# Patient Record
Sex: Male | Born: 1940
Health system: Southern US, Community
[De-identification: ages and names within clinical notes are randomized; demographics above are authoritative.]

## PROBLEM LIST (undated history)

## (undated) DIAGNOSIS — I4891 Unspecified atrial fibrillation: Secondary | ICD-10-CM

## (undated) DIAGNOSIS — J449 Chronic obstructive pulmonary disease, unspecified: Secondary | ICD-10-CM

## (undated) DIAGNOSIS — E785 Hyperlipidemia, unspecified: Secondary | ICD-10-CM

## (undated) DIAGNOSIS — I2510 Atherosclerotic heart disease of native coronary artery without angina pectoris: Secondary | ICD-10-CM

## (undated) DIAGNOSIS — N200 Calculus of kidney: Secondary | ICD-10-CM

## (undated) DIAGNOSIS — R251 Tremor, unspecified: Secondary | ICD-10-CM

## (undated) DIAGNOSIS — I6529 Occlusion and stenosis of unspecified carotid artery: Secondary | ICD-10-CM

## (undated) DIAGNOSIS — Z87891 Personal history of nicotine dependence: Secondary | ICD-10-CM

## (undated) DIAGNOSIS — D649 Anemia, unspecified: Secondary | ICD-10-CM

## (undated) DIAGNOSIS — I509 Heart failure, unspecified: Secondary | ICD-10-CM

## (undated) HISTORY — DX: Hyperlipidemia, unspecified: E78.5

## (undated) HISTORY — DX: Atherosclerotic heart disease of native coronary artery without angina pectoris: I25.10

## (undated) HISTORY — PX: TONSILLECTOMY: SUR1361

## (undated) HISTORY — DX: Unspecified atrial fibrillation: I48.91

## (undated) HISTORY — DX: Occlusion and stenosis of unspecified carotid artery: I65.29

## (undated) HISTORY — DX: Heart failure, unspecified: I50.9

## (undated) HISTORY — DX: Personal history of nicotine dependence: Z87.891

## (undated) HISTORY — DX: Anemia, unspecified: D64.9

## (undated) HISTORY — DX: Chronic obstructive pulmonary disease, unspecified: J44.9

## (undated) HISTORY — PX: OTHER SURGICAL HISTORY: SHX169

## (undated) HISTORY — DX: Tremor, unspecified: R25.1

## (undated) HISTORY — DX: Calculus of kidney: N20.0

---

## 2000-02-25 DIAGNOSIS — I509 Heart failure, unspecified: Secondary | ICD-10-CM

## 2000-02-25 HISTORY — DX: Heart failure, unspecified: I50.9

## 2000-12-08 ENCOUNTER — Encounter: Payer: Self-pay | Admitting: Emergency Medicine

## 2000-12-08 ENCOUNTER — Inpatient Hospital Stay (HOSPITAL_COMMUNITY): Admission: EM | Admit: 2000-12-08 | Discharge: 2000-12-14 | Payer: Self-pay | Admitting: *Deleted

## 2003-07-27 ENCOUNTER — Ambulatory Visit (HOSPITAL_COMMUNITY): Admission: RE | Admit: 2003-07-27 | Discharge: 2003-07-27 | Payer: Self-pay | Admitting: Gastroenterology

## 2004-01-03 ENCOUNTER — Ambulatory Visit: Payer: Self-pay | Admitting: *Deleted

## 2004-02-21 ENCOUNTER — Ambulatory Visit: Payer: Self-pay | Admitting: Internal Medicine

## 2004-03-06 ENCOUNTER — Ambulatory Visit: Payer: Self-pay | Admitting: Family Medicine

## 2004-04-11 ENCOUNTER — Ambulatory Visit: Payer: Self-pay | Admitting: *Deleted

## 2004-05-09 ENCOUNTER — Ambulatory Visit: Payer: Self-pay | Admitting: Cardiology

## 2004-05-23 ENCOUNTER — Ambulatory Visit: Payer: Self-pay | Admitting: Internal Medicine

## 2004-06-10 ENCOUNTER — Ambulatory Visit: Payer: Self-pay | Admitting: *Deleted

## 2004-06-13 ENCOUNTER — Ambulatory Visit: Payer: Self-pay | Admitting: Cardiology

## 2004-07-01 ENCOUNTER — Ambulatory Visit: Payer: Self-pay | Admitting: *Deleted

## 2004-07-25 ENCOUNTER — Ambulatory Visit: Payer: Self-pay | Admitting: Internal Medicine

## 2004-07-25 ENCOUNTER — Ambulatory Visit: Payer: Self-pay

## 2004-08-15 ENCOUNTER — Ambulatory Visit: Payer: Self-pay | Admitting: Family Medicine

## 2004-08-22 ENCOUNTER — Ambulatory Visit: Payer: Self-pay | Admitting: Cardiology

## 2004-10-24 ENCOUNTER — Ambulatory Visit: Payer: Self-pay | Admitting: Internal Medicine

## 2004-11-21 ENCOUNTER — Ambulatory Visit: Payer: Self-pay | Admitting: Cardiology

## 2004-12-19 ENCOUNTER — Ambulatory Visit: Payer: Self-pay | Admitting: Cardiology

## 2005-01-28 ENCOUNTER — Ambulatory Visit: Payer: Self-pay | Admitting: Cardiovascular Disease

## 2005-02-13 ENCOUNTER — Ambulatory Visit: Payer: Self-pay | Admitting: Cardiology

## 2005-03-13 ENCOUNTER — Ambulatory Visit: Payer: Self-pay | Admitting: Cardiology

## 2005-04-10 ENCOUNTER — Ambulatory Visit: Payer: Self-pay | Admitting: Cardiology

## 2005-05-08 ENCOUNTER — Ambulatory Visit: Payer: Self-pay | Admitting: Cardiology

## 2005-06-01 ENCOUNTER — Emergency Department (HOSPITAL_COMMUNITY): Admission: EM | Admit: 2005-06-01 | Discharge: 2005-06-01 | Payer: Self-pay | Admitting: Emergency Medicine

## 2005-06-05 ENCOUNTER — Ambulatory Visit: Payer: Self-pay | Admitting: Cardiology

## 2005-07-07 ENCOUNTER — Ambulatory Visit: Payer: Self-pay | Admitting: Cardiology

## 2005-08-04 ENCOUNTER — Ambulatory Visit: Payer: Self-pay | Admitting: Cardiology

## 2005-09-03 ENCOUNTER — Ambulatory Visit: Payer: Self-pay | Admitting: Cardiology

## 2005-09-04 ENCOUNTER — Ambulatory Visit: Payer: Self-pay | Admitting: Cardiology

## 2005-09-30 ENCOUNTER — Ambulatory Visit: Payer: Self-pay | Admitting: Cardiology

## 2005-09-30 ENCOUNTER — Ambulatory Visit: Payer: Self-pay

## 2005-10-07 ENCOUNTER — Ambulatory Visit: Payer: Self-pay

## 2005-10-07 ENCOUNTER — Ambulatory Visit: Payer: Self-pay | Admitting: Cardiology

## 2005-10-07 ENCOUNTER — Encounter: Payer: Self-pay | Admitting: Cardiology

## 2005-10-21 ENCOUNTER — Encounter (INDEPENDENT_AMBULATORY_CARE_PROVIDER_SITE_OTHER): Payer: Self-pay | Admitting: *Deleted

## 2005-10-21 ENCOUNTER — Inpatient Hospital Stay (HOSPITAL_COMMUNITY): Admission: RE | Admit: 2005-10-21 | Discharge: 2005-10-22 | Payer: Self-pay | Admitting: Vascular Surgery

## 2005-10-21 HISTORY — PX: CAROTID ENDARTERECTOMY: SUR193

## 2005-11-10 ENCOUNTER — Ambulatory Visit: Payer: Self-pay | Admitting: Cardiology

## 2005-12-08 ENCOUNTER — Ambulatory Visit: Payer: Self-pay | Admitting: Cardiology

## 2006-01-05 ENCOUNTER — Ambulatory Visit: Payer: Self-pay | Admitting: Cardiovascular Disease

## 2006-02-02 ENCOUNTER — Ambulatory Visit: Payer: Self-pay | Admitting: Cardiology

## 2006-03-02 ENCOUNTER — Ambulatory Visit: Payer: Self-pay | Admitting: Cardiology

## 2006-03-27 ENCOUNTER — Ambulatory Visit: Payer: Self-pay | Admitting: Family Medicine

## 2006-04-08 ENCOUNTER — Ambulatory Visit: Payer: Self-pay

## 2006-04-08 ENCOUNTER — Ambulatory Visit: Payer: Self-pay | Admitting: *Deleted

## 2006-04-10 ENCOUNTER — Ambulatory Visit: Payer: Self-pay

## 2006-05-06 ENCOUNTER — Ambulatory Visit: Payer: Self-pay | Admitting: Cardiology

## 2006-06-03 ENCOUNTER — Ambulatory Visit: Payer: Self-pay | Admitting: Internal Medicine

## 2006-07-01 ENCOUNTER — Ambulatory Visit: Payer: Self-pay | Admitting: *Deleted

## 2006-07-29 ENCOUNTER — Ambulatory Visit: Payer: Self-pay | Admitting: Cardiology

## 2006-08-26 ENCOUNTER — Ambulatory Visit: Payer: Self-pay | Admitting: Cardiology

## 2006-09-23 ENCOUNTER — Ambulatory Visit: Payer: Self-pay | Admitting: Cardiology

## 2006-10-09 ENCOUNTER — Ambulatory Visit: Payer: Self-pay

## 2006-10-21 ENCOUNTER — Ambulatory Visit: Payer: Self-pay | Admitting: Cardiology

## 2006-11-18 ENCOUNTER — Ambulatory Visit: Payer: Self-pay | Admitting: Internal Medicine

## 2006-12-16 ENCOUNTER — Ambulatory Visit: Payer: Self-pay | Admitting: Cardiology

## 2007-01-13 ENCOUNTER — Ambulatory Visit: Payer: Self-pay | Admitting: Cardiology

## 2007-02-10 ENCOUNTER — Ambulatory Visit: Payer: Self-pay | Admitting: Cardiovascular Disease

## 2007-04-06 ENCOUNTER — Ambulatory Visit: Payer: Self-pay | Admitting: Cardiovascular Disease

## 2007-04-12 ENCOUNTER — Ambulatory Visit: Payer: Self-pay

## 2007-04-12 ENCOUNTER — Ambulatory Visit: Payer: Self-pay | Admitting: Cardiology

## 2007-05-04 ENCOUNTER — Ambulatory Visit: Payer: Self-pay | Admitting: Cardiology

## 2007-06-01 ENCOUNTER — Ambulatory Visit: Payer: Self-pay | Admitting: Cardiology

## 2007-06-29 ENCOUNTER — Ambulatory Visit: Payer: Self-pay | Admitting: Cardiology

## 2007-07-27 ENCOUNTER — Ambulatory Visit: Payer: Self-pay | Admitting: Cardiology

## 2007-08-31 ENCOUNTER — Ambulatory Visit: Payer: Self-pay | Admitting: Cardiovascular Disease

## 2007-09-28 ENCOUNTER — Ambulatory Visit: Payer: Self-pay | Admitting: Cardiology

## 2007-10-12 ENCOUNTER — Ambulatory Visit: Payer: Self-pay

## 2007-10-12 ENCOUNTER — Ambulatory Visit: Payer: Self-pay | Admitting: Cardiology

## 2007-10-26 ENCOUNTER — Ambulatory Visit: Payer: Self-pay | Admitting: Cardiology

## 2007-11-23 ENCOUNTER — Ambulatory Visit: Payer: Self-pay | Admitting: Cardiology

## 2007-12-21 ENCOUNTER — Ambulatory Visit: Payer: Self-pay | Admitting: Cardiology

## 2008-01-18 ENCOUNTER — Ambulatory Visit: Payer: Self-pay | Admitting: Cardiology

## 2008-02-15 ENCOUNTER — Ambulatory Visit: Payer: Self-pay | Admitting: Internal Medicine

## 2008-03-14 ENCOUNTER — Ambulatory Visit: Payer: Self-pay | Admitting: Cardiology

## 2008-04-03 ENCOUNTER — Encounter: Payer: Self-pay | Admitting: Cardiology

## 2008-04-03 ENCOUNTER — Ambulatory Visit: Payer: Self-pay

## 2008-04-11 ENCOUNTER — Ambulatory Visit: Payer: Self-pay | Admitting: Cardiology

## 2008-05-09 ENCOUNTER — Ambulatory Visit: Payer: Self-pay | Admitting: Internal Medicine

## 2008-06-06 ENCOUNTER — Ambulatory Visit: Payer: Self-pay | Admitting: Cardiology

## 2008-07-04 ENCOUNTER — Ambulatory Visit: Payer: Self-pay | Admitting: Cardiology

## 2008-07-25 ENCOUNTER — Encounter: Payer: Self-pay | Admitting: *Deleted

## 2008-08-01 ENCOUNTER — Ambulatory Visit: Payer: Self-pay | Admitting: Cardiology

## 2008-08-01 LAB — CONVERTED CEMR LAB
POC INR: 1.4
Protime: 14.5

## 2008-08-29 ENCOUNTER — Encounter: Payer: Self-pay | Admitting: Cardiology

## 2008-08-29 ENCOUNTER — Ambulatory Visit: Payer: Self-pay | Admitting: Cardiology

## 2008-08-29 DIAGNOSIS — I4891 Unspecified atrial fibrillation: Secondary | ICD-10-CM | POA: Insufficient documentation

## 2008-08-29 LAB — CONVERTED CEMR LAB
POC INR: 1.7
Prothrombin Time: 16.2 s

## 2008-08-30 ENCOUNTER — Encounter: Payer: Self-pay | Admitting: *Deleted

## 2008-10-02 ENCOUNTER — Encounter: Payer: Self-pay | Admitting: Cardiology

## 2008-10-06 ENCOUNTER — Ambulatory Visit: Payer: Self-pay

## 2008-10-06 ENCOUNTER — Encounter: Payer: Self-pay | Admitting: Cardiology

## 2008-10-16 ENCOUNTER — Telehealth (INDEPENDENT_AMBULATORY_CARE_PROVIDER_SITE_OTHER): Payer: Self-pay | Admitting: *Deleted

## 2008-11-02 ENCOUNTER — Encounter: Payer: Self-pay | Admitting: Cardiology

## 2009-05-02 ENCOUNTER — Encounter: Payer: Self-pay | Admitting: Cardiology

## 2009-05-02 DIAGNOSIS — I6529 Occlusion and stenosis of unspecified carotid artery: Secondary | ICD-10-CM | POA: Insufficient documentation

## 2009-05-03 ENCOUNTER — Ambulatory Visit: Payer: Self-pay | Admitting: Cardiology

## 2009-05-03 ENCOUNTER — Ambulatory Visit: Payer: Self-pay

## 2009-05-03 DIAGNOSIS — I428 Other cardiomyopathies: Secondary | ICD-10-CM | POA: Insufficient documentation

## 2009-05-03 DIAGNOSIS — I080 Rheumatic disorders of both mitral and aortic valves: Secondary | ICD-10-CM

## 2009-05-04 ENCOUNTER — Encounter: Payer: Self-pay | Admitting: Cardiology

## 2009-10-25 ENCOUNTER — Encounter: Payer: Self-pay | Admitting: Cardiology

## 2009-10-26 ENCOUNTER — Encounter: Payer: Self-pay | Admitting: Cardiology

## 2009-10-26 ENCOUNTER — Ambulatory Visit: Payer: Self-pay

## 2010-01-02 ENCOUNTER — Telehealth (INDEPENDENT_AMBULATORY_CARE_PROVIDER_SITE_OTHER): Payer: Self-pay | Admitting: *Deleted

## 2010-03-26 NOTE — Assessment & Plan Note (Signed)
Summary: 1 yr rov   Medications Added COUMADIN 7.5 MG TABS (WARFARIN SODIUM) as directed WELCHOL 625 MG TABS (COLESEVELAM HCL) 2 tabs three times a day      Allergies Added:   Primary Provider:  Dr. Ardeen Garland   History of Present Illness: The presents her evaluation of his known cardiomyopathy with mitral regurgitation. This improved significantly with medical management. Since I last saw him he has had no new cardiovascular complaints. He continues to work and has no new shortness of breath, PND or orthopnea. He is in permanent atrial fibrillation but does not notice palpitation and tolerates Coumadin. He had a carotid Doppler today demonstrating stable 60-79% right stenosis status post left CEA. There is 0-39% left stenosis.  Current Medications (verified): 1)  Lanoxin 0.125 Mg Tabs (Digoxin) .Marland Kitchen.. 1 By Mouth Daily 2)  Zetia 10 Mg Tabs (Ezetimibe) .Marland Kitchen.. 1 By Mouth Daily 3)  Coumadin 7.5 Mg Tabs (Warfarin Sodium) .... As Directed 4)  Lisinopril 40 Mg Tabs (Lisinopril) .Marland Kitchen.. 1 By Mouth Two Times A Day 5)  Furosemide 20 Mg Tabs (Furosemide) .... Take 1/2 Tablet By Mouth Daily. 6)  Amlodipine Besylate 2.5 Mg Tabs (Amlodipine Besylate) .Marland Kitchen.. 1 By Mouth Daily 7)  Coreg 25 Mg Tabs (Carvedilol) .Marland Kitchen.. 1 By Mouth Two Times A Day 8)  Welchol 625 Mg Tabs (Colesevelam Hcl) .... 2 Tabs Three Times A Day  Allergies (verified): 1)  ! Penicillin  Past History:  Past Medical History:  Cardiomyopathy (his EF had been 35% in the past   that is improved to 55% echo 2/10), moderately severe mitral   regurgitation in the past, now improved, permanent atrial fibrillation,   nonobstructive coronary disease, diabetes mellitus, dyslipidemia, COPD,   prior tobacco abuse, nephrolithiasis, carotid artery stenosis as   described, tonsillectomy.      Past Surgical History:  Left carotid endarterectomy  Tonsillectomy  Review of Systems       As stated in the HPI and negative for all other  systems.   Vital Signs:  Patient profile:   70 year old male Height:      66 inches Weight:      183 pounds BMI:     29.64 Pulse rate:   95 / minute BP sitting:   119 / 64  (left arm) Cuff size:   regular  Vitals Entered By: Burnett Kanaris, CNA (May 03, 2009 1:43 PM)  Physical Exam  General:  Well developed, well nourished, in no acute distress. Head:  normocephalic and atraumatic Eyes:  PERRLA/EOM intact; conjunctiva and lids normal. Neck:  Neck supple, no JVD. No masses, thyromegaly or abnormal cervical nodes. Lungs:  Clear bilaterally to auscultation and percussion. Heart:  Non-displaced PMI, chest non-tender; regular rate and rhythm, S1, S2 with 2/6 apical systolic murmur nonradiating, no diastolic murmurs, rubs or gallops. Carotid upstroke normalt. Normal abdominal aortic size, no bruits. Femorals normal pulses, no bruits. Pedals normal pulses. No edema, no varicosities. Abdomen:  Bowel sounds positive; abdomen soft and non-tender without masses, organomegaly, or hernias noted. No hepatosplenomegaly. Msk:  Back normal, normal gait. Muscle strength and tone normal. Extremities:  No clubbing or cyanosis. Neurologic:  Alert and oriented x 3. Psych:  Normal affect.   EKG  Procedure date:  05/03/2009  Findings:      atrial fibrillation, axis within normal limits, intervals within normal limits, no acute ST-T wave changes, poor anterior R wave progression.  Impression & Recommendations:  Problem # 1:  ATRIAL FIBRILLATION (ICD-427.31) He tolerates  this rhythm with anticoagulation and rate control. No change in therapy is indicated. Orders: EKG w/ Interpretation (93000)  Problem # 2:  CARDIOMYOPATHY (ICD-425.4) I would have no reason to suspect a lower ejection fraction. He will continue the meds as listed.  Problem # 3:  MITRAL REGURGITATION (ICD-396.3) His MR improved with medical management and is not evident on physical exam. He has no symptoms. No further testing is  indicated.  Problem # 4:  CAROTID ARTERY DISEASE (ICD-433.10) He will have this followed again in 6 months. We reviewed this result today. Orders: Carotid Duplex (Carotid Duplex)  Patient Instructions: 1)  Your physician wants you to follow-up in: 1 year.  You will receive a reminder letter in the mail two months in advance. If you don't receive a letter, please call our office to schedule the follow-up appointment. 2)  You will need a repeat carotid ultrasound in 6 months. We will contact you to schedule this.

## 2010-03-26 NOTE — Progress Notes (Signed)
   Walk in Patient Form Recieved " pt needs Prescription Forms Completed" sent to Message Nurse [facility]  January 02, 2010 9:11 AM

## 2010-03-26 NOTE — Miscellaneous (Signed)
Summary: Orders Update  Clinical Lists Changes  Orders: Added new Test order of Carotid Duplex (Carotid Duplex) - Signed 

## 2010-03-26 NOTE — Miscellaneous (Signed)
Summary: Orders Update  Clinical Lists Changes  Problems: Added new problem of CAROTID ARTERY DISEASE (ICD-433.10) Orders: Added new Test order of Carotid Duplex (Carotid Duplex) - Signed 

## 2010-06-18 ENCOUNTER — Encounter: Payer: Self-pay | Admitting: Cardiology

## 2010-06-18 ENCOUNTER — Other Ambulatory Visit: Payer: Self-pay | Admitting: Cardiology

## 2010-06-18 DIAGNOSIS — I6529 Occlusion and stenosis of unspecified carotid artery: Secondary | ICD-10-CM

## 2010-06-19 ENCOUNTER — Ambulatory Visit (INDEPENDENT_AMBULATORY_CARE_PROVIDER_SITE_OTHER): Payer: Medicare Other | Admitting: Cardiology

## 2010-06-19 ENCOUNTER — Encounter (INDEPENDENT_AMBULATORY_CARE_PROVIDER_SITE_OTHER): Payer: Medicare Other | Admitting: Cardiology

## 2010-06-19 ENCOUNTER — Encounter: Payer: Self-pay | Admitting: Cardiology

## 2010-06-19 VITALS — BP 120/78 | HR 92 | Ht 66.0 in | Wt 190.0 lb

## 2010-06-19 DIAGNOSIS — I428 Other cardiomyopathies: Secondary | ICD-10-CM

## 2010-06-19 DIAGNOSIS — I6529 Occlusion and stenosis of unspecified carotid artery: Secondary | ICD-10-CM

## 2010-06-19 DIAGNOSIS — I4891 Unspecified atrial fibrillation: Secondary | ICD-10-CM

## 2010-06-19 DIAGNOSIS — I080 Rheumatic disorders of both mitral and aortic valves: Secondary | ICD-10-CM

## 2010-06-19 NOTE — Assessment & Plan Note (Signed)
This was checked today and I do not have the final results will followup as indicated. He will continue with risk reduction.

## 2010-06-19 NOTE — Progress Notes (Signed)
HPI The patient returns for followup of atrial fibrillation. Since I last saw him he has had no new complaints. He denies any palpitations, presyncope or syncope. He has had no chest pressure, neck or arm discomfort. He has had no new shortness of breath, PND or anterior he has had a 7 pound weight gain since he retired but he has no edema.  Allergies  Allergen Reactions  . Penicillins   . Statins     Current Outpatient Prescriptions  Medication Sig Dispense Refill  . amLODipine (NORVASC) 2.5 MG tablet Take 2.5 mg by mouth daily.        . carvedilol (COREG) 25 MG tablet Take 25 mg by mouth 2 (two) times daily with a meal.        . colesevelam (WELCHOL) 625 MG tablet Take 1,875 mg by mouth 3 (three) times daily.       . digoxin (LANOXIN) 0.125 MG tablet Take 125 mcg by mouth daily.        Marland Kitchen ezetimibe (ZETIA) 10 MG tablet Take 10 mg by mouth daily.        . furosemide (LASIX) 20 MG tablet Take by mouth daily. 1/2 tablet po      . glipiZIDE (GLUCOTROL) 10 MG tablet Take 10 mg by mouth daily.        Marland Kitchen lisinopril (PRINIVIL,ZESTRIL) 40 MG tablet Take 40 mg by mouth 2 (two) times daily.        . saxagliptin HCl (ONGLYZA) 2.5 MG TABS tablet Take by mouth daily.        Marland Kitchen warfarin (COUMADIN) 7.5 MG tablet Take 7.5 mg by mouth daily.          Past Medical History  Diagnosis Date  . Cardiomyopathy     EF 35% in the past--Improved to 55% echo 2/10. Moderately severe mitral regurgitation in the past, now improved,  . Atrial fibrillation     permanent  . Coronary artery disease     non obstructive  . Diabetes mellitus   . Dyslipidemia   . COPD (chronic obstructive pulmonary disease)   . History of tobacco abuse   . Nephrolithiasis   . Carotid artery stenosis     Past Surgical History  Procedure Date  . Tonsillectomy   . Left carotid endarterectomy     ROS:  As stated in the HPI and negative for all other systems.  PHYSICAL EXAM BP 120/78  Pulse 92  Ht 5\' 6"  (1.676 m)  Wt 190 lb  (86.183 kg)  BMI 30.67 kg/m2 GENERAL:  Well appearing HEENT:  Pupils equal round and reactive, fundi not visualized, oral mucosa unremarkable, dentures NECK:  No jugular venous distention, waveform within normal limits, carotid upstroke brisk and symmetric, no bruits, no thyromegaly LYMPHATICS:  No cervical, inguinal adenopathy LUNGS:  Clear to auscultation bilaterally BACK:  No CVA tenderness CHEST:  Unremarkable HEART:  PMI not displaced or sustained,S1 and S2 within normal limits, no S3,  no clicks, no rubs, no murmurs, irregular ABD:  Flat, positive bowel sounds normal in frequency in pitch, no bruits, no rebound, no guarding, no midline pulsatile mass, no hepatomegaly, no splenomegaly EXT:  2 plus pulses throughout, no edema, no cyanosis no clubbing SKIN:  No rashes no nodules NEURO:  Cranial nerves II through XII grossly intact, motor grossly intact throughout PSYCH:  Cognitively intact, oriented to person place and time   EKG:  Atrial fibrillation, axis within normal limits, intervals within normal limits, no acute ST-T wave changes  ASSESSMENT AND PLAN

## 2010-06-19 NOTE — Assessment & Plan Note (Signed)
He has no symptoms related to this. We will continue with rate control and anticoagulation.

## 2010-06-19 NOTE — Assessment & Plan Note (Signed)
His previous low EF and is moderate mitral regurgitation better on the last echo. I would not suggest any change in does not need further imaging.

## 2010-06-19 NOTE — Patient Instructions (Signed)
Follow up in 1 year with Dr Antoine Poche Continue current medications Repeat carotid doppler in 6 months

## 2010-06-24 ENCOUNTER — Encounter: Payer: Self-pay | Admitting: Cardiology

## 2010-06-24 NOTE — Progress Notes (Signed)
Pt aware of results and to repeat in 6 months 

## 2010-07-09 NOTE — Assessment & Plan Note (Signed)
[facility]                            CARDIOLOGY OFFICE NOTE   PARNELL, SPIELER                      MRN:          [id number]  DATE:04/12/2007                            DOB:          18-Sep-1940    PRIMARY CARE PHYSICIAN:  Delaney Meigs, M.D.   REASON FOR PRESENTATION:  Evaluate patient with previous cardiomyopathy  and known carotid stenosis.   HISTORY OF PRESENT ILLNESS:  The patient is a pleasant 70 year old  gentleman with cardiomyopathy, nonischemic.  His ejection fraction had  been in the low 30s in the past but had improved to normal with medical  management.  He has permanent atrial fibrillation.  He was here today  for a carotid ultrasound.  He has had left carotid endarterectomy and  has right nonobstructive disease as described.  He is doing quite well  since I last saw him.  He has not been having any chest discomfort, neck  or arm discomfort.  He has not been having palpitations, no presyncope  or syncope.  He has been having none of the shortness of breath that he  had previously.  He has had no PND or orthopnea.  He is trying to stay  active and actually just got a treadmill.  He continues to work 5 days a  week.   PAST MEDICAL HISTORY:  1. Nonischemic cardiomyopathy as described above.  2. Mitral regurgitation (previously severe but mild on the last echo).  3. Nonobstructive coronary disease.  4. Chronic atrial fibrillation.  5. Diabetes mellitus.  6. Hyperlipidemia.  7. COPD with prior tobacco abuse.  8. Kidney stones.  9. Carotid artery stenosis (severe left stenosis status post      endarterectomy and Dacron patch angioplasty in 2007.  The most      recent Doppler done today demonstrates 60-79% right stenosis and 0      to 39% left stenosis).  10.Tonsillectomy.   ALLERGIES:  PENICILLIN.   MEDICATIONS:  1. Digoxin 0.125 mg daily.  2. Coumadin.  3. Lasix 10 mg daily.  4. Coreg 25 mg b.i.d.  5. Multivitamin.  6. Zetia 10 mg q.h.s.  7. Lisinopril 40 mg b.i.d.  8. Glucophage 500 mg q.h.s.  9. Glipizide 5 mg q.a.m.  10.Welchol 625 mg daily.  11.Lipitor (dose not clear).   REVIEW OF SYSTEMS:  As stated in the HPI and otherwise negative for  other systems.   PHYSICAL EXAMINATION:  GENERAL:  The patient is in no distress.  VITAL SIGNS:  Blood pressure 165/85 (repeated and verified), heart rate  83 and irregular, weight 181 pounds.  HEENT:  Eyes unremarkable.  Pupils equal, round, and reactive to light.  Fundi not visualized.  Oral mucosa unremarkable.  NECK:  No jugular distention at 45 degrees.  Carotid upstroke brisk and  symmetrical.  No bruits, no thyromegaly.  LYMPHATICS:  No lymphadenopathy.  LUNGS:  Clear to auscultation bilaterally.  BACK:  No costovertebral angle tenderness.  CHEST:  Unremarkable.  HEART:  PMI not displaced or sustained.  S1-S2 within normal limits.  No  S3, no murmurs.  ABDOMEN:  Mildly obese.  Positive bowel sounds.  Normal in frequency and  pitch.  No bruits, no rebound, no guarding.  No midline pulsatile mass.  No hepatomegaly, no splenomegaly.  SKIN:  No rashes, no nodules.  EXTREMITIES:  2+ pulses throughout.  No edema, no cyanosis, no clubbing.  NEUROLOGIC:  Oriented to person, place and time.  Cranial nerves II-XII  grossly intact, motor grossly intact.   EMERGENCY DEPARTMENT COURSE:  Atrial fibrillation, rate 89.  Axis within  normal limits.  Intervals within normal limits.  Nonspecific T-wave  changes consistent with the digoxin effect.  No acute ST-T wave changes.   ASSESSMENT/PLAN:  1. Hypertension.  Blood pressure is elevated.  I repeated and verified      this.  It was slightly elevated at the last visit.  Therefore, I am      going to add Norvasc 2.5 mg daily.  He also needs therapeutic      lifestyle changes.  2. Atrial fibrillation.  He is tolerating Coumadin.  He has good rate      control.  He is not noticing this.  No further therapy is       warranted.  3. Nonischemic cardiomyopathy.  His ejection fraction had improved on      an August 2007 echo.  I would have no reason to believe it was      worse.  No further evaluation is warranted.  4. Mitral regurgitation.  This was mild at last check, and I do not      hear it, so would not repeat a study.  5. Carotid stenosis.  He had a Doppler today and will have another one      again in 6 months to follow this.  6. Diabetes mellitus per Dr. Lysbeth Galas.  7. Risk reduction.  He is having his lipids followed by Dr. Lysbeth Galas.   FOLLOWUP:  I will see him again in 6 months or sooner if needed.     Rollene Rotunda, MD, [facility]  Electronically Signed    JH/MedQ  DD: 04/12/2007  DT: 04/13/2007  Job #: [id number]   cc:   Delaney Meigs, M.D.

## 2010-07-09 NOTE — Assessment & Plan Note (Signed)
[facility]                            CARDIOLOGY OFFICE NOTE   Oscar, [name]                      MRN:          [id number]  DATE:10/12/2007                            DOB:          03-15-1940    PRIMARY CARE PHYSICIAN:  Oscar [name], M.D.   REASON FOR PRESENTATION:  Evaluate the patient with coronary artery  disease, cardiomyopathy, peripheral vascular disease, and mitral  regurgitation.   HISTORY OF PRESENT ILLNESS:  The patient is now a pleasant 71 year old  gentleman with the above problems.  He comes for 48-month followup.  He  had carotid Dopplers, which preliminary demonstrate no significant  change from previous.  He had these today.  He has no new cardiovascular  complaints.  He does usual activities, though he is not exercising  routinely.  With his daily activities, he denies any chest pressure,  neck discomfort, arm discomfort, activity-induced nausea, vomiting, or  excessive diaphoresis.  He may occasionally feel some palpitations, but  has had no presyncope or syncope.  He denies any new shortness of breath  and has no PND or orthopnea.   PAST MEDICAL HISTORY:  Nonischemic cardiomyopathy (his EF had been in  the low 30s in the past.  The most recent echocardiogram in 2007  suggested that his EF was overall normal), mitral regurgitation (severe  in the past, but listed as mild at the last echocardiogram), persistent  atrial fibrillation, nonobstructive coronary artery disease, diabetes  mellitus, hyperlipidemia, COPD, with prior tobacco abuse, kidney stones,  carotid artery stenosis (60-79% right stenosis, 0-39% left stenosis),  and tonsillectomy.   ALLERGIES:  PENICILLIN.   MEDICATIONS:  1. Digoxin 0.125 mg daily.  2. Coumadin.  3. Lasix 10 mg daily.  4. Coreg 25 mg b.i.d.  5. Multivitamin.  6. Zetia 10 mg at bedtime.  7. Lisinopril 40 mg b.i.d.  8. Glipizide 5 mg q.a.m.  9. WelChol 625 mg daily.  10.Norvasc 2.5  mg daily.  11.Glucophage 1000 mg at bedtime.   REVIEW OF SYSTEMS:  As stated in the HPI and negative for all other  systems.   PHYSICAL EXAMINATION:  GENERAL:  The patient is in no distress.  VITAL SIGNS:  Blood 144/94, heart rate 67 and irregular, weight 188  pounds (up 7 pounds).  HEENT:  Eyes unremarkable.  Pupils, equal, round, and reactive to light.  Fundi not visualized.  Oral mucosa unremarkable.  NECK:  No jugular venous distention at 45 degrees, carotid upstroke  brisk and symmetric, no bruits, no thyromegaly.  LYMPHATICS:  No cervical, axillary, or inguinal adenopathy.  LUNGS:  Clear to auscultation bilaterally.  BACK:  No costovertebral angle tenderness.  CHEST:  Unremarkable.  HEART:  PMI nondisplaced or sustained.  S1 and S2 within normal limits.  No S3, no murmurs.  ABDOMEN:  Obese.  Positive bowel sounds.  Normal in frequency and pitch.  No bruits, no rebound, no guarding, no midline pulsatile mass, no  hepatomegaly, no splenomegaly.  SKIN:  No rashes, no nodules.  EXTREMITIES:  2+ pulses throughout, no edema, no cyanosis, no clubbing.  NEURO:  Oriented  to person, place, and time.  Cranial nerves II-XII are  grossly intact, motor grossly intact.   EKG, atrial fibrillation, rate 71, axes within normal limits, intervals  within normal limits, no acute ST-T waves changes.   ASSESSMENT AND PLAN:  1. Atrial fibrillation.  The patient is tolerating this rhythm.  He      has had reasonable rate control.  He tolerates the anticoagulation.      At this point, no further cardiovascular testing is suggested.  2. Mitral regurgitation.  I will assess this with a repeat      echocardiogram as it has been about 2 years.  We will do this the      next time he comes back in 6 months.  I do not hear a significant      murmur at this point.  3. Cardiomyopathy.  He will remain on the medicines as listed.  Again,      we will assess this with the echocardiogram when he comes back in  6      months.  4. Carotid stenosis.  He is having this followed.  He is due to have      this again in 6 months given the severity.  He will get all 3      studies mentioned when he comes back.  5. Dyslipidemia, per Dr. Lysbeth [name], which suggest a goal LDL less than      100 and HDL greater than 40.  6. Followup.  I will see the patient again in 6 months after the above      testing or sooner if he has any problems.     Oscar Rotunda, MD, [facility]  Electronically Signed    JH/MedQ  DD: 10/12/2007  DT: 10/13/2007  Job #: [id number]   cc:   Oscar [name], M.D.

## 2010-07-09 NOTE — Assessment & Plan Note (Signed)
[facility]                            CARDIOLOGY OFFICE NOTE   Oscar [name], Oscar [name]                      MRN:          [id number]  DATE:04/11/2008                            DOB:          [birth year]    PRIMARY CARE PHYSICIAN:  Delaney Meigs, MD   REASON FOR PRESENTATION:  Evaluate the patient with coronary disease,  cardiomyopathy, peripheral vascular disease, and mitral regurgitation.   HISTORY OF PRESENT ILLNESS:  The patient is 70 years old.  He presents  for 2-month followup.  Since I last saw him, he has done well.  He has  gained a few pounds as he had to remove the treadmill from his house  since he has grandchildren living with him.  He has not been as active.  He is still active on the job, however.  With this, he denies any chest  discomfort, neck or arm discomfort.  He is not noticing any  palpitations, presyncope, or syncope.  He has had no PND or orthopnea.  He did have an echocardiogram done last month that demonstrated the EF  was well preserved at 55-60%.  There was only mild mitral regurgitation.  He had carotid Dopplers demonstrating stable 60-79% right stenosis and 0-  39% left stenosis status post CEA.  This seems to be stable.   PAST MEDICAL HISTORY:  Cardiomyopathy (his EF had been 35% in the past  that is improved as described above), moderately severe mitral  regurgitation in the past, now improved, permanent atrial fibrillation,  nonobstructive coronary disease, diabetes mellitus, dyslipidemia, COPD,  prior tobacco abuse, nephrolithiasis, carotid artery stenosis as  described, tonsillectomy.   ALLERGIES:  PENICILLIN.   MEDICATIONS:  1. Digoxin 0.125 mg daily.  2. Coumadin.  3. Lasix 10 mg daily.  4. Coreg 25 mg b.i.d.  5. Multivitamin.  6. Zetia 10 mg nightly.  7. Lisinopril 40 mg b.i.d.  8. Glipizide 5 mg q.a.m.  9. WelChol.  10.Norvasc 2.5 mg daily.  11.Glucophage 1000 mg nightly.   REVIEW OF SYSTEMS:  As  stated in the HPI and otherwise, negative for all  other systems.   PHYSICAL EXAMINATION:  GENERAL:  The patient is in no distress.  VITAL SIGNS:  Blood pressure 146/76, heart rate 87 and irregular, weight  186 pounds, body mass index 30.  HEENT:  Eyes unremarkable.  Pupils equal, round, and reactive to light.  Fundi visualized.  Oral mucosa unremarkable.  NECK:  No jugular venous distension at 45 degrees.  Carotid upstroke  brisk and symmetrical.  No bruits.  No thyromegaly.  LYMPHATICS:  No cervical, axillary, or inguinal adenopathy.  LUNGS:  Clear to auscultation bilaterally.  BACK:  No costovertebral angle tenderness.  CHEST:  Unremarkable.  HEART:  PMI not displaced or sustained.  S1 and S2 within normal limits.  No S3, no clicks, no rubs, no murmurs.  ABDOMEN:  Obese, positive bowel sounds, normal in frequency and pitch.  No bruits, no rebound, no guarding.  No midline pulsatile mass.  No  organomegaly.  SKIN:  No rashes, no nodules.  EXTREMITIES:  2+ pulses,  no edema.   EKG, atrial fibrillation, rate 87, axis within normal limits, intervals  within normal limits, no acute ST-T wave changes.   ASSESSMENT AND PLAN:  1. Atrial fibrillation.  The patient is doing well with respect to      this.  He has good rate control.  He is tolerating Coumadin.  No      further change to his regimen, his plan.  2. Cardiomyopathy.  This improved with medical management.  No further      change in his therapy is warranted.  3. Obesity.  He understands the need to lose weight with diet and      exercise and I reinforce this.  4. Mitral regurgitation.  This is very mild at most.  It was      moderately severe to severe in the past.  This can continue to be      followed.  5. Diabetes per Dr. Lysbeth Galas.  6. Dyslipidemia per Dr. Lysbeth Galas with goal LDL less than 100 and HDL      greater than 40.  7. Hypertension.  His blood pressure is very slightly elevated today.      He has been with  interrogated multiple visits in the past.  I have      asked him to lose 10 pounds.  He needs to have his blood pressure      followed closely and if he does not come back down to 140/90 or      less with the weight less, he needs to have med adjustment which      could be the increase of his Norvasc.  8. Followup.  I will see him in one year or sooner if needed.     Rollene Rotunda, MD, [facility]  Electronically Signed    JH/MedQ  DD: 04/11/2008  DT: 04/12/2008  Job #: [id number]   cc:   Delaney Meigs, M.D.

## 2010-07-12 NOTE — Assessment & Plan Note (Signed)
[facility]                              CARDIOLOGY OFFICE NOTE   TYCEN, DOCKTER                      MRN:          [id number]  DATE:10/07/2005                            DOB:          September 17, 1940    REFERRING PHYSICIAN:  Angelina Sheriff, MD   PRIMARY CARE PHYSICIAN:  Delaney Meigs, MD   REASON FOR VISIT:  Asymptomatic carotid stenosis.   HISTORY OF PRESENT ILLNESS:  Oscar [name] is a 70 year old gentleman with  nonobstructive coronary disease superimposed upon a prior nonischemic  cardiomyopathy.  Ejection fraction has previously been as low at 35%.  As of  June 2006, it had improved to normal.  Repeat echocardiogram today  demonstrates persistent normalization.   Oscar [name] was recently found to have severe left internal carotid artery  stenosis, as detailed below.  He denies any angina and any exertional  dyspnea.  He has no orthopnea, PND, or edema.  He maintains an active  lifestyle working as a Consulting civil engineer at [employer].   PAST MEDICAL HISTORY:  1. History of nonischemic cardiomyopathy with improvement in EF to normal.  2. Mitral regurgitation which was previously severe, but is now mild.  3. Nonobstructive atherosclerotic coronary disease.  4. Chronic atrial fibrillation.  5. Diabetes mellitus.  6. Hypercholesterolemia.  7. COPD with prior tobacco abuse.  8. Status post tonsillectomy.  9. Kidney stone in 1962.   ALLERGIES:  PENICILLIN.   CURRENT MEDICATIONS:  1. Digoxin 0.125 mg per day.  2. Metformin 500 mg per day.  3. Coumadin 7.5 mg per day.  4. Lasix 10 mg per day.  5. Coreg 25 mg twice per day.  6. Multivitamin one per day.  7. Zetia 10 mg per day.  8. Lisinopril 40 mg twice per day.  9. Albuterol p.r.n.   SOCIAL HISTORY:  The patient is a Armed forces training and education officer.  He is married  with 5 children and 12 grandchildren.  He previously smokes, but quit in  2003.  He denies alcohol and illicit drug use.   FAMILY HISTORY:  Father died of pneumonia at 86.  Mother died at 96 of cause  unclear to the patient.  He had 6 siblings.  Two sisters died in their 75s  of what he thinks were heart attacks.  Three other siblings have coronary  disease and a sister has breast cancer.  His 5 children are all alive and  well.   REVIEW OF SYSTEMS:  Remarkable for wearing glasses and dentures.  Review of  systems is otherwise negative in detail, except as above.   PHYSICAL EXAMINATION:  GENERAL/VITAL SIGNS:  He is generally well-appearing,  in no distress with heart rate 72, blood pressure 132/74 on the right and  130/70 on the left.  He is 5-feet 6-inches tall and weighs 180 pounds.  HEENT:  Normal.  SKIN:  Exam is normal.  MUSCULOSKELETAL:  Exam is normal.  NECK:  He has no jugular venous distention, thyromegaly, or lymphadenopathy.  LUNGS:  Respiratory effort is normal.  Lungs are clear to auscultation.  CARDIAC:  He has a  nondisplaced point of maximal cardiac impulse.  There is  an irregularly irregular rhythm without murmur or S3.  ABDOMEN:  Soft, non-distended, and nontender.  There is no  hepatosplenomegaly.  Bowel sounds are normal.  EXTREMITIES:  Warm without clubbing, cyanosis, edema, or ulceration.  VASCULAR:  Carotid pulses are 2+ bilateral with a prominent bruit on the  left.  Femoral pulses 2+ bilaterally.  NEUROLOGIC:  He is alert and oriented x3.  Cranial nerves II-XII are intact.  Strength and sensation are normal in all 4 extremities.  Affect is normal  and language is normal.   ACCESSORY CLINICAL DATA:  Carotid duplex examination performed September 30, 2005 demonstrated 60% to 79% right internal carotid artery stenosis with  peak systolic velocity of 233 cm/sec in the proximal internal carotid.  On  the left, there is greater than 80% stenosis with a peak systolic velocity  of 522 cm/sec and a diastolic velocity of 175 cm/sec.   IMPRESSION/RECOMMENDATION:  Oscar [name] is a 70 year old  gentleman who has  asymptomatic severe left internal carotid artery stenosis.  While he does  have a history of a nonischemic cardiomyopathy, he has no current symptoms  of heart failure and his ejection fraction has improved to normal.  He does  have emphysema, but this is not severe.  He is therefore not at substantial  risk of cardiac or pulmonary complication for open carotid endarterectomy.  I have therefore taken the liberty of referring him to Vascular Surgery for  consideration of open endarterectomy for reduction in the risk of stroke.  I  have asked him to begin an aspirin in addition to his Coumadin.                                 Oscar Farber, MD  WED/MedQ  DD:  10/07/2005  DT:  10/08/2005  Job #:  [id number]   cc:   Delaney Meigs, MD  Rollene Rotunda, MD, [facility]

## 2010-07-12 NOTE — H&P (Signed)
NAME:  Oscar [name], Oscar [name] NO.:  [id number]   MEDICAL RECORD NO.:  [id number]          PATIENT TYPE:  INP   LOCATION:  NA                           FACILITY:  [facility]   PHYSICIAN:  Larina Earthly, M.D.    DATE OF BIRTH:  1940-10-04   DATE OF ADMISSION:  10/21/2005  DATE OF DISCHARGE:                                HISTORY & PHYSICAL   PRIMARY CARE PHYSICIAN:  Delaney Meigs, MD.   CARDIOLOGIST:  Rollene Rotunda, MD, [facility].   CHIEF COMPLAINT:  Left internal carotid artery stenosis.   HISTORY OF PRESENT ILLNESS:  This is a 70 year old Caucasian male who  presented for evaluation of asymptomatic severe carotid disease. The patient  presented to Dr. Antoine Poche for his annual exam at which time a carotid bruit  was heard. The patient then underwent carotid duplex which was reviewed by  Dr. Arbie Cookey and the patient was found to have a critical left carotid  stenosis. The patient denies headaches, nausea, vomiting, dizziness,  numbness, tingling, muscle weakness, dysarthria, dysphagia, visual changes  and all other TIA or CVA symptoms. His only complaint is of dyspnea on  exertion.   PAST MEDICAL HISTORY:  1. CHF with normal EF.  2. Diabetes mellitus.  3. Hypertension.  4. Renal stone in 1962.  5. Arthritis.  6. Mitral regurgitation.  7. Nonobstructive arteriosclerotic coronary disease.  8. Chronic atrial fibrillation.  9. Hypercholesterolemia.  10.Chronic obstructive pulmonary disease.   PAST SURGICAL HISTORY:  Tonsillectomy.   ALLERGIES:  PENICILLIN which causes a rash.   MEDICATIONS:  1. Digoxin 0.125 mg daily.  2. Lasix 10 mg daily.  3. Coreg 25 mg b.i.d.  4. Lisinopril 40 mg b.i.d.  5. Coumadin 7.5 mg daily, stopped on September 27, 2005.  6. Multivitamin daily.  7. Zetia 10 mg daily.  8. Aspirin 81 mg daily.  9. Metformin 500 mg daily.  10.Albuterol p.r.n.   REVIEW OF SYSTEMS:  Please see HPI for significant positives and negatives.   SOCIAL HISTORY:  This  is a married male with 5 children who lives with his  family. He quit smoking in 2002 after smoking for approximately 25 years. He  does not drink alcohol. He is employed in the Biochemist, clinical. He does  continue to drive and has assistance available after surgery.   FAMILY HISTORY:  Mother coronary disease, siblings coronary disease.   PHYSICAL EXAMINATION:  VITAL SIGNS:  Blood pressure 168/82 left arm sitting,  heart rate 68, respirations 18.  GENERAL:  This is a 70 year old, Caucasian male in no acute distress.  HEENT:  Normocephalic, atraumatic. Pupils equal round and reactive to light  and accommodation. Extraocular movements intact. The oral mucosa is pink and  moist. The sclera is nonicteric and the patient does have complete upper and  lower dentures.  NECK:  Supple with no JVD, no bruits and no lymphadenopathy. Carotids are  2+.  LUNGS:  Respirations are symmetric, unlabored and clear.  CARDIAC:  Irregularly irregular with no murmurs, rubs or gallops.  ABDOMEN:  Soft, nontender, nondistended with normal active bowel sounds.  GU/RECTAL:  Deferred.  EXTREMITIES:  There is no evidence of edema, varicosities, venous stasis  changes or skin breakdown. Temperature is warm.  PULSES:  Radial, femoral, popliteal and pedal pulses are 2+ bilaterally.  NEUROLOGIC:  Nonfocal. The patient is alert and oriented x3. The gait is  steady. Muscle strength is 5/5 throughout all extremities and symmetric.  Deep tendon reflexes are 2+.   ASSESSMENT:  Left internal carotid artery stenosis.   PLAN:  Left carotid endarterectomy by Dr. Arbie Cookey on October 21, 2005. Dr.  Arbie Cookey has seen and evaluated this patient prior to this admission and has  explained the risks and benefits of the procedure and the patient has agreed  to continue.      Pecola Leisure, PA      Larina Earthly, M.D.  Electronically Signed    AY/MEDQ  D:  10/17/2005  T:  10/17/2005  Job:  [id number]

## 2010-07-12 NOTE — Assessment & Plan Note (Signed)
[facility]ALTHCARE                              CARDIOLOGY OFFICE NOTE   RAN, TULLIS                      MRN:          [id number]  DATE:09/03/2005                            DOB:          12/17/1940    PRIMARY CARE PHYSICIAN:  Delaney Meigs, M.D.   REASON FOR PRESENTATION:  Evaluate the patient's atrial fibrillation and  previous cardiomyopathy.   HISTORY OF PRESENT ILLNESS:  The patient returns for followup of the above.  He has done quite well since the last time I saw him.  He denies any cardiac  complaints.  He says he is active at work, trying to take the stairs.  He  also pushes a lawnmower.  He does not exercise as much as I would like,  however.  With his activities, he denies any shortness of breath (class I  symptoms).  He has no PND or orthopnea.  He has no palpations that he  notices.  He has no presyncope or syncope.  He denies any chest pressure,  neck discomfort, arm discomfort, activity-induced nausea, vomiting,  excessive diaphoresis.   PAST MEDICAL HISTORY:  1.  Cardiomyopathy (EF had been about 35%.  It improved to normal on the      last echocardiogram).  2.  Mitral regurgitation (had been severe is now mild).  3.  Nonobstructive coronary disease.  4.  Permanent atrial fibrillation.  5.  Diabetes mellitus.  6.  Hyperlipidemia.  7.  Emphysema with previous tobacco abuse.   ALLERGIES:  PENICILLIN.   MEDICATIONS:  1.  Digoxin 0.125 mg every day.  2.  Glucotrol 10 mg q.h.s.  3.  Coumadin.  4.  Lasix 10 mg every day.  5.  Coreg 25 mg b.i.d.  6.  Multivitamin.  7.  Zetia 10 mg q.h.s.  8.  Lisinopril 40 mg b.i.d.   REVIEW OF SYSTEMS:  As stated in the HPI.  Negative for other systems.   PHYSICAL EXAMINATION:  GENERAL:  The patient is in no distress.  VITAL SIGNS:  Blood pressure 141/83, heart rate 79 and irregular, weight 177  pounds, body mass index 29.  HEENT:  Eyelids unremarkable.  Pupils are equal, round,  and reactive to  light.  Fundi not visualized.  Oral mucosa unremarkable.  NECK:  No jugular venous distention at 45 degrees.  Carotid upstrokes brisk  and symmetric.  Right carotid bruit.  No thyromegaly.  LYMPHATICS:  No cervical, axillary, or inguinal adenopathy.  LUNGS:  Clear to auscultation bilaterally.  BACK:  No costovertebral angle tenderness.  CHEST:  Unremarkable.  HEART:  PMI not displaced or sustained.  S1 and S2 within normal limits.  No  S3.  No murmurs.  ABDOMEN:  Flat.  Positive bowel sounds - normal in frequency and pitch.  No  bruits.  No rebound.  No guarding.  No midline pulsatile mass.  No  hepatomegaly.  No splenomegaly.  SKIN:  No rashes.  No nodules.  EXTREMITIES:  Pulses 2+ throughout.  No edema.  No cyanosis.  No clubbing.  NEUROLOGIC:  Oriented to person, place and  time.  Cranial nerves II-XII  grossly intact.  Motor grossly intact.   EKG:  Atrial fibrillation, axis within normal limits, intervals within  normal limits, no acute ST wave changes.   ASSESSMENT/PLAN:  1.  Cardiomyopathy.  The patient had a previous nonischemic cardiomyopathy.      He has an ejection fraction that is improved.  He will remain on his      beta-blockers and ACE inhibitors.  2.  Carotid bruit.  This is a new finding.  I will order carotid Dopplers.  3.  Atrial fibrillation.  The patient tolerates this with rate control on      anticoagulation. He has had documented good rate control in the past.      No further change to his therapy is warranted.  4.  Hypertension.  Blood pressure is borderline.  He will continue with the      current medications.  We will keep an eye on this.  It has not been      elevated recently.  5.  Risk reduction.  The patient is going to get a lipid profile when he      comes back for his carotid Doppler.  I will share this with Dr. Lysbeth Galas.      He has not been tolerant of statins in the past but maybe we can retry      these given his diabetes,  particularly if his lipids are not a target.   FOLLOWUP:  I will see him back in 1 years or sooner if needed.                               Rollene Rotunda, MD, [facility]    JH/MedQ  DD:  09/03/2005  DT:  09/03/2005  Job #:  [id number]   cc:   Delaney Meigs, MD

## 2010-07-12 NOTE — Discharge Summary (Signed)
Oscar [name], Oscar [name]               ACCOUNT NO.:  [id number]   MEDICAL RECORD NO.:  [id number]          PATIENT TYPE:  INP   LOCATION:  3304                         FACILITY:  [facility]   PHYSICIAN:  Larina Earthly, M.D.    DATE OF BIRTH:  1940/09/20   DATE OF ADMISSION:  10/21/2005  DATE OF DISCHARGE:  10/22/2005                                 DISCHARGE SUMMARY   PRIMARY DIAGNOSIS:  Asymptomatic left internal carotid artery stenosis.   SECONDARY DIAGNOSES:  1. Congestive heart failure with normal ejection fraction.  2. Diabetes mellitus.  3. Hypertension.  4. History of nephrolithiasis.  5. Arthritis.  6. Mitral regurgitation.  7. Nonobstructive atherosclerotic coronary disease.  8. Chronic atrial fibrillation.  9. Hyperlipidemia.  10.Chronic obstructive pulmonary disease.  11.Status post tonsillectomy.   IN-HOSPITAL OPERATIONS AND PROCEDURES:  Left carotid endarterectomy with  Dacron patch angioplasty.   THE PATIENT'S HISTORY AND PHYSICAL AND HOSPITAL COURSE:  This is a 70-year-  old Caucasian male who presented for evaluation of asymptomatic severe  carotid disease.  The patient presented to Dr. Juanetta Gosling for his annual exam  at which time a carotid bruit was heard.  The patient then underwent carotid  duplex which was reviewed by Dr. Arbie Cookey and the patient was found to have a  critical left carotid stenosis.  The patient denies headaches, nausea,  vomiting, dizziness, numbness, tingling, muscle weakness, dysarthria,  dysphagia, visual changes and all other TIA or CVA symptoms.  The patient's  only complaint is dyspnea on exertion.  The patient was seen and evaluated  by Dr. Arbie Cookey.  Dr. Arbie Cookey discussed with the patient undergoing left carotid  endarterectomy.  He discussed risks and benefits with the patient.  The  patient acknowledged understanding and agreed to proceed.  Surgery was  scheduled for October 21, 2005.   The patient was taken to the operating room October 13, 2005  where he  underwent a left carotid endarterectomy with Dacron patch angioplasty.  The  patient tolerated this procedure well and was transferred up to the  intensive care unit in stable condition.  The patient's postoperative course  was pretty much unremarkable.  Immediately following surgery the patient  seemed to be neurologically intact.  Postop day #1 the patient's hemoglobin  and hematocrit were stable at 13.1 and 37.9.  Creatinine was stable at 0.8.  Vital signs were monitored and the patient seemed to be afebrile with  systolic blood pressure in the 120s to 130s.  He was saturating greater than  90% on room air.  The patient's neuro remained intact.  He was out of bed  ambulating well.  Foley was discontinued and the patient voided without  difficulty.  Incisions were clean, dry and intact and healing well.  The  patient seemed to be clear to auscultation.  In atrial fibrillation.   The patient is discharged home postoperative day #1 in stable condition.  A  follow-up appointment has been arranged with Dr. Arbie Cookey for November 17, 2005 at 9:45 p.m.Marland Kitchen   ACTIVITY:  The patient was instructed on no driving until released to  do so  and no heavy lifting over 10 pounds.  He is told to ambulate 3-4 times per  day, progress as tolerated.   INCISIONAL CARE:  The patient is told to shower washing his incisions using  soap and water.  He is to contact the office if he develops any drainage or  opening from any of his incision sites.   DIET:  The patient instructed on diet to be low fat/low salt.   DISCHARGE MEDICATIONS:  1. Tylox one to two tablets q.4-6 h. p.r.n. pain.  2. Digoxin 0.125 mg daily.  3. Lasix 10 mg daily.  4. Coreg 25 mg b.i.d.  5. Lisinopril 40 mg b.i.d.  6. Coumadin 7.5 mg daily.  7. Multivitamin daily.  8. Zetia 10 mg daily.  9. Aspirin 81 mg daily.  10.Metformin 500 mg daily.  11.Albuterol p.r.n.      [name], [place]      Larina Earthly, M.D.   Electronically Signed    KMD/MEDQ  D:  10/22/2005  T:  10/22/2005  Job:  [id number]

## 2010-07-12 NOTE — Op Note (Signed)
[name], [name] NO.:  [id number]   MEDICAL RECORD NO.:  [id number]          PATIENT TYPE:  INP   LOCATION:  3304                         FACILITY:  [facility]   PHYSICIAN:  Larina Earthly, M.D.    DATE OF BIRTH:  01-18-1941   DATE OF PROCEDURE:  DATE OF DISCHARGE:                                 OPERATIVE REPORT   PREOPERATIVE DIAGNOSIS:  Severe asymptomatic left internal carotid artery  stenosis.   POSTOPERATIVE DIAGNOSIS:  Severe asymptomatic left internal carotid artery  stenosis.   PROCEDURE:  Left carotid endarterectomy and Dacron patch angioplasty.   SURGEON:  Larina Earthly, M.D.   ASSISTANT:  Coral Ceo, P.A.C.   ANESTHESIA:  General endotracheal.   COMPLICATIONS:  None.   DISPOSITION:  To recovery room, stable.   PROCEDURE IN DETAIL:  The patient was taken to the operating room, placed in  supine position with the area of the left neck prepped and draped in usual  sterile fashion.  Incision was made at anterior sternocleidomastoid and  carried down to the platysma with electrocautery.  The sternocleidomastoid  was retracted posteriorly and the carotid sheath was opened.  The facial  vein was ligated with 2-0 silk ties and divided.  The common carotid artery  was circled with an umbilical tape Rumel tourniquet.  The vagus and  hypoglossal nerves were identified and preserved.  Dissection was carried on  to the bifurcation.  The superior thyroid artery was circled with a 2-0 silk  Potts tie.  The external carotid was circled with the blue Vessel Loop and  the internal carotid was circled with an umbilical tape Rumel tourniquet.  The patient was given 8000 units of intravenous heparin.  After adequate  circulation time, the internal and external common carotid arteries were  occluded.  The common carotid artery was opened with an 11 blade, __________  Windle Guard scissors through the plaque, onto the internal carotid artery.  A 10  shunt was passed up  the internal carotid, allowed to back bleed, then down  the common carotid artery where it was secured with Rumel tourniquets.  Endarterectomy was done on common carotid artery and the plaque was divided  proximally with Potts scissors.  Endarterectomy was done on the  bifurcation.  The external carotid was endarterectomized with the eversion  technique and the internal carotid endarterectomized in an open fashion.  Remaining atheromatous debris was removed from the endarterectomy plane.   Finesse Hemashield Dacron patch was brought into the field.  Sewn as a patch  angioplasty with a running 6-0 Prolene suture.  Prior to completion of the  anastomosis, the shunt was removed and the __________ were undertaken.  The  anastomosis completed, external, followed by the common, followed by the  internal carotid artery occlusive clamps were removed.  Excellent flow  characteristics were noted with handheld Doppler in the internal and  external carotid arteries.  The patient was given 50 mg of protamine to  reverse the heparin.   The wounds were irrigated with saline.  Hemostasis with electrocautery.  The  wounds were closed with  3-0 Vicryl in the reapproximated sternocleidomastoid  and the carotid sheath.  Next, the platysma was closed with running 3-0  Vicryl suture, and finally the skin was closed with a 4-0 subcuticular  Vicryl stitch.  Sterile dressing was applied and the patient was taken to  the recovery room in stable condition.      Larina Earthly, M.D.  Electronically Signed     TFE/MEDQ  D:  10/21/2005  T:  10/21/2005  Job:  [id number]   cc:   Salvadore Farber, MD  Delaney Meigs, M.D.

## 2010-11-04 ENCOUNTER — Telehealth: Payer: Self-pay | Admitting: Cardiology

## 2010-11-04 MED ORDER — DIGOXIN 125 MCG PO TABS: 125.0000 ug | ORAL_TABLET | Freq: Every day | ORAL | Status: DC

## 2010-11-04 NOTE — Telephone Encounter (Signed)
RX sent into both pharmacies. Pt notified.

## 2010-11-04 NOTE — Telephone Encounter (Signed)
Pt needs rx for digoxin to [facility], 30 days supply, needs [facility] day [facility]

## 2010-11-05 ENCOUNTER — Other Ambulatory Visit: Payer: Self-pay | Admitting: *Deleted

## 2010-11-05 MED ORDER — AMLODIPINE BESYLATE 2.5 MG PO TABS: 2.5000 mg | ORAL_TABLET | Freq: Every day | ORAL | Status: DC

## 2010-11-05 MED ORDER — CARVEDILOL 25 MG PO TABS: 25.0000 mg | ORAL_TABLET | Freq: Two times a day (BID) | ORAL | Status: DC

## 2010-11-05 MED ORDER — FUROSEMIDE 20 MG PO TABS: 20.0000 mg | ORAL_TABLET | Freq: Every day | ORAL | Status: DC

## 2010-11-05 MED ORDER — LISINOPRIL 40 MG PO TABS: 40.0000 mg | ORAL_TABLET | Freq: Two times a day (BID) | ORAL | Status: DC

## 2010-11-06 ENCOUNTER — Other Ambulatory Visit: Payer: Self-pay | Admitting: Cardiology

## 2010-11-13 ENCOUNTER — Other Ambulatory Visit: Payer: Self-pay | Admitting: Cardiology

## 2010-11-13 MED ORDER — CARVEDILOL 25 MG PO TABS: 25.0000 mg | ORAL_TABLET | Freq: Two times a day (BID) | ORAL | Status: DC

## 2010-11-13 NOTE — Telephone Encounter (Signed)
Pt needs refill of lisinopril and carvedilol called to [facility] pharmacy, [facility] order delayed and pt out now, can call in asap?

## 2010-11-14 ENCOUNTER — Other Ambulatory Visit: Payer: Self-pay | Admitting: *Deleted

## 2010-11-14 MED ORDER — FUROSEMIDE 20 MG PO TABS: ORAL_TABLET | ORAL | Status: DC

## 2010-11-25 ENCOUNTER — Telehealth: Payer: Self-pay | Admitting: *Deleted

## 2010-11-25 DIAGNOSIS — I5022 Chronic systolic (congestive) heart failure: Secondary | ICD-10-CM

## 2010-11-25 MED ORDER — FUROSEMIDE 20 MG PO TABS: ORAL_TABLET | ORAL | Status: DC

## 2010-11-25 NOTE — Telephone Encounter (Signed)
Pt calling needing a refill on his Furosemide sent to both [facility] and [facility].

## 2010-12-18 ENCOUNTER — Encounter: Payer: Medicare Other | Admitting: Cardiology

## 2010-12-20 ENCOUNTER — Other Ambulatory Visit: Payer: Self-pay | Admitting: Family Medicine

## 2010-12-20 DIAGNOSIS — I6529 Occlusion and stenosis of unspecified carotid artery: Secondary | ICD-10-CM

## 2010-12-23 ENCOUNTER — Encounter (INDEPENDENT_AMBULATORY_CARE_PROVIDER_SITE_OTHER): Payer: Medicare Other | Admitting: Cardiology

## 2010-12-23 DIAGNOSIS — I6529 Occlusion and stenosis of unspecified carotid artery: Secondary | ICD-10-CM

## 2011-05-07 ENCOUNTER — Telehealth: Payer: Self-pay | Admitting: Cardiology

## 2011-05-07 NOTE — Telephone Encounter (Signed)
Error

## 2011-07-02 ENCOUNTER — Encounter: Payer: Self-pay | Admitting: Cardiology

## 2011-07-02 ENCOUNTER — Ambulatory Visit (INDEPENDENT_AMBULATORY_CARE_PROVIDER_SITE_OTHER): Payer: Medicare Other | Admitting: Cardiology

## 2011-07-02 VITALS — BP 130/90 | HR 77 | Ht 66.0 in | Wt 188.0 lb

## 2011-07-02 DIAGNOSIS — I5022 Chronic systolic (congestive) heart failure: Secondary | ICD-10-CM

## 2011-07-02 DIAGNOSIS — I6529 Occlusion and stenosis of unspecified carotid artery: Secondary | ICD-10-CM

## 2011-07-02 DIAGNOSIS — I428 Other cardiomyopathies: Secondary | ICD-10-CM

## 2011-07-02 DIAGNOSIS — I4891 Unspecified atrial fibrillation: Secondary | ICD-10-CM

## 2011-07-02 DIAGNOSIS — L989 Disorder of the skin and subcutaneous tissue, unspecified: Secondary | ICD-10-CM | POA: Insufficient documentation

## 2011-07-02 MED ORDER — FUROSEMIDE 20 MG PO TABS: ORAL_TABLET | ORAL | Status: DC

## 2011-07-02 MED ORDER — DIGOXIN 125 MCG PO TABS: 125.0000 ug | ORAL_TABLET | Freq: Every day | ORAL | Status: DC

## 2011-07-02 MED ORDER — AMLODIPINE BESYLATE 2.5 MG PO TABS: 2.5000 mg | ORAL_TABLET | Freq: Every day | ORAL | Status: DC

## 2011-07-02 MED ORDER — LISINOPRIL 40 MG PO TABS: 40.0000 mg | ORAL_TABLET | Freq: Two times a day (BID) | ORAL | Status: DC

## 2011-07-02 MED ORDER — CARVEDILOL 25 MG PO TABS: 25.0000 mg | ORAL_TABLET | Freq: Two times a day (BID) | ORAL | Status: DC

## 2011-07-02 NOTE — Assessment & Plan Note (Signed)
The patient  tolerates this rhythm and rate control and anticoagulation. We will continue with the meds as listed. He is not interested in changing to another anticoagulant.

## 2011-07-02 NOTE — Assessment & Plan Note (Signed)
His last EF was improved without MR.  No change in therapy or further imaging is indicated.

## 2011-07-02 NOTE — Assessment & Plan Note (Signed)
I will send him to a dermatologist.

## 2011-07-02 NOTE — Patient Instructions (Signed)
The current medical regimen is effective;  continue present plan and medications.  Your physician has requested that you have a carotid duplex. This test is an ultrasound of the carotid arteries in your neck. It looks at blood flow through these arteries that supply the brain with blood. Allow one hour for this exam. There are no restrictions or special instructions.  Please see a dermatologist.  Follow up in 1 year with Dr Antoine Poche.  You will receive a letter in the mail 2 months before you are due.  Please call us when you receive this letter to schedule your follow up appointment.

## 2011-07-02 NOTE — Assessment & Plan Note (Signed)
He is overdue for a Doppler and I will arrange this.

## 2011-07-02 NOTE — Progress Notes (Signed)
HPI The patient returns for followup of atrial fibrillation. Since I last saw him he has had no new complaints. He denies any palpitations, presyncope or syncope. He has had no chest pressure, neck or arm discomfort. He has had no new shortness of breath, PND or orthopnea.  He is retired but he does do yard work without limitations. She's had no problems taking the blood thinner. He denies any dark stools red blood hematochezia or hematemesis.  Allergies  Allergen Reactions  . Penicillins   . Statins     Current Outpatient Prescriptions  Medication Sig Dispense Refill  . amLODipine (NORVASC) 2.5 MG tablet Take 1 tablet (2.5 mg total) by mouth daily.  90 tablet  2  . carvedilol (COREG) 25 MG tablet Take 1 tablet (25 mg total) by mouth 2 (two) times daily with a meal.  60 tablet  6  . colesevelam (WELCHOL) 625 MG tablet Take 1,875 mg by mouth 3 (three) times daily.       . digoxin (LANOXIN) 0.125 MG tablet Take 1 tablet (125 mcg total) by mouth daily.  90 tablet  2  . ezetimibe (ZETIA) 10 MG tablet Take 10 mg by mouth daily.        . furosemide (LASIX) 20 MG tablet 1/2 tablet po qd  30 tablet  1  . glipiZIDE (GLUCOTROL) 10 MG tablet Take 10 mg by mouth daily.        Marland Kitchen lisinopril (PRINIVIL,ZESTRIL) 40 MG tablet Take 1 tablet (40 mg total) by mouth 2 (two) times daily.  180 tablet  2  . saxagliptin HCl (ONGLYZA) 2.5 MG TABS tablet Take by mouth daily.        Marland Kitchen warfarin (COUMADIN) 7.5 MG tablet Take 7.5 mg by mouth daily.          Past Medical History  Diagnosis Date  . Cardiomyopathy     EF 35% in the past--Improved to 55% echo 2/10. Moderately severe mitral regurgitation in the past, now improved,  . Atrial fibrillation     permanent  . Coronary artery disease     non obstructive  . Diabetes mellitus   . Dyslipidemia   . COPD (chronic obstructive pulmonary disease)   . History of tobacco abuse   . Nephrolithiasis   . Carotid artery stenosis     Past Surgical History    Procedure Date  . Tonsillectomy   . Left carotid endarterectomy     ROS:  As stated in the HPI and negative for all other systems.  PHYSICAL EXAM BP 130/90  Pulse 77  Ht 5\' 6"  (1.676 m)  Wt 188 lb (85.276 kg)  BMI 30.34 kg/m2 GENERAL:  Well appearing HEENT:  Pupils equal round and reactive, fundi not visualized, oral mucosa unremarkable, dentures NECK:  No jugular venous distention, waveform within normal limits, carotid upstroke brisk and symmetric, no bruits, no thyromegaly LYMPHATICS:  No cervical, inguinal adenopathy LUNGS:  Clear to auscultation bilaterally BACK:  No CVA tenderness CHEST:  Unremarkable HEART:  PMI not displaced or sustained,S1 and S2 within normal limits, no S3,  no clicks, no rubs, no murmurs, irregular ABD:  Flat, positive bowel sounds normal in frequency in pitch, no bruits, no rebound, no guarding, no midline pulsatile mass, no hepatomegaly, no splenomegaly EXT:  2 plus pulses throughout, no edema, no cyanosis no clubbing SKIN:  No raised dark lesion with slightly rolled borders on his face beside his nose on the right side. NEURO:  Cranial nerves II through  XII grossly intact, motor grossly intact throughout PSYCH:  Cognitively intact, oriented to person place and time  EKG:  Atrial fibrillation 77, axis within normal limits, intervals within normal limits, no acute ST-T wave changes. 07/02/2011   ASSESSMENT AND PLAN

## 2011-07-10 ENCOUNTER — Encounter (INDEPENDENT_AMBULATORY_CARE_PROVIDER_SITE_OTHER): Payer: Medicare Other

## 2011-07-10 DIAGNOSIS — I6529 Occlusion and stenosis of unspecified carotid artery: Secondary | ICD-10-CM

## 2011-07-14 ENCOUNTER — Other Ambulatory Visit: Payer: Self-pay | Admitting: *Deleted

## 2011-07-14 DIAGNOSIS — I6529 Occlusion and stenosis of unspecified carotid artery: Secondary | ICD-10-CM

## 2011-07-16 ENCOUNTER — Other Ambulatory Visit: Payer: Self-pay

## 2011-07-16 DIAGNOSIS — I6529 Occlusion and stenosis of unspecified carotid artery: Secondary | ICD-10-CM

## 2011-07-24 ENCOUNTER — Other Ambulatory Visit: Payer: Self-pay | Admitting: *Deleted

## 2011-07-24 DIAGNOSIS — I6529 Occlusion and stenosis of unspecified carotid artery: Secondary | ICD-10-CM

## 2011-07-25 ENCOUNTER — Encounter: Payer: Self-pay | Admitting: Cardiology

## 2011-07-28 ENCOUNTER — Ambulatory Visit (INDEPENDENT_AMBULATORY_CARE_PROVIDER_SITE_OTHER)
Admission: RE | Admit: 2011-07-28 | Discharge: 2011-07-28 | Disposition: A | Discharge status: Home / Self Care | Payer: Medicare Other | Source: Ambulatory Visit | Attending: Cardiology | Admitting: Cardiology

## 2011-07-28 ENCOUNTER — Encounter: Payer: Self-pay | Admitting: Vascular Surgery

## 2011-07-28 DIAGNOSIS — I6529 Occlusion and stenosis of unspecified carotid artery: Secondary | ICD-10-CM

## 2011-07-28 MED ORDER — IOHEXOL 350 MG/ML SOLN
80.0000 mL | Freq: Once | INTRAVENOUS | Status: AC | PRN
  Administered 2011-07-28: 80 mL via INTRAVENOUS

## 2011-07-29 ENCOUNTER — Encounter: Payer: Medicare Other | Admitting: Vascular Surgery

## 2011-07-29 ENCOUNTER — Ambulatory Visit (INDEPENDENT_AMBULATORY_CARE_PROVIDER_SITE_OTHER): Payer: Medicare Other | Admitting: Vascular Surgery

## 2011-07-29 ENCOUNTER — Encounter: Payer: Self-pay | Admitting: Vascular Surgery

## 2011-07-29 VITALS — BP 170/79 | HR 61 | Temp 98.3°F | Ht 66.0 in | Wt 186.9 lb

## 2011-07-29 DIAGNOSIS — I6529 Occlusion and stenosis of unspecified carotid artery: Secondary | ICD-10-CM

## 2011-07-29 NOTE — Progress Notes (Signed)
[facility]   Patient name: Oscar [name] MRN: [id number] DOB: 04-Jul-1940 Sex: male   Referred by: Antoine Poche  Reason for referral:  Chief Complaint  Patient presents with  . Carotid    New pt carotid stenosis/Dr. Hochrein- CT/angio 07/28/11    HISTORY OF PRESENT ILLNESS: The patient is well known to me from a prior left carotid endarterectomy in 2007. He had severe asymptomatic carotid stenosis and had an uneventful carotid endarterectomy. He did have a known moderate right carotid stenosis and has been followed with serial duplex ultrasounds at [facility] cardiology. He has had progression of his right asymptomatic carotid stenosis and is seen today with a recent CT angiogram. He specifically denies any amaurosis fugax transient ischemic attack or stroke. He is right-handed. He does have significant coronary disease with a history of congestion or heart failure and chronic atrial fibrillation.  Past Medical History  Diagnosis Date  . Cardiomyopathy     EF 35% in the past--Improved to 55% echo 2/10. Moderately severe mitral regurgitation in the past, now improved,  . Atrial fibrillation     permanent  . Coronary artery disease     non obstructive  . Diabetes mellitus   . Dyslipidemia   . COPD (chronic obstructive pulmonary disease)   . History of tobacco abuse   . Nephrolithiasis   . Carotid artery stenosis   . Anemia   . CHF (congestive heart failure)   . Myocardial infarction     Past Surgical History  Procedure Date  . Tonsillectomy   . Left carotid endarterectomy     History   Social History  . Marital Status: Married    Spouse Name: N/A    Number of Children: 5  . Years of Education: N/A   Occupational History  . Maintenance    Social History Main Topics  . Smoking status: Former Smoker    Types: Cigarettes    Quit date: 02/25/2000  . Smokeless tobacco: Former Neurosurgeon  . Alcohol Use: No  . Drug Use: No  . Sexually Active:     Other Topics Concern  . Not on file   Social History Narrative  . No narrative on file    Family History  Problem Relation Age of Onset  . Coronary artery disease Mother   . Diabetes Mother   . Heart disease Mother   . Hypertension Mother   . Other Mother     varicose veins  . Heart attack Mother   . Coronary artery disease Other   . Heart disease Sister   . Hypertension Sister   . Heart attack Sister   . Heart disease Brother   . Hyperlipidemia Brother   . Hypertension Brother   . Other Brother     varicose veins  . Heart attack Brother   . Diabetes Daughter   . Hypertension Daughter   . Other Daughter     varicose veins    Allergies as of 07/29/2011 - Review Complete 07/29/2011  Allergen Reaction Noted  . Penicillins  08/01/2008  . Statins  06/19/2010    Current Outpatient Prescriptions on File Prior to Visit  Medication Sig Dispense Refill  . amLODipine (NORVASC) 2.5 MG tablet Take 1 tablet (2.5 mg total) by mouth daily.  90 tablet  3  . carvedilol (COREG) 25 MG tablet Take 1 tablet (25 mg total) by mouth 2 (two) times daily with a meal.  180 tablet  3  . colesevelam (WELCHOL) 625  MG tablet Take 1,875 mg by mouth 3 (three) times daily.       . digoxin (LANOXIN) 0.125 MG tablet Take 1 tablet (125 mcg total) by mouth daily.  90 tablet  3  . ezetimibe (ZETIA) 10 MG tablet Take 10 mg by mouth daily.        . furosemide (LASIX) 20 MG tablet 1/2 tablet po qd  45 tablet  3  . glipiZIDE (GLUCOTROL) 10 MG tablet Take 10 mg by mouth daily.        Marland Kitchen lisinopril (PRINIVIL,ZESTRIL) 40 MG tablet Take 1 tablet (40 mg total) by mouth 2 (two) times daily.  180 tablet  3  . saxagliptin HCl (ONGLYZA) 2.5 MG TABS tablet Take by mouth daily.        Marland Kitchen warfarin (COUMADIN) 7.5 MG tablet Take 7.5 mg by mouth daily.         Current Facility-Administered Medications on File Prior to Visit  Medication Dose Route Frequency Provider Last Rate Last Dose  . iohexol (OMNIPAQUE) 350 MG/ML  injection 80 mL  80 mL Intravenous Once PRN Medication Radiologist, MD   80 mL at 07/28/11 1021     REVIEW OF SYSTEMS:  Positives indicated with an "X"  CARDIOVASCULAR:  [ ]  chest pain   [ ]  chest pressure   [ ]  palpitations   [ ]  orthopnea   [x ] dyspnea on exertion   [ ]  claudication   [ ]  rest pain   [ ]  DVT   [ ]  phlebitis PULMONARY:   [ ]  productive cough   [x ] asthma   [x ] wheezing NEUROLOGIC:   [ ]  weakness  [ ]  paresthesias  [ ]  aphasia  [ ]  amaurosis  [ ]  dizziness HEMATOLOGIC:   [ ]  bleeding problems   [ ]  clotting disorders MUSCULOSKELETAL:  [ ]  joint pain   [ ]  joint swelling GASTROINTESTINAL: [ ]   blood in stool  [ ]   hematemesis GENITOURINARY:  [ ]   dysuria  [ ]   hematuria PSYCHIATRIC:  [ ]  history of major depression INTEGUMENTARY:  [ ]  rashes  [ ]  ulcers CONSTITUTIONAL:  [ ]  fever   [ ]  chills  PHYSICAL EXAMINATION:  General: The patient is a well-nourished male, in no acute distress. Vital signs are BP 170/79  Pulse 61  Temp(Src) 98.3 F (36.8 C) (Oral)  Ht 5\' 6"  (1.676 m)  Wt 186 lb 14.4 oz (84.777 kg)  BMI 30.17 kg/m2 Pulmonary: There is a good air exchange bilaterally without wheezing or rales. Abdomen: Soft and non-tender with normal pitch bowel sounds. Musculoskeletal: There are no major deformities.  There is no significant extremity pain. Neurologic: No focal weakness or paresthesias are detected, Skin: There are no ulcer or rashes noted. Psychiatric: The patient has normal affect. Cardiovascular: There is a irregular rate and rhythm without significant murmur appreciated. Carotid arteries: Well-healed left carotid incision, 2+ carotid pulses bilaterally. No carotid bruits Pulse status 2+ radial and 2+ posterior tibial pulses   Vascular Lab Studies:   Duplex from 07/10/2011 reviewed all and this reveals widely patent left carotid endarterectomy and 60-79% right carotid stenosis  CT angiogram carotids from 6/ 04/2011 reviewed going 65-70% right  carotid stenosis  Impression and Plan:  Moderate to severe asymptomatic right internal carotid artery stenosis, widely patent left carotid endarterectomy site. I reviewed symptoms of carotid disease with the patient he'll notify us immediate should this occur. He is to continue his every 6 month carotid duplex  at [facility] cardiology. He will be referred back to [place] should he develop greater than 80% stenosis or develops symptoms    Aliea Bobe [facility] [place] Office: [phone]

## 2011-08-13 ENCOUNTER — Other Ambulatory Visit: Payer: Medicare Other

## 2011-08-13 ENCOUNTER — Encounter: Payer: Medicare Other | Admitting: Vascular Surgery

## 2012-05-11 ENCOUNTER — Telehealth: Payer: Self-pay | Admitting: Cardiology

## 2012-05-11 MED ORDER — CARVEDILOL 25 MG PO TABS: 25.0000 mg | ORAL_TABLET | Freq: Two times a day (BID) | ORAL | Status: DC

## 2012-05-11 NOTE — Telephone Encounter (Signed)
Coreg refill needed [employer]

## 2012-05-19 ENCOUNTER — Other Ambulatory Visit: Payer: Self-pay | Admitting: Cardiology

## 2012-06-07 ENCOUNTER — Other Ambulatory Visit: Payer: Self-pay | Admitting: Cardiology

## 2012-06-28 ENCOUNTER — Other Ambulatory Visit: Payer: Self-pay | Admitting: Cardiology

## 2012-06-30 ENCOUNTER — Telehealth: Payer: Self-pay | Admitting: Cardiology

## 2012-06-30 NOTE — Telephone Encounter (Signed)
New problem   Pt stated he was returning your phone call. Please call

## 2012-06-30 NOTE — Telephone Encounter (Signed)
Merita Norton to call pt with appts

## 2012-07-14 ENCOUNTER — Encounter: Payer: Medicare Other | Admitting: Cardiology

## 2012-07-14 ENCOUNTER — Encounter (INDEPENDENT_AMBULATORY_CARE_PROVIDER_SITE_OTHER): Payer: Medicare Other

## 2012-07-14 DIAGNOSIS — I6529 Occlusion and stenosis of unspecified carotid artery: Secondary | ICD-10-CM

## 2012-07-16 ENCOUNTER — Other Ambulatory Visit: Payer: Self-pay | Admitting: *Deleted

## 2012-07-16 DIAGNOSIS — I6523 Occlusion and stenosis of bilateral carotid arteries: Secondary | ICD-10-CM

## 2012-07-21 ENCOUNTER — Ambulatory Visit (INDEPENDENT_AMBULATORY_CARE_PROVIDER_SITE_OTHER): Payer: Medicare Other | Admitting: Cardiology

## 2012-07-21 VITALS — BP 138/70 | HR 85 | Ht 66.0 in | Wt 190.0 lb

## 2012-07-21 DIAGNOSIS — I428 Other cardiomyopathies: Secondary | ICD-10-CM

## 2012-07-21 DIAGNOSIS — I4891 Unspecified atrial fibrillation: Secondary | ICD-10-CM

## 2012-07-21 DIAGNOSIS — I080 Rheumatic disorders of both mitral and aortic valves: Secondary | ICD-10-CM

## 2012-07-21 MED ORDER — FENOFIBRATE 160 MG PO TABS: 160.0000 mg | ORAL_TABLET | Freq: Every day | ORAL | Status: DC

## 2012-07-21 NOTE — Patient Instructions (Addendum)
The current medical regimen is effective;  continue present plan and medications.  Follow up in 1 year with Dr Hochrein.  You will receive a letter in the mail 2 months before you are due.  Please call us when you receive this letter to schedule your follow up appointment.  

## 2012-07-21 NOTE — Progress Notes (Signed)
HPI The patient returns for followup of atrial fibrillation and previous cardiomyopathy Since I last saw him he has had no new complaints. He denies any palpitations, presyncope or syncope. He has had no chest pressure, neck or arm discomfort. He has had no new shortness of breath, PND or orthopnea.  He still does his yardwork. He does not feel his atrial fibrillation. He's had no trouble taking a blood thinner. He denies any presyncope or syncope.  Allergies  Allergen Reactions  . Penicillins   . Statins     Current Outpatient Prescriptions  Medication Sig Dispense Refill  . amLODipine (NORVASC) 2.5 MG tablet TAKE 1 TABLET BY MOUTH DAILY  90 tablet  1  . carvedilol (COREG) 25 MG tablet Take 1 tablet (25 mg total) by mouth 2 (two) times daily with a meal.  180 tablet  1  . colesevelam (WELCHOL) 625 MG tablet Take 1,875 mg by mouth 3 (three) times daily.       Marland Kitchen ezetimibe (ZETIA) 10 MG tablet Take 10 mg by mouth daily.        . furosemide (LASIX) 20 MG tablet TAKE 1/2 TABLET BY MOUTH EVERY DAY  45 tablet  1  . glipiZIDE (GLUCOTROL) 10 MG tablet Take 10 mg by mouth daily.        Marland Kitchen LANOXIN 0.125 MG tablet TAKE 1 TABLET BY MOUTH DAILY  90 tablet  1  . lisinopril (PRINIVIL,ZESTRIL) 40 MG tablet TAKE 1 TABLET BY MOUTH TWO TIMES DAILY  180 tablet  1  . saxagliptin HCl (ONGLYZA) 2.5 MG TABS tablet Take by mouth daily.        Marland Kitchen warfarin (COUMADIN) 7.5 MG tablet Take 7.5 mg by mouth daily.         No current facility-administered medications for this visit.    Past Medical History  Diagnosis Date  . Cardiomyopathy     EF 35% in the past--Improved to 55% echo 2/10. Moderately severe mitral regurgitation in the past, now improved,  . Atrial fibrillation     permanent  . Coronary artery disease     non obstructive  . Diabetes mellitus   . Dyslipidemia   . COPD (chronic obstructive pulmonary disease)   . History of tobacco abuse   . Nephrolithiasis   . Carotid artery stenosis   . Anemia    . CHF (congestive heart failure)   . Myocardial infarction     Past Surgical History  Procedure Laterality Date  . Tonsillectomy    . Left carotid endarterectomy      ROS:  As stated in the HPI and negative for all other systems.  PHYSICAL EXAM BP 138/70  Pulse 85  Ht 5\' 6"  (1.676 m)  Wt 190 lb (86.183 kg)  BMI 30.68 kg/m2 GENERAL:  Well appearing HEENT:  Pupils equal round and reactive, fundi not visualized, oral mucosa unremarkable, dentures NECK:  No jugular venous distention, waveform within normal limits, carotid upstroke brisk and symmetric, no bruits, no thyromegaly LYMPHATICS:  No cervical, inguinal adenopathy LUNGS:  Clear to auscultation bilaterally BACK:  No CVA tenderness CHEST:  Unremarkable HEART:  PMI not displaced or sustained,S1 and S2 within normal limits, no S3,  no clicks, no rubs, no murmurs, irregular ABD:  Flat, positive bowel sounds normal in frequency in pitch, no bruits, no rebound, no guarding, no midline pulsatile mass, no hepatomegaly, no splenomegaly EXT:  2 plus pulses throughout, no edema, no cyanosis no clubbing NEURO:  Cranial nerves II through XII  grossly intact, motor grossly intact throughout PSYCH:  Cognitively intact, oriented to person place and time  EKG:  Atrial fibrillation 85, axis within normal limits, intervals within normal limits, no acute ST-T wave changes. 07/21/2012   ASSESSMENT AND PLAN  ATRIAL FIBRILLATION:  He has no symptoms related to this. No change in therapy is indicated. He will continue with Coumadin and has no interest in NOACs  CARDIOMYOPATHY:  His EF is much improved at the last echo. He has no new symptoms. No change in therapy is indicated.  CAROTID STENOSIS:  This was last followed earlier this month and he has 60-79% stenosis on the right and a patent endarterectomy on the left. He has scheduled followup Dopplers and also followup with Dr. Arbie Cookey.   HTN:  The blood pressure is at target. No change in  medications is indicated. We will continue with therapeutic lifestyle changes (TLC).

## 2012-07-22 ENCOUNTER — Encounter: Payer: Self-pay | Admitting: Cardiology

## 2012-08-04 ENCOUNTER — Other Ambulatory Visit: Payer: Self-pay | Admitting: *Deleted

## 2012-08-04 MED ORDER — CARVEDILOL 25 MG PO TABS: 25.0000 mg | ORAL_TABLET | Freq: Two times a day (BID) | ORAL | Status: DC

## 2012-08-04 MED ORDER — AMLODIPINE BESYLATE 2.5 MG PO TABS: ORAL_TABLET | ORAL | Status: DC

## 2012-08-09 ENCOUNTER — Encounter: Payer: Self-pay | Admitting: Vascular Surgery

## 2012-08-10 ENCOUNTER — Encounter: Payer: Self-pay | Admitting: Vascular Surgery

## 2012-08-10 ENCOUNTER — Other Ambulatory Visit: Payer: Medicare Other

## 2012-08-10 ENCOUNTER — Ambulatory Visit (INDEPENDENT_AMBULATORY_CARE_PROVIDER_SITE_OTHER): Payer: Medicare Other | Admitting: Vascular Surgery

## 2012-08-10 DIAGNOSIS — I6529 Occlusion and stenosis of unspecified carotid artery: Secondary | ICD-10-CM

## 2012-08-10 DIAGNOSIS — Z48812 Encounter for surgical aftercare following surgery on the circulatory system: Secondary | ICD-10-CM | POA: Insufficient documentation

## 2012-08-10 NOTE — Progress Notes (Signed)
Vascular and Vein Specialist of [facility]   Patient name: Oscar [name] MRN: [id number] DOB: [birth year] Sex: male   Referred by: Hocherin  Reason for referral:  Chief Complaint  Patient presents with  . Carotid    F/up Right ica stenosis- U/S 07/14/12 ;  Left cea     HISTORY OF PRESENT ILLNESS: The patient presents today for evaluation of moderate to severe right internal carotid artery stenosis. He is a very pleasant to well known patient with a prior left carotid endarterectomy by myself in 2007. He had severe asymptomatic stenosis at that time. He has had no neurologic deficits. Specifically he has had no amaurosis fugax, transient ischemic attack or stroke. Recent duplex showed 60-79% right carotid stenosis and no significant restenosis of the left   Past Medical History  Diagnosis Date  . Cardiomyopathy     EF 35% in the past--Improved to 55% echo 2/10. Moderately severe mitral regurgitation in the past, now improved,  . Atrial fibrillation     permanent  . Coronary artery disease     non obstructive  . Diabetes mellitus   . Dyslipidemia   . COPD (chronic obstructive pulmonary disease)   . History of tobacco abuse   . Nephrolithiasis   . Carotid artery stenosis   . Anemia   . CHF (congestive heart failure)   . Myocardial infarction     Past Surgical History  Procedure Laterality Date  . Tonsillectomy    . Left carotid endarterectomy    . Carotid endarterectomy Left     cea    History   Social History  . Marital Status: Married    Spouse Name: N/A    Number of Children: 5  . Years of Education: N/A   Occupational History  . Maintenance    Social History Main Topics  . Smoking status: Former Smoker    Types: Cigarettes    Quit date: 02/25/2000  . Smokeless tobacco: Former Neurosurgeon  . Alcohol Use: No  . Drug Use: No  . Sexually Active: Not on file   Other Topics Concern  . Not on file   Social History Narrative  . No narrative on file    Family  History  Problem Relation Age of Onset  . Coronary artery disease Mother   . Diabetes Mother   . Heart disease Mother   . Hypertension Mother   . Other Mother     varicose veins  . Heart attack Mother   . Coronary artery disease Other   . Heart disease Sister   . Hypertension Sister   . Heart attack Sister   . Heart disease Brother   . Hyperlipidemia Brother   . Hypertension Brother   . Other Brother     varicose veins  . Heart attack Brother   . Diabetes Daughter   . Hypertension Daughter   . Other Daughter     varicose veins  . Heart disease Son 22    Heart Disease before age 109- Open hear surgery    Allergies as of 73/17/2014 - Review Complete 08/10/2012  Allergen Reaction Noted  . Penicillins Hives 08/01/2008  . Statins  06/19/2010    Current Outpatient Prescriptions on File Prior to Visit  Medication Sig Dispense Refill  . amLODipine (NORVASC) 2.5 MG tablet TAKE 1 TABLET BY MOUTH DAILY  90 tablet  3  . carvedilol (COREG) 25 MG tablet Take 1 tablet (25 mg total) by mouth 2 (two) times daily with a meal.  180 tablet  3  . colesevelam (WELCHOL) 625 MG tablet Take 1,875 mg by mouth 3 (three) times daily.       Marland Kitchen ezetimibe (ZETIA) 10 MG tablet Take 10 mg by mouth daily.        . furosemide (LASIX) 20 MG tablet TAKE 1/2 TABLET BY MOUTH EVERY DAY  45 tablet  1  . glipiZIDE (GLUCOTROL) 10 MG tablet Take 10 mg by mouth daily.        Marland Kitchen LANOXIN 0.125 MG tablet TAKE 1 TABLET BY MOUTH DAILY  90 tablet  1  . lisinopril (PRINIVIL,ZESTRIL) 40 MG tablet TAKE 1 TABLET BY MOUTH TWO TIMES DAILY  180 tablet  1  . saxagliptin HCl (ONGLYZA) 2.5 MG TABS tablet Take by mouth daily.        Marland Kitchen warfarin (COUMADIN) 7.5 MG tablet Take 7.5 mg by mouth daily.        . fenofibrate 160 MG tablet Take 1 tablet (160 mg total) by mouth daily.  90 tablet  11   No current facility-administered medications on file prior to visit.     REVIEW OF SYSTEMS:  Positives indicated with an  "X"  CARDIOVASCULAR:  [ ]  chest pain   [ ]  chest pressure   [ ]  palpitations   [ ]  orthopnea   [ ]  dyspnea on exertion   [ ]  claudication   [ ]  rest pain   [ ]  DVT   [ ]  phlebitis PULMONARY:   [ ]  productive cough   [ ]  asthma   [ ]  wheezing NEUROLOGIC:   [ ]  weakness  [ ]  paresthesias  [ ]  aphasia  [ ]  amaurosis  [ ]  dizziness HEMATOLOGIC:   [ ]  bleeding problems   [ ]  clotting disorders MUSCULOSKELETAL:  [ ]  joint pain   [ ]  joint swelling GASTROINTESTINAL: [ ]   blood in stool  [ ]   hematemesis GENITOURINARY:  [ ]   dysuria  [ ]   hematuria PSYCHIATRIC:  [ ]  history of major depression INTEGUMENTARY:  [ ]  rashes  [ ]  ulcers CONSTITUTIONAL:  [ ]  fever   [ ]  chills  PHYSICAL EXAMINATION:  General: The patient is a well-nourished male, in no acute distress. Vital signs are BP 128/76  Pulse 96  Resp 16  Ht 5\' 6"  (1.676 m)  Wt 186 lb (84.369 kg)  BMI 30.04 kg/m2  SpO2 97% Pulmonary: There is a good air exchange bilaterally without wheezing or rales. Abdomen: Soft and non-tender with normal pitch bowel sounds. Musculoskeletal: There are no major deformities.  There is no significant extremity pain. Neurologic: No focal weakness or paresthesias are detected, Skin: There are no ulcer or rashes noted. Psychiatric: The patient has normal affect. Cardiovascular: There is a regular rate and rhythm without significant murmur appreciated. Neurologically grossly intact No carotid bruits bilaterally. Well-healed left neck incision.   Vascular Lab Studies  Duplex from 07/14/2012 was reviewed. This revealed widely patent endarterectomy on the left and 60-70% stenosis of the right   Impression and Plan:  Asymptomatic moderate to severe right internal carotid artery stenosis. I recommend 6 month followup surveillance. Again discussed disease of symptoms related to this with the patient he'll notify us immediate should this occur    Jamond Neels [facility]  [place] Office: [phone]

## 2012-08-11 ENCOUNTER — Other Ambulatory Visit: Payer: Self-pay | Admitting: *Deleted

## 2012-08-11 DIAGNOSIS — Z48812 Encounter for surgical aftercare following surgery on the circulatory system: Secondary | ICD-10-CM

## 2012-11-15 ENCOUNTER — Other Ambulatory Visit: Payer: Self-pay | Admitting: Cardiology

## 2012-11-20 ENCOUNTER — Other Ambulatory Visit: Payer: Self-pay | Admitting: Cardiology

## 2012-12-27 ENCOUNTER — Other Ambulatory Visit: Payer: Self-pay | Admitting: Cardiology

## 2013-02-08 ENCOUNTER — Ambulatory Visit: Payer: Medicare Other | Admitting: Vascular Surgery

## 2013-02-08 ENCOUNTER — Other Ambulatory Visit: Payer: Medicare Other

## 2013-02-09 ENCOUNTER — Encounter: Payer: Self-pay | Admitting: Family

## 2013-02-10 ENCOUNTER — Ambulatory Visit (HOSPITAL_COMMUNITY)
Admission: RE | Admit: 2013-02-10 | Discharge: 2013-02-10 | Disposition: A | Discharge status: Home / Self Care | Payer: Medicare Other | Source: Ambulatory Visit | Attending: Vascular Surgery | Admitting: Vascular Surgery

## 2013-02-10 ENCOUNTER — Other Ambulatory Visit (HOSPITAL_COMMUNITY): Payer: Medicare Other

## 2013-02-10 ENCOUNTER — Encounter: Payer: Self-pay | Admitting: Family

## 2013-02-10 ENCOUNTER — Ambulatory Visit (INDEPENDENT_AMBULATORY_CARE_PROVIDER_SITE_OTHER): Payer: Medicare Other | Admitting: Family

## 2013-02-10 DIAGNOSIS — Z48812 Encounter for surgical aftercare following surgery on the circulatory system: Secondary | ICD-10-CM

## 2013-02-10 DIAGNOSIS — I6529 Occlusion and stenosis of unspecified carotid artery: Secondary | ICD-10-CM | POA: Insufficient documentation

## 2013-02-10 NOTE — Progress Notes (Signed)
Established Carotid Patient   History of Present Illness  Oscar [name] is a 72 y.o. male who is status post left carotid endarterectomy by Dr. Arbie Cookey in 2007. He had severe asymptomatic stenosis at that time.  He returns today for carotid artery surveillance and follow up. Dr. Arbie Cookey documents six months ago that recent (outside facility) duplex showed 60-79% right carotid stenosis and no significant restenosis of the left. States he is physically active in the house and yard and occasionally caring for his great grandchildren. He has a history of CHF.  Patient has Negative history of TIA or stroke symptom.  The patient denies amaurosis fugax or monocular blindness.  The patient  denies facial drooping.  Pt. denies hemiplegia.  The patient denies receptive or expressive aphasia.  Pt. denies extremity weakness. He denies claudication symptoms, denies non-healing wounds.  Patient  denies New Medical or Surgical History.  Pt Diabetic: Yes, states in good control Pt smoker: former smoker, quit in 2002  Pt meds include: Statin : No: statins cause myalgias and arthralgias Betablocker: Yes ASA: No Other anticoagulants/antiplatelets: coumadin for atrial fib.   Past Medical History  Diagnosis Date  . Cardiomyopathy     EF 35% in the past--Improved to 55% echo 2/10. Moderately severe mitral regurgitation in the past, now improved,  . Atrial fibrillation     permanent  . Coronary artery disease     non obstructive  . Diabetes mellitus   . Dyslipidemia   . COPD (chronic obstructive pulmonary disease)   . History of tobacco abuse   . Nephrolithiasis   . Carotid artery stenosis   . Anemia   . CHF (congestive heart failure)   . Myocardial infarction     Social History History  Substance Use Topics  . Smoking status: Former Smoker    Types: Cigarettes    Quit date: 02/25/2000  . Smokeless tobacco: Former Neurosurgeon  . Alcohol Use: No    Family History Family History  Problem  Relation Age of Onset  . Coronary artery disease Mother   . Diabetes Mother   . Heart disease Mother   . Hypertension Mother   . Other Mother     varicose veins  . Heart attack Mother   . Coronary artery disease Other   . Heart disease Sister   . Hypertension Sister   . Heart attack Sister   . Heart disease Brother   . Hyperlipidemia Brother   . Hypertension Brother   . Other Brother     varicose veins  . Heart attack Brother   . Diabetes Daughter   . Hypertension Daughter   . Other Daughter     varicose veins  . Heart disease Son 9    Heart Disease before age 57- Open hear surgery    Surgical History Past Surgical History  Procedure Laterality Date  . Tonsillectomy    . Left carotid endarterectomy    . Carotid endarterectomy Left     cea    Allergies  Allergen Reactions  . Penicillins Hives    Blisters on hands  . Statins     Joint and muscle pain and low energy.    Current Outpatient Prescriptions  Medication Sig Dispense Refill  . amLODipine (NORVASC) 2.5 MG tablet TAKE 1 TABLET BY MOUTH DAILY  90 tablet  3  . carvedilol (COREG) 25 MG tablet Take 1 tablet (25 mg total) by mouth 2 (two) times daily with a meal.  180 tablet  3  .  colesevelam (WELCHOL) 625 MG tablet Take 1,875 mg by mouth 3 (three) times daily.       Marland Kitchen ezetimibe (ZETIA) 10 MG tablet Take 10 mg by mouth daily.        . fenofibrate 160 MG tablet Take 1 tablet (160 mg total) by mouth daily.  90 tablet  11  . furosemide (LASIX) 20 MG tablet TAKE ONE-HALF (1/2) TABLET DAILY  45 tablet  10  . glipiZIDE (GLUCOTROL) 10 MG tablet Take 10 mg by mouth daily.        Marland Kitchen LANOXIN 125 MCG tablet TAKE 1 TABLET DAILY  90 tablet  3  . lisinopril (PRINIVIL,ZESTRIL) 40 MG tablet TAKE 1 TABLET TWICE A DAY  180 tablet  3  . saxagliptin HCl (ONGLYZA) 2.5 MG TABS tablet Take by mouth daily.        Marland Kitchen warfarin (COUMADIN) 7.5 MG tablet Take 7.5 mg by mouth daily.         No current facility-administered medications for  this visit.    Review of Systems : See HPI for pertinent positives and negatives.   Physical Examination  Filed Vitals:   02/10/13 1502  BP: 146/73  Pulse: 88  Resp: 16   Filed Weights   02/10/13 1502  Weight: 184 lb (83.462 kg)   Body mass index is 29.71 kg/(m^2).  General: WDWN male in NAD GAIT: normal Eyes: PERRLA Pulmonary:  CTAB, Negative  Rales, Negative rhonchi, & Negative wheezing.  Cardiac: irregular Rhythm.  VASCULAR EXAM Carotid Bruits Left Right   Negative Negative    Radial pulses are 3+ palpable and equal.                                                                                                                            LE Pulses LEFT RIGHT       POPLITEAL  not palpable   not palpable    Gastrointestinal: soft, nontender, BS WNL, no r/g,  negative masses.  Musculoskeletal: Negative muscle atrophy/wasting. M/S 5/5 throughout, Extremities without ischemic changes.  Neurologic: A&O X 3; Appropriate Affect ; SENSATION ;normal;  Speech is normal CN 2-12 intact, Pain and light touch intact in extremities, Motor exam as listed above.   Non-Invasive Vascular Imaging CAROTID DUPLEX 02/10/2013   Right ICA: 60 - 79 % stenosis. Left ICA: widely patent without evidence of restenosis.  Previous carotid studies  (outside facility) duplex showed 60-79% right carotid stenosis and no significant restenosis of the left.  These findings are Unchanged from previous exam.    Assessment: Oscar [name] is a 72 y.o. male who presents with asymptomatic 60 - 79 % Right ICA  Stenosis and widely patent left ICA which is the CEA site. The  ICA stenosis is  Unchanged from previous exam.  Plan: Follow-up in 6 months with Carotid Duplex scan.   I discussed in depth with the patient the nature of atherosclerosis, and emphasized the importance of maximal medical management  including strict control of blood pressure, blood glucose, and lipid levels, obtaining  regular exercise, and continued cessation of smoking.  The patient is aware that without maximal medical management the underlying atherosclerotic disease process will progress, limiting the benefit of any interventions. The patient was given information about stroke prevention and what symptoms should prompt the patient to seek immediate medical care. Thank you for allowing [place] to participate in this patient's care.  Charisse March, RN, MSN, FNP-C [facility] Office: [phone]  Clinic Physician: Darrick Penna  02/10/2013 2:46 PM

## 2013-02-10 NOTE — Patient Instructions (Signed)
Stroke Prevention Some medical conditions and behaviors are associated with an increased chance of having a stroke. You may prevent a stroke by making healthy choices and managing medical conditions. Reduce your risk of having a stroke by:  Staying physically active. Get at least 30 minutes of activity on most or all days.  Not smoking. It may also be helpful to avoid exposure to secondhand smoke.  Limiting alcohol use. Moderate alcohol use is considered to be:  No more than 2 drinks per day for men.  No more than 1 drink per day for nonpregnant women.  Eating healthy foods.  Include 5 or more servings of fruits and vegetables a day.  Certain diets may be prescribed to address high blood pressure, high cholesterol, diabetes, or obesity.  Managing your cholesterol levels.  A low-saturated fat, low-trans fat, low-cholesterol, and high-fiber diet may control cholesterol levels.  Take any prescribed medicines to control cholesterol as directed by your caregiver.  Managing your diabetes.  A controlled-carbohydrate, controlled-sugar diet is recommended to manage diabetes.  Take any prescribed medicines to control diabetes as directed by your caregiver.  Controlling your high blood pressure (hypertension).  A low-salt (sodium), low-saturated fat, low-trans fat, and low-cholesterol diet is recommended to manage high blood pressure.  Take any prescribed medicines to control hypertension as directed by your caregiver.  Maintaining a healthy weight.  A reduced-calorie, low-sodium, low-saturated fat, low-trans fat, low-cholesterol diet is recommended to manage weight.  Stopping drug abuse.  Avoiding birth control pills.  Talk to your caregiver about the risks of taking birth control pills if you are over 35 years old, smoke, get migraines, or have ever had a blood clot.  Getting evaluated for sleep disorders (sleep apnea).  Talk to your caregiver about getting a sleep evaluation  if you snore a lot or have excessive sleepiness.  Taking medicines as directed by your caregiver.  For some people, aspirin or blood thinners (anticoagulants) are helpful in reducing the risk of forming abnormal blood clots that can lead to stroke. If you have the irregular heart rhythm of atrial fibrillation, you should be on a blood thinner unless there is a good reason you cannot take them.  Understand all your medicine instructions. SEEK IMMEDIATE MEDICAL CARE IF:   You have sudden weakness or numbness of the face, arm, or leg, especially on one side of the body.  You have sudden confusion.  You have trouble speaking (aphasia) or understanding.  You have sudden trouble seeing in one or both eyes.  You have sudden trouble walking.  You have dizziness.  You have a loss of balance or coordination.  You have a sudden, severe headache with no known cause.  You have new chest pain or an irregular heartbeat. Any of these symptoms may represent a serious problem that is an emergency. Do not wait to see if the symptoms will go away. Get medical help right away. Call your local emergency services (911 in [place] Do not drive yourself to the hospital. Document Released: 03/20/2004 Document Revised: 05/05/2011 Document Reviewed: 08/13/2012 [employer] Patient Information 2014 [employer].  

## 2013-06-09 ENCOUNTER — Other Ambulatory Visit: Payer: Self-pay | Admitting: *Deleted

## 2013-06-09 MED ORDER — AMLODIPINE BESYLATE 2.5 MG PO TABS: ORAL_TABLET | ORAL | Status: DC

## 2013-06-23 ENCOUNTER — Other Ambulatory Visit: Payer: Self-pay | Admitting: Cardiology

## 2013-07-18 ENCOUNTER — Other Ambulatory Visit: Payer: Self-pay | Admitting: Cardiology

## 2013-08-03 ENCOUNTER — Ambulatory Visit: Payer: Medicare Other | Admitting: Cardiology

## 2013-08-10 ENCOUNTER — Encounter: Payer: Self-pay | Admitting: Family

## 2013-08-11 ENCOUNTER — Ambulatory Visit (HOSPITAL_COMMUNITY)
Admission: RE | Admit: 2013-08-11 | Discharge: 2013-08-11 | Disposition: A | Discharge status: Home / Self Care | Payer: Medicare Other | Source: Ambulatory Visit | Attending: Family | Admitting: Family

## 2013-08-11 ENCOUNTER — Encounter: Payer: Self-pay | Admitting: Family

## 2013-08-11 ENCOUNTER — Ambulatory Visit (INDEPENDENT_AMBULATORY_CARE_PROVIDER_SITE_OTHER): Payer: Medicare Other | Admitting: Family

## 2013-08-11 VITALS — BP 136/75 | HR 57 | Resp 16 | Ht 66.0 in | Wt 176.0 lb

## 2013-08-11 DIAGNOSIS — Z48812 Encounter for surgical aftercare following surgery on the circulatory system: Secondary | ICD-10-CM

## 2013-08-11 DIAGNOSIS — I6529 Occlusion and stenosis of unspecified carotid artery: Secondary | ICD-10-CM

## 2013-08-11 NOTE — Patient Instructions (Signed)
Stroke Prevention Some medical conditions and behaviors are associated with an increased chance of having a stroke. You may prevent a stroke by making healthy choices and managing medical conditions. HOW CAN I REDUCE MY RISK OF HAVING A STROKE?   Stay physically active. Get at least 30 minutes of activity on most or all days.  Do not smoke. It may also be helpful to avoid exposure to secondhand smoke.  Limit alcohol use. Moderate alcohol use is considered to be:  No more than 2 drinks per day for men.  No more than 1 drink per day for nonpregnant women.  Eat healthy foods. This involves  Eating 5 or more servings of fruits and vegetables a day.  Following a diet that addresses high blood pressure (hypertension), high cholesterol, diabetes, or obesity.  Manage your cholesterol levels.  A diet low in saturated fat, trans fat, and cholesterol and high in fiber may control cholesterol levels.  Take any prescribed medicines to control cholesterol as directed by your health care provider.  Manage your diabetes.  A controlled-carbohydrate, controlled-sugar diet is recommended to manage diabetes.  Take any prescribed medicines to control diabetes as directed by your health care provider.  Control your hypertension.  A low-salt (sodium), low-saturated fat, low-trans fat, and low-cholesterol diet is recommended to manage hypertension.  Take any prescribed medicines to control hypertension as directed by your health care provider.  Maintain a healthy weight.  A reduced-calorie, low-sodium, low-saturated fat, low-trans fat, low-cholesterol diet is recommended to manage weight.  Stop drug abuse.  Avoid taking birth control pills.  Talk to your health care provider about the risks of taking birth control pills if you are over 35 years old, smoke, get migraines, or have ever had a blood clot.  Get evaluated for sleep disorders (sleep apnea).  Talk to your health care provider about  getting a sleep evaluation if you snore a lot or have excessive sleepiness.  Take medicines as directed by your health care provider.  For some people, aspirin or blood thinners (anticoagulants) are helpful in reducing the risk of forming abnormal blood clots that can lead to stroke. If you have the irregular heart rhythm of atrial fibrillation, you should be on a blood thinner unless there is a good reason you cannot take them.  Understand all your medicine instructions.  Make sure that other other conditions (such as anemia or atherosclerosis) are addressed. SEEK IMMEDIATE MEDICAL CARE IF:   You have sudden weakness or numbness of the face, arm, or leg, especially on one side of the body.  Your face or eyelid droops to one side.  You have sudden confusion.  You have trouble speaking (aphasia) or understanding.  You have sudden trouble seeing in one or both eyes.  You have sudden trouble walking.  You have dizziness.  You have a loss of balance or coordination.  You have a sudden, severe headache with no known cause.  You have new chest pain or an irregular heartbeat. Any of these symptoms may represent a serious problem that is an emergency. Do not wait to see if the symptoms will go away. Get medical help at once. Call your local emergency services  (911 in [place] Do not drive yourself to the hospital. Document Released: 03/20/2004 Document Revised: 12/01/2012 Document Reviewed: 08/13/2012 Exit[employer] Patient Information 2015 [employer]. This information is not intended to replace advice given to you by your health care provider. Make sure you discuss any questions you have with your health   care provider.  

## 2013-08-11 NOTE — Progress Notes (Signed)
Established Carotid Patient   History of Present Illness  Oscar [name] is a 73 y.o. male who is status post left carotid endarterectomy by Dr. Donnetta Hutching in 2007. He had severe asymptomatic stenosis at that time.  He returns today for carotid artery surveillance and follow up.  Dr. Donnetta Hutching documents in June, 2014 that recent (outside facility) duplex showed 60-79% right carotid stenosis and no significant restenosis of the left.  States he is physically active in the house and yard and occasionally caring for his great grandchildren.  He has a history of CHF.  Patient has Negative history of TIA or stroke symptom. The patient denies amaurosis fugax or monocular blindness. The patient denies facial drooping.  Pt. denies hemiplegia. The patient denies receptive or expressive aphasia. Pt. denies extremity weakness.  He denies claudication symptoms, denies non-healing wounds.  Patient denies New Medical or Surgical History.   Pt Diabetic: Yes, states in good control  Pt smoker: former smoker, quit in 2002   Pt meds include:  Statin : No: statins cause myalgias and arthralgias  Betablocker: Yes  ASA: No  Other anticoagulants/antiplatelets: coumadin for atrial fib.   Past Medical History  Diagnosis Date  . Cardiomyopathy     EF 35% in the past--Improved to 55% echo 2/10. Moderately severe mitral regurgitation in the past, now improved,  . Atrial fibrillation     permanent  . Coronary artery disease     non obstructive  . Diabetes mellitus   . Dyslipidemia   . COPD (chronic obstructive pulmonary disease)   . History of tobacco abuse   . Nephrolithiasis   . Carotid artery stenosis   . Anemia   . CHF (congestive heart failure)   . Myocardial infarction     Social History History  Substance Use Topics  . Smoking status: Former Smoker    Types: Cigarettes    Quit date: 02/25/2000  . Smokeless tobacco: Former Systems developer  . Alcohol Use: No    Family History Family History  Problem  Relation Age of Onset  . Coronary artery disease Mother   . Diabetes Mother   . Heart disease Mother   . Hypertension Mother   . Other Mother     varicose veins  . Heart attack Mother   . Coronary artery disease Other   . Heart disease Sister   . Hypertension Sister   . Heart attack Sister   . Heart disease Brother   . Hyperlipidemia Brother   . Hypertension Brother   . Other Brother     varicose veins  . Heart attack Brother   . Diabetes Daughter   . Hypertension Daughter   . Other Daughter     varicose veins  . Heart disease Son 52    Heart Disease before age 35- Open hear surgery    Surgical History Past Surgical History  Procedure Laterality Date  . Tonsillectomy    . Left carotid endarterectomy    . Carotid endarterectomy Left 10-21-05    cea    Allergies  Allergen Reactions  . Penicillins Hives    Blisters on hands  . Statins     Joint and muscle pain and low energy.    Current Outpatient Prescriptions  Medication Sig Dispense Refill  . amLODipine (NORVASC) 2.5 MG tablet TAKE 1 TABLET BY MOUTH DAILY  90 tablet  0  . carvedilol (COREG) 25 MG tablet TAKE 1 TABLET TWICE A DAY WITH MEALS  180 tablet  0  . colesevelam (  WELCHOL) 625 MG tablet Take 1,875 mg by mouth 3 (three) times daily.       Marland Kitchen ezetimibe (ZETIA) 10 MG tablet Take 10 mg by mouth daily.        . fenofibrate 160 MG tablet TAKE 1 TABLET DAILY  90 tablet  0  . furosemide (LASIX) 20 MG tablet TAKE ONE-HALF (1/2) TABLET DAILY  45 tablet  10  . glipiZIDE (GLUCOTROL) 10 MG tablet Take 10 mg by mouth daily.        Marland Kitchen LANOXIN 125 MCG tablet TAKE 1 TABLET DAILY  90 tablet  3  . lisinopril (PRINIVIL,ZESTRIL) 40 MG tablet TAKE 1 TABLET TWICE A DAY  180 tablet  3  . Multiple Vitamin (MULTIVITAMIN) tablet Take 1 tablet by mouth daily.      . Omega-3 Fatty Acids (FISH OIL) 1200 MG CAPS Take by mouth 3 (three) times daily.      . saxagliptin HCl (ONGLYZA) 2.5 MG TABS tablet Take by mouth daily.        Marland Kitchen warfarin  (COUMADIN) 7.5 MG tablet Take 7.5 mg by mouth daily.         No current facility-administered medications for this visit.    Review of Systems : See HPI for pertinent positives and negatives.  Physical Examination  Filed Vitals:   08/11/13 1359 08/11/13 1402  BP: 129/78 136/75  Pulse: 66 57  Resp:  16  Height:  5\' 6"  (1.676 m)  Weight:  176 lb (79.833 kg)  SpO2:  97%   Body mass index is 28.42 kg/(m^2).  General: WDWN male in NAD  GAIT: normal  Eyes: PERRLA  Pulmonary: CTAB, Negative Rales, Negative rhonchi, & Negative wheezing.  Cardiac: irregular Rhythm.   VASCULAR EXAM  Carotid Bruits  Left  Right    Negative  Negative   Radial pulses are 3+ palpable and equal.  LE Pulses  LEFT  RIGHT   POPLITEAL  not palpable  not palpable    Gastrointestinal: soft, nontender, BS WNL, no r/g, negative masses.  Musculoskeletal: Negative muscle atrophy/wasting. M/S 5/5 throughout, Extremities without ischemic changes.  Neurologic: A&O X 3; Appropriate Affect ; SENSATION ;normal;  Speech is normal  CN 2-12 intact, Pain and light touch intact in extremities, Motor exam as listed above.   CEREBROVASCULAR DUPLEX EVALUATION (08/11/2013)     INDICATION: Carotid artery disease     PREVIOUS INTERVENTION(S): Left carotid endarterectomy 2007.    DUPLEX EXAM:     RIGHT  LEFT  Peak Systolic Velocities (cm/s) End Diastolic Velocities (cm/s) Plaque LOCATION Peak Systolic Velocities (cm/s) End Diastolic Velocities (cm/s) Plaque  53 10  CCA PROXIMAL 65 9   49 9  CCA MID 80 13 HT  56 12  CCA DISTAL 97 10 HT  96 0 HT ECA 156 7 HT  366 106 HT ICA PROXIMAL 98 7   49 14  ICA MID 49 17   55 16  ICA DISTAL 67 17     7.46 ICA / CCA Ratio (PSV)   Antegrade  Vertebral Flow Antegrade   749 Brachial Systolic Pressure (mmHg) 449  Triphasic  Brachial Artery Waveforms Triphasic     Plaque Morphology:  HM = Homogeneous, HT = Heterogeneous, CP = Calcific Plaque, SP = Smooth Plaque, IP = Irregular  Plaque     ADDITIONAL FINDINGS:     IMPRESSION: Right internal carotid artery velocities suggest a 60-79% stenosis (high end of range).  Patent left carotid endarterectomy site with no evidence  of restenosis.     Compared to the previous exam:  No significant change in comparison to the last exam on 02/10/2013.     Assessment: Oscar [name] is a 73 y.o. male who presents with asymptomatic 60 - 79 % Right ICA  Stenosis and patent left ICA s/p CEA.. The  ICA stenosis is  Unchanged from previous exam.  Plan: Follow-up in 6 months with Carotid Duplex scan.   I discussed in depth with the patient the nature of atherosclerosis, and emphasized the importance of maximal medical management including strict control of blood pressure, blood glucose, and lipid levels, obtaining regular exercise, and continued cessation of smoking.  The patient is aware that without maximal medical management the underlying atherosclerotic disease process will progress, limiting the benefit of any interventions. The patient was given information about stroke prevention and what symptoms should prompt the patient to seek immediate medical care. Thank you for allowing [place] to participate in this patient's care.  Clemon Chambers, RN, MSN, FNP-C [facility] Office: [phone]  Clinic Physician: Trula Slade on call  08/11/2013 1:56 PM

## 2013-08-12 NOTE — Addendum Note (Signed)
Addended by: Mena Goes on: 08/12/2013 03:08 PM   Modules accepted: Orders

## 2013-08-19 ENCOUNTER — Encounter: Payer: Self-pay | Admitting: Cardiology

## 2013-08-19 ENCOUNTER — Ambulatory Visit (INDEPENDENT_AMBULATORY_CARE_PROVIDER_SITE_OTHER): Payer: Medicare Other | Admitting: Cardiology

## 2013-08-19 ENCOUNTER — Other Ambulatory Visit: Payer: Self-pay | Admitting: Cardiology

## 2013-08-19 VITALS — BP 148/84 | HR 85 | Ht 66.0 in | Wt 180.0 lb

## 2013-08-19 DIAGNOSIS — I080 Rheumatic disorders of both mitral and aortic valves: Secondary | ICD-10-CM

## 2013-08-19 DIAGNOSIS — I4891 Unspecified atrial fibrillation: Secondary | ICD-10-CM

## 2013-08-19 DIAGNOSIS — I6529 Occlusion and stenosis of unspecified carotid artery: Secondary | ICD-10-CM

## 2013-08-19 DIAGNOSIS — I4819 Other persistent atrial fibrillation: Secondary | ICD-10-CM

## 2013-08-19 NOTE — Patient Instructions (Signed)
The current medical regimen is effective;  continue present plan and medications.  Follow up in 1 year with Dr. Hochrein in the [place] office.  You will receive a letter in the mail 2 months before you are due.  Please call us when you receive this letter to schedule your follow up appointment.  

## 2013-08-19 NOTE — Progress Notes (Signed)
HPI The patient returns for followup of atrial fibrillation and previous cardiomyopathy Since I last saw him he has had no new complaints. He denies any palpitations, presyncope or syncope. He has had no chest pressure, neck or arm discomfort. He has had no new shortness of breath, PND or orthopnea.  He still does his yardwork.  He is retired.  He does not feel his atrial fibrillation. He's had no trouble taking a blood thinner and gets it checked routinely. He denies any presyncope or syncope.  Allergies  Allergen Reactions  . Penicillins Hives    Blisters on hands  . Statins     Joint and muscle pain and low energy.    Current Outpatient Prescriptions  Medication Sig Dispense Refill  . amLODipine (NORVASC) 2.5 MG tablet TAKE 1 TABLET BY MOUTH DAILY  90 tablet  0  . carvedilol (COREG) 25 MG tablet TAKE 1 TABLET TWICE A DAY WITH MEALS  180 tablet  0  . colesevelam (WELCHOL) 625 MG tablet Take 1,875 mg by mouth 3 (three) times daily.       Marland Kitchen ezetimibe (ZETIA) 10 MG tablet Take 10 mg by mouth daily.        . fenofibrate 160 MG tablet TAKE 1 TABLET DAILY  90 tablet  0  . furosemide (LASIX) 20 MG tablet TAKE ONE-HALF (1/2) TABLET DAILY  45 tablet  10  . glipiZIDE (GLUCOTROL) 10 MG tablet Take 10 mg by mouth daily.        Marland Kitchen LANOXIN 125 MCG tablet TAKE 1 TABLET DAILY  90 tablet  3  . lisinopril (PRINIVIL,ZESTRIL) 40 MG tablet TAKE 1 TABLET TWICE A DAY  180 tablet  3  . Multiple Vitamin (MULTIVITAMIN) tablet Take 1 tablet by mouth daily.      . Omega-3 Fatty Acids (FISH OIL) 1200 MG CAPS Take by mouth 3 (three) times daily.      . saxagliptin HCl (ONGLYZA) 2.5 MG TABS tablet Take by mouth daily.        Marland Kitchen warfarin (COUMADIN) 7.5 MG tablet Take 7.5 mg by mouth daily.         No current facility-administered medications for this visit.    Past Medical History  Diagnosis Date  . Cardiomyopathy     EF 35% in the past--Improved to 55% echo 2/10. Moderately severe mitral regurgitation in the  past, now improved,  . Atrial fibrillation     permanent  . Coronary artery disease     non obstructive  . Diabetes mellitus   . Dyslipidemia   . COPD (chronic obstructive pulmonary disease)   . History of tobacco abuse   . Nephrolithiasis   . Carotid artery stenosis   . Anemia     Past Surgical History  Procedure Laterality Date  . Tonsillectomy    . Left carotid endarterectomy    . Carotid endarterectomy Left 10-21-05    cea    ROS:  As stated in the HPI and negative for all other systems.  PHYSICAL EXAM BP 148/84  Pulse 85  Ht 5\' 6"  (1.676 m)  Wt 180 lb (81.647 kg)  BMI 29.07 kg/m2 GENERAL:  Well appearing HEENT:  Pupils equal round and reactive, fundi not visualized, oral mucosa unremarkable, dentures NECK:  No jugular venous distention, waveform within normal limits, carotid upstroke brisk and symmetric, no bruits, no thyromegaly LYMPHATICS:  No cervical, inguinal adenopathy LUNGS:  Clear to auscultation bilaterally BACK:  No CVA tenderness CHEST:  Unremarkable HEART:  PMI  not displaced or sustained,S1 and S2 within normal limits, no S3,  no clicks, no rubs, no murmurs, irregular ABD:  Flat, positive bowel sounds normal in frequency in pitch, no bruits, no rebound, no guarding, no midline pulsatile mass, no hepatomegaly, no splenomegaly EXT:  2 plus pulses throughout, no edema, no cyanosis no clubbing NEURO:  Cranial nerves II through XII grossly intact, motor grossly intact throughout PSYCH:  Cognitively intact, oriented to person place and time  EKG:  Atrial fibrillation 81, axis within normal limits, intervals within normal limits, no acute ST-T wave changes. 08/19/2013   ASSESSMENT AND PLAN  ATRIAL FIBRILLATION:  He has no symptoms related to this. No change in therapy is indicated. He will continue with Coumadin and has no interest in NOACs  CARDIOMYOPATHY:  His EF is much improved at the last echo. He has no new symptoms. No change in therapy is  indicated.  CAROTID STENOSIS:  This was done recently and is followed by Dr. Donnetta Hutching.   HTN:  The blood pressure is at target. No change in medications is indicated. We will continue with therapeutic lifestyle changes (TLC).

## 2013-09-05 ENCOUNTER — Other Ambulatory Visit: Payer: Self-pay | Admitting: Cardiology

## 2013-10-05 ENCOUNTER — Other Ambulatory Visit: Payer: Self-pay | Admitting: Cardiology

## 2013-11-18 ENCOUNTER — Other Ambulatory Visit: Payer: Self-pay | Admitting: Cardiology

## 2014-01-27 ENCOUNTER — Other Ambulatory Visit: Payer: Self-pay | Admitting: Cardiology

## 2014-01-28 NOTE — Telephone Encounter (Signed)
Rx was sent to pharmacy electronically. 

## 2014-02-08 ENCOUNTER — Encounter: Payer: Self-pay | Admitting: Family

## 2014-02-09 ENCOUNTER — Ambulatory Visit (INDEPENDENT_AMBULATORY_CARE_PROVIDER_SITE_OTHER): Payer: Medicare Other | Admitting: Family

## 2014-02-09 ENCOUNTER — Ambulatory Visit (HOSPITAL_COMMUNITY)
Admission: RE | Admit: 2014-02-09 | Discharge: 2014-02-09 | Disposition: A | Discharge status: Home / Self Care | Payer: Medicare Other | Source: Ambulatory Visit | Attending: Family | Admitting: Family

## 2014-02-09 ENCOUNTER — Encounter: Payer: Self-pay | Admitting: Family

## 2014-02-09 VITALS — BP 159/88 | HR 82 | Resp 16 | Ht 66.0 in | Wt 187.0 lb

## 2014-02-09 DIAGNOSIS — Z48812 Encounter for surgical aftercare following surgery on the circulatory system: Secondary | ICD-10-CM | POA: Diagnosis not present

## 2014-02-09 DIAGNOSIS — Z9889 Other specified postprocedural states: Secondary | ICD-10-CM

## 2014-02-09 DIAGNOSIS — I6529 Occlusion and stenosis of unspecified carotid artery: Secondary | ICD-10-CM

## 2014-02-09 DIAGNOSIS — I6521 Occlusion and stenosis of right carotid artery: Secondary | ICD-10-CM

## 2014-02-09 DIAGNOSIS — I6523 Occlusion and stenosis of bilateral carotid arteries: Secondary | ICD-10-CM | POA: Diagnosis present

## 2014-02-09 NOTE — Patient Instructions (Signed)
Stroke Prevention Some medical conditions and behaviors are associated with an increased chance of having a stroke. You may prevent a stroke by making healthy choices and managing medical conditions. HOW CAN I REDUCE MY RISK OF HAVING A STROKE?   Stay physically active. Get at least 30 minutes of activity on most or all days.  Do not smoke. It may also be helpful to avoid exposure to secondhand smoke.  Limit alcohol use. Moderate alcohol use is considered to be:  No more than 2 drinks per day for men.  No more than 1 drink per day for nonpregnant women.  Eat healthy foods. This involves:  Eating 5 or more servings of fruits and vegetables a day.  Making dietary changes that address high blood pressure (hypertension), high cholesterol, diabetes, or obesity.  Manage your cholesterol levels.  Making food choices that are high in fiber and low in saturated fat, trans fat, and cholesterol may control cholesterol levels.  Take any prescribed medicines to control cholesterol as directed by your health care provider.  Manage your diabetes.  Controlling your carbohydrate and sugar intake is recommended to manage diabetes.  Take any prescribed medicines to control diabetes as directed by your health care provider.  Control your hypertension.  Making food choices that are low in salt (sodium), saturated fat, trans fat, and cholesterol is recommended to manage hypertension.  Take any prescribed medicines to control hypertension as directed by your health care provider.  Maintain a healthy weight.  Reducing calorie intake and making food choices that are low in sodium, saturated fat, trans fat, and cholesterol are recommended to manage weight.  Stop drug abuse.  Avoid taking birth control pills.  Talk to your health care provider about the risks of taking birth control pills if you are over 35 years old, smoke, get migraines, or have ever had a blood clot.  Get evaluated for sleep  disorders (sleep apnea).  Talk to your health care provider about getting a sleep evaluation if you snore a lot or have excessive sleepiness.  Take medicines only as directed by your health care provider.  For some people, aspirin or blood thinners (anticoagulants) are helpful in reducing the risk of forming abnormal blood clots that can lead to stroke. If you have the irregular heart rhythm of atrial fibrillation, you should be on a blood thinner unless there is a good reason you cannot take them.  Understand all your medicine instructions.  Make sure that other conditions (such as anemia or atherosclerosis) are addressed. SEEK IMMEDIATE MEDICAL CARE IF:   You have sudden weakness or numbness of the face, arm, or leg, especially on one side of the body.  Your face or eyelid droops to one side.  You have sudden confusion.  You have trouble speaking (aphasia) or understanding.  You have sudden trouble seeing in one or both eyes.  You have sudden trouble walking.  You have dizziness.  You have a loss of balance or coordination.  You have a sudden, severe headache with no known cause.  You have new chest pain or an irregular heartbeat. Any of these symptoms may represent a serious problem that is an emergency. Do not wait to see if the symptoms will go away. Get medical help at once. Call your local emergency services (911 in [place] Do not drive yourself to the hospital. Document Released: 03/20/2004 Document Revised: 06/27/2013 Document Reviewed: 08/13/2012 Exit[employer] Patient Information 2015 [employer]. This information is not intended to replace advice given   to you by your health care provider. Make sure you discuss any questions you have with your health care provider.  

## 2014-02-09 NOTE — Progress Notes (Signed)
Established Carotid Patient   History of Present Illness  Oscar [name] is a 73 y.o. male who is status post left carotid endarterectomy by Dr. Donnetta Hutching in 2007. He had severe asymptomatic stenosis at that time.  He returns today for carotid artery surveillance and follow up.  Dr. Donnetta Hutching documents in June, 2014 that recent (outside facility) duplex showed 60-79% right carotid stenosis and no significant restenosis of the left.  States he is physically active in the house and yard and occasionally caring for his great grandchildren.  He has a history of CHF.  Patient has Negative history of TIA or stroke symptom. The patient denies amaurosis fugax or monocular blindness. The patient denies facial drooping.  Pt. denies hemiplegia. The patient denies receptive or expressive aphasia. Pt. denies extremity weakness.  He denies claudication symptoms, denies non-healing wounds.  Patient denies New Medical or Surgical History.  He mows about 1-1.5 acres with a push mower.  Pt Diabetic: Yes, states in good control, A1C of 6.7  Pt smoker: former smoker, quit in 2002   Pt meds include:  Statin : No: statins cause myalgias and arthralgias  Betablocker: Yes  ASA: No  Other anticoagulants/antiplatelets: coumadin for atrial fib.  Past Medical History  Diagnosis Date  . Cardiomyopathy     EF 35% in the past--Improved to 55% echo 2/10. Moderately severe mitral regurgitation in the past, now improved,  . Atrial fibrillation     permanent  . Coronary artery disease     non obstructive  . Diabetes mellitus   . Dyslipidemia   . COPD (chronic obstructive pulmonary disease)   . History of tobacco abuse   . Nephrolithiasis   . Carotid artery stenosis   . Anemia     Social History History  Substance Use Topics  . Smoking status: Former Smoker    Types: Cigarettes    Quit date: 02/25/2000  . Smokeless tobacco: Former Systems developer  . Alcohol Use: No    Family History Family History   Problem Relation Age of Onset  . Coronary artery disease Mother   . Diabetes Mother   . Heart disease Mother     Before age 83 and  CHF  . Hypertension Mother   . Other Mother     varicose veins  . Heart attack Mother   . Coronary artery disease Other   . Heart disease Sister   . Hypertension Sister   . Heart attack Sister   . Heart disease Brother   . Hyperlipidemia Brother   . Hypertension Brother   . Other Brother     varicose veins  . Heart attack Brother   . Diabetes Daughter   . Hypertension Daughter   . Other Daughter     varicose veins  . Heart disease Son 46    Heart Disease before age 85- Open hear surgery  . Deep vein thrombosis Son     Surgical History Past Surgical History  Procedure Laterality Date  . Tonsillectomy    . Left carotid endarterectomy    . Carotid endarterectomy Left 10-21-05    cea    Allergies  Allergen Reactions  . Penicillins Hives    Blisters on hands  . Statins     Joint and muscle pain and low energy.    Current Outpatient Prescriptions  Medication Sig Dispense Refill  . amLODipine (NORVASC) 2.5 MG tablet TAKE 1 TABLET DAILY 90 tablet 3  . carvedilol (COREG) 25 MG tablet TAKE 1 TABLET TWICE A  DAY WITH MEALS 180 tablet 1  . colesevelam (WELCHOL) 625 MG tablet Take 1,875 mg by mouth 3 (three) times daily.     Marland Kitchen ezetimibe (ZETIA) 10 MG tablet Take 10 mg by mouth daily.      . fenofibrate 160 MG tablet TAKE 1 TABLET DAILY 90 tablet 0  . furosemide (LASIX) 20 MG tablet TAKE ONE-HALF (1/2) TABLET DAILY 45 tablet 1  . glipiZIDE (GLUCOTROL) 10 MG tablet Take 10 mg by mouth daily.      Marland Kitchen LANOXIN 125 MCG tablet TAKE 1 TABLET DAILY 90 tablet 2  . lisinopril (PRINIVIL,ZESTRIL) 40 MG tablet TAKE 1 TABLET TWICE A DAY 180 tablet 2  . Multiple Vitamin (MULTIVITAMIN) tablet Take 1 tablet by mouth daily.    . Omega-3 Fatty Acids (FISH OIL) 1200 MG CAPS Take by mouth 3 (three) times daily.    . saxagliptin HCl (ONGLYZA) 2.5 MG TABS tablet Take  by mouth daily.      Marland Kitchen warfarin (COUMADIN) 7.5 MG tablet Take 7.5 mg by mouth daily.       No current facility-administered medications for this visit.    Review of Systems : See HPI for pertinent positives and negatives.  Physical Examination  Filed Vitals:   02/09/14 1553 02/09/14 1555  BP: 147/85 159/88  Pulse: 63 82  Resp:  16  Height:  5\' 6"  (1.676 m)  Weight:  187 lb (84.823 kg)  SpO2:  98%   Body mass index is 30.2 kg/(m^2).  General: WDWN obese male in NAD  GAIT: normal  Eyes: PERRLA  Pulmonary: CTAB, Negative Rales, Negative rhonchi, & Negative wheezing.  Cardiac: irregular Rhythm.   VASCULAR EXAM  Carotid Bruits  Left  Right    Negative  Negative    Radial pulses are 3+ palpable and equal. Aorta is not palpable   LE Pulses  LEFT  RIGHT   POPLITEAL  not palpable  not palpable    Gastrointestinal: soft, nontender, BS WNL, no r/g, no palpable masses.  Musculoskeletal: Negative muscle atrophy/wasting. M/S 5/5 throughout, Extremities without ischemic changes.  Neurologic: A&O X 3; Appropriate Affect ; SENSATION ;normal;  Speech is normal  CN 2-12 intact, Pain and light touch intact in extremities, Motor exam as listed above.    Non-Invasive Vascular Imaging CAROTID DUPLEX 02/09/2014   CEREBROVASCULAR DUPLEX EVALUATION    INDICATION: Carotid stenosis    PREVIOUS INTERVENTION(S): Left carotid endarterectomy 2007    DUPLEX EXAM:     RIGHT  LEFT  Peak Systolic Velocities (cm/s) End Diastolic Velocities (cm/s) Plaque LOCATION Peak Systolic Velocities (cm/s) End Diastolic Velocities (cm/s) Plaque  83 9  CCA PROXIMAL 87 9   45 9  CCA MID 90/114 11/16   61 12 CP CCA DISTAL 148 20 HM  121 14 CP ECA 151 13   294 69 CP ICA PROXIMAL 57 8   65 15  ICA MID 79 17   66 21  ICA DISTAL 63 20     6.53 ICA / CCA Ratio (PSV) carotid endarterectomy  Antegrade Vertebral Flow Antegrade  086 Brachial Systolic Pressure (mmHg) 761  Triphasic  Brachial Artery Waveforms Triphasic    Plaque Morphology:  HM = Homogeneous, HT = Heterogeneous, CP = Calcific Plaque, SP = Smooth Plaque, IP = Irregular Plaque  ADDITIONAL FINDINGS:     IMPRESSION: Right internal carotid artery stenosis present in the 60%-79% range, which may be underestimated due to calcific plaque making Doppler interrogation difficult. Left internal carotid artery is patent  with history of carotid endarterectomy, no hyperplasia or hemodynamically significant plaque present.    Compared to the previous exam:  Unchanged since previous study on 08/11/2013.      Assessment: Oscar [name] is a 73 y.o. male who is status post left carotid endarterectomy in 2007.  He presents with asymptomatic 60%-79% right ICA stenosis which may be underestimated due to calcific plaque making Doppler interrogation difficult, and a patent left ICA with history of carotid endarterectomy, no hyperplasia or hemodynamically significant plaque present. Unchanged since previous study on 08/11/2013. He continues on coumadin for a-fib.  Plan: Follow-up in 6 months with Carotid Duplex.   I discussed in depth with the patient the nature of atherosclerosis, and emphasized the importance of maximal medical management including strict control of blood pressure, blood glucose, and lipid levels, obtaining regular exercise, and continued cessation of smoking.  The patient is aware that without maximal medical management the underlying atherosclerotic disease process will progress, limiting the benefit of any interventions. The patient was given information about stroke prevention and what symptoms should prompt the patient to seek immediate medical care. Thank you for allowing [place] to participate in this patient's care.  Clemon Chambers, RN, MSN, FNP-C [facility] Office: [phone]  Clinic Physician: Oneida Alar  02/09/2014 3:38 PM

## 2014-02-10 NOTE — Addendum Note (Signed)
Addended by: Dorthula Rue L on: 02/10/2014 04:35 PM   Modules accepted: Orders

## 2014-04-19 ENCOUNTER — Other Ambulatory Visit: Payer: Self-pay | Admitting: *Deleted

## 2014-04-19 MED ORDER — AMLODIPINE BESYLATE 2.5 MG PO TABS: 2.5000 mg | ORAL_TABLET | Freq: Every day | ORAL | Status: DC

## 2014-05-21 ENCOUNTER — Other Ambulatory Visit: Payer: Self-pay | Admitting: Cardiology

## 2014-05-26 ENCOUNTER — Other Ambulatory Visit: Payer: Self-pay | Admitting: Cardiology

## 2014-06-16 ENCOUNTER — Ambulatory Visit (INDEPENDENT_AMBULATORY_CARE_PROVIDER_SITE_OTHER): Payer: Medicare Other | Admitting: Neurology

## 2014-06-16 ENCOUNTER — Encounter: Payer: Self-pay | Admitting: Neurology

## 2014-06-16 VITALS — BP 131/78 | HR 64 | Ht 66.0 in | Wt 185.0 lb

## 2014-06-16 DIAGNOSIS — G250 Essential tremor: Secondary | ICD-10-CM

## 2014-06-16 NOTE — Progress Notes (Signed)
PATIENT: Oscar [name] DOB: 10/18/1940  HISTORICAL  Oscar [name] is a 74 years old right-handed male, referred by his primary care physician Dr. Edrick Oh for evaluation of bilateral hands tremor He has past medical history of hypertension, hyperlipidemia, diabetes, poorly controlled, most recent A1c was 9.2, could not tolerate stent in treatment, most recent cholesterol was 178, triglycerides right was 556, congestive heart failure, atrial fibrillation, on chronic Coumadin treatment,  He has strong family history of essential tremor, his mother, 72 other siblings all suffered tremor, his children at age thirties, began to notice mild bilateral hands tremor  Himself began to experience lateral hands tremor since age 89, gradually getting worse over the years, has to retire at age 11 because of worsening bilateral hands tremor, 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 Penicillins Hives    Blisters on hands  . Statins     Joint and muscle pain and low energy.    HOME MEDICATIONS: Current Outpatient Prescriptions  Medication Sig Dispense Refill  . albuterol (PROAIR HFA) 108 (90 BASE) MCG/ACT inhaler Inhale 2 puffs into the lungs.    Marland Kitchen amLODipine (NORVASC) 2.5 MG tablet Take 1 tablet (2.5 mg total) by mouth daily. 30 tablet 1  . carvedilol (COREG) 25 MG tablet TAKE 1 TABLET TWICE A DAY WITH MEALS 180 tablet 1  . colesevelam (WELCHOL) 625 MG tablet Take 1,875 mg by mouth 3 (three) times daily.     . digoxin (LANOXIN) 0.125 MG tablet TAKE 1 TABLET DAILY 90 tablet 1  . ezetimibe (ZETIA) 10 MG tablet Take 10 mg by mouth daily.      . fenofibrate 160 MG tablet TAKE 1 TABLET DAILY 90 tablet 0  . furosemide (LASIX) 20 MG tablet TAKE ONE-HALF (1/2) TABLET DAILY 45 tablet 1  . glipiZIDE (GLUCOTROL  XL) 10 MG 24 hr tablet TAKE 1 TABLET BY MOUTH TWICE DAILY    . linagliptin (TRADJENTA) 5 MG TABS tablet Take 5 mg by mouth.    Marland Kitchen lisinopril (PRINIVIL,ZESTRIL) 40 MG tablet TAKE 1 TABLET TWICE A DAY 180 tablet 1  . metFORMIN (GLUCOPHAGE-XR) 500 MG 24 hr tablet Take 500 mg by mouth daily with breakfast.    . metFORMIN (GLUMETZA) 500 MG (MOD) 24 hr tablet Take 500 mg by mouth daily with breakfast.    . Multiple Vitamin (MULTIVITAMIN) tablet Take 1 tablet by mouth daily.    . Omega-3 Fatty Acids (FISH OIL) 1200 MG CAPS Take by mouth 3 (three) times daily.    Marland Kitchen warfarin (COUMADIN) 7.5 MG tablet Take 7.5 mg by mouth daily.         PAST MEDICAL HISTORY: Past Medical History  Diagnosis Date  . Cardiomyopathy     EF 35% in the past--Improved to 55% echo 2/10. Moderately severe mitral regurgitation in the past, now improved,  . Atrial fibrillation     permanent  . Coronary artery disease     non obstructive  . Diabetes mellitus   . Dyslipidemia   . COPD (chronic obstructive pulmonary disease)   . History of tobacco abuse   . Nephrolithiasis   . Carotid artery stenosis   . Anemia   . Myocardial infarction 2002  . CHF (congestive heart failure) 2002  . Tremors of nervous system     PAST SURGICAL  HISTORY: Past Surgical History  Procedure Laterality Date  . Tonsillectomy    . Left carotid endarterectomy    . Carotid endarterectomy Left 10-21-05    cea    FAMILY HISTORY: Family History  Problem Relation Age of Onset  . Coronary artery disease Mother   . Diabetes Mother   . Heart disease Mother     Before age 109 and  CHF  . Hypertension Mother   . Other Mother     varicose veins  . Heart attack Mother   . Coronary artery disease Other   . Heart disease Sister   . Hypertension Sister   . Heart attack Sister   . Heart disease Brother   . Hyperlipidemia Brother   . Hypertension Brother   . Other Brother     varicose veins  . Heart attack Brother   . Diabetes Daughter   .  Hypertension Daughter   . Other Daughter     varicose veins  . Heart disease Son 44    Heart Disease before age 37- Open hear surgery  . Deep vein thrombosis Son   . Heart attack Son     SOCIAL HISTORY:  History   Social History  . Marital Status: Married    Spouse Name: N/A  . Number of Children: 5  . Years of Education: 12   Occupational History  . Retired    Social History Main Topics  . Smoking status: Former Smoker    Types: Cigarettes    Quit date: 02/25/2000  . Smokeless tobacco: Former Systems developer  . Alcohol Use: No  . Drug Use: No  . Sexual Activity: Not on file   Other Topics Concern  . Not on file   Social History Narrative   Lives at home with his wife.   Right-handed.   3-4 cups caffeine per day.     PHYSICAL EXAM   Filed Vitals:   06/16/14 1115  BP: 131/78  Pulse: 64  Height: 5\' 6"  (1.676 m)  Weight: 185 lb (83.915 kg)    Not recorded      Body mass index is 29.87 kg/(m^2).  PHYSICAL EXAMNIATION:  Gen: NAD, conversant, well nourised, obese, well groomed                     Cardiovascular: Regular rate rhythm, no peripheral edema, warm, nontender. Eyes: Conjunctivae clear without exudates or hemorrhage Neck: Supple, no carotid bruise. Pulmonary: Clear to auscultation bilaterally   NEUROLOGICAL EXAM:  MENTAL STATUS: Speech:    Speech is normal; fluent and spontaneous with normal comprehension.  Cognition:    The patient is oriented to person, place, and time;     recent and remote memory intact;     language fluent;     normal attention, concentration,     fund of knowledge.  CRANIAL NERVES: CN II: Visual fields are full to confrontation. Fundoscopic exam is normal with sharp discs and no vascular changes. Venous pulsations are present bilaterally. Pupils are 4 mm and briskly reactive to light. Visual acuity is 20/20 bilaterally. CN III, IV, VI: extraocular movement are normal. No ptosis. CN V: Facial sensation is intact to pinprick  in all 3 divisions bilaterally. Corneal responses are intact.  CN VII: Face is symmetric with normal eye closure and smile. CN VIII: Hearing is normal to rubbing fingers CN IX, X: Palate elevates symmetrically. Phonation is normal. CN XI: Head turning and shoulder shrug are intact CN XII: Tongue is midline  with normal movements and no atrophy.  MOTOR:  Bilateral hands Action tremor, no rigidity, Muscle bulk and tone are normal. Muscle strength is normal.   Shoulder abduction Shoulder external rotation Elbow flexion Elbow extension Wrist flexion Wrist extension Finger abduction Hip flexion Knee flexion Knee extension Ankle dorsi flexion Ankle plantar flexion  R 5 5 5 5 5 5 5 5 5 5 5 5   L 5 5 5 5 5 5 5 5 5 5 5 5     REFLEXES: Reflexes are 2+ and symmetric at the biceps, triceps, knees, and ankles. Plantar responses are flexor.  SENSORY: Light touch, pinprick, position sense, and vibration sense are intact in fingers and toes.  COORDINATION: Rapid alternating movements and fine finger movements are intact. There is no dysmetria on finger-to-nose and heel-knee-shin. There are no abnormal or extraneous movements.   GAIT/STANCE: Posture is normal. Gait is steady with normal steps, base, arm swing, and turning. Heel and toe walking are normal. Tandem gait is normal.  Romberg is absent.   DIAGNOSTIC DATA (LABS, IMAGING, TESTING) - I reviewed patient records, labs, notes, testing and imaging myself where available.  ASSESSMENT AND PLAN  Oscar [name] is a 74 y.o. male  with strong family history of essential tremor, presenting with chronic bilateral hands tremor progressively worse over the past few years, difficulty using utensils, signing his names,   He has complicated medical history, including atrial fibrillation, coronary artery disease, congestive heart failure, carotid artery disease, hypertension, poorly controlled diabetes, hyperlipidemia, on polypharmacy treatment,  Beta  blocker, in specific, Inderal would be a better option for his essential tremor, may make adjustment by his cardiology Dr. Minus Breeding, or his primary care physician Dr. Edrick Oh, he is currently taking coreg  25mg  bid.  Check TSh  RTC in 3 months   Marcial Pacas, M.D. Ph.D.  [facility] [address] [place], [place] [address] Ph: [phone] Fax: [phone]

## 2014-06-17 LAB — TSH: TSH: 0.997 u[IU]/mL (ref 0.450–4.500)

## 2014-07-12 ENCOUNTER — Other Ambulatory Visit: Payer: Self-pay | Admitting: Cardiology

## 2014-08-11 ENCOUNTER — Encounter: Payer: Self-pay | Admitting: Family

## 2014-08-15 ENCOUNTER — Encounter: Payer: Self-pay | Admitting: Family

## 2014-08-15 ENCOUNTER — Ambulatory Visit (HOSPITAL_COMMUNITY)
Admission: RE | Admit: 2014-08-15 | Discharge: 2014-08-15 | Disposition: A | Discharge status: Home / Self Care | Payer: Medicare Other | Source: Ambulatory Visit | Attending: Family | Admitting: Family

## 2014-08-15 ENCOUNTER — Ambulatory Visit (INDEPENDENT_AMBULATORY_CARE_PROVIDER_SITE_OTHER): Payer: Medicare Other | Admitting: Family

## 2014-08-15 VITALS — BP 126/66 | HR 60 | Resp 14 | Ht 66.0 in | Wt 185.0 lb

## 2014-08-15 DIAGNOSIS — Z48812 Encounter for surgical aftercare following surgery on the circulatory system: Secondary | ICD-10-CM

## 2014-08-15 DIAGNOSIS — I6521 Occlusion and stenosis of right carotid artery: Secondary | ICD-10-CM | POA: Diagnosis not present

## 2014-08-15 DIAGNOSIS — Z9889 Other specified postprocedural states: Secondary | ICD-10-CM

## 2014-08-15 NOTE — Patient Instructions (Signed)
Stroke Prevention Some medical conditions and behaviors are associated with an increased chance of having a stroke. You may prevent a stroke by making healthy choices and managing medical conditions. HOW CAN I REDUCE MY RISK OF HAVING A STROKE?   Stay physically active. Get at least 30 minutes of activity on most or all days.  Do not smoke. It may also be helpful to avoid exposure to secondhand smoke.  Limit alcohol use. Moderate alcohol use is considered to be:  No more than 2 drinks per day for men.  No more than 1 drink per day for nonpregnant women.  Eat healthy foods. This involves:  Eating 5 or more servings of fruits and vegetables a day.  Making dietary changes that address high blood pressure (hypertension), high cholesterol, diabetes, or obesity.  Manage your cholesterol levels.  Making food choices that are high in fiber and low in saturated fat, trans fat, and cholesterol may control cholesterol levels.  Take any prescribed medicines to control cholesterol as directed by your health care provider.  Manage your diabetes.  Controlling your carbohydrate and sugar intake is recommended to manage diabetes.  Take any prescribed medicines to control diabetes as directed by your health care provider.  Control your hypertension.  Making food choices that are low in salt (sodium), saturated fat, trans fat, and cholesterol is recommended to manage hypertension.  Take any prescribed medicines to control hypertension as directed by your health care provider.  Maintain a healthy weight.  Reducing calorie intake and making food choices that are low in sodium, saturated fat, trans fat, and cholesterol are recommended to manage weight.  Stop drug abuse.  Avoid taking birth control pills.  Talk to your health care provider about the risks of taking birth control pills if you are over 35 years old, smoke, get migraines, or have ever had a blood clot.  Get evaluated for sleep  disorders (sleep apnea).  Talk to your health care provider about getting a sleep evaluation if you snore a lot or have excessive sleepiness.  Take medicines only as directed by your health care provider.  For some people, aspirin or blood thinners (anticoagulants) are helpful in reducing the risk of forming abnormal blood clots that can lead to stroke. If you have the irregular heart rhythm of atrial fibrillation, you should be on a blood thinner unless there is a good reason you cannot take them.  Understand all your medicine instructions.  Make sure that other conditions (such as anemia or atherosclerosis) are addressed. SEEK IMMEDIATE MEDICAL CARE IF:   You have sudden weakness or numbness of the face, arm, or leg, especially on one side of the body.  Your face or eyelid droops to one side.  You have sudden confusion.  You have trouble speaking (aphasia) or understanding.  You have sudden trouble seeing in one or both eyes.  You have sudden trouble walking.  You have dizziness.  You have a loss of balance or coordination.  You have a sudden, severe headache with no known cause.  You have new chest pain or an irregular heartbeat. Any of these symptoms may represent a serious problem that is an emergency. Do not wait to see if the symptoms will go away. Get medical help at once. Call your local emergency services (911 in [place] Do not drive yourself to the hospital. Document Released: 03/20/2004 Document Revised: 06/27/2013 Document Reviewed: 08/13/2012 Exit[employer] Patient Information 2015 [employer]. This information is not intended to replace advice given   to you by your health care provider. Make sure you discuss any questions you have with your health care provider.  

## 2014-08-15 NOTE — Progress Notes (Signed)
Established Carotid Patient   History of Present Illness  Oscar [name] is a 74 y.o. male who is status post left carotid endarterectomy by Dr. Donnetta Hutching in 2007. He had severe asymptomatic stenosis at that time.  He returns today for carotid artery surveillance and follow up.  Dr. Donnetta Hutching documents in June, 2014 that recent (outside facility) duplex showed 60-79% right carotid stenosis and no significant restenosis of the left.  States he is physically active in the house and yard and occasionally caring for his great grandchildren.  He has a history of CHF.  Patient has no history of TIA or stroke; specifically he denies amaurosis fugax or monocular blindness, unilateral facial drooping, hemiparesis, and receptive or expressive aphasia.  He denies claudication symptoms, denies non-healing wounds.  Patient denies New Medical or Surgical History.  He mows about 1-1.5 acres with a push mower.  Pt Diabetic: Yes, pt reports A1C of 9.2, up from 6.7, admits to severe dietary indiscretions and has since improved   Pt smoker: former smoker, quit in 2002   Pt meds include:  Statin : No: statins cause myalgias and arthralgias  Betablocker: Yes  ASA: No  Other anticoagulants/antiplatelets: coumadin for atrial fib.   Past Medical History  Diagnosis Date  . Cardiomyopathy     EF 35% in the past--Improved to 55% echo 2/10. Moderately severe mitral regurgitation in the past, now improved,  . Atrial fibrillation     permanent  . Coronary artery disease     non obstructive  . Diabetes mellitus   . Dyslipidemia   . COPD (chronic obstructive pulmonary disease)   . History of tobacco abuse   . Nephrolithiasis   . Carotid artery stenosis   . Anemia   . Myocardial infarction 2002  . CHF (congestive heart failure) 2002  . Tremors of nervous system     Social History History  Substance Use Topics  . Smoking status: Former Smoker    Types: Cigarettes    Quit date: 02/25/2000  .  Smokeless tobacco: Former Systems developer  . Alcohol Use: No    Family History Family History  Problem Relation Age of Onset  . Coronary artery disease Mother   . Diabetes Mother   . Heart disease Mother     Before age 95 and  CHF  . Hypertension Mother   . Other Mother     varicose veins  . Heart attack Mother   . Coronary artery disease Other   . Heart disease Sister   . Hypertension Sister   . Heart attack Sister   . Heart disease Brother   . Hyperlipidemia Brother   . Hypertension Brother   . Other Brother     varicose veins  . Heart attack Brother   . Diabetes Daughter   . Hypertension Daughter   . Other Daughter     varicose veins  . Heart disease Son 21    Heart Disease before age 27- Open hear surgery  . Deep vein thrombosis Son   . Heart attack Son     Surgical History Past Surgical History  Procedure Laterality Date  . Tonsillectomy    . Left carotid endarterectomy    . Carotid endarterectomy Left 10-21-05    cea    Allergies  Allergen Reactions  . Penicillins Hives    Blisters on hands  . Statins     Joint and muscle pain and low energy.    Current Outpatient Prescriptions  Medication Sig Dispense Refill  .  albuterol (PROAIR HFA) 108 (90 BASE) MCG/ACT inhaler Inhale 2 puffs into the lungs.    Marland Kitchen amLODipine (NORVASC) 2.5 MG tablet Take 1 tablet (2.5 mg total) by mouth daily. 30 tablet 1  . carvedilol (COREG) 25 MG tablet TAKE 1 TABLET TWICE A DAY WITH MEALS 180 tablet 0  . colesevelam (WELCHOL) 625 MG tablet Take 1,875 mg by mouth 3 (three) times daily.     . digoxin (LANOXIN) 0.125 MG tablet TAKE 1 TABLET DAILY 90 tablet 1  . ezetimibe (ZETIA) 10 MG tablet Take 10 mg by mouth daily.      . fenofibrate 160 MG tablet TAKE 1 TABLET DAILY 90 tablet 0  . furosemide (LASIX) 20 MG tablet TAKE ONE-HALF (1/2) TABLET DAILY 45 tablet 1  . glipiZIDE (GLUCOTROL XL) 10 MG 24 hr tablet TAKE 1 TABLET BY MOUTH TWICE DAILY    . linagliptin (TRADJENTA) 5 MG TABS tablet Take  5 mg by mouth.    Marland Kitchen lisinopril (PRINIVIL,ZESTRIL) 40 MG tablet TAKE 1 TABLET TWICE A DAY 180 tablet 1  . metFORMIN (GLUCOPHAGE-XR) 500 MG 24 hr tablet Take 500 mg by mouth daily with breakfast.    . metFORMIN (GLUMETZA) 500 MG (MOD) 24 hr tablet Take 500 mg by mouth daily with breakfast.    . Multiple Vitamin (MULTIVITAMIN) tablet Take 1 tablet by mouth daily.    . Omega-3 Fatty Acids (FISH OIL) 1200 MG CAPS Take by mouth 3 (three) times daily.    Marland Kitchen warfarin (COUMADIN) 7.5 MG tablet Take 7.5 mg by mouth daily.       No current facility-administered medications for this visit.    Review of Systems : See HPI for pertinent positives and negatives.  Physical Examination  Filed Vitals:   08/15/14 1521 08/15/14 1524  BP: 136/63 126/66  Pulse: 59 60  Resp:  14  Height:  5\' 6"  (1.676 m)  Weight:  185 lb (83.915 kg)  SpO2:  98%   Body mass index is 29.87 kg/(m^2).   General: WDWN obese male in NAD  GAIT: normal  Eyes: PERRLA  Pulmonary: CTAB, Negative Rales, Negative rhonchi, & Negative wheezing.  Cardiac: irregular Rhythm.   VASCULAR EXAM  Carotid Bruits  Left  Right    Negative  Negative    Radial pulses are 3+ palpable and equal. Aorta is not palpable  LE Pulses  LEFT  RIGHT   POPLITEAL  not palpable  not palpable    Gastrointestinal: soft, nontender, BS WNL, no r/g, no palpable masses.  Musculoskeletal: Negative muscle atrophy/wasting. M/S 5/5 throughout, Extremities without ischemic changes.  Neurologic: A&O X 3; Appropriate Affect, Speech is normal  CN 2-12 intact, Pain and light touch intact in extremities, Motor exam as listed above.         Non-Invasive Vascular Imaging CAROTID DUPLEX 08/15/2014   CEREBROVASCULAR DUPLEX EVALUATION    INDICATION: Carotid endarterectomy     PREVIOUS INTERVENTION(S): Left carotid endarterectomy in 2007    DUPLEX EXAM:     RIGHT  LEFT  Peak Systolic Velocities (cm/s) End Diastolic  Velocities (cm/s) Plaque LOCATION Peak Systolic Velocities (cm/s) End Diastolic Velocities (cm/s) Plaque  68 9  CCA PROXIMAL 69 10   60 8 HT CCA MID 125 13 HT  50 7 HT CCA DISTAL 130 15 HM  110 0 CP ECA 170 0   323 68 CP ICA PROXIMAL 55 8   60 14  ICA MID 82 16   65 15  ICA DISTAL 53 15  6.5 ICA / CCA Ratio (PSV) Not Calculated  Antegrade Vertebral Flow Antegrade  720 Brachial Systolic Pressure (mmHg) 947  Multiphasic (subclavian artery) Brachial Artery Waveforms Multiphasic (subclavian artery)    Plaque Morphology:  HM = Homogeneous, HT = Heterogeneous, CP = Calcific Plaque, SP = Smooth Plaque, IP = Irregular Plaque  ADDITIONAL FINDINGS: No significant stenosis of the bilateral external or common carotid arteries.    IMPRESSION: 1. Patent left carotid endarterectomy site with no left internal carotid artery stenosis. 2. Doppler velocities suggest a 60-79% stenosis of the right proximal internal carotid artery.    Compared to the previous exam:  No significant change noted when compared to the previous exam on 02/09/14.       Assessment: ZYHEIR DAFT is a 74 y.o. male who is status post left carotid endarterectomy in 2007. He has no history of stroke or TIA.  Today's carotid Duplex suggests a patent left carotid endarterectomy site with no left internal carotid artery stenosis and 60-79% stenosis of the right proximal internal carotid artery.  No significant change noted when compared to the previous exam on 02/09/14.    Plan: Follow-up in 6 months with Carotid Duplex.   I discussed in depth with the patient the nature of atherosclerosis, and emphasized the importance of maximal medical management including strict control of blood pressure, blood glucose, and lipid levels, obtaining regular exercise, and continued cessation of smoking.  The patient is aware that without maximal medical management the underlying atherosclerotic disease process will progress, limiting the  benefit of any interventions. The patient was given information about stroke prevention and what symptoms should prompt the patient to seek immediate medical care. Thank you for allowing [place] to participate in this patient's care.  Clemon Chambers, RN, MSN, FNP-C [facility] Office: [phone]  Clinic Physician: Early  08/15/2014 3:26 PM

## 2014-09-05 ENCOUNTER — Telehealth: Payer: Self-pay | Admitting: *Deleted

## 2014-09-05 NOTE — Telephone Encounter (Signed)
Patient called back and canceled with phone room.  He will call back to reschedule.

## 2014-09-05 NOTE — Telephone Encounter (Signed)
Left message for a return call.  We need to reschedule his appt on 09/18/14 to any available 15 minutes slot available. Dr. Krista Blue has to be in a meeting on the 25th.

## 2014-09-13 ENCOUNTER — Other Ambulatory Visit: Payer: Self-pay | Admitting: *Deleted

## 2014-09-13 MED ORDER — AMLODIPINE BESYLATE 2.5 MG PO TABS: 2.5000 mg | ORAL_TABLET | Freq: Every day | ORAL | Status: DC

## 2014-09-18 ENCOUNTER — Ambulatory Visit: Payer: Medicare Other | Admitting: Neurology

## 2014-10-09 ENCOUNTER — Other Ambulatory Visit: Payer: Self-pay | Admitting: Cardiology

## 2014-10-18 ENCOUNTER — Encounter: Payer: Self-pay | Admitting: Cardiology

## 2014-10-18 ENCOUNTER — Ambulatory Visit (INDEPENDENT_AMBULATORY_CARE_PROVIDER_SITE_OTHER): Payer: Medicare Other | Admitting: Cardiology

## 2014-10-18 VITALS — BP 130/70 | HR 65 | Ht 66.0 in | Wt 183.0 lb

## 2014-10-18 DIAGNOSIS — I481 Persistent atrial fibrillation: Secondary | ICD-10-CM

## 2014-10-18 DIAGNOSIS — I429 Cardiomyopathy, unspecified: Secondary | ICD-10-CM

## 2014-10-18 DIAGNOSIS — I6521 Occlusion and stenosis of right carotid artery: Secondary | ICD-10-CM

## 2014-10-18 DIAGNOSIS — I4819 Other persistent atrial fibrillation: Secondary | ICD-10-CM

## 2014-10-18 NOTE — Progress Notes (Signed)
HPI The patient returns for followup of atrial fibrillation and previous cardiomyopathy Since I last saw him he has had no new complaints. He denies any palpitations, presyncope or syncope. He has had no chest pressure, neck or arm discomfort. He has had no new shortness of breath, PND or orthopnea.  He still does his yardwork which includes push mowing headache or.  He is retired.  He does not feel his atrial fibrillation. He's had no trouble taking a blood thinner and gets it checked routinely. He denies any presyncope or syncope.  Allergies  Allergen Reactions  . Penicillins Hives    Blisters on hands  . Statins     Joint and muscle pain and low energy.    Current Outpatient Prescriptions  Medication Sig Dispense Refill  . albuterol (PROAIR HFA) 108 (90 BASE) MCG/ACT inhaler Inhale 2 puffs into the lungs.    Marland Kitchen amLODipine (NORVASC) 2.5 MG tablet Take 1 tablet (2.5 mg total) by mouth daily. 90 tablet 1  . carvedilol (COREG) 25 MG tablet Take 25 mg by mouth 2 (two) times daily with a meal.    . colesevelam (WELCHOL) 625 MG tablet Take 1,875 mg by mouth 3 (three) times daily.     . digoxin (LANOXIN) 0.125 MG tablet Take 0.125 mg by mouth daily.    Marland Kitchen ezetimibe (ZETIA) 10 MG tablet Take 10 mg by mouth daily.      . fenofibrate 160 MG tablet Take 160 mg by mouth daily.    . furosemide (LASIX) 20 MG tablet Take 1/2 tablet by mouth daily    . glipiZIDE (GLUCOTROL XL) 10 MG 24 hr tablet TAKE 1 TABLET BY MOUTH TWICE DAILY    . linagliptin (TRADJENTA) 5 MG TABS tablet Take 5 mg by mouth.    Marland Kitchen lisinopril (PRINIVIL,ZESTRIL) 40 MG tablet Take 40 mg by mouth 2 (two) times daily.    . metFORMIN (GLUCOPHAGE-XR) 500 MG 24 hr tablet 500 mg. Take two 500mg  tablets by mouth twice daily    . Multiple Vitamin (MULTIVITAMIN) tablet Take 1 tablet by mouth daily.    . Omega-3 Fatty Acids (FISH OIL) 1200 MG CAPS Take by mouth 3 (three) times daily.    Marland Kitchen warfarin (COUMADIN) 7.5 MG tablet Take 7.5 mg by mouth  daily.       No current facility-administered medications for this visit.    Past Medical History  Diagnosis Date  . Cardiomyopathy     EF 35% in the past--Improved to 55% echo 2/10. Moderately severe mitral regurgitation in the past, now improved,  . Atrial fibrillation     permanent  . Coronary artery disease     non obstructive  . Diabetes mellitus   . Dyslipidemia   . COPD (chronic obstructive pulmonary disease)   . History of tobacco abuse   . Nephrolithiasis   . Carotid artery stenosis   . Anemia   . CHF (congestive heart failure) 2002  . Tremors of nervous system     Past Surgical History  Procedure Laterality Date  . Tonsillectomy    . Left carotid endarterectomy    . Carotid endarterectomy Left 10-21-05    cea    ROS:  As stated in the HPI and negative for all other systems.  PHYSICAL EXAM BP 130/70 mmHg  Pulse 65  Ht 5\' 6"  (1.676 m)  Wt 183 lb (83.008 kg)  BMI 29.55 kg/m2 GENERAL:  Well appearing NECK:  No jugular venous distention, waveform within normal limits, carotid  upstroke brisk and symmetric, no bruits, no thyromegaly LUNGS:  Clear to auscultation bilaterally CHEST:  Unremarkable HEART:  PMI not displaced or sustained,S1 and S2 within normal limits, no S3,  no clicks, no rubs, no murmurs, irregular ABD:  Flat, positive bowel sounds normal in frequency in pitch, no bruits, no rebound, no guarding, no midline pulsatile mass, no hepatomegaly, no splenomegaly EXT:  2 plus pulses throughout, no edema, no cyanosis no clubbing EKG:  Atrial fibrillation 65, axis within normal limits, intervals within normal limits, no acute ST-T wave changes. 10/18/2014   ASSESSMENT AND PLAN  ATRIAL FIBRILLATION:  He has no symptoms related to this. No change in therapy is indicated. He will continue with Coumadin and has no interest in NOACs  CARDIOMYOPATHY:  Hiis ejection fraction was improved previously but it has been quite a while since I saw an echocardiogram. I  will repeat this for baseline.  CAROTID STENOSIS:  This is followed by Dr. Donnetta Hutching.   HTN:  The blood pressure is at target. No change in medications is indicated. We will continue with therapeutic lifestyle changes (TLC).

## 2014-10-18 NOTE — Patient Instructions (Signed)
Medication Instructions:  Your physician recommends that you continue on your current medications as directed. Please refer to the Current Medication list given to you today.  Testing/Procedures: Your physician has requested that you have an echocardiogram. Echocardiography is a painless test that uses sound waves to create images of your heart. It provides your doctor with information about the size and shape of your heart and how well your heart's chambers and valves are working. This procedure takes approximately one hour. There are no restrictions for this procedure.  Follow-Up: Follow up in 18 months with Dr. Percival Spanish.  You will receive a letter in the mail 2 months before you are due.  Please call us when you receive this letter to schedule your follow up appointment.  Thank you for choosing [facility]!!

## 2014-10-25 ENCOUNTER — Ambulatory Visit (INDEPENDENT_AMBULATORY_CARE_PROVIDER_SITE_OTHER): Payer: Medicare Other

## 2014-10-25 ENCOUNTER — Other Ambulatory Visit: Payer: Self-pay

## 2014-10-25 DIAGNOSIS — I4819 Other persistent atrial fibrillation: Secondary | ICD-10-CM

## 2014-10-25 DIAGNOSIS — I429 Cardiomyopathy, unspecified: Secondary | ICD-10-CM | POA: Diagnosis not present

## 2014-10-25 DIAGNOSIS — I481 Persistent atrial fibrillation: Secondary | ICD-10-CM

## 2014-11-17 ENCOUNTER — Other Ambulatory Visit: Payer: Self-pay | Admitting: Cardiology

## 2014-11-23 ENCOUNTER — Other Ambulatory Visit: Payer: Self-pay | Admitting: Cardiology

## 2015-01-26 ENCOUNTER — Encounter: Payer: Self-pay | Admitting: Vascular Surgery

## 2015-02-20 ENCOUNTER — Encounter: Payer: Self-pay | Admitting: Vascular Surgery

## 2015-02-20 ENCOUNTER — Ambulatory Visit (HOSPITAL_COMMUNITY)
Admission: RE | Admit: 2015-02-20 | Discharge: 2015-02-20 | Disposition: A | Discharge status: Home / Self Care | Payer: Medicare Other | Source: Ambulatory Visit | Attending: Vascular Surgery | Admitting: Vascular Surgery

## 2015-02-20 ENCOUNTER — Ambulatory Visit: Payer: Medicare Other | Admitting: Vascular Surgery

## 2015-02-20 DIAGNOSIS — Z48812 Encounter for surgical aftercare following surgery on the circulatory system: Secondary | ICD-10-CM

## 2015-02-20 DIAGNOSIS — I6521 Occlusion and stenosis of right carotid artery: Secondary | ICD-10-CM | POA: Diagnosis not present

## 2015-02-20 DIAGNOSIS — Z9889 Other specified postprocedural states: Secondary | ICD-10-CM | POA: Diagnosis not present

## 2015-02-20 DIAGNOSIS — E119 Type 2 diabetes mellitus without complications: Secondary | ICD-10-CM | POA: Insufficient documentation

## 2015-02-20 DIAGNOSIS — E785 Hyperlipidemia, unspecified: Secondary | ICD-10-CM | POA: Diagnosis not present

## 2015-02-27 ENCOUNTER — Ambulatory Visit (INDEPENDENT_AMBULATORY_CARE_PROVIDER_SITE_OTHER): Payer: Medicare Other | Admitting: Vascular Surgery

## 2015-02-27 ENCOUNTER — Encounter: Payer: Self-pay | Admitting: Vascular Surgery

## 2015-02-27 VITALS — BP 143/66 | HR 70 | Temp 98.0°F | Resp 20 | Ht 66.0 in | Wt 171.6 lb

## 2015-02-27 DIAGNOSIS — Z9889 Other specified postprocedural states: Secondary | ICD-10-CM | POA: Diagnosis not present

## 2015-02-27 DIAGNOSIS — Z48812 Encounter for surgical aftercare following surgery on the circulatory system: Secondary | ICD-10-CM

## 2015-02-27 DIAGNOSIS — I6521 Occlusion and stenosis of right carotid artery: Secondary | ICD-10-CM | POA: Diagnosis not present

## 2015-02-27 DIAGNOSIS — I6529 Occlusion and stenosis of unspecified carotid artery: Secondary | ICD-10-CM | POA: Insufficient documentation

## 2015-02-27 NOTE — Progress Notes (Signed)
Filed Vitals:   02/27/15 1423 02/27/15 1426 02/27/15 1429  BP: 149/80 151/65 143/66  Pulse: 70    Temp: 98 F (36.7 C)    TempSrc: Oral    Resp: 20    Height: 5\' 6"  (1.676 m)    Weight: 171 lb 9.6 oz (77.837 kg)

## 2015-02-27 NOTE — Progress Notes (Signed)
[facility]  Patient name: Oscar [name] MRN: [id number] DOB: [birth year] Sex: male  REASON FOR VISIT:  Follow-up of carotid disease  HPI: Oscar [name] is a 75 y.o. male  Me from a left carotid endarterectomy in 2007. He remains asymptomatic. He has known moderate to severe right carotid stenosis and has been followed in our office with serial ultrasounds regarding this. He has had slow progression over time. He remains asymptomatic. He is right-handed. He denies any amaurosis fugax, transient ischemic attack or stroke. He remained stable from a cardiac standpoint. He is in chronic atrial fibrillation and is on Coumadin for this.  Past Medical History  Diagnosis Date  . Cardiomyopathy     EF 35% in the past--Improved to 55% echo 2/10. Moderately severe mitral regurgitation in the past, now improved,  . Atrial fibrillation ([place])     permanent  . Coronary artery disease     non obstructive  . Diabetes mellitus   . Dyslipidemia   . COPD (chronic obstructive pulmonary disease) ([place])   . History of tobacco abuse   . Nephrolithiasis   . Carotid artery stenosis   . Anemia   . CHF (congestive heart failure) ([place]) 2002  . Tremors of nervous system     Family History  Problem Relation Age of Onset  . Coronary artery disease Mother   . Diabetes Mother   . Heart disease Mother     Before age 25 and  CHF  . Hypertension Mother   . Other Mother     varicose veins  . Heart attack Mother   . Coronary artery disease Other   . Heart disease Sister   . Hypertension Sister   . Heart attack Sister   . Heart disease Brother   . Hyperlipidemia Brother   . Hypertension Brother   . Other Brother     varicose veins  . Heart attack Brother   . Diabetes Daughter   . Hypertension Daughter   . Other Daughter     varicose veins  . Heart disease Son 83    Heart Disease before age 83- Open hear surgery  . Deep vein thrombosis Son   . Heart attack Son      SOCIAL HISTORY: Social History  Substance Use Topics  . Smoking status: Former Smoker    Types: Cigarettes    Quit date: 02/25/2000  . Smokeless tobacco: Former Systems developer  . Alcohol Use: No    Allergies  Allergen Reactions  . Penicillins Hives    Blisters on hands  . Statins     Joint and muscle pain and low energy.    Current Outpatient Prescriptions  Medication Sig Dispense Refill  . albuterol (PROAIR HFA) 108 (90 BASE) MCG/ACT inhaler Inhale 2 puffs into the lungs.    Marland Kitchen amLODipine (NORVASC) 2.5 MG tablet Take 1 tablet (2.5 mg total) by mouth daily. 90 tablet 1  . carvedilol (COREG) 25 MG tablet Take 25 mg by mouth 2 (two) times daily with a meal.    . colesevelam (WELCHOL) 625 MG tablet Take 1,875 mg by mouth 3 (three) times daily.     . digoxin (LANOXIN) 0.125 MG tablet Take 0.125 mg by mouth daily.    Marland Kitchen ezetimibe (ZETIA) 10 MG tablet Take 10 mg by mouth daily.      . fenofibrate 160 MG tablet Take 160 mg by mouth daily.    . furosemide (LASIX) 20 MG tablet Take 1/2 tablet  by mouth daily    . glipiZIDE (GLUCOTROL XL) 10 MG 24 hr tablet TAKE 1 TABLET BY MOUTH TWICE DAILY    . linagliptin (TRADJENTA) 5 MG TABS tablet Take 5 mg by mouth.    Marland Kitchen lisinopril (PRINIVIL,ZESTRIL) 40 MG tablet Take 40 mg by mouth 2 (two) times daily.    . metFORMIN (GLUCOPHAGE-XR) 500 MG 24 hr tablet 500 mg. Take two 500mg  tablets by mouth twice daily    . Multiple Vitamin (MULTIVITAMIN) tablet Take 1 tablet by mouth daily.    . Omega-3 Fatty Acids (FISH OIL) 1200 MG CAPS Take by mouth 3 (three) times daily.    Marland Kitchen warfarin (COUMADIN) 7.5 MG tablet Take 7.5 mg by mouth daily.      . digoxin (LANOXIN) 0.125 MG tablet TAKE 1 TABLET DAILY (Patient not taking: Reported on 02/27/2015) 90 tablet 3  . furosemide (LASIX) 20 MG tablet TAKE ONE-HALF (1/2) TABLET DAILY (Patient not taking: Reported on 02/27/2015) 45 tablet 3  . lisinopril (PRINIVIL,ZESTRIL) 40 MG tablet TAKE 1 TABLET TWICE A DAY (Patient not taking:  Reported on 02/27/2015) 180 tablet 3   No current facility-administered medications for this visit.    REVIEW OF SYSTEMS:  [X]  denotes positive finding, [ ]  denotes negative finding Cardiac  Comments:  Chest pain or chest pressure:    Shortness of breath upon exertion:    Short of breath when lying flat:    Irregular heart rhythm: x       Vascular    Pain in calf, thigh, or hip brought on by ambulation:    Pain in feet at night that wakes you up from your sleep:     Blood clot in your veins:    Leg swelling:         Pulmonary    Oxygen at home:    Productive cough:     Wheezing:         Neurologic    Sudden weakness in arms or legs:     Sudden numbness in arms or legs:     Sudden onset of difficulty speaking or slurred speech:    Temporary loss of vision in one eye:     Problems with dizziness:         Gastrointestinal    Blood in stool:     Vomited blood:         Genitourinary    Burning when urinating:     Blood in urine:        Psychiatric    Major depression:         Hematologic    Bleeding problems:    Problems with blood clotting too easily:        Skin    Rashes or ulcers:        Constitutional    Fever or chills:      PHYSICAL EXAM: Filed Vitals:   02/27/15 1423 02/27/15 1426 02/27/15 1429  BP: 149/80 151/65 143/66  Pulse: 70    Temp: 98 F (36.7 C)    TempSrc: Oral    Resp: 20    Height: 5\' 6"  (1.676 m)    Weight: 171 lb 9.6 oz (77.837 kg)      GENERAL: The patient is a well-nourished male, in no acute distress. The vital signs are documented above. CARDIAC:  Irregular rhythm VASCULAR:  2+ radial and 2+ dorsalis pedis pulses bilaterally, carotid arteries without bruits bilaterally. Well-healed left neck incision PULMONARY: There is good air exchange  bilaterally without wheezing or rales. MUSCULOSKELETAL: There are no major deformities or cyanosis. NEUROLOGIC: No focal weakness or paresthesias are detected. SKIN: There are no ulcers or  rashes noted. PSYCHIATRIC: The patient has a normal affect.  DATA:   carotid duplex today was reviewed with the patient. This reveals widely patent endarterectomy on the left. On the right he has moderate to severe stenosis in the 60-79% range. No significant change since his last duplex. If his diastolic velocities are 78 cm/s  MEDICAL ISSUES:  asymptomatic moderate to severe right carotid stenosis. I again discussed symptoms of carotid disease with the patient. I explained he is below the threshold where we would recommend endarterectomy based on asymptomatic disease. We will continue with serial 6 month ultrasound scans. If he progresses to greater than 80% or has symptoms we would recommend right endarterectomy. He is comfortable with this plan and will see [place] again in 6 months  No Follow-up on file.   Curt Jews [facility]: [phone]

## 2015-03-11 ENCOUNTER — Other Ambulatory Visit: Payer: Self-pay | Admitting: Cardiology

## 2015-03-12 NOTE — Telephone Encounter (Signed)
Rx request sent to pharmacy.  

## 2015-04-05 ENCOUNTER — Other Ambulatory Visit: Payer: Self-pay | Admitting: Cardiology

## 2015-04-05 NOTE — Telephone Encounter (Signed)
Rx(s) sent to pharmacy electronically.  

## 2015-09-06 ENCOUNTER — Encounter: Payer: Self-pay | Admitting: Family

## 2015-09-10 ENCOUNTER — Other Ambulatory Visit: Payer: Self-pay | Admitting: *Deleted

## 2015-09-10 DIAGNOSIS — I6523 Occlusion and stenosis of bilateral carotid arteries: Secondary | ICD-10-CM

## 2015-09-10 DIAGNOSIS — Z48812 Encounter for surgical aftercare following surgery on the circulatory system: Secondary | ICD-10-CM

## 2015-09-11 ENCOUNTER — Ambulatory Visit (HOSPITAL_COMMUNITY)
Admission: RE | Admit: 2015-09-11 | Discharge: 2015-09-11 | Disposition: A | Discharge status: Home / Self Care | Payer: Medicare Other | Source: Ambulatory Visit | Attending: Family | Admitting: Family

## 2015-09-11 ENCOUNTER — Encounter: Payer: Self-pay | Admitting: Family

## 2015-09-11 ENCOUNTER — Ambulatory Visit (INDEPENDENT_AMBULATORY_CARE_PROVIDER_SITE_OTHER): Payer: Medicare Other | Admitting: Family

## 2015-09-11 VITALS — BP 126/76 | HR 55 | Temp 97.3°F | Resp 18 | Ht 65.5 in | Wt 173.5 lb

## 2015-09-11 DIAGNOSIS — Z87891 Personal history of nicotine dependence: Secondary | ICD-10-CM

## 2015-09-11 DIAGNOSIS — I509 Heart failure, unspecified: Secondary | ICD-10-CM | POA: Insufficient documentation

## 2015-09-11 DIAGNOSIS — Z48812 Encounter for surgical aftercare following surgery on the circulatory system: Secondary | ICD-10-CM | POA: Insufficient documentation

## 2015-09-11 DIAGNOSIS — I6523 Occlusion and stenosis of bilateral carotid arteries: Secondary | ICD-10-CM | POA: Insufficient documentation

## 2015-09-11 DIAGNOSIS — J449 Chronic obstructive pulmonary disease, unspecified: Secondary | ICD-10-CM | POA: Insufficient documentation

## 2015-09-11 DIAGNOSIS — I2510 Atherosclerotic heart disease of native coronary artery without angina pectoris: Secondary | ICD-10-CM | POA: Diagnosis not present

## 2015-09-11 DIAGNOSIS — E119 Type 2 diabetes mellitus without complications: Secondary | ICD-10-CM | POA: Diagnosis not present

## 2015-09-11 DIAGNOSIS — Z9889 Other specified postprocedural states: Secondary | ICD-10-CM | POA: Diagnosis not present

## 2015-09-11 DIAGNOSIS — E785 Hyperlipidemia, unspecified: Secondary | ICD-10-CM | POA: Insufficient documentation

## 2015-09-11 NOTE — Progress Notes (Signed)
Chief Complaint: Follow up Extracranial Carotid Artery Stenosis   History of Present Illness  Oscar [name] is a 75 y.o. male who is status post left carotid endarterectomy by Dr. Donnetta Hutching in 2007. He had severe asymptomatic stenosis at that time.  He returns today for carotid artery surveillance and follow up.  Dr. Donnetta Hutching documents in June, 2014 that recent (outside facility) duplex showed 60-79% right carotid stenosis and no significant restenosis of the left.  States he is physically active in the house and yard and occasionally caring for his great grandchildren.  He has a history of CHF.  Patient has no history of TIA or stroke; specifically he denies amaurosis fugax or monocular blindness, unilateral facial drooping, hemiparesis, and receptive or expressive aphasia.  He denies claudication symptoms, denies non-healing wounds.  Patient denies New Medical or Surgical History.  He mows about 1-1.5 acres with a push mower.  Pt Diabetic: Yes, pt reports A1C of 6.8, in control Pt smoker: former smoker, quit in 2002   Pt meds include:  Statin : No: statins cause myalgias and arthralgias  Betablocker: Yes  ASA: No  Other anticoagulants/antiplatelets: coumadin for atrial fib.   Past Medical History  Diagnosis Date  . Cardiomyopathy     EF 35% in the past--Improved to 55% echo 2/10. Moderately severe mitral regurgitation in the past, now improved,  . Atrial fibrillation ([place])     permanent  . Coronary artery disease     non obstructive  . Diabetes mellitus   . Dyslipidemia   . COPD (chronic obstructive pulmonary disease) ([place])   . History of tobacco abuse   . Nephrolithiasis   . Carotid artery stenosis   . Anemia   . CHF (congestive heart failure) ([place]) 2002  . Tremors of nervous system     Social History Social History  Substance Use Topics  . Smoking status: Former Smoker    Types: Cigarettes    Quit date: 02/25/2000  . Smokeless tobacco: Former Systems developer  .  Alcohol Use: No    Family History Family History  Problem Relation Age of Onset  . Coronary artery disease Mother   . Diabetes Mother   . Heart disease Mother     Before age 60 and  CHF  . Hypertension Mother   . Other Mother     varicose veins  . Heart attack Mother   . Coronary artery disease Other   . Heart disease Sister   . Hypertension Sister   . Heart attack Sister   . Heart disease Brother   . Hyperlipidemia Brother   . Hypertension Brother   . Other Brother     varicose veins  . Heart attack Brother   . Diabetes Daughter   . Hypertension Daughter   . Other Daughter     varicose veins  . Heart disease Son 76    Heart Disease before age 85- Open hear surgery  . Deep vein thrombosis Son   . Heart attack Son     Surgical History Past Surgical History  Procedure Laterality Date  . Tonsillectomy    . Left carotid endarterectomy    . Carotid endarterectomy Left 10-21-05    cea    Allergies  Allergen Reactions  . Penicillins Hives    Blisters on hands  . Statins     Joint and muscle pain and low energy.    Current Outpatient Prescriptions  Medication Sig Dispense Refill  . albuterol (PROAIR HFA) 108 (90 BASE)  MCG/ACT inhaler Inhale 2 puffs into the lungs.    Marland Kitchen amLODipine (NORVASC) 2.5 MG tablet TAKE 1 TABLET DAILY 90 tablet 3  . carvedilol (COREG) 25 MG tablet TAKE 1 TABLET TWICE A DAY WITH MEALS (DUE FOR OFFICE VISIT WITH DOCTOR HOCHREIN) 180 tablet 4  . colesevelam (WELCHOL) 625 MG tablet Take 1,875 mg by mouth 3 (three) times daily.     . digoxin (LANOXIN) 0.125 MG tablet Take 0.125 mg by mouth daily.    Marland Kitchen ezetimibe (ZETIA) 10 MG tablet Take 10 mg by mouth daily.      . fenofibrate 160 MG tablet Take 160 mg by mouth daily.    . furosemide (LASIX) 20 MG tablet Take 1/2 tablet by mouth daily    . glipiZIDE (GLUCOTROL XL) 10 MG 24 hr tablet TAKE 1 TABLET BY MOUTH TWICE DAILY    . lisinopril (PRINIVIL,ZESTRIL) 40 MG tablet Take 40 mg by mouth 2 (two) times  daily.    . metFORMIN (GLUCOPHAGE-XR) 500 MG 24 hr tablet 500 mg. Take two 500mg  tablets by mouth twice daily    . Multiple Vitamin (MULTIVITAMIN) tablet Take 1 tablet by mouth daily.    . Omega-3 Fatty Acids (FISH OIL) 1200 MG CAPS Take by mouth 3 (three) times daily.    Marland Kitchen warfarin (COUMADIN) 7.5 MG tablet Take 7.5 mg by mouth daily.      . digoxin (LANOXIN) 0.125 MG tablet TAKE 1 TABLET DAILY (Patient not taking: Reported on 02/27/2015) 90 tablet 3  . furosemide (LASIX) 20 MG tablet TAKE ONE-HALF (1/2) TABLET DAILY (Patient not taking: Reported on 02/27/2015) 45 tablet 3  . linagliptin (TRADJENTA) 5 MG TABS tablet Take 5 mg by mouth.    Marland Kitchen lisinopril (PRINIVIL,ZESTRIL) 40 MG tablet TAKE 1 TABLET TWICE A DAY (Patient not taking: Reported on 02/27/2015) 180 tablet 3   No current facility-administered medications for this visit.    Review of Systems : See HPI for pertinent positives and negatives.  Physical Examination  Filed Vitals:   09/11/15 0906  BP: 126/76  Pulse: 55  Temp: 97.3 F (36.3 C)  TempSrc: Axillary  Resp: 18  Height: 5' 5.5" (1.664 m)  Weight: 173 lb 8 oz (78.699 kg)  SpO2: 98%   Body mass index is 28.42 kg/(m^2).  General: WDWN obese male in NAD  GAIT: normal  Eyes: PERRLA  Pulmonary: Respirations are non labored, CTAB Cardiac: Irregular rhythm.   VASCULAR EXAM  Carotid Bruits  Left  Right    Negative  Negative    Radial pulses are 3+ palpable and equal. Aorta is not palpable  LE Pulses  LEFT  RIGHT   POPLITEAL  not palpable  not palpable    Gastrointestinal: soft, nontender, BS WNL, no r/g, no palpable masses.  Musculoskeletal: No muscle atrophy/wasting. M/S 5/5 throughout, Extremities without ischemic changes.  Neurologic: A&O X 3; Appropriate Affect, Speech is normal  CN 2-12 intact, Pain and light touch intact in extremities, Motor exam as listed above.               Non-Invasive Vascular  Imaging CAROTID DUPLEX 09/11/2015   Right ICA: 40 - 59 % (high end) stenosis. Left ICA: 1 - 39 % stenosis of CEA site. The right ICA appears less stenotic and the right ICA is stable compared to 02/20/15 duplex.   Assessment: Oscar [name] is a 75 y.o. male who is status post left carotid endarterectomy in 2007. He has no history of stroke or TIA.  Today's carotid Duplex suggests 1-38% stenosis of the left carotid endarterectomy site with no left internal carotid artery stenosis and 40-59% (high end of range) stenosis of the right proximal internal carotid artery. The right ICA appears less stenotic and the right ICA is stable compared to 02/20/15 duplex.   His atherosclerotic risk factors include currently in control DM, former smoker, non obstructive CAD, and statin intolerance.  He takes warfarin for atrial fib. He stays physically active.   Plan: Follow-up in 6 months with Carotid Duplex scan.   I discussed in depth with the patient the nature of atherosclerosis, and emphasized the importance of maximal medical management including strict control of blood pressure, blood glucose, and lipid levels, obtaining regular exercise, and continued cessation of smoking.  The patient is aware that without maximal medical management the underlying atherosclerotic disease process will progress, limiting the benefit of any interventions. The patient was given information about stroke prevention and what symptoms should prompt the patient to seek immediate medical care. Thank you for allowing [place] to participate in this patient's care.  Clemon Chambers, RN, MSN, FNP-C [facility] Office: [phone]  Clinic Physician: Early  09/11/2015 9:19 AM

## 2015-09-11 NOTE — Patient Instructions (Signed)
Stroke Prevention Some medical conditions and behaviors are associated with an increased chance of having a stroke. You may prevent a stroke by making healthy choices and managing medical conditions. HOW CAN I REDUCE MY RISK OF HAVING A STROKE?   Stay physically active. Get at least 30 minutes of activity on most or all days.  Do not smoke. It may also be helpful to avoid exposure to secondhand smoke.  Limit alcohol use. Moderate alcohol use is considered to be:  No more than 2 drinks per day for men.  No more than 1 drink per day for nonpregnant women.  Eat healthy foods. This involves:  Eating 5 or more servings of fruits and vegetables a day.  Making dietary changes that address high blood pressure (hypertension), high cholesterol, diabetes, or obesity.  Manage your cholesterol levels.  Making food choices that are high in fiber and low in saturated fat, trans fat, and cholesterol may control cholesterol levels.  Take any prescribed medicines to control cholesterol as directed by your health care provider.  Manage your diabetes.  Controlling your carbohydrate and sugar intake is recommended to manage diabetes.  Take any prescribed medicines to control diabetes as directed by your health care provider.  Control your hypertension.  Making food choices that are low in salt (sodium), saturated fat, trans fat, and cholesterol is recommended to manage hypertension.  Ask your health care provider if you need treatment to lower your blood pressure. Take any prescribed medicines to control hypertension as directed by your health care provider.  If you are 18-39 years of age, have your blood pressure checked every 3-5 years. If you are 40 years of age or older, have your blood pressure checked every year.  Maintain a healthy weight.  Reducing calorie intake and making food choices that are low in sodium, saturated fat, trans fat, and cholesterol are recommended to manage  weight.  Stop drug abuse.  Avoid taking birth control pills.  Talk to your health care provider about the risks of taking birth control pills if you are over 35 years old, smoke, get migraines, or have ever had a blood clot.  Get evaluated for sleep disorders (sleep apnea).  Talk to your health care provider about getting a sleep evaluation if you snore a lot or have excessive sleepiness.  Take medicines only as directed by your health care provider.  For some people, aspirin or blood thinners (anticoagulants) are helpful in reducing the risk of forming abnormal blood clots that can lead to stroke. If you have the irregular heart rhythm of atrial fibrillation, you should be on a blood thinner unless there is a good reason you cannot take them.  Understand all your medicine instructions.  Make sure that other conditions (such as anemia or atherosclerosis) are addressed. SEEK IMMEDIATE MEDICAL CARE IF:   You have sudden weakness or numbness of the face, arm, or leg, especially on one side of the body.  Your face or eyelid droops to one side.  You have sudden confusion.  You have trouble speaking (aphasia) or understanding.  You have sudden trouble seeing in one or both eyes.  You have sudden trouble walking.  You have dizziness.  You have a loss of balance or coordination.  You have a sudden, severe headache with no known cause.  You have new chest pain or an irregular heartbeat. Any of these symptoms may represent a serious problem that is an emergency. Do not wait to see if the symptoms will   go away. Get medical help at once. Call your local emergency services (911 in [place] Do not drive yourself to the hospital.   This information is not intended to replace advice given to you by your health care provider. Make sure you discuss any questions you have with your health care provider.   Document Released: 03/20/2004 Document Revised: 03/03/2014 Document Reviewed:  08/13/2012 Elsevier Interactive Patient Education 2016 Elsevier Inc.  

## 2015-11-14 ENCOUNTER — Other Ambulatory Visit: Payer: Self-pay | Admitting: Cardiology

## 2015-11-19 ENCOUNTER — Other Ambulatory Visit: Payer: Self-pay | Admitting: Cardiology

## 2016-01-10 ENCOUNTER — Other Ambulatory Visit: Payer: Self-pay | Admitting: *Deleted

## 2016-01-10 DIAGNOSIS — I6523 Occlusion and stenosis of bilateral carotid arteries: Secondary | ICD-10-CM

## 2016-03-13 ENCOUNTER — Encounter: Payer: Self-pay | Admitting: Family

## 2016-03-20 ENCOUNTER — Encounter: Payer: Self-pay | Admitting: Family

## 2016-03-20 ENCOUNTER — Ambulatory Visit (HOSPITAL_COMMUNITY)
Admission: RE | Admit: 2016-03-20 | Discharge: 2016-03-20 | Disposition: A | Discharge status: Home / Self Care | Payer: Medicare Other | Source: Ambulatory Visit | Attending: Family | Admitting: Family

## 2016-03-20 ENCOUNTER — Ambulatory Visit (INDEPENDENT_AMBULATORY_CARE_PROVIDER_SITE_OTHER): Payer: Medicare Other | Admitting: Family

## 2016-03-20 VITALS — BP 132/80 | HR 75 | Temp 97.3°F | Resp 20 | Ht 65.5 in | Wt 177.0 lb

## 2016-03-20 DIAGNOSIS — Z9889 Other specified postprocedural states: Secondary | ICD-10-CM | POA: Diagnosis not present

## 2016-03-20 DIAGNOSIS — Z87891 Personal history of nicotine dependence: Secondary | ICD-10-CM

## 2016-03-20 DIAGNOSIS — I6523 Occlusion and stenosis of bilateral carotid arteries: Secondary | ICD-10-CM | POA: Diagnosis present

## 2016-03-20 LAB — VAS US CAROTID
LCCAPDIAS: 14 cm/s
LCCAPSYS: 66 cm/s
LEFT ECA DIAS: -20 cm/s
LEFT VERTEBRAL DIAS: 10 cm/s
Left CCA dist dias: 21 cm/s
Left CCA dist sys: 149 cm/s
Left ICA dist dias: -27 cm/s
Left ICA dist sys: -77 cm/s
Left ICA prox dias: -18 cm/s
Left ICA prox sys: -64 cm/s
RCCAPDIAS: 3 cm/s
RCCAPSYS: 49 cm/s
RIGHT CCA MID DIAS: 11 cm/s
RIGHT ECA DIAS: -5 cm/s
RIGHT VERTEBRAL DIAS: -10 cm/s
Right cca dist sys: -70 cm/s

## 2016-03-20 NOTE — Patient Instructions (Signed)
Stroke Prevention Some medical conditions and behaviors are associated with an increased chance of having a stroke. You may prevent a stroke by making healthy choices and managing medical conditions. How can I reduce my risk of having a stroke?  Stay physically active. Get at least 30 minutes of activity on most or all days.  Do not smoke. It may also be helpful to avoid exposure to secondhand smoke.  Limit alcohol use. Moderate alcohol use is considered to be:  No more than 2 drinks per day for men.  No more than 1 drink per day for nonpregnant women.  Eat healthy foods. This involves:  Eating 5 or more servings of fruits and vegetables a day.  Making dietary changes that address high blood pressure (hypertension), high cholesterol, diabetes, or obesity.  Manage your cholesterol levels.  Making food choices that are high in fiber and low in saturated fat, trans fat, and cholesterol may control cholesterol levels.  Take any prescribed medicines to control cholesterol as directed by your health care provider.  Manage your diabetes.  Controlling your carbohydrate and sugar intake is recommended to manage diabetes.  Take any prescribed medicines to control diabetes as directed by your health care provider.  Control your hypertension.  Making food choices that are low in salt (sodium), saturated fat, trans fat, and cholesterol is recommended to manage hypertension.  Ask your health care provider if you need treatment to lower your blood pressure. Take any prescribed medicines to control hypertension as directed by your health care provider.  If you are 18-39 years of age, have your blood pressure checked every 3-5 years. If you are 40 years of age or older, have your blood pressure checked every year.  Maintain a healthy weight.  Reducing calorie intake and making food choices that are low in sodium, saturated fat, trans fat, and cholesterol are recommended to manage  weight.  Stop drug abuse.  Avoid taking birth control pills.  Talk to your health care provider about the risks of taking birth control pills if you are over 35 years old, smoke, get migraines, or have ever had a blood clot.  Get evaluated for sleep disorders (sleep apnea).  Talk to your health care provider about getting a sleep evaluation if you snore a lot or have excessive sleepiness.  Take medicines only as directed by your health care provider.  For some people, aspirin or blood thinners (anticoagulants) are helpful in reducing the risk of forming abnormal blood clots that can lead to stroke. If you have the irregular heart rhythm of atrial fibrillation, you should be on a blood thinner unless there is a good reason you cannot take them.  Understand all your medicine instructions.  Make sure that other conditions (such as anemia or atherosclerosis) are addressed. Get help right away if:  You have sudden weakness or numbness of the face, arm, or leg, especially on one side of the body.  Your face or eyelid droops to one side.  You have sudden confusion.  You have trouble speaking (aphasia) or understanding.  You have sudden trouble seeing in one or both eyes.  You have sudden trouble walking.  You have dizziness.  You have a loss of balance or coordination.  You have a sudden, severe headache with no known cause.  You have new chest pain or an irregular heartbeat. Any of these symptoms may represent a serious problem that is an emergency. Do not wait to see if the symptoms will go away.   Get medical help at once. Call your local emergency services (911 in [place] Do not drive yourself to the hospital. This information is not intended to replace advice given to you by your health care provider. Make sure you discuss any questions you have with your health care provider. Document Released: 03/20/2004 Document Revised: 07/19/2015 Document Reviewed: 08/13/2012 Elsevier  Interactive Patient Education  2017 Elsevier Inc.  

## 2016-03-20 NOTE — Progress Notes (Signed)
Chief Complaint: Follow up Extracranial Carotid Artery Stenosis   History of Present Illness  Oscar [name] is a 76 y.o. male who is status post left carotid endarterectomy by Dr. Donnetta Hutching in 2007. He had severe asymptomatic stenosis at that time.  He returns today for carotid artery surveillance and follow up.   Dr. Donnetta Hutching documents in June, 2014 that recent (outside facility) duplex showed 60-79% right carotid stenosis and no significant restenosis of the left.  States he is physically active in the house and yard and occasionally caring for his great grandchildren.  He has a history of CHF.  Patient has no history of TIA or stroke; specifically he denies amaurosis fugax or monocular blindness, unilateral facial drooping, hemiparesis, and receptive or expressive aphasia.  He denies claudication symptoms with walking, denies non-healing wounds.   He mows about 1-1.5 acres with a push mower.  Pt Diabetic: Yes, pt reports A1C of 6.8, in control Pt smoker: former smoker, quit in 2002 when he had an MI  Pt meds include:  Statin : No: statins cause myalgias and arthralgias  Betablocker: Yes  ASA: No  Other anticoagulants/antiplatelets: coumadin for atrial fib.    Past Medical History:  Diagnosis Date  . Anemia   . Atrial fibrillation ([place])    permanent  . Cardiomyopathy    EF 35% in the past--Improved to 55% echo 2/10. Moderately severe mitral regurgitation in the past, now improved,  . Carotid artery stenosis   . CHF (congestive heart failure) ([place]) 2002  . COPD (chronic obstructive pulmonary disease) ([place])   . Coronary artery disease    non obstructive  . Diabetes mellitus   . Dyslipidemia   . History of tobacco abuse   . Nephrolithiasis   . Tremors of nervous system     Social History Social History  Substance Use Topics  . Smoking status: Former Smoker    Types: Cigarettes    Quit date: 02/25/2000  . Smokeless tobacco: Former Systems developer  . Alcohol use No     Family History Family History  Problem Relation Age of Onset  . Coronary artery disease Mother   . Diabetes Mother   . Heart disease Mother     Before age 77 and  CHF  . Hypertension Mother   . Other Mother     varicose veins  . Heart attack Mother   . Coronary artery disease Other   . Heart disease Sister   . Hypertension Sister   . Heart attack Sister   . Heart disease Brother   . Hyperlipidemia Brother   . Hypertension Brother   . Other Brother     varicose veins  . Heart attack Brother   . Diabetes Daughter   . Hypertension Daughter   . Other Daughter     varicose veins  . Heart disease Son 38    Heart Disease before age 54- Open hear surgery  . Deep vein thrombosis Son   . Heart attack Son     Surgical History Past Surgical History:  Procedure Laterality Date  . CAROTID ENDARTERECTOMY Left 10-21-05   cea  . left carotid endarterectomy    . TONSILLECTOMY      Allergies  Allergen Reactions  . Penicillins Hives    Blisters on hands  . Statins     Joint and muscle pain and low energy.    Current Outpatient Prescriptions  Medication Sig Dispense Refill  . albuterol (PROAIR HFA) 108 (90 BASE) MCG/ACT inhaler Inhale  2 puffs into the lungs.    Marland Kitchen amLODipine (NORVASC) 2.5 MG tablet TAKE 1 TABLET DAILY 90 tablet 3  . carvedilol (COREG) 25 MG tablet TAKE 1 TABLET TWICE A DAY WITH MEALS (DUE FOR OFFICE VISIT WITH DOCTOR HOCHREIN) 180 tablet 4  . colesevelam (WELCHOL) 625 MG tablet Take 1,875 mg by mouth 3 (three) times daily.     . digoxin (LANOXIN) 0.125 MG tablet Take 0.125 mg by mouth daily.    Marland Kitchen ezetimibe (ZETIA) 10 MG tablet Take 10 mg by mouth daily.      . fenofibrate 160 MG tablet Take 160 mg by mouth daily.    . furosemide (LASIX) 20 MG tablet Take 1/2 tablet by mouth daily    . glipiZIDE (GLUCOTROL XL) 10 MG 24 hr tablet TAKE 1 TABLET BY MOUTH TWICE DAILY    . lisinopril (PRINIVIL,ZESTRIL) 40 MG tablet Take 40 mg by mouth 2 (two) times daily.    .  metFORMIN (GLUCOPHAGE-XR) 500 MG 24 hr tablet 500 mg. Take two 500mg  tablets by mouth twice daily    . Multiple Vitamin (MULTIVITAMIN) tablet Take 1 tablet by mouth daily.    . Omega-3 Fatty Acids (FISH OIL) 1200 MG CAPS Take by mouth 3 (three) times daily.    Marland Kitchen warfarin (COUMADIN) 7.5 MG tablet Take 7.5 mg by mouth daily.      Marland Kitchen linagliptin (TRADJENTA) 5 MG TABS tablet Take 5 mg by mouth.     No current facility-administered medications for this visit.     Review of Systems : See HPI for pertinent positives and negatives.  Physical Examination  Vitals:   03/20/16 1434 03/20/16 1436  BP: (!) 144/79 132/80  Pulse: 75   Resp: 20   Temp: 97.3 F (36.3 C)   TempSrc: Oral   SpO2: 96%   Weight: 177 lb (80.3 kg)   Height: 5' 5.5" (1.664 m)    Body mass index is 29.01 kg/m.  General: WDWN male in NAD  GAIT: normal  Eyes: PERRLA  Pulmonary: Respirations are non labored, CTAB Cardiac: Irregular rhythm.   VASCULAR EXAM  Carotid Bruits  Left  Right    positive Negative    Radial pulses are 3+ palpable and equal. Aorta is not palpable  LE Pulses  LEFT  RIGHT   POPLITEAL  not palpable  not palpable    Gastrointestinal: soft, nontender, BS WNL, no r/g, no palpable masses.  Musculoskeletal: No muscle atrophy/wasting. M/S 5/5 throughout, Extremities without ischemic changes.  Neurologic: A&O X 3; Appropriate Affect, Speech is normal  CN 2-12 intact, Pain and light touch intact in extremities, Motor exam as listed above    Assessment: Oscar [name] is a 76 y.o. male who is status post left carotid endarterectomy in 2007. He has no history of stroke or TIA.   His atherosclerotic risk factors include currently in control DM, former smoker, non obstructive CAD, and statin intolerance.  He takes warfarin for atrial fib. He stays physically active.  DATA Today's carotid Duplex suggests <40% stenosis of the left carotid  endarterectomy site and 40-59% (high end of range) stenosis of the right proximal internal carotid artery.  No significant stenosis of the bilateral CCA or right ECA. Left ECA with stenosis.  Bilateral vertebral artery flow is antegrade.  Bilateral subclavian artery waveforms are normal.  No significant change compared to the last exams on 09-11-15. Prior exams to this suggested 60-79% right ICA stenosis.    Plan: Follow-up in 9 months with  Carotid Duplex scan.    I discussed in depth with the patient the nature of atherosclerosis, and emphasized the importance of maximal medical management including strict control of blood pressure, blood glucose, and lipid levels, obtaining regular exercise, and continued cessation of smoking.  The patient is aware that without maximal medical management the underlying atherosclerotic disease process will progress, limiting the benefit of any interventions. The patient was given information about stroke prevention and what symptoms should prompt the patient to seek immediate medical care. Thank you for allowing [place] to participate in this patient's care.  Clemon Chambers, RN, MSN, FNP-C [facility] Office: [phone]  Clinic Physician: Oneida Alar  03/20/16 2:42 PM

## 2016-03-24 NOTE — Addendum Note (Signed)
Addended by: Lianne Cure A on: 03/24/2016 12:22 PM   Modules accepted: Orders

## 2016-05-07 ENCOUNTER — Telehealth: Payer: Self-pay | Admitting: Cardiology

## 2016-05-07 MED ORDER — AMLODIPINE BESYLATE 2.5 MG PO TABS: 2.5000 mg | ORAL_TABLET | Freq: Every day | ORAL | 0 refills | Status: DC

## 2016-05-07 NOTE — Telephone Encounter (Signed)
New message    *STAT* If patient is at the pharmacy, call can be transferred to refill team.   1. Which medications need to be refilled? (please list name of each medication and dose if known) amLODipine (NORVASC) 2.5 MG tablet  2. Which pharmacy/location (including street and city if local pharmacy) is medication to be sent to[facility]  3. Do they need a 30 day or 90 day supply? 90 day

## 2016-05-07 NOTE — Telephone Encounter (Signed)
Rx(s) sent to pharmacy electronically.  

## 2016-05-21 ENCOUNTER — Other Ambulatory Visit: Payer: Self-pay | Admitting: Cardiology

## 2016-05-21 NOTE — Telephone Encounter (Signed)
Rx(s) sent to pharmacy electronically.  

## 2016-06-06 ENCOUNTER — Encounter: Payer: Self-pay | Admitting: Cardiology

## 2016-06-24 NOTE — Progress Notes (Signed)
HPI The patient returns for followup of atrial fibrillation and previous cardiomyopathy Since I last saw him he has done very well.  He is using his mower and doing other work around the yard.  The patient denies any new symptoms such as chest discomfort, neck or arm discomfort. There has been no new shortness of breath, PND or orthopnea. There have been no reported palpitations, presyncope or syncope.  Allergies  Allergen Reactions  . Penicillins Hives    Blisters on hands  . Statins     Joint and muscle pain and low energy.    Current Outpatient Prescriptions  Medication Sig Dispense Refill  . albuterol (PROAIR HFA) 108 (90 BASE) MCG/ACT inhaler Inhale 2 puffs into the lungs.    Marland Kitchen amLODipine (NORVASC) 2.5 MG tablet Take 1 tablet (2.5 mg total) by mouth daily. MUST KEEP APPOINTMENT 06/25/16 WITH DR [facility] FOR FUTURE REFILLS 90 tablet 0  . carvedilol (COREG) 25 MG tablet Take 25 mg by mouth 2 (two) times daily with a meal.     . colesevelam (WELCHOL) 625 MG tablet Take 1,250 mg by mouth 3 (three) times daily.     . digoxin (LANOXIN) 0.125 MG tablet Take 0.125 mg by mouth daily.    Marland Kitchen ezetimibe (ZETIA) 10 MG tablet Take 10 mg by mouth daily.      . fenofibrate 160 MG tablet Take 160 mg by mouth daily.    . furosemide (LASIX) 20 MG tablet Take 0.5 tablets (10 mg total) by mouth daily. 45 tablet 0  . glipiZIDE (GLUCOTROL XL) 10 MG 24 hr tablet TAKE 1 TABLET BY MOUTH TWICE DAILY    . linagliptin (TRADJENTA) 5 MG TABS tablet Take 5 mg by mouth daily.     Marland Kitchen lisinopril (PRINIVIL,ZESTRIL) 40 MG tablet Take 40 mg by mouth 2 (two) times daily.    . metFORMIN (GLUCOPHAGE-XR) 500 MG 24 hr tablet Take 500 mg by mouth 2 (two) times daily.     . Multiple Vitamin (MULTIVITAMIN) tablet Take 1 tablet by mouth daily.    . Omega-3 Fatty Acids (FISH OIL) 1200 MG CAPS Take by mouth 3 (three) times daily.    Marland Kitchen warfarin (COUMADIN) 7.5 MG tablet Take 7.5 mg by mouth daily.       No current  facility-administered medications for this visit.     Past Medical History:  Diagnosis Date  . Anemia   . Atrial fibrillation ([place])    permanent  . Cardiomyopathy    EF 35% in the past--Improved to 55% echo 2/10. Moderately severe mitral regurgitation in the past, now improved,  . Carotid artery stenosis   . CHF (congestive heart failure) ([place]) 2002  . COPD (chronic obstructive pulmonary disease) ([place])   . Coronary artery disease    non obstructive  . Diabetes mellitus   . Dyslipidemia   . History of tobacco abuse   . Nephrolithiasis   . Tremors of nervous system     Past Surgical History:  Procedure Laterality Date  . CAROTID ENDARTERECTOMY Left 10-21-05   cea  . left carotid endarterectomy    . TONSILLECTOMY      ROS:    As stated in the HPI and negative for all other systems.  PHYSICAL EXAM BP 132/70   Pulse 70   Ht 5' 5.5" (1.664 m)   Wt 182 lb (82.6 kg)   BMI 29.83 kg/m  GENERAL:  Well appearing NECK:  No jugular venous distention, waveform within normal limits, carotid upstroke  brisk and symmetric, no bruits, no thyromegaly LUNGS:  Clear to auscultation bilaterally CHEST:  Unremarkable HEART:  PMI not displaced or sustained,S1 and S2 within normal limits, no S3,  no clicks, no rubs, no murmurs, irregular ABD:  Flat, positive bowel sounds normal in frequency in pitch, no bruits, no rebound, no guarding, no midline pulsatile mass, no hepatomegaly, no splenomegaly EXT:  2 plus pulses throughout, no edema, no cyanosis no clubbing EKG:  Atrial fibrillation 65, axis within normal limits, intervals within normal limits, no acute ST-T wave changes. 06/25/2016    EKG:  Atrial fib, rate 70, rightward axis, intervals WNL, no acute ST T wave changes.  06/25/2016   ASSESSMENT AND PLAN  ATRIAL FIBRILLATION:  He has  no symptoms related to this. No change in therapy is indicated. He will continue with Coumadin and has no interest in NOACs.  Mr. RENO CLASBY has a CHA2DS2 -  VASc score of 5.    CARDIOMYOPATHY:  Hiis ejection fraction was improved on echo in 2016.  No change in therapy is suggested.   CAROTID STENOSIS:  This was to be followed by Dr. Donnetta Hutching. He had follow up in Jan and will be followed again in two years.  HTN:  The blood pressure is  at target. No change in medications is indicated. We will continue with therapeutic lifestyle changes (TLC).  LIPIDS:  LDL was 64 in Feb with HDL that was low and high triglycerides.  He needs better diet.  He is intolerant of statins.  DM:  A1C was 7.7.  I defer to Sherrie Mustache, MD

## 2016-06-25 ENCOUNTER — Ambulatory Visit (INDEPENDENT_AMBULATORY_CARE_PROVIDER_SITE_OTHER): Payer: Medicare Other | Admitting: Cardiology

## 2016-06-25 ENCOUNTER — Encounter: Payer: Self-pay | Admitting: Cardiology

## 2016-06-25 VITALS — BP 132/70 | HR 70 | Ht 65.5 in | Wt 182.0 lb

## 2016-06-25 DIAGNOSIS — I10 Essential (primary) hypertension: Secondary | ICD-10-CM | POA: Diagnosis not present

## 2016-06-25 DIAGNOSIS — E118 Type 2 diabetes mellitus with unspecified complications: Secondary | ICD-10-CM

## 2016-06-25 DIAGNOSIS — I480 Paroxysmal atrial fibrillation: Secondary | ICD-10-CM | POA: Diagnosis not present

## 2016-06-25 DIAGNOSIS — E785 Hyperlipidemia, unspecified: Secondary | ICD-10-CM | POA: Diagnosis not present

## 2016-06-25 DIAGNOSIS — I6523 Occlusion and stenosis of bilateral carotid arteries: Secondary | ICD-10-CM

## 2016-06-25 MED ORDER — AMLODIPINE BESYLATE 2.5 MG PO TABS: 2.5000 mg | ORAL_TABLET | Freq: Every day | ORAL | 3 refills | Status: DC

## 2016-06-25 NOTE — Patient Instructions (Signed)

## 2016-06-30 ENCOUNTER — Other Ambulatory Visit: Payer: Self-pay | Admitting: Cardiology

## 2017-01-06 ENCOUNTER — Ambulatory Visit: Payer: Medicare Other | Admitting: Family

## 2017-01-06 ENCOUNTER — Encounter (HOSPITAL_COMMUNITY): Payer: Medicare Other

## 2017-05-11 ENCOUNTER — Telehealth: Payer: Self-pay | Admitting: Cardiology

## 2017-05-11 MED ORDER — DIGOXIN 125 MCG PO TABS: 0.1250 mg | ORAL_TABLET | Freq: Every day | ORAL | 0 refills | Status: DC

## 2017-05-11 NOTE — Telephone Encounter (Signed)
New Message    *STAT* If patient is at the pharmacy, call can be transferred to refill team.   1. Which medications need to be refilled? (please list name of each medication and dose if known) digoxin (LANOXIN) 0.125 MG tablet    2. Which pharmacy/location (including street and city if local pharmacy) is medication to be sent to? [facility]   3. Do they need a 30 day or 90 day supply? 90 day supply

## 2017-07-02 ENCOUNTER — Encounter: Payer: Self-pay | Admitting: Cardiology

## 2017-07-09 ENCOUNTER — Other Ambulatory Visit: Payer: Self-pay | Admitting: Cardiology

## 2017-07-20 NOTE — Progress Notes (Signed)
HPI The patient returns for followup of atrial fibrillation and previous cardiomyopathy Since I last saw him he has had pneumonia.  He reports that he was hospitalized at [facility].  He reports that he had pneumonia.  He also describes acute on chronic heart failure but does not report any cardiac work-up.  He was sent home on oxygen.  His daughter aged 78 died since I last saw him.  He is not breathing quite back to baseline.  He only uses the oxygen as needed.  He wears at night.  He is not having any cough fevers or chills.  He said no PND or orthopnea.  He denies any palpitations, presyncope or syncope.   Allergies  Allergen Reactions  . Penicillins Hives    Blisters on hands  . Statins     Joint and muscle pain and low energy.    Current Outpatient Medications  Medication Sig Dispense Refill  . albuterol (PROAIR HFA) 108 (90 BASE) MCG/ACT inhaler Inhale 2 puffs into the lungs.    Marland Kitchen amLODipine (NORVASC) 2.5 MG tablet TAKE 1 TABLET DAILY 30 tablet 0  . carvedilol (COREG) 25 MG tablet TAKE 1 TABLET TWICE A DAY WITH MEALS (DUE FOR OFFICE VISIT WITH DOCTOR Channin Agustin) 180 tablet 2  . colesevelam (WELCHOL) 625 MG tablet Take 1,250 mg by mouth 3 (three) times daily.     . digoxin (LANOXIN) 0.125 MG tablet Take 1 tablet (0.125 mg total) by mouth daily. KEEP OV. 90 tablet 0  . ezetimibe (ZETIA) 10 MG tablet Take 10 mg by mouth daily.      . fenofibrate 160 MG tablet Take 160 mg by mouth daily.    . furosemide (LASIX) 20 MG tablet Take 0.5 tablets (10 mg total) by mouth daily. 45 tablet 0  . glipiZIDE (GLUCOTROL XL) 10 MG 24 hr tablet TAKE 1 TABLET BY MOUTH TWICE DAILY    . linagliptin (TRADJENTA) 5 MG TABS tablet Take 5 mg by mouth daily.     Marland Kitchen lisinopril (PRINIVIL,ZESTRIL) 40 MG tablet Take 40 mg by mouth daily.     . metFORMIN (GLUCOPHAGE-XR) 500 MG 24 hr tablet Take 500 mg by mouth 2 (two) times daily.     . Multiple Vitamin (MULTIVITAMIN) tablet Take 1 tablet by mouth daily.    .  Omega-3 Fatty Acids (FISH OIL) 1200 MG CAPS Take by mouth 3 (three) times daily.    Marland Kitchen warfarin (COUMADIN) 7.5 MG tablet Take 7.5 mg by mouth daily.       No current facility-administered medications for this visit.     Past Medical History:  Diagnosis Date  . Anemia   . Atrial fibrillation ([place])    permanent  . Cardiomyopathy    EF 35% in the past--Improved to 55% echo 2/10. Moderately severe mitral regurgitation in the past, now improved,  . Carotid artery stenosis   . CHF (congestive heart failure) ([place]) 2002  . COPD (chronic obstructive pulmonary disease) ([place])   . Coronary artery disease    non obstructive  . Diabetes mellitus   . Dyslipidemia   . History of tobacco abuse   . Nephrolithiasis   . Tremors of nervous system     Past Surgical History:  Procedure Laterality Date  . CAROTID ENDARTERECTOMY Left 10-21-05   cea  . left carotid endarterectomy    . TONSILLECTOMY      ROS:   As stated in the HPI and negative for all other systems.  PHYSICAL EXAM BP  130/68   Pulse 62   Ht 5\' 6"  (1.676 m)   Wt 177 lb (80.3 kg)   BMI 28.57 kg/m   GENERAL:  Well appearing NECK:  No jugular venous distention, waveform within normal limits, carotid upstroke brisk and symmetric, no bruits, no thyromegaly LUNGS:  Clear to auscultation bilaterally CHEST:  Unremarkable HEART:  PMI not displaced or sustained,S1 and S2 within normal limits, no S3, no clicks, no rubs, no murmurs, irregular ABD:  Flat, positive bowel sounds normal in frequency in pitch, no bruits, no rebound, no guarding, no midline pulsatile mass, no hepatomegaly, no splenomegaly EXT:  2 plus pulses throughout, no edema, no cyanosis no clubbing    EKG:  Atrial fibrillation 62, axis within normal limits, intervals within normal limits, no acute ST-T wave changes. 07/22/2017     ASSESSMENT AND PLAN  ATRIAL FIBRILLATION:  Oscar [name] has a CHA2DS2 - VASc score of 5.  He tolerates anticoagulation with rate  control.  No change in therapy.  CARDIOMYOPATHY:  Hiis ejection fraction was improved on echo in 2016.  However, given the recent report of heart failure when he was hospitalized I will follow-up with an echocardiogram.   CAROTID STENOSIS:  This was to be followed by Dr. Donnetta Hutching. He had follow up in July of last year with moderate left disease.    HTN:  The blood pressure is at target.  No change in therapy.   LIPIDS:  LDL was 62 earlier this month.  No change in therapy.   DM:  A1C was 8.4.  Follow up per Dione Housekeeper, MD

## 2017-07-22 ENCOUNTER — Encounter: Payer: Self-pay | Admitting: Cardiology

## 2017-07-22 ENCOUNTER — Ambulatory Visit (INDEPENDENT_AMBULATORY_CARE_PROVIDER_SITE_OTHER): Payer: Medicare Other | Admitting: Cardiology

## 2017-07-22 VITALS — BP 130/68 | HR 62 | Ht 66.0 in | Wt 177.0 lb

## 2017-07-22 DIAGNOSIS — I420 Dilated cardiomyopathy: Secondary | ICD-10-CM | POA: Diagnosis not present

## 2017-07-22 DIAGNOSIS — I482 Chronic atrial fibrillation: Secondary | ICD-10-CM | POA: Diagnosis not present

## 2017-07-22 DIAGNOSIS — E785 Hyperlipidemia, unspecified: Secondary | ICD-10-CM

## 2017-07-22 DIAGNOSIS — I10 Essential (primary) hypertension: Secondary | ICD-10-CM

## 2017-07-22 DIAGNOSIS — I4821 Permanent atrial fibrillation: Secondary | ICD-10-CM

## 2017-07-22 NOTE — Patient Instructions (Addendum)
Medication Instructions:  The current medical regimen is effective;  continue present plan and medications.  Testing/Procedures: Your physician has requested that you have an echocardiogram in [place]. Echocardiography is a painless test that uses sound waves to create images of your heart. It provides your doctor with information about the size and shape of your heart and how well your heart's chambers and valves are working. This procedure takes approximately one hour. There are no restrictions for this procedure.  Follow-Up: Follow up in 1 year with Dr. Percival Spanish .  You will receive a letter in the mail 2 months before you are due.  Please call us when you receive this letter to schedule your follow up appointment.  If you need a refill on your cardiac medications before your next appointment, please call your pharmacy.  Thank you for choosing [facility]!!

## 2017-07-29 ENCOUNTER — Other Ambulatory Visit: Payer: Self-pay | Admitting: Cardiology

## 2017-07-29 DIAGNOSIS — I4821 Permanent atrial fibrillation: Secondary | ICD-10-CM

## 2017-08-03 ENCOUNTER — Other Ambulatory Visit: Payer: Self-pay | Admitting: Cardiology

## 2017-08-13 ENCOUNTER — Other Ambulatory Visit: Payer: Medicare Other

## 2017-08-14 ENCOUNTER — Other Ambulatory Visit: Payer: Self-pay | Admitting: *Deleted

## 2017-08-14 ENCOUNTER — Telehealth: Payer: Self-pay | Admitting: Cardiology

## 2017-08-14 DIAGNOSIS — I4891 Unspecified atrial fibrillation: Secondary | ICD-10-CM

## 2017-08-14 MED ORDER — DIGOXIN 125 MCG PO TABS: 0.1250 mg | ORAL_TABLET | Freq: Every day | ORAL | 3 refills | Status: DC

## 2017-08-14 NOTE — Telephone Encounter (Signed)
New message       1. Which medications need to be refilled? (please list name of each medication and dose if known) digoxin (LANOXIN) 0.125 MG tablet,   2. Which pharmacy/location (including street and city if local pharmacy) is medication to be sent to? [facility] [place] [facility], [place]  3. Do they need a 30 day or 90 day supply? 90      1. Which medications need to be refilled? (please list name of each medication and dose if known) digoxin (LANOXIN) 0.125 MG tablet,   2. Which pharmacy/location (including street and city if local pharmacy) is medication to be sent to? [facility], [place]  3. Do they need a 30 day or 90 day supply? [place]

## 2017-08-18 ENCOUNTER — Other Ambulatory Visit: Payer: Self-pay | Admitting: Cardiology

## 2017-09-01 ENCOUNTER — Other Ambulatory Visit: Payer: Self-pay

## 2017-09-01 ENCOUNTER — Ambulatory Visit (INDEPENDENT_AMBULATORY_CARE_PROVIDER_SITE_OTHER): Payer: Medicare Other

## 2017-09-01 DIAGNOSIS — I4891 Unspecified atrial fibrillation: Secondary | ICD-10-CM | POA: Diagnosis not present

## 2017-11-17 ENCOUNTER — Telehealth: Payer: Self-pay | Admitting: Cardiology

## 2017-11-17 MED ORDER — AMLODIPINE BESYLATE 2.5 MG PO TABS: 2.5000 mg | ORAL_TABLET | Freq: Every day | ORAL | 2 refills | Status: DC

## 2017-11-17 NOTE — Telephone Encounter (Signed)
New Message:     *STAT* If patient is at the pharmacy, call can be transferred to refill team.   1. Which medications need to be refilled? (please list name of each medication and dose if known)   amLODipine (NORVASC) 2.5 MG tablet    [place], [place] 2. Which pharmacy/location (including street and city if local pharmacy) is medication to be sent to?   3. Do they need a 30 day or 90 day supply? 30 Day

## 2017-11-17 NOTE — Telephone Encounter (Signed)
DUPLICATE

## 2017-11-17 NOTE — Telephone Encounter (Signed)
New Message:   Patient stated the order was sent for a 30 day supply but should be a 90 day supply    *STAT* If patient is at the pharmacy, call can be transferred to refill team.   1. Which medications need to be refilled? (please list name of each medication and dose if known) amLODipine (NORVASC) 2.5 MG tablet  2. Which pharmacy/location (including street and city if local pharmacy) is medication to be sent to? [facility] [place], [place]  3. Do they need a 30 day or 90 day supply? 90 Day

## 2018-06-09 ENCOUNTER — Ambulatory Visit: Payer: TRICARE For Life (TFL) | Admitting: Cardiology

## 2018-06-16 ENCOUNTER — Other Ambulatory Visit (HOSPITAL_COMMUNITY): Payer: Medicare Other

## 2018-06-21 ENCOUNTER — Ambulatory Visit: Admit: 2018-06-21 | Payer: Medicare Other | Admitting: Ophthalmology

## 2018-06-21 SURGERY — PHACOEMULSIFICATION, CATARACT, WITH IOL INSERTION
Anesthesia: Monitor Anesthesia Care | Laterality: Left

## 2018-06-29 ENCOUNTER — Other Ambulatory Visit (HOSPITAL_COMMUNITY): Payer: Medicare Other

## 2018-07-05 ENCOUNTER — Ambulatory Visit: Admit: 2018-07-05 | Payer: TRICARE For Life (TFL) | Admitting: Ophthalmology

## 2018-07-05 SURGERY — PHACOEMULSIFICATION, CATARACT, WITH IOL INSERTION
Anesthesia: Monitor Anesthesia Care | Laterality: Right

## 2018-07-20 ENCOUNTER — Telehealth: Payer: Self-pay | Admitting: *Deleted

## 2018-07-20 NOTE — Telephone Encounter (Signed)
Called and left message for pt to call us back to discuss tomorrows appt. Either phone or [email or web address]

## 2018-07-20 NOTE — Progress Notes (Deleted)
{Choose 1 Note Type (Telehealth Visit or Telephone Visit):[phone]}   Date:  07/20/2018   ID:  Oscar [name], DOB 1940/05/03, MRN [id number]  {Patient Location:[phone]::"Home"} {Provider Location:([phone]::"Home"}  PCP:  Dione Housekeeper, MD  Cardiologist:  No primary care provider on file. *** Electrophysiologist:  None   Evaluation Performed:  {Choose Visit [phone]::"Follow-Up Visit"}  Chief Complaint:  ***  History of Present Illness:    Oscar [name] is a 78 y.o. male who presents for follow up of  atrial fibrillation and previous cardiomyopathy Since I last saw him ***    he has had pneumonia.  He reports that he was hospitalized at [facility].  He reports that he had pneumonia.  He also describes acute on chronic heart failure but does not report any cardiac work-up.  He was sent home on oxygen.  His daughter aged 34 died since I last saw him.  He is not breathing quite back to baseline.  He only uses the oxygen as needed.  He wears at night.  He is not having any cough fevers or chills.  He said no PND or orthopnea.  He denies any palpitations, presyncope or syncope.   The patient {does/does not:200015} have symptoms concerning for COVID-19 infection (fever, chills, cough, or new shortness of breath).    Past Medical History:  Diagnosis Date  . Anemia   . Atrial fibrillation ([place])    permanent  . Cardiomyopathy    EF 35% in the past--Improved to 55% echo 2/10. Moderately severe mitral regurgitation in the past, now improved,  . Carotid artery stenosis   . CHF (congestive heart failure) ([place]) 2002  . COPD (chronic obstructive pulmonary disease) ([place])   . Coronary artery disease    non obstructive  . Diabetes mellitus   . Dyslipidemia   . History of tobacco abuse   . Nephrolithiasis   . Tremors of nervous system    Past Surgical History:  Procedure Laterality Date  . CAROTID ENDARTERECTOMY Left 10-21-05   cea  . left carotid endarterectomy     . TONSILLECTOMY       No outpatient medications have been marked as taking for the 07/21/18 encounter (Appointment) with Minus Breeding, MD.     Allergies:   Penicillins and Statins   Social History   Tobacco Use  . Smoking status: Former Smoker    Types: Cigarettes    Last attempt to quit: 02/25/2000    Years since quitting: 18.4  . Smokeless tobacco: Former Network engineer Use Topics  . Alcohol use: No  . Drug use: No     Family Hx: The patient's family history includes Coronary artery disease in his mother and another family member; Deep vein thrombosis in his son; Diabetes in his daughter and mother; Heart attack in his brother, mother, sister, and son; Heart disease in his brother, mother, and sister; Heart disease (age of onset: 49) in his son; Hyperlipidemia in his brother; Hypertension in his brother, daughter, mother, and sister; Other in his brother, daughter, and mother.  ROS:   Please see the history of present illness.    *** All other systems reviewed and are negative.   Prior CV studies:   The following studies were reviewed today:  ***  Labs/Other Tests and Data Reviewed:    EKG:  {EKG/Telemetry Strips Reviewed:[phone]}  Recent Labs: No results found for requested labs within last 8760 hours.   Recent Lipid Panel No results found for: CHOL, TRIG, HDL, CHOLHDL, LDLCALC,  LDLDIRECT  Wt Readings from Last 3 Encounters:  07/22/17 177 lb (80.3 kg)  06/25/16 182 lb (82.6 kg)  03/20/16 177 lb (80.3 kg)     Objective:    Vital Signs:  There were no vitals taken for this visit.   {HeartCare Virtual Exam (Optional):[phone]::"VITAL SIGNS:  reviewed"}  ASSESSMENT & PLAN:    ATRIAL FIBRILLATION:  Oscar [name] has a CHA2DS2 - VASc score of 5.  ***He tolerates anticoagulation with rate control.  No change in therapy.  CARDIOMYOPATHY:  Hiis ejection fraction was improved on echo in 2016. ***  However, given the recent report of heart failure  when he was hospitalized I will follow-up with an echocardiogram.   CAROTID STENOSIS:  This was to be followed by Dr. Donnetta Hutching.  *** He had follow up in July of last year with moderate left disease.    HTN:  *** he blood pressure is at target.  No change in therapy.   LIPIDS:  LDL was *** 62 earlier this month.  No change in therapy.   DM:  A1C was *** 8.4.  Follow up per Dione Housekeeper, MD  COVID-19 Education: The signs and symptoms of COVID-19 were discussed with the patient and how to seek care for testing (follow up with PCP or arrange E-visit).  ***The importance of social distancing was discussed today.  Time:   Today, I have spent *** minutes with the patient with telehealth technology discussing the above problems.     Medication Adjustments/Labs and Tests Ordered: Current medicines are reviewed at length with the patient today.  Concerns regarding medicines are outlined above.   Tests Ordered: No orders of the defined types were placed in this encounter.   Medication Changes: No orders of the defined types were placed in this encounter.   Disposition:  Follow up {follow up:15908}  Signed, Minus Breeding, MD  07/20/2018 9:43 PM    [facility]

## 2018-07-21 ENCOUNTER — Encounter (HOSPITAL_COMMUNITY): Payer: Self-pay | Admitting: Emergency Medicine

## 2018-07-21 ENCOUNTER — Telehealth: Payer: Self-pay | Admitting: Cardiology

## 2018-07-21 ENCOUNTER — Emergency Department (HOSPITAL_COMMUNITY): Payer: Medicare Other

## 2018-07-21 ENCOUNTER — Emergency Department (HOSPITAL_COMMUNITY)
Admission: EM | Admit: 2018-07-21 | Discharge: 2018-07-21 | Disposition: A | Discharge status: Home / Self Care | Payer: Medicare Other | Attending: Emergency Medicine | Admitting: Emergency Medicine

## 2018-07-21 ENCOUNTER — Telehealth: Payer: Self-pay | Admitting: *Deleted

## 2018-07-21 ENCOUNTER — Other Ambulatory Visit: Payer: Self-pay

## 2018-07-21 ENCOUNTER — Telehealth: Payer: Medicare Other | Admitting: Cardiology

## 2018-07-21 ENCOUNTER — Ambulatory Visit: Payer: TRICARE For Life (TFL) | Admitting: Cardiology

## 2018-07-21 DIAGNOSIS — I2510 Atherosclerotic heart disease of native coronary artery without angina pectoris: Secondary | ICD-10-CM | POA: Diagnosis not present

## 2018-07-21 DIAGNOSIS — R609 Edema, unspecified: Secondary | ICD-10-CM | POA: Diagnosis not present

## 2018-07-21 DIAGNOSIS — I509 Heart failure, unspecified: Secondary | ICD-10-CM | POA: Diagnosis not present

## 2018-07-21 DIAGNOSIS — Z87891 Personal history of nicotine dependence: Secondary | ICD-10-CM | POA: Insufficient documentation

## 2018-07-21 DIAGNOSIS — Z7901 Long term (current) use of anticoagulants: Secondary | ICD-10-CM | POA: Insufficient documentation

## 2018-07-21 DIAGNOSIS — Z7984 Long term (current) use of oral hypoglycemic drugs: Secondary | ICD-10-CM | POA: Diagnosis not present

## 2018-07-21 DIAGNOSIS — J449 Chronic obstructive pulmonary disease, unspecified: Secondary | ICD-10-CM | POA: Diagnosis not present

## 2018-07-21 DIAGNOSIS — E119 Type 2 diabetes mellitus without complications: Secondary | ICD-10-CM | POA: Insufficient documentation

## 2018-07-21 DIAGNOSIS — Z79899 Other long term (current) drug therapy: Secondary | ICD-10-CM | POA: Diagnosis not present

## 2018-07-21 LAB — BRAIN NATRIURETIC PEPTIDE: B Natriuretic Peptide: 2433 pg/mL — ABNORMAL HIGH (ref 0.0–100.0)

## 2018-07-21 LAB — CBC WITH DIFFERENTIAL/PLATELET
Abs Immature Granulocytes: 0.04 10*3/uL (ref 0.00–0.07)
Basophils Absolute: 0 10*3/uL (ref 0.0–0.1)
Basophils Relative: 1 %
Eosinophils Absolute: 0.1 10*3/uL (ref 0.0–0.5)
Eosinophils Relative: 2 %
HCT: 41.8 % (ref 39.0–52.0)
Hemoglobin: 12.8 g/dL — ABNORMAL LOW (ref 13.0–17.0)
Immature Granulocytes: 1 %
Lymphocytes Relative: 16 %
Lymphs Abs: 1.1 10*3/uL (ref 0.7–4.0)
MCH: 26.9 pg (ref 26.0–34.0)
MCHC: 30.6 g/dL (ref 30.0–36.0)
MCV: 87.8 fL (ref 80.0–100.0)
Monocytes Absolute: 0.7 10*3/uL (ref 0.1–1.0)
Monocytes Relative: 10 %
Neutro Abs: 5.2 10*3/uL (ref 1.7–7.7)
Neutrophils Relative %: 70 %
Platelets: 374 10*3/uL (ref 150–400)
RBC: 4.76 MIL/uL (ref 4.22–5.81)
RDW: 17.5 % — ABNORMAL HIGH (ref 11.5–15.5)
WBC: 7.2 10*3/uL (ref 4.0–10.5)
nRBC: 0 % (ref 0.0–0.2)

## 2018-07-21 LAB — BASIC METABOLIC PANEL
Anion gap: 12 (ref 5–15)
BUN: 25 mg/dL — ABNORMAL HIGH (ref 8–23)
CO2: 27 mmol/L (ref 22–32)
Calcium: 9.8 mg/dL (ref 8.9–10.3)
Chloride: 102 mmol/L (ref 98–111)
Creatinine, Ser: 1.27 mg/dL — ABNORMAL HIGH (ref 0.61–1.24)
GFR calc Af Amer: >60 mL/min (ref >60)
GFR calc non Af Amer: 53 mL/min — ABNORMAL LOW (ref >60)
Glucose, Bld: 177 mg/dL — ABNORMAL HIGH (ref 70–99)
Potassium: 3.8 mmol/L (ref 3.5–5.1)
Sodium: 141 mmol/L (ref 135–145)

## 2018-07-21 MED ORDER — POTASSIUM CHLORIDE CRYS ER 20 MEQ PO TBCR
60.0000 meq | EXTENDED_RELEASE_TABLET | Freq: Once | ORAL | Status: AC
  Administered 2018-07-21: 19:00:00 60 meq via ORAL
  Filled 2018-07-21: qty 3

## 2018-07-21 MED ORDER — FUROSEMIDE 10 MG/ML IJ SOLN
80.0000 mg | Freq: Once | INTRAMUSCULAR | Status: AC
Start: 2018-07-21 — End: 2018-07-21
  Administered 2018-07-21: 19:00:00 80 mg via INTRAVENOUS
  Filled 2018-07-21: qty 8

## 2018-07-21 MED ORDER — POTASSIUM CHLORIDE ER 20 MEQ PO TBCR: 10.0000 meq | EXTENDED_RELEASE_TABLET | Freq: Every day | ORAL | 0 refills | Status: DC

## 2018-07-21 MED ORDER — FUROSEMIDE 40 MG PO TABS: 40.0000 mg | ORAL_TABLET | Freq: Two times a day (BID) | ORAL | 0 refills | Status: DC

## 2018-07-21 NOTE — Telephone Encounter (Signed)
Called patient to get vitals etc. For Dr. Cherlyn Cushing evisit. Spoke with his wife. Husband shouted in the background that he wasn't willing to pay for a phone visit. Wife states that he has to sleep sitting up in a chair, is having trouble breathing and has swelling. Pt would not share his weight or any other info. Wife feels like he needs to be seen. I told her I would let Dr. Percival Spanish know the above info.

## 2018-07-21 NOTE — ED Provider Notes (Signed)
[facility] Provider Note   CSN: [id number] Arrival date & time: 07/21/18  1654    History   Chief Complaint Chief Complaint  Patient presents with  . Leg Swelling    HPI Oscar [name] is a 78 y.o. male.     HPI   78yM with dyspnea. Worsening over the past couple weeks. Increasing LE edema over the same time period. Wet cough. No fever. No pain. +orthopnea. Reports compliance with his medications. No sick contacts that he is aware of.   Past Medical History:  Diagnosis Date  . Anemia   . Atrial fibrillation ([place])    permanent  . Cardiomyopathy    EF 35% in the past--Improved to 55% echo 2/10. Moderately severe mitral regurgitation in the past, now improved,  . Carotid artery stenosis   . CHF (congestive heart failure) ([place]) 2002  . COPD (chronic obstructive pulmonary disease) ([place])   . Coronary artery disease    non obstructive  . Diabetes mellitus   . Dyslipidemia   . History of tobacco abuse   . Nephrolithiasis   . Tremors of nervous system     Patient Active Problem List   Diagnosis Date Noted  . History of left-sided carotid endarterectomy 02/27/2015  . Carotid artery stenosis 02/27/2015  . Encounter for postoperative carotid endarterectomy surveillance 02/27/2015  . Aftercare following surgery of the circulatory system, [place] 08/10/2012  . Skin lesion 07/02/2011  . MITRAL REGURGITATION 05/03/2009  . CARDIOMYOPATHY 05/03/2009  . CAROTID ARTERY DISEASE 05/02/2009  . ATRIAL FIBRILLATION 08/29/2008    Past Surgical History:  Procedure Laterality Date  . CAROTID ENDARTERECTOMY Left 10-21-05   cea  . left carotid endarterectomy    . TONSILLECTOMY          Home Medications    Prior to Admission medications   Medication Sig Start Date End Date Taking? Authorizing Provider  albuterol (PROAIR HFA) 108 (90 BASE) MCG/ACT inhaler Inhale 2 puffs into the lungs. 02/09/14   [provider]  amLODipine (NORVASC) 2.5 MG tablet  Take 1 tablet (2.5 mg total) by mouth daily. 11/17/17   Minus Breeding, MD  carvedilol (COREG) 25 MG tablet TAKE 1 TABLET TWICE A DAY WITH MEALS (DUE FOR OFFICE VISIT WITH DOCTOR [place]) 08/03/17   Minus Breeding, MD  colesevelam [facility]) 625 MG tablet Take 1,250 mg by mouth 3 (three) times daily.     [provider]  digoxin (LANOXIN) 0.125 MG tablet Take 1 tablet (0.125 mg total) by mouth daily. 08/14/17   Minus Breeding, MD  ezetimibe (ZETIA) 10 MG tablet Take 10 mg by mouth daily.      [provider]  fenofibrate 160 MG tablet Take 160 mg by mouth daily.    [provider]  furosemide (LASIX) 20 MG tablet Take 0.5 tablets (10 mg total) by mouth daily. 05/21/16   Minus Breeding, MD  glipiZIDE (GLUCOTROL XL) 10 MG 24 hr tablet TAKE 1 TABLET BY MOUTH TWICE DAILY 05/22/14   [provider]  linagliptin (TRADJENTA) 5 MG TABS tablet Take 5 mg by mouth daily.  05/15/14 07/22/17  [provider]  lisinopril (PRINIVIL,ZESTRIL) 40 MG tablet Take 40 mg by mouth daily.     [provider]  metFORMIN (GLUCOPHAGE-XR) 500 MG 24 hr tablet Take 500 mg by mouth 2 (two) times daily.     [provider]  Multiple Vitamin (MULTIVITAMIN) tablet Take 1 tablet by mouth daily.    [provider]  Omega-3  Fatty Acids (FISH OIL) 1200 MG CAPS Take by mouth 3 (three) times daily.    [provider]  warfarin (COUMADIN) 7.5 MG tablet Take 7.5 mg by mouth daily.      [provider]    Family History Family History  Problem Relation Age of Onset  . Coronary artery disease Mother   . Diabetes Mother   . Heart disease Mother        Before age 37 and  CHF  . Hypertension Mother   . Other Mother        varicose veins  . Heart attack Mother   . Heart disease Sister   . Hypertension Sister   . Heart attack Sister   . Heart disease Brother   . Hyperlipidemia Brother   . Hypertension Brother   . Other Brother        varicose  veins  . Heart attack Brother   . Heart disease Son 47       Heart Disease before age 26- Open hear surgery  . Deep vein thrombosis Son   . Heart attack Son   . Coronary artery disease Other   . Diabetes Daughter   . Hypertension Daughter   . Other Daughter        varicose veins    Social History Social History   Tobacco Use  . Smoking status: Former Smoker    Types: Cigarettes    Last attempt to quit: 02/25/2000    Years since quitting: 18.4  . Smokeless tobacco: Former Network engineer Use Topics  . Alcohol use: No  . Drug use: No     Allergies   Penicillins and Statins   Review of Systems Review of Systems  All systems reviewed and negative, other than as noted in HPI.  Physical Exam Updated Vital Signs BP 121/65   Pulse 76   Temp 97.8 F (36.6 C) (Oral)   Resp 17   Ht 5\' 6"  (1.676 m)   Wt 76.2 kg   SpO2 95%   BMI 27.12 kg/m   Physical Exam Vitals signs and nursing note reviewed.  Constitutional:      General: He is not in acute distress.    Appearance: He is well-developed.  HENT:     Head: Normocephalic and atraumatic.  Eyes:     General:        Right eye: No discharge.        Left eye: No discharge.     Conjunctiva/sclera: Conjunctivae normal.  Neck:     Musculoskeletal: Neck supple.  Cardiovascular:     Rate and Rhythm: Normal rate. Rhythm irregular.     Heart sounds: Normal heart sounds. No murmur. No friction rub. No gallop.   Pulmonary:     Comments: Mild tachypnea. Crackles b/l bases.  Abdominal:     General: There is no distension.     Palpations: Abdomen is soft.     Tenderness: There is no abdominal tenderness.  Musculoskeletal:        General: No tenderness.     Right lower leg: Edema present.     Left lower leg: Edema present.     Comments: Symmetric pitting LE edema  Skin:    General: Skin is warm and dry.  Neurological:     Mental Status: He is alert.  Psychiatric:        Behavior: Behavior normal.        Thought  Content: Thought content normal.  ED Treatments / Results  Labs (all labs ordered are listed, but only abnormal results are displayed) Labs Reviewed  BRAIN NATRIURETIC PEPTIDE - Abnormal; Notable for the following components:      Result Value   B Natriuretic Peptide 2,433.0 (*)    All other components within normal limits  CBC WITH DIFFERENTIAL/PLATELET - Abnormal; Notable for the following components:   Hemoglobin 12.8 (*)    RDW 17.5 (*)    All other components within normal limits  BASIC METABOLIC PANEL - Abnormal; Notable for the following components:   Glucose, Bld 177 (*)    BUN 25 (*)    Creatinine, Ser 1.27 (*)    GFR calc non Af Amer 53 (*)    All other components within normal limits    EKG EKG Interpretation  Date/Time:  Wednesday Jul 21 2018 18:01:21 EDT Ventricular Rate:  88 PR Interval:    QRS Duration: 93 QT Interval:  297 QTC Calculation: 360 R Axis:   99 Text Interpretation:  Atrial fibrillation Anteroseptal infarct, age indeterminate Baseline wander in lead(s) V2 No previous ECGs available Confirmed by Wandra Arthurs ([id number]) on 07/22/2018 1:23:04 PM   Radiology Dg Chest Portable 1 View  Result Date: 07/21/2018 CLINICAL DATA:  Dyspnea EXAM: PORTABLE CHEST 1 VIEW COMPARISON:  10/17/2004 FINDINGS: The heart size is enlarged. There are prominent interstitial lung markings bilaterally. There are small bilateral pleural effusions. There is adjacent compressive presumed atelectasis. There is no pneumothorax. No acute osseous abnormality. IMPRESSION: Congestive heart failure with small bilateral pleural effusions. Electronically Signed   By: Constance Holster M.D.   On: 07/21/2018 18:23    Procedures Procedures (including critical care time)  Medications Ordered in ED Medications  furosemide (LASIX) injection 80 mg (80 mg Intravenous Given 07/21/18 1856)  potassium chloride SA (K-DUR) CR tablet 60 mEq (60 mEq Oral Given 07/21/18 1856)     Initial  Impression / Assessment and Plan / ED Course  I have reviewed the triage vital signs and the nursing notes.  Pertinent labs & imaging results that were available during my care of the patient were reviewed by me and considered in my medical decision making (see chart for details).     78yM with dyspnea. CHF exacerbation. Increasing peripheral edema. CXR with edema. BNP elevated. Sounds wet on exam. Orthopnea.   His o2 sats are fine on RA but he looks dyspneic just at rest. He declines admission to the hospital. Will increase his lasix. Says he takes 40mg  daily. Given 80 IV in the ED and will have him increase to 40 mg BID at home. Advised that I want him to follow-up with either his PCP or Dr Percival Spanish and come back to ED if symptoms worsening in the interim.   Oscar [name] was evaluated in Emergency Department on 07/22/2018 for the symptoms described in the history of present illness. He was evaluated in the context of the global COVID-19 pandemic, which necessitated consideration that the patient might be at risk for infection with the SARS-CoV-2 virus that causes COVID-19. Institutional protocols and algorithms that pertain to the evaluation of patients at risk for COVID-19 are in a state of rapid change based on information released by regulatory bodies including the CDC and federal and state organizations. These policies and algorithms were followed during the patient's care in the ED.   Final Clinical Impressions(s) / ED Diagnoses   Final diagnoses:  Peripheral edema  Congestive heart failure, unspecified HF chronicity, unspecified heart failure  type [facility])    ED Discharge Orders    None       Virgel Manifold, MD 07/22/18 1424

## 2018-07-21 NOTE — Telephone Encounter (Signed)
Patient's wife called to state that her husband agreed to go to [facility].

## 2018-07-21 NOTE — ED Triage Notes (Signed)
Pt states that he has been having swelling his lower legs for the past couple of weeks.

## 2018-07-21 NOTE — Telephone Encounter (Signed)
New Message ° ° ° °Pts wife is returning a call  ° °Please call back  °

## 2018-07-21 NOTE — Telephone Encounter (Signed)
The patient was supposed to have a telehealth visit with me.  However, he refused to come to the phone.  He spoke to his wife a couple of times.  She thinks he is depressed and given.  He lost his daughter earlier this year and has had several other deaths in his family.  He was hospitalized at [facility] with acute on chronic heart failure and is previously had diastolic heart failure.  She says he is getting increasing shortness of breath.  He is having to sit up to breathe.  He is having some swelling in his legs.  I offered him an appointment in the office in [place] as we are not in [place].  He refused to come to [facility].  I suggested that we could potentially adjust his medications over the phone but his wife does not know how much a Lasix he is taking and he will tell her.  I suggested that she take him to [facility] or call 911 as we cannot do anything for him in this situation.  She says she will try to see if he will consent to this.

## 2018-07-21 NOTE — Telephone Encounter (Signed)
Pt's wife is taking pt to [facility] hospital to be evaluated Will let Dr Percival Spanish know/cy

## 2018-07-21 NOTE — Discharge Instructions (Addendum)
Take 40 mg of lasix twice a day for 1 week and then resume previous dosing. Follow-up with Dr Percival Spanish or your PCP. Return to the ER for worsening symptoms.

## 2018-07-24 ENCOUNTER — Emergency Department (HOSPITAL_COMMUNITY): Payer: Medicare Other

## 2018-07-24 ENCOUNTER — Inpatient Hospital Stay (HOSPITAL_COMMUNITY)
Admission: EM | Admit: 2018-07-24 | Discharge: 2018-08-02 | DRG: 069 | Disposition: A | Discharge status: Home / Self Care | Payer: Medicare Other | Attending: Internal Medicine | Admitting: Internal Medicine

## 2018-07-24 ENCOUNTER — Other Ambulatory Visit: Payer: Self-pay

## 2018-07-24 ENCOUNTER — Encounter (HOSPITAL_COMMUNITY): Payer: Self-pay | Admitting: Emergency Medicine

## 2018-07-24 DIAGNOSIS — E785 Hyperlipidemia, unspecified: Secondary | ICD-10-CM | POA: Diagnosis present

## 2018-07-24 DIAGNOSIS — I110 Hypertensive heart disease with heart failure: Secondary | ICD-10-CM | POA: Diagnosis not present

## 2018-07-24 DIAGNOSIS — Z452 Encounter for adjustment and management of vascular access device: Secondary | ICD-10-CM

## 2018-07-24 DIAGNOSIS — Z87891 Personal history of nicotine dependence: Secondary | ICD-10-CM | POA: Diagnosis not present

## 2018-07-24 DIAGNOSIS — Z888 Allergy status to other drugs, medicaments and biological substances status: Secondary | ICD-10-CM

## 2018-07-24 DIAGNOSIS — I272 Pulmonary hypertension, unspecified: Secondary | ICD-10-CM | POA: Diagnosis present

## 2018-07-24 DIAGNOSIS — Z880 Allergy status to penicillin: Secondary | ICD-10-CM

## 2018-07-24 DIAGNOSIS — J9621 Acute and chronic respiratory failure with hypoxia: Secondary | ICD-10-CM | POA: Diagnosis not present

## 2018-07-24 DIAGNOSIS — Z8249 Family history of ischemic heart disease and other diseases of the circulatory system: Secondary | ICD-10-CM

## 2018-07-24 DIAGNOSIS — I6529 Occlusion and stenosis of unspecified carotid artery: Secondary | ICD-10-CM | POA: Diagnosis present

## 2018-07-24 DIAGNOSIS — Z9981 Dependence on supplemental oxygen: Secondary | ICD-10-CM

## 2018-07-24 DIAGNOSIS — I4891 Unspecified atrial fibrillation: Secondary | ICD-10-CM

## 2018-07-24 DIAGNOSIS — I4821 Permanent atrial fibrillation: Secondary | ICD-10-CM | POA: Diagnosis present

## 2018-07-24 DIAGNOSIS — G459 Transient cerebral ischemic attack, unspecified: Principal | ICD-10-CM | POA: Diagnosis present

## 2018-07-24 DIAGNOSIS — Z8349 Family history of other endocrine, nutritional and metabolic diseases: Secondary | ICD-10-CM

## 2018-07-24 DIAGNOSIS — I6521 Occlusion and stenosis of right carotid artery: Secondary | ICD-10-CM

## 2018-07-24 DIAGNOSIS — Z20828 Contact with and (suspected) exposure to other viral communicable diseases: Secondary | ICD-10-CM | POA: Diagnosis not present

## 2018-07-24 DIAGNOSIS — Z8673 Personal history of transient ischemic attack (TIA), and cerebral infarction without residual deficits: Secondary | ICD-10-CM | POA: Diagnosis not present

## 2018-07-24 DIAGNOSIS — I255 Ischemic cardiomyopathy: Secondary | ICD-10-CM | POA: Diagnosis present

## 2018-07-24 DIAGNOSIS — Z9889 Other specified postprocedural states: Secondary | ICD-10-CM

## 2018-07-24 DIAGNOSIS — Z7984 Long term (current) use of oral hypoglycemic drugs: Secondary | ICD-10-CM | POA: Diagnosis not present

## 2018-07-24 DIAGNOSIS — R4781 Slurred speech: Secondary | ICD-10-CM | POA: Diagnosis present

## 2018-07-24 DIAGNOSIS — E876 Hypokalemia: Secondary | ICD-10-CM | POA: Diagnosis not present

## 2018-07-24 DIAGNOSIS — Z7901 Long term (current) use of anticoagulants: Secondary | ICD-10-CM | POA: Diagnosis not present

## 2018-07-24 DIAGNOSIS — I5023 Acute on chronic systolic (congestive) heart failure: Secondary | ICD-10-CM | POA: Diagnosis not present

## 2018-07-24 DIAGNOSIS — E1151 Type 2 diabetes mellitus with diabetic peripheral angiopathy without gangrene: Secondary | ICD-10-CM | POA: Diagnosis not present

## 2018-07-24 DIAGNOSIS — I248 Other forms of acute ischemic heart disease: Secondary | ICD-10-CM | POA: Diagnosis not present

## 2018-07-24 DIAGNOSIS — Z833 Family history of diabetes mellitus: Secondary | ICD-10-CM | POA: Diagnosis not present

## 2018-07-24 DIAGNOSIS — R0602 Shortness of breath: Secondary | ICD-10-CM

## 2018-07-24 DIAGNOSIS — R570 Cardiogenic shock: Secondary | ICD-10-CM | POA: Diagnosis not present

## 2018-07-24 DIAGNOSIS — R29700 NIHSS score 0: Secondary | ICD-10-CM | POA: Diagnosis present

## 2018-07-24 DIAGNOSIS — J449 Chronic obstructive pulmonary disease, unspecified: Secondary | ICD-10-CM | POA: Diagnosis not present

## 2018-07-24 DIAGNOSIS — I083 Combined rheumatic disorders of mitral, aortic and tricuspid valves: Secondary | ICD-10-CM | POA: Diagnosis not present

## 2018-07-24 DIAGNOSIS — E1165 Type 2 diabetes mellitus with hyperglycemia: Secondary | ICD-10-CM

## 2018-07-24 DIAGNOSIS — I2510 Atherosclerotic heart disease of native coronary artery without angina pectoris: Secondary | ICD-10-CM | POA: Diagnosis present

## 2018-07-24 DIAGNOSIS — W109XXA Fall (on) (from) unspecified stairs and steps, initial encounter: Secondary | ICD-10-CM

## 2018-07-24 DIAGNOSIS — R4182 Altered mental status, unspecified: Secondary | ICD-10-CM | POA: Diagnosis not present

## 2018-07-24 LAB — COMPREHENSIVE METABOLIC PANEL
ALT: 20 U/L (ref 0–44)
AST: 30 U/L (ref 15–41)
Albumin: 3.8 g/dL (ref 3.5–5.0)
Alkaline Phosphatase: 33 U/L — ABNORMAL LOW (ref 38–126)
Anion gap: 12 (ref 5–15)
BUN: 27 mg/dL — ABNORMAL HIGH (ref 8–23)
CO2: 25 mmol/L (ref 22–32)
Calcium: 9 mg/dL (ref 8.9–10.3)
Chloride: 100 mmol/L (ref 98–111)
Creatinine, Ser: 1.22 mg/dL (ref 0.61–1.24)
GFR calc Af Amer: >60 mL/min (ref >60)
GFR calc non Af Amer: 56 mL/min — ABNORMAL LOW (ref >60)
Glucose, Bld: 68 mg/dL — ABNORMAL LOW (ref 70–99)
Potassium: 3.9 mmol/L (ref 3.5–5.1)
Sodium: 137 mmol/L (ref 135–145)
Total Bilirubin: 0.8 mg/dL (ref 0.3–1.2)
Total Protein: 6.7 g/dL (ref 6.5–8.1)

## 2018-07-24 LAB — PROTIME-INR
INR: 3.6 — ABNORMAL HIGH (ref 0.8–1.2)
Prothrombin Time: 35.5 seconds — ABNORMAL HIGH (ref 11.4–15.2)

## 2018-07-24 LAB — URINALYSIS, ROUTINE W REFLEX MICROSCOPIC
Bilirubin Urine: NEGATIVE
Glucose, UA: NEGATIVE mg/dL
Hgb urine dipstick: NEGATIVE
Ketones, ur: NEGATIVE mg/dL
Leukocytes,Ua: NEGATIVE
Nitrite: NEGATIVE
Protein, ur: NEGATIVE mg/dL
Specific Gravity, Urine: 1.009 (ref 1.005–1.030)
pH: 6 (ref 5.0–8.0)

## 2018-07-24 LAB — RAPID URINE DRUG SCREEN, HOSP PERFORMED
Amphetamines: NOT DETECTED
Barbiturates: NOT DETECTED
Benzodiazepines: NOT DETECTED
Cocaine: NOT DETECTED
Opiates: NOT DETECTED
Tetrahydrocannabinol: NOT DETECTED

## 2018-07-24 LAB — CBC
HCT: 39.4 % (ref 39.0–52.0)
Hemoglobin: 12.3 g/dL — ABNORMAL LOW (ref 13.0–17.0)
MCH: 27 pg (ref 26.0–34.0)
MCHC: 31.2 g/dL (ref 30.0–36.0)
MCV: 86.6 fL (ref 80.0–100.0)
Platelets: 386 10*3/uL (ref 150–400)
RBC: 4.55 MIL/uL (ref 4.22–5.81)
RDW: 17 % — ABNORMAL HIGH (ref 11.5–15.5)
WBC: 8.5 10*3/uL (ref 4.0–10.5)
nRBC: 0 % (ref 0.0–0.2)

## 2018-07-24 LAB — DIFFERENTIAL
Abs Immature Granulocytes: 0.06 10*3/uL (ref 0.00–0.07)
Basophils Absolute: 0 10*3/uL (ref 0.0–0.1)
Basophils Relative: 1 %
Eosinophils Absolute: 0.1 10*3/uL (ref 0.0–0.5)
Eosinophils Relative: 1 %
Immature Granulocytes: 1 %
Lymphocytes Relative: 15 %
Lymphs Abs: 1.3 10*3/uL (ref 0.7–4.0)
Monocytes Absolute: 0.7 10*3/uL (ref 0.1–1.0)
Monocytes Relative: 8 %
Neutro Abs: 6.3 10*3/uL (ref 1.7–7.7)
Neutrophils Relative %: 74 %

## 2018-07-24 LAB — APTT: aPTT: 37 seconds — ABNORMAL HIGH (ref 24–36)

## 2018-07-24 LAB — SARS CORONAVIRUS 2 BY RT PCR (HOSPITAL ORDER, PERFORMED IN ~~LOC~~ HOSPITAL LAB): SARS Coronavirus 2: NEGATIVE

## 2018-07-24 LAB — ETHANOL: Alcohol, Ethyl (B): <10 mg/dL (ref <10)

## 2018-07-24 MED ORDER — STROKE: EARLY STAGES OF RECOVERY BOOK
Freq: Once | Status: AC
  Administered 2018-07-24: 22:00:00
  Filled 2018-07-24: qty 1

## 2018-07-24 MED ORDER — ASPIRIN 81 MG PO CHEW
324.0000 mg | CHEWABLE_TABLET | Freq: Once | ORAL | Status: AC
  Administered 2018-07-24: 324 mg via ORAL
  Filled 2018-07-24: qty 4

## 2018-07-24 MED ORDER — EZETIMIBE 10 MG PO TABS
10.0000 mg | ORAL_TABLET | Freq: Every day | ORAL | Status: DC
  Administered 2018-07-25 – 2018-08-02 (×9): 10 mg via ORAL
  Filled 2018-07-24 (×9): qty 1

## 2018-07-24 MED ORDER — FENOFIBRATE 160 MG PO TABS
160.0000 mg | ORAL_TABLET | Freq: Every day | ORAL | Status: DC
  Administered 2018-07-24 – 2018-08-02 (×10): 160 mg via ORAL
  Filled 2018-07-24 (×10): qty 1

## 2018-07-24 MED ORDER — OMEGA-3-ACID ETHYL ESTERS 1 G PO CAPS
2.0000 g | ORAL_CAPSULE | Freq: Two times a day (BID) | ORAL | Status: DC
  Administered 2018-07-24 – 2018-08-02 (×18): 2 g via ORAL
  Filled 2018-07-24 (×19): qty 2

## 2018-07-24 MED ORDER — DIGOXIN 125 MCG PO TABS
0.1250 mg | ORAL_TABLET | Freq: Every day | ORAL | Status: DC
  Administered 2018-07-25 – 2018-08-02 (×9): 0.125 mg via ORAL
  Filled 2018-07-24 (×9): qty 1

## 2018-07-24 MED ORDER — COLESEVELAM HCL 625 MG PO TABS
1250.0000 mg | ORAL_TABLET | Freq: Three times a day (TID) | ORAL | Status: DC
  Filled 2018-07-24 (×9): qty 2

## 2018-07-24 MED ORDER — SODIUM CHLORIDE 0.9 % IV SOLN
INTRAVENOUS | Status: DC
  Administered 2018-07-24: 22:00:00 via INTRAVENOUS

## 2018-07-24 MED ORDER — ADULT MULTIVITAMIN W/MINERALS CH
1.0000 | ORAL_TABLET | Freq: Every day | ORAL | Status: DC
  Administered 2018-07-24 – 2018-08-02 (×10): 1 via ORAL
  Filled 2018-07-24 (×10): qty 1

## 2018-07-24 MED ORDER — ACETAMINOPHEN 160 MG/5ML PO SOLN: 650.0000 mg | ORAL | Status: DC | PRN

## 2018-07-24 MED ORDER — ONE-DAILY MULTI VITAMINS PO TABS: 1.0000 | ORAL_TABLET | Freq: Every day | ORAL | Status: DC

## 2018-07-24 MED ORDER — IOHEXOL 350 MG/ML SOLN
75.0000 mL | Freq: Once | INTRAVENOUS | Status: AC | PRN
  Administered 2018-07-24: 75 mL via INTRAVENOUS

## 2018-07-24 MED ORDER — HYDRALAZINE HCL 20 MG/ML IJ SOLN: 5.0000 mg | INTRAMUSCULAR | Status: DC | PRN

## 2018-07-24 MED ORDER — ACETAMINOPHEN 650 MG RE SUPP: 650.0000 mg | RECTAL | Status: DC | PRN

## 2018-07-24 MED ORDER — SENNOSIDES-DOCUSATE SODIUM 8.6-50 MG PO TABS
1.0000 | ORAL_TABLET | Freq: Every evening | ORAL | Status: DC | PRN
  Administered 2018-07-27 – 2018-07-29 (×3): 1 via ORAL
  Filled 2018-07-24 (×3): qty 1

## 2018-07-24 MED ORDER — ACETAMINOPHEN 325 MG PO TABS: 650.0000 mg | ORAL_TABLET | ORAL | Status: DC | PRN

## 2018-07-24 NOTE — Consult Note (Addendum)
TeleNeurology Consult Note  Oscar [name]     Consulting Physician: Clabe Seal Code Status: No Order Primary Care Physician:  Dione Housekeeper, MD  DOB:  1940/07/26   Age: 78 y.o.  Date of Service: Jul 24, 2018 Admit Date:  07/24/2018   Admitting Diagnosis: <principal problem not specified>  Subjective:  Reason for Consultation: stroke  Chief Complaint: balance issues  History of present illness: 78 y/o man with h/o atrial fibrillation who presents with transient balance issues since at 1200 noon. STAT teleneurology consult requested after initial doctor to doctor phone call requested. CT head reviewed and case discussed with ED staff. Symptoms have resolved. NIHSS 0. Patient states he feels "fairly normal." Case discussed with ED attending at bedside     Review of Systems  Patient Active Problem List   Diagnosis Date Noted  . History of left-sided carotid endarterectomy 02/27/2015  . Carotid artery stenosis 02/27/2015  . Encounter for postoperative carotid endarterectomy surveillance 02/27/2015  . Aftercare following surgery of the circulatory system, [place] 08/10/2012  . Skin lesion 07/02/2011  . MITRAL REGURGITATION 05/03/2009  . CARDIOMYOPATHY 05/03/2009  . CAROTID ARTERY DISEASE 05/02/2009  . ATRIAL FIBRILLATION 08/29/2008   Past Medical History:  Diagnosis Date  . Anemia   . Atrial fibrillation ([place])    permanent  . Cardiomyopathy    EF 35% in the past--Improved to 55% echo 2/10. Moderately severe mitral regurgitation in the past, now improved,  . Carotid artery stenosis   . CHF (congestive heart failure) ([place]) 2002  . COPD (chronic obstructive pulmonary disease) ([place])   . Coronary artery disease    non obstructive  . Diabetes mellitus   . Dyslipidemia   . History of tobacco abuse   . Nephrolithiasis   . Tremors of nervous system    Past Surgical History:  Procedure Laterality Date  . CAROTID ENDARTERECTOMY Left 10-21-05   cea  . left carotid  endarterectomy    . TONSILLECTOMY     Allergies  Allergen Reactions  . Penicillins Hives    Blisters on hands  . Statins     Joint and muscle pain and low energy.    Social History   Socioeconomic History  . Marital status: Married    Spouse name: Not on file  . Number of children: 5  . Years of education: 2  . Highest education level: Not on file  Occupational History  . Occupation: Retired  Scientific laboratory technician  . Financial resource strain: Not on file  . Food insecurity:    Worry: Not on file    Inability: Not on file  . Transportation needs:    Medical: Not on file    Non-medical: Not on file  Tobacco Use  . Smoking status: Former Smoker    Types: Cigarettes    Last attempt to quit: 02/25/2000    Years since quitting: 18.4  . Smokeless tobacco: Former Network engineer and Sexual Activity  . Alcohol use: No  . Drug use: No  . Sexual activity: Not on file  Lifestyle  . Physical activity:    Days per week: Not on file    Minutes per session: Not on file  . Stress: Not on file  Relationships  . Social connections:    Talks on phone: Not on file    Gets together: Not on file    Attends religious service: Not on file    Active member of club or organization: Not on file  Attends meetings of clubs or organizations: Not on file    Relationship status: Not on file  . Intimate partner violence:    Fear of current or ex partner: Not on file    Emotionally abused: Not on file    Physically abused: Not on file    Forced sexual activity: Not on file  Other Topics Concern  . Not on file  Social History Narrative   Lives at home with his wife.   Right-handed.   3-4 cups caffeine per day.   Family History Family History  Problem Relation Age of Onset  . Coronary artery disease Mother   . Diabetes Mother   . Heart disease Mother        Before age 31 and  CHF  . Hypertension Mother   . Other Mother        varicose veins  . Heart attack Mother   . Heart disease  Sister   . Hypertension Sister   . Heart attack Sister   . Heart disease Brother   . Hyperlipidemia Brother   . Hypertension Brother   . Other Brother        varicose veins  . Heart attack Brother   . Heart disease Son 68       Heart Disease before age 37- Open hear surgery  . Deep vein thrombosis Son   . Heart attack Son   . Coronary artery disease Other   . Diabetes Daughter   . Hypertension Daughter   . Other Daughter        varicose veins      Objective:  Vital signs in last 24 hours: Pulse Rate:  [81] 81 (05/30 1446) Resp:  [15] 15 (05/30 1446) BP: (124)/(81) 124/81 (05/30 1446) SpO2:  [96 %] 96 % (05/30 1446) Weight:  [76.2 kg] 76.2 kg (05/30 1441) No data recorded.    Intake/Output last 3 shifts: No intake/output data recorded.  Intake/Output this shift: No intake/output data recorded.  Physical Exam 1a- LOC: Keenly responsive - 0      1b- LOC questions: Answers both questions correctly - 0     1c- LOC commands- Performs both tasks correctly- 0     2- Gaze: Normal; no gaze paresis or gaze deviation - 0     3- Visual Fields: normal, no Visual field deficit - 0     4- Facial movements: no facial palsy - 0     5a- Right Upper limb motor - no drift -0     5b -Left Upper limb motor - no drift -0     6a- Right Lower limb motor - no drift - 0      6b- Left Lower limb motor - no drift - 0 7- Limb Coordination: absent ataxia - 0      8- Sensory : no sensory loss - 0      9- Language - No aphasia - 0      10- Speech - No dysarthria -0     11- Neglect / Extinction - none found -0     NIHSS score   0 Patient able to ambulate without any difficulty at this time. Unassisted.    Diagnostic Findings:  Pertinent Labs:  Recent Labs  Lab 07/21/18 1754  WBC 7.2  MCV 87.8   Recent Labs  Lab 07/21/18 1754  CALCIUM 9.8  BUN 25*  GLUCOSE 177*  CREATININE 1.27*  CO2 27   No results for input(s): AST, ALT, ALBUMIN  in the last 168 hours.  Invalid input(s):  ALK PHOS, BILIRUBIN TOTAL, BILIRUBIN DIRECT, PROTEIN TOTAL, GLOBULIN No results for input(s): TRIG in the last 168 hours.  Invalid input(s): CHLPL, HDL PML, VLDL CALC, LDL CALC, CHOL/HDL RATIO, LDL/HDL RATIO, NON-HDL CHOLESTEROL Invalid input(s): CK TOTAL, TROPONIN I, CK MB No results for input(s): APTT in the last 168 hours. No results for input(s): INR in the last 168 hours.  Invalid input(s): PROTHROMBIN TIME  Extensive review of previous medical records completed.    Assessment: Patient Active Problem List   Diagnosis Date Noted  . History of left-sided carotid endarterectomy 02/27/2015  . Carotid artery stenosis 02/27/2015  . Encounter for postoperative carotid endarterectomy surveillance 02/27/2015  . Aftercare following surgery of the circulatory system, [place] 08/10/2012  . Skin lesion 07/02/2011  . MITRAL REGURGITATION 05/03/2009  . CARDIOMYOPATHY 05/03/2009  . CAROTID ARTERY DISEASE 05/02/2009  . ATRIAL FIBRILLATION 08/29/2008     Plan: [facility] TeleNeurology Consult Services  Impression: TIA     Differential Diagnosis:  1. Cardioembolic stroke  2. Small vessel disease/lacune    3. Thromboembolic, artery-to-artery mechanism 4. Hypercoagulable state-related infarct  5. Thrombotic mechanism, large artery disease 6. Transient ischemic attack  Comments: Last known well:   1200 Door time:1442 [facility] contacted:   [phone] Call back at 1507 [facility] at bedside:   1512 NIHSS assessment time:1512 Needle time:TPA not considered since NIHSS 0. Symptoms have resolved and he states feels "fairly normal." Thrombectomy not considered since large vessel occlusion is not suspected  Discussion:     Our recommendations are outlined below.  Recommendations: Admit for TIA work-up CTA head/neck pending to evaluate for LVO (low suspicion given his normal exam) ASA, IV fluids and permissive hypertension (Keep SBP < 220/110) Neurochecks DVT prophylaxis     Follow up with Neurology for further testing and evaluation  Medical Decision Making:  - Extensive number of diagnosis or management options are considered above. - Extensive amount of complex data reviewed.  - High risk of complication and/or morbidity or mortality are associated with differential diagnostic considerations above. - There may be uncertain outcome and increased probability of prolonged functional impairment or high probability of severe prolonged functional impairment associated with some of these differential diagnosis.  Medical Data Reviewed: 1.Data reviewed include clinical labs, radiology, Medical Tests; 2.Tests results discussed w/performing or interpreting physician;    3.Obtaining/reviewing old medical records; 4.Obtaining case history from another source;    5.Independent review of image, tracing or specimen.  Addendum 1630 - advanced neuroimaging reviewed and discussed with ED attending. No proximal intracranial occlusion as per my review. Not a thrombectomy candidate. Formal radiology review pending and to be f/u by ED staff

## 2018-07-24 NOTE — ED Notes (Signed)
Patient eating and drink fluids at this time.

## 2018-07-24 NOTE — ED Provider Notes (Signed)
Emergency Department Provider Note   I have reviewed the triage vital signs and the nursing notes.   HISTORY  Chief Complaint Altered Mental Status   HPI Oscar [name] is a 78 y.o. male with PMH of A-fib on Coumadin, COPD, CHF, DM, and [facility] emergency department for evaluation of acute onset mental status change with slurred speech and balance difficulty.  Patient was last seen normal at noon today.  He was riding in a car with his family when they got out to get gas the patient could not recall his pin number.  Family noted some slurred speech and that he had to lean against the car to hold himself up.  They are unsure of what exact time this occurred.  They encouraged him to present to the emergency department immediately but the patient wanted to keep driving.  They got out another time to use the bathroom and the patient had a lot of difficulty with walking.  Patient states that he is feeling better since arrival.  He feels like his strength is coming back.  He denies any vertigo sensation.  No chest pain, heart palpitation, shortness of breath.  No prior history of stroke.  He has been compliant with all of his medications.  Was seen in the emergency department on 5/27 for peripheral edema and CHF.  He was given IV Lasix and discharged home.    Past Medical History:  Diagnosis Date  . Anemia   . Atrial fibrillation ([place])    permanent  . Cardiomyopathy    EF 35% in the past--Improved to 55% echo 2/10. Moderately severe mitral regurgitation in the past, now improved,  . Carotid artery stenosis   . CHF (congestive heart failure) ([place]) 2002  . COPD (chronic obstructive pulmonary disease) ([place])   . Coronary artery disease    non obstructive  . Diabetes mellitus   . Dyslipidemia   . History of tobacco abuse   . Nephrolithiasis   . Tremors of nervous system     Patient Active Problem List   Diagnosis Date Noted  . TIA (transient ischemic attack) 07/24/2018  . History of  left-sided carotid endarterectomy 02/27/2015  . Carotid artery stenosis 02/27/2015  . Encounter for postoperative carotid endarterectomy surveillance 02/27/2015  . Aftercare following surgery of the circulatory system, [place] 08/10/2012  . Skin lesion 07/02/2011  . MITRAL REGURGITATION 05/03/2009  . CARDIOMYOPATHY 05/03/2009  . CAROTID ARTERY DISEASE 05/02/2009  . ATRIAL FIBRILLATION 08/29/2008    Past Surgical History:  Procedure Laterality Date  . CAROTID ENDARTERECTOMY Left 10-21-05   cea  . left carotid endarterectomy    . TONSILLECTOMY      Allergies Penicillins and Statins  Family History  Problem Relation Age of Onset  . Coronary artery disease Mother   . Diabetes Mother   . Heart disease Mother        Before age 72 and  CHF  . Hypertension Mother   . Other Mother        varicose veins  . Heart attack Mother   . Heart disease Sister   . Hypertension Sister   . Heart attack Sister   . Heart disease Brother   . Hyperlipidemia Brother   . Hypertension Brother   . Other Brother        varicose veins  . Heart attack Brother   . Heart disease Son 9       Heart Disease before age 48- Open hear surgery  . Deep  vein thrombosis Son   . Heart attack Son   . Coronary artery disease Other   . Diabetes Daughter   . Hypertension Daughter   . Other Daughter        varicose veins    Social History Social History   Tobacco Use  . Smoking status: Former Smoker    Types: Cigarettes    Last attempt to quit: 02/25/2000    Years since quitting: 18.4  . Smokeless tobacco: Former Network engineer Use Topics  . Alcohol use: No  . Drug use: No    Review of Systems  Constitutional: No fever/chills Eyes: No visual changes. ENT: No sore throat. Cardiovascular: Denies chest pain. Respiratory: Denies shortness of breath. Gastrointestinal: No abdominal pain.  No nausea, no vomiting.  No diarrhea.  No constipation. Genitourinary: Negative for dysuria. Musculoskeletal:  Negative for back pain. Skin: Negative for rash. Neurological: Negative for headaches. Positive weakness and difficulty walking with slurred speech.   10-point ROS otherwise negative.  ____________________________________________   PHYSICAL EXAM:  VITAL SIGNS: Vitals:   07/24/18 1446  BP: 124/81  Pulse: 81  Resp: 15  SpO2: 96%    Constitutional: Alert and oriented. Well appearing and in no acute distress. Eyes: Conjunctivae are normal. PERRL. EOMI. Head: Atraumatic. Nose: No congestion/rhinnorhea. Mouth/Throat: Mucous membranes are moist.  Neck: No stridor.  Cardiovascular: Normal rate, regular rhythm. Good peripheral circulation. Grossly normal heart sounds.   Respiratory: Normal respiratory effort.  No retractions. Lungs CTAB. Gastrointestinal: Soft and nontender. No distention.  Musculoskeletal: No lower extremity tenderness nor edema. No gross deformities of extremities. Neurologic:  Normal speech and language. No gross focal neurologic deficits are appreciated. No CN deficits 2-12. No pronator drift. Brisk finger-to-nose testing without past pointing.  Skin:  Skin is warm, dry and intact. No rash noted.  ____________________________________________   LABS (all labs ordered are listed, but only abnormal results are displayed)  Labs Reviewed  PROTIME-INR - Abnormal; Notable for the following components:      Result Value   Prothrombin Time 35.5 (*)    INR 3.6 (*)    All other components within normal limits  APTT - Abnormal; Notable for the following components:   aPTT 37 (*)    All other components within normal limits  CBC - Abnormal; Notable for the following components:   Hemoglobin 12.3 (*)    RDW 17.0 (*)    All other components within normal limits  COMPREHENSIVE METABOLIC PANEL - Abnormal; Notable for the following components:   Glucose, Bld 68 (*)    BUN 27 (*)    Alkaline Phosphatase 33 (*)    GFR calc non Af Amer 56 (*)    All other components  within normal limits  SARS CORONAVIRUS 2 (HOSPITAL ORDER, [facility])  ETHANOL  DIFFERENTIAL  RAPID URINE DRUG SCREEN, HOSP PERFORMED  URINALYSIS, ROUTINE W REFLEX MICROSCOPIC   ____________________________________________  EKG   EKG Interpretation  Date/Time:  Saturday Jul 24 2018 14:45:43 EDT Ventricular Rate:  84 PR Interval:    QRS Duration: 97 QT Interval:  395 QTC Calculation: 467 R Axis:   86 Text Interpretation:  atrial fibrillation  Anterior infarct, old Borderline T abnormalities, inferior leads No STEMI Reconfirmed by Nanda Quinton [id number]) on 07/24/2018 2:55:46 PM      ____________________________________________  RADIOLOGY  Ct Angio Head W Or Wo Contrast  Result Date: 07/24/2018 CLINICAL DATA:  Ataxia with stroke suspected EXAM: CT ANGIOGRAPHY HEAD AND NECK TECHNIQUE:  Multidetector CT imaging of the head and neck was performed using the standard protocol during bolus administration of intravenous contrast. Multiplanar CT image reconstructions and MIPs were obtained to evaluate the vascular anatomy. Carotid stenosis measurements (when applicable) are obtained utilizing NASCET criteria, using the distal internal carotid diameter as the denominator. CONTRAST:  87mL OMNIPAQUE IOHEXOL 350 MG/ML SOLN COMPARISON:  Noncontrast head CT earlier today FINDINGS: CTA NECK FINDINGS Aortic arch: Atherosclerotic plaque.  Three vessel branching. Right carotid system: Bulky calcified plaque at the bifurcation with high-grade proximal ICA narrowing difficult to quantify due to degree of calcified plaque blooming. Downstream vessel is smooth and patent. Left carotid system: Mixed density plaque along the common carotid artery. Patulous bifurcation and proximal ICA after endarterectomy. There is narrowing at the presumed proximal anastomosis which measures 40% (narrowed segment compared to the more proximal vessel given the immediate subsequent bifurcation and  postoperative patulous appearance). Vertebral arteries: Proximal subclavian atherosclerosis. Calcified plaque at the left vertebral origin with moderate narrowing. The downstream vessels are smooth and widely patent. Skeleton: Negative Other neck: No emergent finding Upper chest: There are layering pleural effusions. Borderline interlobular septal thickening with airway thickening. There are enlarged mediastinal lymph nodes which are partially covered, measuring 17 mm at the left paratracheal station. Review of the MIP images confirms the above findings CTA HEAD FINDINGS Anterior circulation: Suboptimal bolus density. Calcified plaque along the bilateral carotid siphons without evidence of severe narrowing. Posterior circulation: The vertebral and basilar arteries are smooth and widely patent. Negative for branch occlusion or flow limiting stenosis. Negative for aneurysm. Venous sinuses: Patent Anatomic variants: None significant Delayed phase: Not obtained in the emergent setting Review of the MIP images confirms the above findings IMPRESSION: 1. No emergent finding. 2. Severe atheromatous narrowing at the right ICA origin to the degree that the lumen is not measurable. 3. Left carotid endarterectomy with ~40% stenosis at the proximal anastomosis. 4. Moderate atheromatous narrowing at the left vertebral origin. 5. Upper thoracic adenopathy that is partially covered. Recommend dedicated chest CT after convalescence. 6. Layering pleural effusions on both sides. Electronically Signed   By: Monte Fantasia M.D.   On: 07/24/2018 16:49   Ct Head Wo Contrast  Result Date: 07/24/2018 CLINICAL DATA:  Ataxia EXAM: CT HEAD WITHOUT CONTRAST TECHNIQUE: Contiguous axial images were obtained from the base of the skull through the vertex without intravenous contrast. COMPARISON:  None. FINDINGS: Brain: There is atrophy and chronic small vessel disease changes. No acute intracranial abnormality. Specifically, no hemorrhage,  hydrocephalus, mass lesion, acute infarction, or significant intracranial injury. Vascular: No hyperdense vessel or unexpected calcification. Skull: No acute calvarial abnormality. Sinuses/Orbits: Visualized paranasal sinuses and mastoids clear. Orbital soft tissues unremarkable. Other: None IMPRESSION: Atrophy, chronic microvascular disease. No acute intracranial abnormality. Electronically Signed   By: Rolm Baptise M.D.   On: 07/24/2018 15:17   Ct Angio Neck W And/or Wo Contrast  Result Date: 07/24/2018 CLINICAL DATA:  Ataxia with stroke suspected EXAM: CT ANGIOGRAPHY HEAD AND NECK TECHNIQUE: Multidetector CT imaging of the head and neck was performed using the standard protocol during bolus administration of intravenous contrast. Multiplanar CT image reconstructions and MIPs were obtained to evaluate the vascular anatomy. Carotid stenosis measurements (when applicable) are obtained utilizing NASCET criteria, using the distal internal carotid diameter as the denominator. CONTRAST:  15mL OMNIPAQUE IOHEXOL 350 MG/ML SOLN COMPARISON:  Noncontrast head CT earlier today FINDINGS: CTA NECK FINDINGS Aortic arch: Atherosclerotic plaque.  Three vessel branching. Right carotid system: Bulky calcified plaque  at the bifurcation with high-grade proximal ICA narrowing difficult to quantify due to degree of calcified plaque blooming. Downstream vessel is smooth and patent. Left carotid system: Mixed density plaque along the common carotid artery. Patulous bifurcation and proximal ICA after endarterectomy. There is narrowing at the presumed proximal anastomosis which measures 40% (narrowed segment compared to the more proximal vessel given the immediate subsequent bifurcation and postoperative patulous appearance). Vertebral arteries: Proximal subclavian atherosclerosis. Calcified plaque at the left vertebral origin with moderate narrowing. The downstream vessels are smooth and widely patent. Skeleton: Negative Other neck: No  emergent finding Upper chest: There are layering pleural effusions. Borderline interlobular septal thickening with airway thickening. There are enlarged mediastinal lymph nodes which are partially covered, measuring 17 mm at the left paratracheal station. Review of the MIP images confirms the above findings CTA HEAD FINDINGS Anterior circulation: Suboptimal bolus density. Calcified plaque along the bilateral carotid siphons without evidence of severe narrowing. Posterior circulation: The vertebral and basilar arteries are smooth and widely patent. Negative for branch occlusion or flow limiting stenosis. Negative for aneurysm. Venous sinuses: Patent Anatomic variants: None significant Delayed phase: Not obtained in the emergent setting Review of the MIP images confirms the above findings IMPRESSION: 1. No emergent finding. 2. Severe atheromatous narrowing at the right ICA origin to the degree that the lumen is not measurable. 3. Left carotid endarterectomy with ~40% stenosis at the proximal anastomosis. 4. Moderate atheromatous narrowing at the left vertebral origin. 5. Upper thoracic adenopathy that is partially covered. Recommend dedicated chest CT after convalescence. 6. Layering pleural effusions on both sides. Electronically Signed   By: Monte Fantasia M.D.   On: 07/24/2018 16:49   Dg Chest Portable 1 View  Result Date: 07/24/2018 CLINICAL DATA:  Altered mental status today. EXAM: PORTABLE CHEST 1 VIEW COMPARISON:  Single-view of the chest 07/21/2018. PA and lateral chest 10/17/2005. FINDINGS: Cardiomegaly, pulmonary edema and small bilateral pleural effusions appear unchanged. Atherosclerosis is noted. IMPRESSION: No change in congestive heart failure with associated small bilateral pleural effusions. Electronically Signed   By: Inge Rise M.D.   On: 07/24/2018 16:27    ____________________________________________   PROCEDURES  Procedure(s) performed:   Procedures  None   ____________________________________________   INITIAL IMPRESSION / ASSESSMENT AND PLAN / ED COURSE  Pertinent labs & imaging results that were available during my care of the patient were reviewed by me and considered in my medical decision making (see chart for details).  Patient is to the emergency department with altered mental status, slurred speech, and concern for possible TIA.  Patient does have multiple risk factors for TIA.  His neurologic exam with me is unremarkable.  I did walk him at bedside and he has somewhat hesitant but no clear gait ataxia.  No code stroke.  Spoke with the wife who corroborated the patient's story and timeline.  Plan for CT head, screening labs, tele-neurology evaluation on screen for possible TIA.  03:10 PM  Discussed the case with tele-neurology.  Patient is not a code stroke they will provide on screen evaluation for possible TIA.   CTA reviewed. No emergent findings. Plan for admit. Discussed with tele-neuro.   Discussed patient's case with Hospitalist, Dr. Waldron Labs to request admission. Patient and family (if present) updated with plan. Care transferred to Hospitalist service.  I reviewed all nursing notes, vitals, pertinent old records, EKGs, labs, imaging (as available).  ____________________________________________  FINAL CLINICAL IMPRESSION(S) / ED DIAGNOSES  Final diagnoses:  TIA (transient ischemic attack)  MEDICATIONS GIVEN DURING THIS VISIT:  Medications  hydrALAZINE (APRESOLINE) injection 5 mg (has no administration in time range)  iohexol (OMNIPAQUE) 350 MG/ML injection 75 mL (75 mLs Intravenous Contrast Given 07/24/18 1611)    Note:  This document was prepared using Dragon voice recognition software and may include unintentional dictation errors.  Nanda Quinton, MD Emergency Medicine   Long, [facility], MD 07/24/18 [phone]

## 2018-07-24 NOTE — ED Triage Notes (Signed)
Patient brought in by wife for AMS. She states an hour ago the patient became disoriented and was unable to stand without losing his balance. Patient was unable to remember his numbers for bank pin at the gas station and has been "not like himself" per wife report. Patient oriented x 4.

## 2018-07-24 NOTE — ED Notes (Signed)
Patient being transported to [facility] by [employer] . Report given to floor by previous RN. [facility] arrived to transport patient.

## 2018-07-24 NOTE — Progress Notes (Signed)
Arrived from [place] to rm 3w07. Alert and oriented. Denies any pain. Tele in placed. Ambulatory well to restroom.

## 2018-07-24 NOTE — H&P (Addendum)
TRH H&P   Patient Demographics:    Oscar [name], is a 78 y.o. male  MRN: [id number]   DOB - 07/05/1940  Admit Date - 07/24/2018  Outpatient Primary MD for the patient is Dione Housekeeper, MD  Referring MD/NP/PA: Dr Laverta Baltimore  Patient coming from: Home  Chief Complaint  Patient presents with  . Altered Mental Status      HPI:    Oscar [name]  is a 78 y.o. male, with past medical history of atrial fibrillation, on warfarin, COPD, on room air, CHF, diabetes mellitus, hyperlipidemia, patient presents to ED for evaluation for transient episode of altered mental status and slurred speech and balance difficulty, patient woke up at his usual state of health this morning, he was riding in the car with his family, brief episode where he could not remember his pain #1 he was pain for the gas, as well family noted him to have slurred speech, and he had to lean against the cart to hold himself, patient told his family he did not want to come to ED, patient was noted later to have difficulty walking going to bathroom, was no fever, chills, seizure activity, loss of consciousness noted during these episodes, no prior history of CVA. - In ED patient reports her strength back to baseline, speech is clear, mentation is appropriate awake alert x4, his INR therapeutic at 3.6, he is showing A. fib on telemetry, he a head and neck significant for severe atheromatosis narrowing in the right ICA origin, left Endarterectomy with 40% stenosis at the proximal anastomosis, and layering pleural effusion.    Review of systems:    In addition to the HPI above,  No Fever-chills, improved episode of confusion and slurred speech No Headache, No changes with Vision or hearing, No problems swallowing food or Liquids, No Chest pain, Cough or Shortness of Breath, No Abdominal pain, No Nausea or Vommitting, Bowel movements  are regular, No Blood in stool or Urine, No dysuria, No new skin rashes or bruises, No new joints pains-aches,  Brief episode of unsteady gait No recent weight gain or loss, No polyuria, polydypsia or polyphagia, No significant Mental Stressors.  A full 10 point Review of Systems was done, except as stated above, all other Review of Systems were negative.   With Past History of the following :    Past Medical History:  Diagnosis Date  . Anemia   . Atrial fibrillation ([place])    permanent  . Cardiomyopathy    EF 35% in the past--Improved to 55% echo 2/10. Moderately severe mitral regurgitation in the past, now improved,  . Carotid artery stenosis   . CHF (congestive heart failure) ([place]) 2002  . COPD (chronic obstructive pulmonary disease) ([place])   . Coronary artery disease    non obstructive  . Diabetes mellitus   . Dyslipidemia   . History of tobacco abuse   .  Nephrolithiasis   . Tremors of nervous system       Past Surgical History:  Procedure Laterality Date  . CAROTID ENDARTERECTOMY Left 10-21-05   cea  . left carotid endarterectomy    . TONSILLECTOMY        Social History:     Social History   Tobacco Use  . Smoking status: Former Smoker    Types: Cigarettes    Last attempt to quit: 02/25/2000    Years since quitting: 18.4  . Smokeless tobacco: Former Network engineer Use Topics  . Alcohol use: No     Lives - at home independent  Mobility - independent    Family History :     Family History  Problem Relation Age of Onset  . Coronary artery disease Mother   . Diabetes Mother   . Heart disease Mother        Before age 110 and  CHF  . Hypertension Mother   . Other Mother        varicose veins  . Heart attack Mother   . Heart disease Sister   . Hypertension Sister   . Heart attack Sister   . Heart disease Brother   . Hyperlipidemia Brother   . Hypertension Brother   . Other Brother        varicose veins  . Heart attack Brother   . Heart  disease Son 78       Heart Disease before age 55- Open hear surgery  . Deep vein thrombosis Son   . Heart attack Son   . Coronary artery disease Other   . Diabetes Daughter   . Hypertension Daughter   . Other Daughter        varicose veins     Home Medications:   Prior to Admission medications   Medication Sig Start Date End Date Taking? Authorizing Provider  albuterol (PROAIR HFA) 108 (90 BASE) MCG/ACT inhaler Inhale 2 puffs into the lungs. 02/09/14  Yes [provider]  amLODipine (NORVASC) 2.5 MG tablet Take 1 tablet (2.5 mg total) by mouth daily. 11/17/17  Yes Minus Breeding, MD  carvedilol (COREG) 25 MG tablet TAKE 1 TABLET TWICE A DAY WITH MEALS (DUE FOR OFFICE VISIT WITH DOCTOR [place]) 08/03/17  Yes Minus Breeding, MD  colesevelam [facility]) 625 MG tablet Take 1,250 mg by mouth 3 (three) times daily.    Yes [provider]  digoxin (LANOXIN) 0.125 MG tablet Take 1 tablet (0.125 mg total) by mouth daily. 08/14/17  Yes Minus Breeding, MD  ezetimibe (ZETIA) 10 MG tablet Take 10 mg by mouth daily.     Yes [provider]  fenofibrate 160 MG tablet Take 160 mg by mouth daily.   Yes [provider]  furosemide (LASIX) 20 MG tablet Take 0.5 tablets (10 mg total) by mouth daily. 05/21/16  Yes Minus Breeding, MD  furosemide (LASIX) 40 MG tablet Take 1 tablet (40 mg total) by mouth 2 (two) times daily. 07/21/18  Yes Virgel Manifold, MD  glipiZIDE (GLUCOTROL XL) 10 MG 24 hr tablet TAKE 1 TABLET BY MOUTH TWICE DAILY 05/22/14  Yes [provider]  lisinopril (PRINIVIL,ZESTRIL) 40 MG tablet Take 40 mg by mouth daily.    Yes [provider]  metFORMIN (GLUCOPHAGE-XR) 500 MG 24 hr tablet Take 500 mg by mouth 2 (two) times daily.    Yes [provider]  Multiple Vitamin (MULTIVITAMIN) tablet Take 1 tablet by mouth daily.   Yes [provider]  Omega-3 Fatty Acids (FISH OIL) 1200 MG CAPS Take by mouth 3 (three) times daily.   Yes  [provider]  potassium chloride 20 MEQ TBCR Take 10 mEq by mouth daily. 07/21/18  Yes Virgel Manifold, MD  pravastatin (PRAVACHOL) 40 MG tablet Take 2 tablets by mouth daily. 07/12/18  Yes [provider]  warfarin (COUMADIN) 7.5 MG tablet Take 7.5 mg by mouth daily. Pt takes 1 tab qd on Mon, Wed, Thurs, Sat   Yes [provider]  linagliptin (TRADJENTA) 5 MG TABS tablet Take 5 mg by mouth daily.  05/15/14 07/22/17  [provider]     Allergies:     Allergies  Allergen Reactions  . Penicillins Hives    Blisters on hands  . Statins     Joint and muscle pain and low energy.     Physical Exam:   Vitals  Blood pressure 132/79, pulse 77, resp. rate (!) 22, height 5\' 6"  (1.676 m), weight 76.2 kg, SpO2 100 %.   1. General well-developed male, laying in bed, lying in bed in NAD,    2. Normal affect and insight, Not Suicidal or Homicidal, Awake Alert, Oriented X 3.  3. No F.N deficits, ALL C.Nerves Intact, Strength 5/5 all 4 extremities, Sensation intact all 4 extremities, Plantars down going.  4. Ears and Eyes appear Normal, Conjunctivae clear, PERRLA. Moist Oral Mucosa.  5. Supple Neck, No JVD, No cervical lymphadenopathy appriciated, No Carotid Bruits.  6. Symmetrical Chest wall movement, Good air movement bilaterally, CTAB.  7.  Irregular irregular, No Gallops, Rubs , mild Murmurs, No Parasternal Heave, +1 edema  8. Positive Bowel Sounds, Abdomen Soft, No tenderness, No organomegaly appriciated,No rebound -guarding or rigidity.  9.  No Cyanosis, Normal Skin Turgor, No Skin Rash or Bruise.  10. Good muscle tone,  joints appear normal , no effusions, Normal ROM.  11. No Palpable Lymph Nodes in Neck or Axillae     Data Review:    CBC Recent Labs  Lab 07/21/18 1754 07/24/18 1508  WBC 7.2 8.5  HGB 12.8* 12.3*  HCT 41.8 39.4  PLT 374 386  MCV 87.8 86.6  MCH 26.9 27.0  MCHC 30.6 31.2  RDW 17.5* 17.0*  LYMPHSABS 1.1 1.3  MONOABS  0.7 0.7  EOSABS 0.1 0.1  BASOSABS 0.0 0.0   ------------------------------------------------------------------------------------------------------------------  Chemistries  Recent Labs  Lab 07/21/18 1754 07/24/18 1508  NA 141 137  K 3.8 3.9  CL 102 100  CO2 27 25  GLUCOSE 177* 68*  BUN 25* 27*  CREATININE 1.27* 1.22  CALCIUM 9.8 9.0  AST  --  30  ALT  --  20  ALKPHOS  --  33*  BILITOT  --  0.8   ------------------------------------------------------------------------------------------------------------------ estimated creatinine clearance is 44.3 mL/min (by C-G formula based on SCr of 1.22 mg/dL). ------------------------------------------------------------------------------------------------------------------ No results for input(s): TSH, T4TOTAL, T3FREE, THYROIDAB in the last 72 hours.  Invalid input(s): FREET3  Coagulation profile Recent Labs  Lab 07/24/18 1508  INR 3.6*   ------------------------------------------------------------------------------------------------------------------- No results for input(s): DDIMER in the last 72 hours. -------------------------------------------------------------------------------------------------------------------  Cardiac Enzymes No results for input(s): CKMB, TROPONINI, MYOGLOBIN in the last 168 hours.  Invalid input(s): CK ------------------------------------------------------------------------------------------------------------------    Component Value Date/Time   BNP 2,433.0 (H) 07/21/2018 1754     ---------------------------------------------------------------------------------------------------------------  Urinalysis    Component Value Date/Time   COLORURINE YELLOW 07/24/2018 [place] 07/24/2018 1451   LABSPEC 1.009 07/24/2018 1451   PHURINE 6.0 07/24/2018 1451  GLUCOSEU NEGATIVE 07/24/2018 1451   HGBUR NEGATIVE 07/24/2018 1451   [place] 07/24/2018 [place] 07/24/2018 1451   PROTEINUR NEGATIVE 07/24/2018 1451   NITRITE NEGATIVE 07/24/2018 [place] 07/24/2018 1451    ----------------------------------------------------------------------------------------------------------------   Imaging Results:    Ct Angio Head W Or Wo Contrast  Result Date: 07/24/2018 CLINICAL DATA:  Ataxia with stroke suspected EXAM: CT ANGIOGRAPHY HEAD AND NECK TECHNIQUE: Multidetector CT imaging of the head and neck was performed using the standard protocol during bolus administration of intravenous contrast. Multiplanar CT image reconstructions and MIPs were obtained to evaluate the vascular anatomy. Carotid stenosis measurements (when applicable) are obtained utilizing NASCET criteria, using the distal internal carotid diameter as the denominator. CONTRAST:  58mL OMNIPAQUE IOHEXOL 350 MG/ML SOLN COMPARISON:  Noncontrast head CT earlier today FINDINGS: CTA NECK FINDINGS Aortic arch: Atherosclerotic plaque.  Three vessel branching. Right carotid system: Bulky calcified plaque at the bifurcation with high-grade proximal ICA narrowing difficult to quantify due to degree of calcified plaque blooming. Downstream vessel is smooth and patent. Left carotid system: Mixed density plaque along the common carotid artery. Patulous bifurcation and proximal ICA after endarterectomy. There is narrowing at the presumed proximal anastomosis which measures 40% (narrowed segment compared to the more proximal vessel given the immediate subsequent bifurcation and postoperative patulous appearance). Vertebral arteries: Proximal subclavian atherosclerosis. Calcified plaque at the left vertebral origin with moderate narrowing. The downstream vessels are smooth and widely patent. Skeleton: Negative Other neck: No emergent finding Upper chest: There are layering pleural effusions. Borderline interlobular septal thickening with airway thickening. There are enlarged mediastinal lymph  nodes which are partially covered, measuring 17 mm at the left paratracheal station. Review of the MIP images confirms the above findings CTA HEAD FINDINGS Anterior circulation: Suboptimal bolus density. Calcified plaque along the bilateral carotid siphons without evidence of severe narrowing. Posterior circulation: The vertebral and basilar arteries are smooth and widely patent. Negative for branch occlusion or flow limiting stenosis. Negative for aneurysm. Venous sinuses: Patent Anatomic variants: None significant Delayed phase: Not obtained in the emergent setting Review of the MIP images confirms the above findings IMPRESSION: 1. No emergent finding. 2. Severe atheromatous narrowing at the right ICA origin to the degree that the lumen is not measurable. 3. Left carotid endarterectomy with ~40% stenosis at the proximal anastomosis. 4. Moderate atheromatous narrowing at the left vertebral origin. 5. Upper thoracic adenopathy that is partially covered. Recommend dedicated chest CT after convalescence. 6. Layering pleural effusions on both sides. Electronically Signed   By: Monte Fantasia M.D.   On: 07/24/2018 16:49   Ct Head Wo Contrast  Result Date: 07/24/2018 CLINICAL DATA:  Ataxia EXAM: CT HEAD WITHOUT CONTRAST TECHNIQUE: Contiguous axial images were obtained from the base of the skull through the vertex without intravenous contrast. COMPARISON:  None. FINDINGS: Brain: There is atrophy and chronic small vessel disease changes. No acute intracranial abnormality. Specifically, no hemorrhage, hydrocephalus, mass lesion, acute infarction, or significant intracranial injury. Vascular: No hyperdense vessel or unexpected calcification. Skull: No acute calvarial abnormality. Sinuses/Orbits: Visualized paranasal sinuses and mastoids clear. Orbital soft tissues unremarkable. Other: None IMPRESSION: Atrophy, chronic microvascular disease. No acute intracranial abnormality. Electronically Signed   By: Rolm Baptise M.D.    On: 07/24/2018 15:17   Ct Angio Neck W And/or Wo Contrast  Result Date: 07/24/2018 CLINICAL DATA:  Ataxia with stroke suspected EXAM: CT ANGIOGRAPHY HEAD AND NECK TECHNIQUE: Multidetector CT imaging of the head and neck  was performed using the standard protocol during bolus administration of intravenous contrast. Multiplanar CT image reconstructions and MIPs were obtained to evaluate the vascular anatomy. Carotid stenosis measurements (when applicable) are obtained utilizing NASCET criteria, using the distal internal carotid diameter as the denominator. CONTRAST:  56mL OMNIPAQUE IOHEXOL 350 MG/ML SOLN COMPARISON:  Noncontrast head CT earlier today FINDINGS: CTA NECK FINDINGS Aortic arch: Atherosclerotic plaque.  Three vessel branching. Right carotid system: Bulky calcified plaque at the bifurcation with high-grade proximal ICA narrowing difficult to quantify due to degree of calcified plaque blooming. Downstream vessel is smooth and patent. Left carotid system: Mixed density plaque along the common carotid artery. Patulous bifurcation and proximal ICA after endarterectomy. There is narrowing at the presumed proximal anastomosis which measures 40% (narrowed segment compared to the more proximal vessel given the immediate subsequent bifurcation and postoperative patulous appearance). Vertebral arteries: Proximal subclavian atherosclerosis. Calcified plaque at the left vertebral origin with moderate narrowing. The downstream vessels are smooth and widely patent. Skeleton: Negative Other neck: No emergent finding Upper chest: There are layering pleural effusions. Borderline interlobular septal thickening with airway thickening. There are enlarged mediastinal lymph nodes which are partially covered, measuring 17 mm at the left paratracheal station. Review of the MIP images confirms the above findings CTA HEAD FINDINGS Anterior circulation: Suboptimal bolus density. Calcified plaque along the bilateral carotid  siphons without evidence of severe narrowing. Posterior circulation: The vertebral and basilar arteries are smooth and widely patent. Negative for branch occlusion or flow limiting stenosis. Negative for aneurysm. Venous sinuses: Patent Anatomic variants: None significant Delayed phase: Not obtained in the emergent setting Review of the MIP images confirms the above findings IMPRESSION: 1. No emergent finding. 2. Severe atheromatous narrowing at the right ICA origin to the degree that the lumen is not measurable. 3. Left carotid endarterectomy with ~40% stenosis at the proximal anastomosis. 4. Moderate atheromatous narrowing at the left vertebral origin. 5. Upper thoracic adenopathy that is partially covered. Recommend dedicated chest CT after convalescence. 6. Layering pleural effusions on both sides. Electronically Signed   By: Monte Fantasia M.D.   On: 07/24/2018 16:49   Dg Chest Portable 1 View  Result Date: 07/24/2018 CLINICAL DATA:  Altered mental status today. EXAM: PORTABLE CHEST 1 VIEW COMPARISON:  Single-view of the chest 07/21/2018. PA and lateral chest 10/17/2005. FINDINGS: Cardiomegaly, pulmonary edema and small bilateral pleural effusions appear unchanged. Atherosclerosis is noted. IMPRESSION: No change in congestive heart failure with associated small bilateral pleural effusions. Electronically Signed   By: Inge Rise M.D.   On: 07/24/2018 16:27    My personal review of EKG: Rhythm NSR, Rate  84 /min, QTc 467 , no Acute ST changes   Assessment & Plan:    Active Problems:   ATRIAL FIBRILLATION   History of left-sided carotid endarterectomy   Carotid artery stenosis   TIA (transient ischemic attack)  TIA -Presents with a brief episode of altered mental status, slurred speech, unsteady gait, CT head with no acute findings, CTA head and neck with significant right carotid  artery narrowing, he is with known history of A. fib, already on anticoagulation within therapeutic  range. -Patient will be given full dose aspirin in ED -Pharmacy to continue dosing his warfarin -He will be transferred to [facility] for MRI brain, 2D echo, and official neurology consultation. -Check lipid panel and A1c.  Carotid artery stenosis -CTA head and neck with significant stenosis, already status post endarteriotomy in left carotid, he is with severe stenosis at  right ICA, will give full dose aspirin, will await further recommendation from neurology about further intervention and recommendations  Hypertension -Avoid permissive hypertension, hold all meds, will keep on PRN hydralazine for systolic blood pressure above 210  Hyperlipidemia -will check Lipid panel, continue with home meds  Diabetes mellitus -Hold oral agent, check A1c, continue with insulin sliding scale  Atrial fibrillation -Rate controlled, continue with digoxin for heart rate control, hold Coreg to allow for permissive hypertension, on warfarin, pharmacy to dose    DVT Prophylaxis on warfarin- SCD  AM Labs Ordered, also please review Full Orders  Family Communication: Admission, patients condition and plan of care including tests being ordered have been discussed with the patient  who indicate understanding and agree with the plan and Code Status.  Code Status Full  Likely DC to  Home  Condition GUARDED    Consults called:  Neuro consulted  Admission status: observation   Time spent in minutes : 60 minutes   Phillips Climes M.D on 07/24/2018 at 5:43 PM  Between 7am to 7pm - Pager - [phone]. After 7pm go to [email or web address] - password [facility]  [facility] - Office  [phone]

## 2018-07-24 NOTE — Progress Notes (Signed)
ANTICOAGULATION CONSULT NOTE - Initial Consult  Pharmacy Consult for warfarin Indication: atrial fibrillation  Allergies  Allergen Reactions  . Penicillins Hives    Blisters on hands  . Statins     Joint and muscle pain and low energy.    Patient Measurements: Height: 5\' 6"  (167.6 cm) Weight: 169 lb 12.1 oz (77 kg) IBW/kg (Calculated) : 63.8  Vital Signs: Temp: 97.7 F (36.5 C) (05/30 2110) Temp Source: Oral (05/30 2110) BP: 141/89 (05/30 2110) Pulse Rate: 79 (05/30 2110)  Labs: Recent Labs    07/24/18 1508  HGB 12.3*  HCT 39.4  PLT 386  APTT 37*  LABPROT 35.5*  INR 3.6*  CREATININE 1.22    Estimated Creatinine Clearance: 48 mL/min (by C-G formula based on SCr of 1.22 mg/dL).   Medical History: Past Medical History:  Diagnosis Date  . Anemia   . Atrial fibrillation ([place])    permanent  . Cardiomyopathy    EF 35% in the past--Improved to 55% echo 2/10. Moderately severe mitral regurgitation in the past, now improved,  . Carotid artery stenosis   . CHF (congestive heart failure) ([place]) 2002  . COPD (chronic obstructive pulmonary disease) ([place])   . Coronary artery disease    non obstructive  . Diabetes mellitus   . Dyslipidemia   . History of tobacco abuse   . Nephrolithiasis   . Tremors of nervous system     Medications:  Medications Prior to Admission  Medication Sig Dispense Refill Last Dose  . albuterol (PROAIR HFA) 108 (90 BASE) MCG/ACT inhaler Inhale 2 puffs into the lungs.   07/24/2018 at Unknown time  . amLODipine (NORVASC) 2.5 MG tablet Take 1 tablet (2.5 mg total) by mouth daily. 90 tablet 2 07/24/2018 at Unknown time  . carvedilol (COREG) 25 MG tablet TAKE 1 TABLET TWICE A DAY WITH MEALS (DUE FOR OFFICE VISIT WITH DOCTOR [place]) 180 tablet 3 07/23/2018 at 1900  . colesevelam (WELCHOL) 625 MG tablet Take 1,250 mg by mouth 3 (three) times daily.    07/24/2018 at Unknown time  . digoxin (LANOXIN) 0.125 MG tablet Take 1 tablet (0.125 mg total) by  mouth daily. 90 tablet 3 07/24/2018 at Unknown time  . ezetimibe (ZETIA) 10 MG tablet Take 10 mg by mouth daily.     07/24/2018 at Unknown time  . fenofibrate 160 MG tablet Take 160 mg by mouth daily.   07/24/2018 at Unknown time  . furosemide (LASIX) 20 MG tablet Take 0.5 tablets (10 mg total) by mouth daily. 45 tablet 0 07/24/2018 at Unknown time  . furosemide (LASIX) 40 MG tablet Take 1 tablet (40 mg total) by mouth 2 (two) times daily. 30 tablet 0 07/24/2018 at Unknown time  . glipiZIDE (GLUCOTROL XL) 10 MG 24 hr tablet TAKE 1 TABLET BY MOUTH TWICE DAILY   07/24/2018 at Unknown time  . lisinopril (PRINIVIL,ZESTRIL) 40 MG tablet Take 40 mg by mouth daily.    07/24/2018 at Unknown time  . metFORMIN (GLUCOPHAGE-XR) 500 MG 24 hr tablet Take 500 mg by mouth 2 (two) times daily.    07/24/2018 at Unknown time  . Multiple Vitamin (MULTIVITAMIN) tablet Take 1 tablet by mouth daily.   07/24/2018 at Unknown time  . Omega-3 Fatty Acids (FISH OIL) 1200 MG CAPS Take by mouth 3 (three) times daily.   07/24/2018 at Unknown time  . potassium chloride 20 MEQ TBCR Take 10 mEq by mouth daily. 15 tablet 0 07/24/2018 at Unknown time  . pravastatin (PRAVACHOL) 40  MG tablet Take 2 tablets by mouth daily.   07/24/2018 at Unknown time  . warfarin (COUMADIN) 7.5 MG tablet Take 7.5 mg by mouth daily. Pt takes 1 tab qd on Mon, Wed, Thurs, Sat   Past Week at Unknown time  . linagliptin (TRADJENTA) 5 MG TABS tablet Take 5 mg by mouth daily.    Taking   Scheduled:  . colesevelam  1,250 mg Oral TID  . [START ON 07/25/2018] digoxin  0.125 mg Oral Daily  . [START ON 07/25/2018] ezetimibe  10 mg Oral Daily  . fenofibrate  160 mg Oral Daily  . multivitamin with minerals  1 tablet Oral Daily  . omega-3 acid ethyl esters  2 g Oral BID    Assessment: 78 yo male her with AMS and TIA. He is on warfarin PTA for afib and pharmacy was consulted to dose -INR= 3.6   Home warfarin dose:  7.5mg  MWThSa (last clinic visit was 5//28/20; INR was  2.6)   Goal of Therapy:  INR 2-3 Monitor platelets by anticoagulation protocol: Yes   Plan:  -Hold coumadin -Daily PT/INR  Hildred Laser, PharmD Clinical Pharmacist **Pharmacist phone directory can now be found on [email or web address] ([email or web address]).  Listed under [email or web address].

## 2018-07-24 NOTE — Consult Note (Signed)
Neurology Consultation Reason for Consult: Transient slurred speech Referring Physician: Elgergawy, D  CC: Transient slurred speech  History is obtained from: Patient  HPI: Oscar [name] is a 78 y.o. male with a history of A. fib and cardiomyopathy currently anticoagulated with a therapeutic INR he was in his normal state of health until shortly after 1 PM.  At that time he began having slurred speech and trouble with his balance.  He did not notice any difficulty with his left side, or that he was slurring his speech, however he did notice trouble with his balance and his wife noticed trouble with his speech.  He states that they lasted about 45 minutes to an hour and did not resolve completely until he was in the emergency department.  By the time of assessment by the tele-neurologist he had resolved completely.   LKW: Noon tpa given?: no, rapidly improving symptoms   ROS: A 14 point ROS was performed and is negative except as noted in the HPI.    Past Medical History:  Diagnosis Date  . Anemia   . Atrial fibrillation ([place])    permanent  . Cardiomyopathy    EF 35% in the past--Improved to 55% echo 2/10. Moderately severe mitral regurgitation in the past, now improved,  . Carotid artery stenosis   . CHF (congestive heart failure) ([place]) 2002  . COPD (chronic obstructive pulmonary disease) ([place])   . Coronary artery disease    non obstructive  . Diabetes mellitus   . Dyslipidemia   . History of tobacco abuse   . Nephrolithiasis   . Tremors of nervous system      Family History  Problem Relation Age of Onset  . Coronary artery disease Mother   . Diabetes Mother   . Heart disease Mother        Before age 10 and  CHF  . Hypertension Mother   . Other Mother        varicose veins  . Heart attack Mother   . Heart disease Sister   . Hypertension Sister   . Heart attack Sister   . Heart disease Brother   . Hyperlipidemia Brother   . Hypertension Brother   . Other Brother         varicose veins  . Heart attack Brother   . Heart disease Son 94       Heart Disease before age 14- Open hear surgery  . Deep vein thrombosis Son   . Heart attack Son   . Coronary artery disease Other   . Diabetes Daughter   . Hypertension Daughter   . Other Daughter        varicose veins     Social History:  reports that he quit smoking about 18 years ago. His smoking use included cigarettes. He has quit using smokeless tobacco. He reports that he does not drink alcohol or use drugs.   Exam: Current vital signs: BP 135/84 (BP Location: Left Arm)   Pulse 79   Temp 98 F (36.7 C) (Oral)   Resp 18   Ht 5\' 6"  (1.676 m)   Wt 77 kg   SpO2 96%   BMI 27.40 kg/m  Vital signs in last 24 hours: Temp:  [97.7 F (36.5 C)-98 F (36.7 C)] 98 F (36.7 C) (05/30 2310) Pulse Rate:  [60-125] 79 (05/30 2110) Resp:  [15-33] 18 (05/30 2310) BP: (118-141)/(58-112) 135/84 (05/30 2310) SpO2:  [93 %-100 %] 96 % (05/30 2310) Weight:  [76.2  kg-77 kg] 77 kg (05/30 2110)   Physical Exam  Constitutional: Appears well-developed and well-nourished.  Psych: Affect appropriate to situation Eyes: No scleral injection HENT: No OP obstrucion Head: Normocephalic.  Cardiovascular: Normal rate and regular rhythm.  Respiratory: Effort normal, non-labored breathing GI: Soft.  No distension. There is no tenderness.  Skin: WDI  Neuro: Mental Status: Patient is awake, alert, oriented to person, place, month, year, and situation. Patient is able to give a clear and coherent history. No signs of aphasia or neglect Cranial Nerves: II: Visual Fields are full. Pupils are equal, round, and reactive to light.   III,IV, VI: EOMI without ptosis or diploplia.  V: Facial sensation is symmetric to temperature VII: Facial movement is symmetric.  VIII: hearing is intact to voice X: Uvula elevates symmetrically XI: Shoulder shrug is symmetric. XII: tongue is midline without atrophy or fasciculations.   Motor: Tone is normal. Bulk is normal. 5/5 strength was present in all four extremities.  Sensory: Sensation is symmetric to light touch and temperature in the arms and legs. Cerebellar: FNF and HKS are intact bilaterally  I have reviewed labs in epic and the results pertinent to this consultation are: CMP-unremarkable CBC-unremarkable INR 3.6  I have reviewed the images obtained: CTA-severe ICA stenosis in the right  Impression: 78 year old male with what sounds like transient ischemic attack with slurred speech and disequilibrium.  I do wonder if he had some neglect failing to recognize some left-sided weakness, but this is not definite.  Whether or not, however, this represents symptomatic ICA stenosis, it is severe enough that I feel he likely needs consultation with a vascular surgeon.  Given the calcification, I do think a carotid ultrasound would help to further characterize this.  He has already received a single dose of aspirin.  Recommendations: - HgbA1c, fasting lipid panel - MRI  of the brain without contrast - Frequent neuro checks - Echocardiogram - Carotid dopplers - Prophylactic therapy-anticoagulation - Risk factor modification - Telemetry monitoring - PT consult, OT consult, Speech consult - Stroke team to follow    Roland Rack, MD [facility] [phone]  If 7pm- 7am, please page neurology on call as listed in [place].

## 2018-07-25 ENCOUNTER — Observation Stay (HOSPITAL_COMMUNITY): Payer: Medicare Other

## 2018-07-25 ENCOUNTER — Inpatient Hospital Stay (HOSPITAL_COMMUNITY): Payer: Medicare Other

## 2018-07-25 ENCOUNTER — Observation Stay (HOSPITAL_BASED_OUTPATIENT_CLINIC_OR_DEPARTMENT_OTHER): Payer: Medicare Other

## 2018-07-25 DIAGNOSIS — Z833 Family history of diabetes mellitus: Secondary | ICD-10-CM | POA: Diagnosis not present

## 2018-07-25 DIAGNOSIS — I083 Combined rheumatic disorders of mitral, aortic and tricuspid valves: Secondary | ICD-10-CM | POA: Diagnosis present

## 2018-07-25 DIAGNOSIS — R4781 Slurred speech: Secondary | ICD-10-CM | POA: Diagnosis present

## 2018-07-25 DIAGNOSIS — Z888 Allergy status to other drugs, medicaments and biological substances status: Secondary | ICD-10-CM | POA: Diagnosis not present

## 2018-07-25 DIAGNOSIS — I5023 Acute on chronic systolic (congestive) heart failure: Secondary | ICD-10-CM | POA: Diagnosis not present

## 2018-07-25 DIAGNOSIS — I5021 Acute systolic (congestive) heart failure: Secondary | ICD-10-CM | POA: Diagnosis not present

## 2018-07-25 DIAGNOSIS — E785 Hyperlipidemia, unspecified: Secondary | ICD-10-CM | POA: Diagnosis present

## 2018-07-25 DIAGNOSIS — I429 Cardiomyopathy, unspecified: Secondary | ICD-10-CM | POA: Diagnosis not present

## 2018-07-25 DIAGNOSIS — E1165 Type 2 diabetes mellitus with hyperglycemia: Secondary | ICD-10-CM | POA: Diagnosis not present

## 2018-07-25 DIAGNOSIS — I5043 Acute on chronic combined systolic (congestive) and diastolic (congestive) heart failure: Secondary | ICD-10-CM | POA: Diagnosis not present

## 2018-07-25 DIAGNOSIS — Z20828 Contact with and (suspected) exposure to other viral communicable diseases: Secondary | ICD-10-CM | POA: Diagnosis present

## 2018-07-25 DIAGNOSIS — G459 Transient cerebral ischemic attack, unspecified: Secondary | ICD-10-CM | POA: Diagnosis present

## 2018-07-25 DIAGNOSIS — J449 Chronic obstructive pulmonary disease, unspecified: Secondary | ICD-10-CM | POA: Diagnosis present

## 2018-07-25 DIAGNOSIS — I2510 Atherosclerotic heart disease of native coronary artery without angina pectoris: Secondary | ICD-10-CM | POA: Diagnosis not present

## 2018-07-25 DIAGNOSIS — I340 Nonrheumatic mitral (valve) insufficiency: Secondary | ICD-10-CM | POA: Diagnosis not present

## 2018-07-25 DIAGNOSIS — Z9981 Dependence on supplemental oxygen: Secondary | ICD-10-CM | POA: Diagnosis not present

## 2018-07-25 DIAGNOSIS — I248 Other forms of acute ischemic heart disease: Secondary | ICD-10-CM | POA: Diagnosis not present

## 2018-07-25 DIAGNOSIS — Z8349 Family history of other endocrine, nutritional and metabolic diseases: Secondary | ICD-10-CM | POA: Diagnosis not present

## 2018-07-25 DIAGNOSIS — I4819 Other persistent atrial fibrillation: Secondary | ICD-10-CM | POA: Diagnosis not present

## 2018-07-25 DIAGNOSIS — J9601 Acute respiratory failure with hypoxia: Secondary | ICD-10-CM | POA: Diagnosis not present

## 2018-07-25 DIAGNOSIS — J9621 Acute and chronic respiratory failure with hypoxia: Secondary | ICD-10-CM | POA: Diagnosis not present

## 2018-07-25 DIAGNOSIS — Z8249 Family history of ischemic heart disease and other diseases of the circulatory system: Secondary | ICD-10-CM | POA: Diagnosis not present

## 2018-07-25 DIAGNOSIS — I6521 Occlusion and stenosis of right carotid artery: Secondary | ICD-10-CM

## 2018-07-25 DIAGNOSIS — I371 Nonrheumatic pulmonary valve insufficiency: Secondary | ICD-10-CM | POA: Diagnosis not present

## 2018-07-25 DIAGNOSIS — I482 Chronic atrial fibrillation, unspecified: Secondary | ICD-10-CM | POA: Diagnosis not present

## 2018-07-25 DIAGNOSIS — E1151 Type 2 diabetes mellitus with diabetic peripheral angiopathy without gangrene: Secondary | ICD-10-CM | POA: Diagnosis present

## 2018-07-25 DIAGNOSIS — E876 Hypokalemia: Secondary | ICD-10-CM | POA: Diagnosis not present

## 2018-07-25 DIAGNOSIS — I651 Occlusion and stenosis of basilar artery: Secondary | ICD-10-CM | POA: Diagnosis not present

## 2018-07-25 DIAGNOSIS — I5020 Unspecified systolic (congestive) heart failure: Secondary | ICD-10-CM | POA: Diagnosis not present

## 2018-07-25 DIAGNOSIS — Z880 Allergy status to penicillin: Secondary | ICD-10-CM | POA: Diagnosis not present

## 2018-07-25 DIAGNOSIS — I5022 Chronic systolic (congestive) heart failure: Secondary | ICD-10-CM | POA: Diagnosis not present

## 2018-07-25 DIAGNOSIS — I4821 Permanent atrial fibrillation: Secondary | ICD-10-CM

## 2018-07-25 DIAGNOSIS — I110 Hypertensive heart disease with heart failure: Secondary | ICD-10-CM | POA: Diagnosis present

## 2018-07-25 DIAGNOSIS — Z87891 Personal history of nicotine dependence: Secondary | ICD-10-CM | POA: Diagnosis not present

## 2018-07-25 DIAGNOSIS — Z9889 Other specified postprocedural states: Secondary | ICD-10-CM | POA: Diagnosis not present

## 2018-07-25 DIAGNOSIS — Z7901 Long term (current) use of anticoagulants: Secondary | ICD-10-CM | POA: Diagnosis not present

## 2018-07-25 DIAGNOSIS — R570 Cardiogenic shock: Secondary | ICD-10-CM | POA: Diagnosis not present

## 2018-07-25 DIAGNOSIS — Z8673 Personal history of transient ischemic attack (TIA), and cerebral infarction without residual deficits: Secondary | ICD-10-CM | POA: Diagnosis not present

## 2018-07-25 DIAGNOSIS — R4182 Altered mental status, unspecified: Secondary | ICD-10-CM | POA: Diagnosis present

## 2018-07-25 DIAGNOSIS — Z7984 Long term (current) use of oral hypoglycemic drugs: Secondary | ICD-10-CM | POA: Diagnosis not present

## 2018-07-25 LAB — PROTIME-INR
INR: 2.8 — ABNORMAL HIGH (ref 0.8–1.2)
Prothrombin Time: 29.1 seconds — ABNORMAL HIGH (ref 11.4–15.2)

## 2018-07-25 LAB — GLUCOSE, CAPILLARY
Glucose-Capillary: 80 mg/dL (ref 70–99)
Glucose-Capillary: 174 mg/dL — ABNORMAL HIGH (ref 70–99)
Glucose-Capillary: 213 mg/dL — ABNORMAL HIGH (ref 70–99)

## 2018-07-25 LAB — LIPID PANEL
Cholesterol: 95 mg/dL (ref 0–200)
HDL: 17 mg/dL — ABNORMAL LOW (ref >40)
LDL Cholesterol: 55 mg/dL (ref 0–99)
Total CHOL/HDL Ratio: 5.6 RATIO
Triglycerides: 113 mg/dL (ref <150)
VLDL: 23 mg/dL (ref 0–40)

## 2018-07-25 LAB — ECHOCARDIOGRAM COMPLETE
Height: 66 in
Weight: 2716.07 oz

## 2018-07-25 LAB — HEMOGLOBIN A1C
Hgb A1c MFr Bld: 6.9 % — ABNORMAL HIGH (ref 4.8–5.6)
Mean Plasma Glucose: 151.33 mg/dL

## 2018-07-25 MED ORDER — WARFARIN SODIUM 2.5 MG PO TABS
2.5000 mg | ORAL_TABLET | Freq: Once | ORAL | Status: AC
  Administered 2018-07-25: 2.5 mg via ORAL
  Filled 2018-07-25: qty 1

## 2018-07-25 MED ORDER — PERFLUTREN LIPID MICROSPHERE
1.0000 mL | INTRAVENOUS | Status: AC | PRN
  Administered 2018-07-25: 10:00:00 2 mL via INTRAVENOUS
  Filled 2018-07-25: qty 10

## 2018-07-25 MED ORDER — INSULIN ASPART 100 UNIT/ML ~~LOC~~ SOLN
0.0000 [IU] | Freq: Three times a day (TID) | SUBCUTANEOUS | Status: DC
  Administered 2018-07-25 – 2018-07-26 (×2): 2 [IU] via SUBCUTANEOUS
  Administered 2018-07-26 (×2): 1 [IU] via SUBCUTANEOUS
  Administered 2018-07-27 (×2): 3 [IU] via SUBCUTANEOUS
  Administered 2018-07-27: 1 [IU] via SUBCUTANEOUS
  Administered 2018-07-28: 07:00:00 2 [IU] via SUBCUTANEOUS

## 2018-07-25 MED ORDER — WARFARIN - PHARMACIST DOSING INPATIENT: Freq: Every day | Status: DC

## 2018-07-25 MED ORDER — ASPIRIN EC 81 MG PO TBEC
81.0000 mg | DELAYED_RELEASE_TABLET | Freq: Every day | ORAL | Status: DC
  Administered 2018-07-25 – 2018-08-02 (×9): 81 mg via ORAL
  Filled 2018-07-25 (×9): qty 1

## 2018-07-25 MED ORDER — PRAVASTATIN SODIUM 40 MG PO TABS
80.0000 mg | ORAL_TABLET | Freq: Every day | ORAL | Status: DC
  Administered 2018-07-25 – 2018-08-02 (×9): 80 mg via ORAL
  Filled 2018-07-25 (×10): qty 2

## 2018-07-25 MED ORDER — CALCIUM CARBONATE ANTACID 500 MG PO CHEW
400.0000 mg | CHEWABLE_TABLET | Freq: Three times a day (TID) | ORAL | Status: DC | PRN
  Administered 2018-07-25: 400 mg via ORAL
  Filled 2018-07-25: qty 2

## 2018-07-25 NOTE — Progress Notes (Signed)
OT Cancellation Note  Patient Details Name: Oscar [name] MRN: [id number] DOB: Oct 13, 1940   Cancelled Treatment:    Reason Eval/Treat Not Completed: Active bedrest order. Will follow and initiate OT eval when medically appropriate and as able.   Delight Stare, OT Acute Rehabilitation Services Pager [phone] Office [phone]    Delight Stare 07/25/2018, 6:59 AM

## 2018-07-25 NOTE — Progress Notes (Addendum)
PROGRESS NOTE    Oscar [name]  QIH:[id number] DOB: 1940-03-26 DOA: 07/24/2018 PCP: Dione Housekeeper, MD   Brief Narrative:  On 07/24/2018 by Dr. Emeline Gins Elgergawy [name] a78 y.o.male,with past medical history of atrial fibrillation, on warfarin, COPD, on room air, CHF, diabetes mellitus, hyperlipidemia, patient presents to ED for evaluation for transient episode of altered mental status and slurred speech and balance difficulty, patient woke up at his usual state of health this morning, he was riding in the car with his family, brief episode where he could not remember his pain #1 he was pain for the gas, as well family noted him to have slurred speech, and he had to lean against the cart to hold himself, patient told his family he did not want to come to ED, patient was noted later to have difficulty walking going to bathroom, was no fever, chills, seizure activity, loss of consciousness noted during these episodes, no prior history of CVA. - In EDpatient reports her strength back to baseline, speech is clear, mentation is appropriate awake alert x4, his INR therapeutic at 3.6, he is showing A. fib on telemetry, he a head and neck significant for severe atheromatosis narrowing in the right ICA origin, left Endarterectomy with 40% stenosis at the proximal anastomosis, and layering pleural effusion.  Assessment & Plan   TIA -Patient presented with a brief episode of altered mental status, slurred speech, unsteady gait -CT head with no acute findings -CTA head and neck with significant right carotid artery narrowing -MRI showed no acute or subacute finding -Carotid Doppler: Bilateral ICA 1 to 39% stenosis bilateral vertebral arteries demonstrate antegrade flow -History of atrial fibrillation on anticoagulation with Coumadin -Echocardiogram pending  -LDL 55, Hemoglobin A1c 6.9 -Neurology consulted and appreciated, recommended vascular consultation  -Continue Coumadin -Pending PT  assessment  Carotid artery stenosis -CTA head and neck showed significant stenosis R ICA -History of left carotid endarterectomy -Vascular surgery consulted and appreciated -Currently on coumadin and aspirin  Essential hypertension -Coreg, lisinopril, and lasix held -BP stable  Hyperlipidemia -Takes Zetia, fenofibrate, welchol, statin  -LDL 55  Diabetes mellitus, type II -Placed on ISS and CBG montioring  -on glipizide, tradjenta, metformin at home  Atrial fibrillation -Currently rate controlled -Coreg held to left permissive hypertension -Continue digoxin, Coumadin  Upper thoracic adenopathy -found on CTA head/neck -CT chest recommended- will order  DVT Prophylaxis  coumadin  Code Status: Full  Family Communication: None at bedside  Disposition Plan:   Consultants Neurology Vascular Surgery  Procedures  Carotid doppler Echocardiogram   Antibiotics   Anti-infectives (From admission, onward)   None      Subjective:   [name] seen and examined today.  Patient states he is feeling better now would like to go home today.  Denies current chest pain, shortness of breath, abdominal pain, nausea vomiting, diarrhea constipation, dizziness or headache.  Feels he is got more control over his balance today.  Denies any further speech issues.  Objective:   Vitals:   07/25/18 0310 07/25/18 0510 07/25/18 0717 07/25/18 1130  BP: 135/78 125/78 126/79 135/83  Pulse: 69 68 77 80  Resp: 15 15 15 16   Temp: 98.1 F (36.7 C) 98.1 F (36.7 C) 98 F (36.7 C) 98 F (36.7 C)  TempSrc: Oral Oral Axillary Oral  SpO2: 94% 97% 94% 93%  Weight:      Height:        Intake/Output Summary (Last 24 hours) at 07/25/2018 1355 Last data filed at 07/25/2018 0400  Gross per 24 hour  Intake -  Output 50 ml  Net -50 ml   Filed Weights   07/24/18 1441 07/24/18 2110  Weight: 76.2 kg 77 kg    Exam  General: Well developed, well nourished, NAD, appears stated  age  29: NCAT, mucous membranes moist.   Neck: Supple  Cardiovascular: S1 S2 auscultated, IRR, soft SEM  Respiratory: Clear to auscultation bilaterally   Abdomen: Soft, nontender, nondistended, + bowel sounds  Extremities: warm dry without cyanosis clubbing  Neuro: AAOx3, nonfocal  Skin: Without rashes exudates or nodules  Psych: Normal affect and demeanor, pleasant    Data Reviewed: I have personally reviewed following labs and imaging studies  CBC: Recent Labs  Lab 07/21/18 1754 07/24/18 1508  WBC 7.2 8.5  NEUTROABS 5.2 6.3  HGB 12.8* 12.3*  HCT 41.8 39.4  MCV 87.8 86.6  PLT 374 601   Basic Metabolic Panel: Recent Labs  Lab 07/21/18 1754 07/24/18 1508  NA 141 137  K 3.8 3.9  CL 102 100  CO2 27 25  GLUCOSE 177* 68*  BUN 25* 27*  CREATININE 1.27* 1.22  CALCIUM 9.8 9.0   GFR: Estimated Creatinine Clearance: 48 mL/min (by C-G formula based on SCr of 1.22 mg/dL). Liver Function Tests: Recent Labs  Lab 07/24/18 1508  AST 30  ALT 20  ALKPHOS 33*  BILITOT 0.8  PROT 6.7  ALBUMIN 3.8   No results for input(s): LIPASE, AMYLASE in the last 168 hours. No results for input(s): AMMONIA in the last 168 hours. Coagulation Profile: Recent Labs  Lab 07/24/18 1508 07/25/18 0443  INR 3.6* 2.8*   Cardiac Enzymes: No results for input(s): CKTOTAL, CKMB, CKMBINDEX, TROPONINI in the last 168 hours. BNP (last 3 results) No results for input(s): PROBNP in the last 8760 hours. HbA1C: Recent Labs    07/25/18 0443  HGBA1C 6.9*   CBG: Recent Labs  Lab 07/25/18 0719  GLUCAP 80   Lipid Profile: Recent Labs    07/25/18 0443  CHOL 95  HDL 17*  LDLCALC 55  TRIG 113  CHOLHDL 5.6   Thyroid Function Tests: No results for input(s): TSH, T4TOTAL, FREET4, T3FREE, THYROIDAB in the last 72 hours. Anemia Panel: No results for input(s): VITAMINB12, FOLATE, FERRITIN, TIBC, IRON, RETICCTPCT in the last 72 hours. Urine analysis:    Component Value Date/Time    COLORURINE YELLOW 07/24/2018 [place] 07/24/2018 1451   LABSPEC 1.009 07/24/2018 1451   PHURINE 6.0 07/24/2018 1451   GLUCOSEU NEGATIVE 07/24/2018 1451   [place] 07/24/2018 [place] 07/24/2018 [place] 07/24/2018 1451   PROTEINUR NEGATIVE 07/24/2018 1451   NITRITE NEGATIVE 07/24/2018 [place] 07/24/2018 1451   Sepsis Labs: @LABRCNTIP (procalcitonin:4,lacticidven:4)  ) Recent Results (from the past 240 hour(s))  SARS Coronavirus 2 (CEPHEID - Performed in [facility] lab), Hosp Order     Status: None   Collection Time: 07/24/18  4:48 PM  Result Value Ref Range Status   SARS Coronavirus 2 NEGATIVE NEGATIVE Final    Comment: (NOTE) If result is NEGATIVE SARS-CoV-2 target nucleic acids are NOT DETECTED. The SARS-CoV-2 RNA is generally detectable in upper and lower  respiratory specimens during the acute phase of infection. The lowest  concentration of SARS-CoV-2 viral copies this assay can detect is 250  copies / mL. A negative result does not preclude SARS-CoV-2 infection  and should not be used as the sole basis for treatment or other  patient management decisions.  A negative result may occur with  improper specimen collection / handling, submission of specimen other  than nasopharyngeal swab, presence of viral mutation(s) within the  areas targeted by this assay, and inadequate number of viral copies  (<250 copies / mL). A negative result must be combined with clinical  observations, patient history, and epidemiological information. If result is POSITIVE SARS-CoV-2 target nucleic acids are DETECTED. The SARS-CoV-2 RNA is generally detectable in upper and lower  respiratory specimens dur ing the acute phase of infection.  Positive  results are indicative of active infection with SARS-CoV-2.  Clinical  correlation with patient history and other diagnostic information is  necessary to  determine patient infection status.  Positive results do  not rule out bacterial infection or co-infection with other viruses. If result is PRESUMPTIVE POSTIVE SARS-CoV-2 nucleic acids MAY BE PRESENT.   A presumptive positive result was obtained on the submitted specimen  and confirmed on repeat testing.  While 2019 novel coronavirus  (SARS-CoV-2) nucleic acids may be present in the submitted sample  additional confirmatory testing may be necessary for epidemiological  and / or clinical management purposes  to differentiate between  SARS-CoV-2 and other Sarbecovirus currently known to infect humans.  If clinically indicated additional testing with an alternate test  methodology [phone]) is advised. The SARS-CoV-2 RNA is generally  detectable in upper and lower respiratory sp ecimens during the acute  phase of infection. The expected result is Negative. Fact Sheet for Patients:  StrictlyIdeas.no Fact Sheet for Healthcare Providers: BankingDealers.co.za This test is not yet approved or cleared by the [place] FDA and has been authorized for detection and/or diagnosis of SARS-CoV-2 by FDA under an Emergency Use Authorization (EUA).  This EUA will remain in effect (meaning this test can be used) for the duration of the COVID-19 declaration under Section 564(b)(1) of the Act, 21 U.S.C. section 360bbb-3(b)(1), unless the authorization is terminated or revoked sooner. Performed at [facility], [address] [place], [place] [address]       Radiology Studies: Ct Angio Head W Or Wo Contrast  Result Date: 07/24/2018 CLINICAL DATA:  Ataxia with stroke suspected EXAM: CT ANGIOGRAPHY HEAD AND NECK TECHNIQUE: Multidetector CT imaging of the head and neck was performed using the standard protocol during bolus administration of intravenous contrast. Multiplanar CT image reconstructions and MIPs were obtained to evaluate the vascular anatomy.  Carotid stenosis measurements (when applicable) are obtained utilizing NASCET criteria, using the distal internal carotid diameter as the denominator. CONTRAST:  60mL OMNIPAQUE IOHEXOL 350 MG/ML SOLN COMPARISON:  Noncontrast head CT earlier today FINDINGS: CTA NECK FINDINGS Aortic arch: Atherosclerotic plaque.  Three vessel branching. Right carotid system: Bulky calcified plaque at the bifurcation with high-grade proximal ICA narrowing difficult to quantify due to degree of calcified plaque blooming. Downstream vessel is smooth and patent. Left carotid system: Mixed density plaque along the common carotid artery. Patulous bifurcation and proximal ICA after endarterectomy. There is narrowing at the presumed proximal anastomosis which measures 40% (narrowed segment compared to the more proximal vessel given the immediate subsequent bifurcation and postoperative patulous appearance). Vertebral arteries: Proximal subclavian atherosclerosis. Calcified plaque at the left vertebral origin with moderate narrowing. The downstream vessels are smooth and widely patent. Skeleton: Negative Other neck: No emergent finding Upper chest: There are layering pleural effusions. Borderline interlobular septal thickening with airway thickening. There are enlarged mediastinal lymph nodes which are partially covered, measuring 17 mm at the left paratracheal station. Review of the  MIP images confirms the above findings CTA HEAD FINDINGS Anterior circulation: Suboptimal bolus density. Calcified plaque along the bilateral carotid siphons without evidence of severe narrowing. Posterior circulation: The vertebral and basilar arteries are smooth and widely patent. Negative for branch occlusion or flow limiting stenosis. Negative for aneurysm. Venous sinuses: Patent Anatomic variants: None significant Delayed phase: Not obtained in the emergent setting Review of the MIP images confirms the above findings IMPRESSION: 1. No emergent finding. 2.  Severe atheromatous narrowing at the right ICA origin to the degree that the lumen is not measurable. 3. Left carotid endarterectomy with ~40% stenosis at the proximal anastomosis. 4. Moderate atheromatous narrowing at the left vertebral origin. 5. Upper thoracic adenopathy that is partially covered. Recommend dedicated chest CT after convalescence. 6. Layering pleural effusions on both sides. Electronically Signed   By: Monte Fantasia M.D.   On: 07/24/2018 16:49   Ct Head Wo Contrast  Result Date: 07/24/2018 CLINICAL DATA:  Ataxia EXAM: CT HEAD WITHOUT CONTRAST TECHNIQUE: Contiguous axial images were obtained from the base of the skull through the vertex without intravenous contrast. COMPARISON:  None. FINDINGS: Brain: There is atrophy and chronic small vessel disease changes. No acute intracranial abnormality. Specifically, no hemorrhage, hydrocephalus, mass lesion, acute infarction, or significant intracranial injury. Vascular: No hyperdense vessel or unexpected calcification. Skull: No acute calvarial abnormality. Sinuses/Orbits: Visualized paranasal sinuses and mastoids clear. Orbital soft tissues unremarkable. Other: None IMPRESSION: Atrophy, chronic microvascular disease. No acute intracranial abnormality. Electronically Signed   By: Rolm Baptise M.D.   On: 07/24/2018 15:17   Ct Angio Neck W And/or Wo Contrast  Result Date: 07/24/2018 CLINICAL DATA:  Ataxia with stroke suspected EXAM: CT ANGIOGRAPHY HEAD AND NECK TECHNIQUE: Multidetector CT imaging of the head and neck was performed using the standard protocol during bolus administration of intravenous contrast. Multiplanar CT image reconstructions and MIPs were obtained to evaluate the vascular anatomy. Carotid stenosis measurements (when applicable) are obtained utilizing NASCET criteria, using the distal internal carotid diameter as the denominator. CONTRAST:  48mL OMNIPAQUE IOHEXOL 350 MG/ML SOLN COMPARISON:  Noncontrast head CT earlier today  FINDINGS: CTA NECK FINDINGS Aortic arch: Atherosclerotic plaque.  Three vessel branching. Right carotid system: Bulky calcified plaque at the bifurcation with high-grade proximal ICA narrowing difficult to quantify due to degree of calcified plaque blooming. Downstream vessel is smooth and patent. Left carotid system: Mixed density plaque along the common carotid artery. Patulous bifurcation and proximal ICA after endarterectomy. There is narrowing at the presumed proximal anastomosis which measures 40% (narrowed segment compared to the more proximal vessel given the immediate subsequent bifurcation and postoperative patulous appearance). Vertebral arteries: Proximal subclavian atherosclerosis. Calcified plaque at the left vertebral origin with moderate narrowing. The downstream vessels are smooth and widely patent. Skeleton: Negative Other neck: No emergent finding Upper chest: There are layering pleural effusions. Borderline interlobular septal thickening with airway thickening. There are enlarged mediastinal lymph nodes which are partially covered, measuring 17 mm at the left paratracheal station. Review of the MIP images confirms the above findings CTA HEAD FINDINGS Anterior circulation: Suboptimal bolus density. Calcified plaque along the bilateral carotid siphons without evidence of severe narrowing. Posterior circulation: The vertebral and basilar arteries are smooth and widely patent. Negative for branch occlusion or flow limiting stenosis. Negative for aneurysm. Venous sinuses: Patent Anatomic variants: None significant Delayed phase: Not obtained in the emergent setting Review of the MIP images confirms the above findings IMPRESSION: 1. No emergent finding. 2. Severe atheromatous narrowing at the right  ICA origin to the degree that the lumen is not measurable. 3. Left carotid endarterectomy with ~40% stenosis at the proximal anastomosis. 4. Moderate atheromatous narrowing at the left vertebral origin. 5.  Upper thoracic adenopathy that is partially covered. Recommend dedicated chest CT after convalescence. 6. Layering pleural effusions on both sides. Electronically Signed   By: Monte Fantasia M.D.   On: 07/24/2018 16:49   Mr Brain Wo Contrast  Result Date: 07/25/2018 CLINICAL DATA:  Acute neurological of an yesterday with altered mental status, slurred speech and balance disturbance. EXAM: MRI HEAD WITHOUT CONTRAST TECHNIQUE: Multiplanar, multiecho pulse sequences of the brain and surrounding structures were obtained without intravenous contrast. COMPARISON:  CT studies done yesterday. FINDINGS: Brain: Diffusion imaging does not show any acute or subacute infarction. No abnormality is seen affecting the pons or cerebellum. There are mild to moderate chronic small-vessel ischemic changes of the cerebral hemispheric deep and subcortical white matter. No large vessel territory infarction. No mass lesion, hemorrhage, hydrocephalus or extra-axial collection. Vascular: Major vessels at the base of the brain show flow. Skull and upper cervical spine: Negative. Benign appearing heterogeneity of the clivus. Sinuses/Orbits: Clear/normal Other: None IMPRESSION: No acute or subacute finding by MRI. Mild to moderate chronic small-vessel ischemic changes of the cerebral hemispheric white matter. Electronically Signed   By: Nelson Chimes M.D.   On: 07/25/2018 08:23   Dg Chest Portable 1 View  Result Date: 07/24/2018 CLINICAL DATA:  Altered mental status today. EXAM: PORTABLE CHEST 1 VIEW COMPARISON:  Single-view of the chest 07/21/2018. PA and lateral chest 10/17/2005. FINDINGS: Cardiomegaly, pulmonary edema and small bilateral pleural effusions appear unchanged. Atherosclerosis is noted. IMPRESSION: No change in congestive heart failure with associated small bilateral pleural effusions. Electronically Signed   By: Inge Rise M.D.   On: 07/24/2018 16:27   Vas US Carotid  Result Date: 07/25/2018 Carotid Arterial  Duplex Study Indications:       TIA. Risk Factors:      Diabetes, coronary artery disease. Comparison Study:  Previous study: 02/09/2014 Performing Technologist: Abram Sander RVS  Examination Guidelines: A complete evaluation includes B-mode imaging, spectral Doppler, color Doppler, and power Doppler as needed of all accessible portions of each vessel. Bilateral testing is considered an integral part of a complete examination. Limited examinations for reoccurring indications may be performed as noted.  Right Carotid Findings: +----------+--------+--------+--------+----------------------+--------+           PSV cm/sEDV cm/sStenosisDescribe              Comments +----------+--------+--------+--------+----------------------+--------+ CCA Prox  38      7               calcific and irregular         +----------+--------+--------+--------+----------------------+--------+ CCA Distal39      9               calcific and irregular         +----------+--------+--------+--------+----------------------+--------+ ICA Prox  316     62      60-79%  calcific and irregular         +----------+--------+--------+--------+----------------------+--------+ ICA Mid   72      11                                             +----------+--------+--------+--------+----------------------+--------+ ICA Distal62      22                                             +----------+--------+--------+--------+----------------------+--------+  ECA       97      12                                             +----------+--------+--------+--------+----------------------+--------+ +----------+--------+-------+--------+-------------------+           PSV cm/sEDV cmsDescribeArm Pressure (mmHG) +----------+--------+-------+--------+-------------------+ [id number]                                         +----------+--------+-------+--------+-------------------+  +---------+--------+--+--------+--+---------+ VertebralPSV cm/s49EDV cm/s11Antegrade +---------+--------+--+--------+--+---------+  Left Carotid Findings: +----------+--------+--------+--------+-------------------------+--------+           PSV cm/sEDV cm/sStenosisDescribe                 Comments +----------+--------+--------+--------+-------------------------+--------+ CCA Prox  47      7               heterogenous                      +----------+--------+--------+--------+-------------------------+--------+ CCA Distal111     9               heterogenous and calcific         +----------+--------+--------+--------+-------------------------+--------+ ICA Prox  56      17      1-39%   heterogenous                      +----------+--------+--------+--------+-------------------------+--------+ ICA Distal64      21                                                +----------+--------+--------+--------+-------------------------+--------+ ECA       137                                                       +----------+--------+--------+--------+-------------------------+--------+ +----------+--------+--------+--------+-------------------+ SubclavianPSV cm/sEDV cm/sDescribeArm Pressure (mmHG) +----------+--------+--------+--------+-------------------+           62                                          +----------+--------+--------+--------+-------------------+ +---------+--------+--+--------+-+---------+ VertebralPSV cm/s36EDV cm/s7Antegrade +---------+--------+--+--------+-+---------+  Summary: Right Carotid: Velocities in the right ICA are consistent with a 60-79%                stenosis. 60-79% stenosis based off plaque morphology. Left Carotid: Velocities in the left ICA are consistent with a 1-39% stenosis. Vertebrals: Bilateral vertebral arteries demonstrate antegrade flow. *See table(s) above for measurements and observations.     Preliminary       Scheduled Meds: . aspirin EC  81 mg Oral Daily  . colesevelam  1,250 mg Oral TID  . digoxin  0.125 mg Oral Daily  . ezetimibe  10 mg Oral Daily  . fenofibrate  160 mg Oral Daily  . insulin aspart  0-9 Units Subcutaneous TID WC  . multivitamin with minerals  1 tablet Oral  Daily  . omega-3 acid ethyl esters  2 g Oral BID  . warfarin  2.5 mg Oral ONCE-1800  . Warfarin - Pharmacist Dosing Inpatient   Does not apply q1800   Continuous Infusions:   LOS: 0 days   Time Spent in minutes   45 minutes  Ismerai Bin D.O. on 07/25/2018 at 1:55 PM  Between 7am to 7pm - Please see pager noted on [email or web address]  After 7pm go to [email or web address]  And look for the night coverage person covering for me after hours  [facility] Office  [phone]

## 2018-07-25 NOTE — Progress Notes (Signed)
Physical Therapy Treatment Patient Details Name: Oscar [name] MRN: [id number] DOB: 06/28/1940 Today's Date: 07/25/2018    History of Present Illness Pt is a 78 y/o male presenting to ED for evaluation of transient episode of AMS, slurred speech, balance dificulty, lasting approx 1 hr then resolving in ED. CT with no acute abnormalities, MRI pending. PMH: anemia, afib, CHF, COPD, DM, tremors of the nervous system, L carotid endarterectomy.     PT Comments    Pt admitted with above diagnosis. Pt currently with functional limitations due to the deficits listed below (see PT Problem List). PTA pt independent with mobility. Today, near baseline ambulating unit without difficulty, reports symptoms have resolved. Pt tentative with surgery for carotid stenosis, will follow along, do not ancipitate follow up recs. Next visit DGI and stairs.  Pt will benefit from skilled PT to increase their independence and safety with mobility to allow discharge to the venue listed below.      Follow Up Recommendations  No PT follow up     Equipment Recommendations  None recommended by PT    Recommendations for Other Services       Precautions / Restrictions Restrictions Weight Bearing Restrictions: No    Mobility  Bed Mobility Overal bed mobility: Modified Independent             General bed mobility comments: no assist required, HOB elevated  Transfers Overall transfer level: Modified independent                  Ambulation/Gait Ambulation/Gait assistance: Modified independent (Device/Increase time) Gait Distance (Feet): 200 Feet Assistive device: None Gait Pattern/deviations: Step-to pattern Gait velocity: decreased   General Gait Details: no overt LOB, mild unsteadiness, will do DGI next viist   Stairs             Wheelchair Mobility    Modified Rankin (Stroke Patients Only)       Balance Overall balance assessment: Mild deficits observed, not formally  tested                                          Cognition Arousal/Alertness: Awake/alert Behavior During Therapy: WFL for tasks assessed/performed Overall Cognitive Status: Within Functional Limits for tasks assessed                                 General Comments: able follow multiple step commands and sequence with increased time       Exercises      General Comments        Pertinent Vitals/Pain      Home Living Family/patient expects to be discharged to:: Private residence Living Arrangements: Spouse/significant other Available Help at Discharge: Family Type of Home: House Home Access: Stairs to enter Entrance Stairs-Rails: (+ rail) Home Layout: Two level(lives on the 1st floor) Home Equipment: Cane - single point      Prior Function Level of Independence: Independent      Comments: drives, manages medication, independent ADLs   PT Goals (current goals can now be found in the care plan section) Acute Rehab PT Goals Patient Stated Goal: to go home today PT Goal Formulation: With patient Potential to Achieve Goals: Good    Frequency    Min 3X/week      PT Plan      Co-evaluation  AM-PAC PT "6 Clicks" Mobility   Outcome Measure  Help needed turning from your back to your side while in a flat bed without using bedrails?: None Help needed moving from lying on your back to sitting on the side of a flat bed without using bedrails?: None Help needed moving to and from a bed to a chair (including a wheelchair)?: None Help needed standing up from a chair using your arms (e.g., wheelchair or bedside chair)?: None Help needed to walk in hospital room?: None Help needed climbing 3-5 steps with a railing? : A Little 6 Click Score: 23    End of Session Equipment Utilized During Treatment: Gait belt Activity Tolerance: Patient tolerated treatment well Patient left: in bed;with call bell/phone within reach;with  nursing/sitter in room Nurse Communication: Mobility status PT Visit Diagnosis: Unsteadiness on feet (R26.81)     Time: 0626-9485 PT Time Calculation (min) (ACUTE ONLY): 25 min  Charges:  $Gait Training: 8-22 mins                    Reinaldo Berber, PT, DPT Acute Rehabilitation Services Pager: [phone] Office: [place] 07/25/2018, 5:03 PM

## 2018-07-25 NOTE — Progress Notes (Signed)
  Echocardiogram 2D Echocardiogram has been performed.  Jennette Dubin 07/25/2018, 10:47 AM

## 2018-07-25 NOTE — Progress Notes (Signed)
Carotid duplex has been completed.   Preliminary results in CV Proc.   Abram Sander 07/25/2018 10:33 AM

## 2018-07-25 NOTE — Evaluation (Addendum)
Occupational Therapy Evaluation Patient Details Name: Oscar [name] MRN: [id number] DOB: 1941/02/16 Today's Date: 07/25/2018    History of Present Illness Pt is a 78 y/o male presenting to ED for evaluation of transient episode of AMS, slurred speech, balance dificulty, lasting approx 1 hr then resolving in ED. CT with no acute abnormalities, MRI pending. PMH: anemia, afib, CHF, COPD, DM, tremors of the nervous system, L carotid endarterectomy.    Clinical Impression   PTA independent, driving and managing his medication. Admitted for above and limited by problem list below, including mild unsteadiness and decreased activity tolerance.  Patient able to complete transfers with min guard initially for balance fading to supervision, grooming standing at sink with supervision, LB ADLs with min guard and UB ADLs with supervision.  Patient will benefit from continued OT services while admitted but anticipate he will progress well with no further needs after dc. Will follow acutely in order to maximize independence and safety with ADLs.     Follow Up Recommendations  No OT follow up;Supervision - Intermittent    Equipment Recommendations  3 in 1 bedside commode    Recommendations for Other Services       Precautions / Restrictions Restrictions Weight Bearing Restrictions: No      Mobility Bed Mobility Overal bed mobility: Modified Independent             General bed mobility comments: no assist required, HOB elevated  Transfers Overall transfer level: Needs assistance Equipment used: None Transfers: Sit to/from Stand Sit to Stand: Min guard;Supervision         General transfer comment: min guard for initial standing balance, fading to supervision    Balance Overall balance assessment: Mild deficits observed, not formally tested                                         ADL either performed or assessed with clinical judgement   ADL Overall ADL's : Needs  assistance/impaired     Grooming: Standing;Supervision/safety   Upper Body Bathing: Supervision/ safety;Sitting   Lower Body Bathing: Min guard;Sit to/from stand   Upper Body Dressing : Set up   Lower Body Dressing: Min guard;Sit to/from stand Lower Body Dressing Details (indicate cue type and reason): able to manage socks with supervision seated, min guard sit<>stand  Toilet Transfer: Min guard;Ambulation           Functional mobility during ADLs: Min guard General ADL Comments: pt limited by decreased act tolerance, slight unsteadiness with mobility      Vision Baseline Vision/History: Cataracts Patient Visual Report: No change from baseline Vision Assessment?: No apparent visual deficits     Perception     Praxis      Pertinent Vitals/Pain Pain Assessment: No/denies pain     Hand Dominance Right   Extremity/Trunk Assessment Upper Extremity Assessment Upper Extremity Assessment: Overall WFL for tasks assessed   Lower Extremity Assessment Lower Extremity Assessment: Defer to PT evaluation       Communication Communication Communication: No difficulties   Cognition Arousal/Alertness: Awake/alert Behavior During Therapy: WFL for tasks assessed/performed Overall Cognitive Status: Within Functional Limits for tasks assessed                                 General Comments: able follow multiple step commands and sequence with increased  time    General Comments  RR up to 32 during activity but oxgyen saturation >98 on RA, otherwise VSS    Exercises     Shoulder Instructions      Home Living Family/patient expects to be discharged to:: Private residence Living Arrangements: Spouse/significant other Available Help at Discharge: Family Type of Home: House Home Access: Stairs to enter Technical brewer of Steps: 4 Entrance Stairs-Rails: (+ rail) Home Layout: Two level(lives on the 1st floor)     Bathroom Shower/Tub: Arts administrator: Standard     Home Equipment: Cane - single point          Prior Functioning/Environment Level of Independence: Independent        Comments: drives, manages medication, independent ADLs        OT Problem List: Decreased activity tolerance;Impaired balance (sitting and/or standing);Decreased safety awareness;Decreased knowledge of use of DME or AE;Decreased knowledge of precautions      OT Treatment/Interventions: Self-care/ADL training;Therapeutic exercise;DME and/or AE instruction;Therapeutic activities;Patient/family education;Balance training    OT Goals(Current goals can be found in the care plan section) Acute Rehab OT Goals Patient Stated Goal: to go home today OT Goal Formulation: With patient Time For Goal Achievement: 08/08/18 Potential to Achieve Goals: Good  OT Frequency: Min 2X/week   Barriers to D/C:            Co-evaluation              AM-PAC OT "6 Clicks" Daily Activity     Outcome Measure Help from another person eating meals?: None Help from another person taking care of personal grooming?: A Little Help from another person toileting, which includes using toliet, bedpan, or urinal?: A Little Help from another person bathing (including washing, rinsing, drying)?: A Little Help from another person to put on and taking off regular upper body clothing?: None Help from another person to put on and taking off regular lower body clothing?: A Little 6 Click Score: 20   End of Session Equipment Utilized During Treatment: Gait belt Nurse Communication: Mobility status  Activity Tolerance: Patient tolerated treatment well Patient left: in bed;with call bell/phone within reach;with bed alarm set  OT Visit Diagnosis: Unsteadiness on feet (R26.81)                Time: 4765-4650 OT Time Calculation (min): 17 min Charges:  OT General Charges $OT Visit: 1 Visit OT Evaluation $OT Eval Low Complexity: 1 Low  Delight Stare,  OT Acute Rehabilitation Services Pager [phone] Office [phone]   Delight Stare 07/25/2018, 8:58 AM

## 2018-07-25 NOTE — Progress Notes (Signed)
STROKE TEAM PROGRESS NOTE   SUBJECTIVE (INTERVAL HISTORY) No family is at the bedside.  Pt is back to baseline now. No complains. He had left CEA in the past with Dr. Donnetta Hutching. He is on coumadin with INR 2.8. will have vascular surgery consult for consideration of right CEA  OBJECTIVE Vitals:   07/25/18 0310 07/25/18 0510 07/25/18 0717 07/25/18 1130  BP: 135/78 125/78 126/79 135/83  Pulse: 69 68 77 80  Resp: 15 15 15 16   Temp: 98.1 F (36.7 C) 98.1 F (36.7 C) 98 F (36.7 C) 98 F (36.7 C)  TempSrc: Oral Oral Axillary Oral  SpO2: 94% 97% 94% 93%  Weight:      Height:        CBC:  Recent Labs  Lab 07/21/18 1754 07/24/18 1508  WBC 7.2 8.5  NEUTROABS 5.2 6.3  HGB 12.8* 12.3*  HCT 41.8 39.4  MCV 87.8 86.6  PLT 374 947    Basic Metabolic Panel:  Recent Labs  Lab 07/21/18 1754 07/24/18 1508  NA 141 137  K 3.8 3.9  CL 102 100  CO2 27 25  GLUCOSE 177* 68*  BUN 25* 27*  CREATININE 1.27* 1.22  CALCIUM 9.8 9.0    Lipid Panel:     Component Value Date/Time   CHOL 95 07/25/2018 0443   TRIG 113 07/25/2018 0443   HDL 17 (L) 07/25/2018 0443   CHOLHDL 5.6 07/25/2018 0443   VLDL 23 07/25/2018 0443   LDLCALC 55 07/25/2018 0443   HgbA1c:  Lab Results  Component Value Date   HGBA1C 6.9 (H) 07/25/2018   Urine Drug Screen:     Component Value Date/Time   LABOPIA NONE DETECTED 07/24/2018 1451   COCAINSCRNUR NONE DETECTED 07/24/2018 1451   LABBENZ NONE DETECTED 07/24/2018 1451   AMPHETMU NONE DETECTED 07/24/2018 1451   THCU NONE DETECTED 07/24/2018 1451   LABBARB NONE DETECTED 07/24/2018 1451    Alcohol Level     Component Value Date/Time   ETH <10 07/24/2018 1508    IMAGING  Ct Angio Head W Or Wo Contrast Ct Angio Neck W And/or Wo Contrast 07/24/2018 IMPRESSION:  1. No emergent finding.  2. Severe atheromatous narrowing at the right ICA origin to the degree that the lumen is not measurable.  3. Left carotid endarterectomy with ~40% stenosis at the proximal  anastomosis.  4. Moderate atheromatous narrowing at the left vertebral origin.  5. Upper thoracic adenopathy that is partially covered. Recommend dedicated chest CT after convalescence.  6. Layering pleural effusions on both sides.   Ct Head Wo Contrast 07/24/2018 IMPRESSION:  Atrophy, chronic microvascular disease. No acute intracranial abnormality.   Mr Brain Wo Contrast 07/25/2018 IMPRESSION:  No acute or subacute finding by MRI. Mild to moderate chronic small-vessel ischemic changes of the cerebral hemispheric white matter.   Dg Chest Portable 1 View 07/24/2018 IMPRESSION:  No change in congestive heart failure with associated small bilateral pleural effusions.   Vas US Carotid  07/25/2018 Summary:  Right Carotid: Velocities in the right ICA are consistent with a 40-59% stenosis.  Left Carotid: Velocities in the left ICA are consistent with a 1-39% stenosis.  Vertebrals: Bilateral vertebral arteries demonstrate antegrade flow.   Transthoracic Echocardiogram  00/00/2020 Pending  EKG - atrial fibrillation - ventricular response 84 BPM (See cardiology reading for complete details)    PHYSICAL EXAM  Temp:  [97.7 F (36.5 C)-98.1 F (36.7 C)] 98 F (36.7 C) (05/31 1130) Pulse Rate:  [60-125] 80 (05/31 1130) Resp:  [  15-33] 16 (05/31 1130) BP: (118-141)/(58-112) 135/83 (05/31 1130) SpO2:  [93 %-100 %] 93 % (05/31 1130) Weight:  [76.2 kg-77 kg] 77 kg (05/30 2110)  General - Well nourished, well developed, in no apparent distress.  Ophthalmologic - fundi not visualized due to noncooperation.  Cardiovascular - irregularly irregular heart rate and rhythm.  Mental Status -  Level of arousal and orientation to time, place, and person were intact. Language including expression, naming, repetition, comprehension was assessed and found intact. Fund of Knowledge was assessed and was intact.  Cranial Nerves II - XII - II - Visual field intact OU. III, IV, VI - Extraocular  movements intact. V - Facial sensation intact bilaterally. VII - Facial movement intact bilaterally. VIII - Hearing & vestibular intact bilaterally. X - Palate elevates symmetrically. XI - Chin turning & shoulder shrug intact bilaterally. XII - Tongue protrusion intact.  Motor Strength - The patient's strength was normal in all extremities and pronator drift was absent.  Bulk was normal and fasciculations were absent.   Motor Tone - Muscle tone was assessed at the neck and appendages and was normal.  Reflexes - The patient's reflexes were symmetrical in all extremities and he had no pathological reflexes.  Sensory - Light touch, temperature/pinprick were assessed and were symmetrical.    Coordination - The patient had normal movements in the hands and feet with no ataxia or dysmetria.  Tremor was absent.  Gait and Station - deferred.   ASSESSMENT/PLAN Mr. GOBLE FUDALA is a 78 y.o. male with history of carotid artery disease, CEA, CHF, COPD, CAD, DM, Hld, A. fib and cardiomyopathy currently anticoagulated with a therapeutic INR presenting with transient slurred speech and trouble with his balance that resolved completely. He did not receive IV t-PA due to resolution of deficits and anticoagulation.  TIA - can not rule out related to right ICA high grade stenosis    Resultant  Back to baseline  CT head - Atrophy, chronic microvascular disease. No acute intracranial abnormality.   MRI head - No acute or subacute finding by MRI.   CTA H&N - Severe atheromatous narrowing at the right ICA origin to the degree that the lumen is not measurable.   Carotid Doppler  - right ICA 40-59% stenosis  2D Echo - pending  Sars Corona Virus 2  - negative  LDL - 55  HgbA1c - 6.9  UDS  - negative  VTE prophylaxis - warfarin  Diet - Heart healthy / carb modified with thin liquids.  warfarin daily prior to admission, now on warfarin daily. Recommend to add ASA 81 on top of coumadin due to  carotid atherosclerosis.   Patient counseled to be compliant with his antithrombotic medications  Ongoing aggressive stroke risk factor management  Therapy recommendations:  none  Disposition:  Pending  Carotid stenosis  Left CEA done with Dr. Donnetta Hutching in the past  CTA neck showed right ICA/CCA high grade stenosis and left ICA 40% stenosis  CUS right ICA 40-59% stenosis  Recommend VVS consult to consider right CEA  Recommend lovenox bridge to coumadin if appropriate perioperatively.   BP goal 130-150 prior to procedure  Afib, chronic  On coumadin PTA  INR on admission 2.8  Rate controlled  Continue coumadin   Perioperatively, recommend lovenox bridge for coumadin if appropriate.  Hypertension  Stable . Long-term BP goal 130-150 given carotid stenosis  Hyperlipidemia  Lipid lowering medication PTA: Pravachol 80 mg daily.  LDL 55, goal < 70  Current lipid  lowering medication: Zetia 10 mg daily, Fenofibrate 160 mg daily and pravastatin 80 mg daily  Continue statin at discharge  Diabetes  HgbA1c 6.9, goal < 7.0  Controlled  SSI  CBG monitoring  Other Stroke Risk Factors  Advanced age  Former cigarette smoker - quit 18 years ago.  Coronary artery disease  Other Active Problems  Upper thoracic adenopathy that is partially covered. Recommend dedicated chest CT after convalescence.    Hospital day # 0  Rosalin Hawking, MD PhD Stroke Neurology 07/25/2018 2:00 PM    To contact Stroke Continuity provider, please refer to [email or web address] After hours, contact General Neurology

## 2018-07-25 NOTE — Progress Notes (Signed)
[facility] for warfarin Indication: atrial fibrillation  Allergies  Allergen Reactions  . Penicillins Hives    Blisters on hands  . Statins     Joint and muscle pain and low energy.    Patient Measurements: Height: 5\' 6"  (167.6 cm) Weight: 169 lb 12.1 oz (77 kg) IBW/kg (Calculated) : 63.8  Vital Signs: Temp: 98.1 F (36.7 C) (05/31 0510) Temp Source: Oral (05/31 0510) BP: 125/78 (05/31 0510) Pulse Rate: 68 (05/31 0510)  Labs: Recent Labs    07/24/18 1508 07/25/18 0443  HGB 12.3*  --   HCT 39.4  --   PLT 386  --   APTT 37*  --   LABPROT 35.5* 29.1*  INR 3.6* 2.8*  CREATININE 1.22  --     Estimated Creatinine Clearance: 48 mL/min (by C-G formula based on SCr of 1.22 mg/dL).   Assessment: 78 yo male her with AMS and TIA. He is on warfarin PTA for afib and pharmacy was consulted to dose -INR= 2.8 today  Home warfarin dose:  7.5mg  MWThSa (last clinic visit was 5//28/20; INR was 2.6)   Goal of Therapy:  INR 2-3 Monitor platelets by anticoagulation protocol: Yes   Plan:  -Warfarin 2.5 mg po x 1 -Daily PT/INR  Thank you Anette Guarneri, PharmD Clinical Pharmacist **Pharmacist phone directory can now be found on [email or web address] ([email or web address]).  Listed under [place].

## 2018-07-25 NOTE — Progress Notes (Signed)
Patient has personal wallet at bedside. Stated he did not want it to be locked up out of his room. He placed it in a patient belonging bag and placed it in a bag with his clothes. This nurse placed it in the closet. Patient also has a watch but did not want to take it off. Nurse will continue to monitor. [place]

## 2018-07-25 NOTE — Consult Note (Signed)
Hospital Consult    Reason for Consult:  High grade right carotid stenosis Referring Physician:  Hospitalist/Neurology MRN #:  [id number]  History of Present Illness: This is a 78 y.o. male with history of atrial fibrillation on Coumadin, CHF, carotid stenosis status post left carotid endarterectomy by Dr.Early in 2007, COPD, coronary artery disease, diabetes, hyperlipidemia that vascular surgery has been consulted for high-grade right carotid stenosis.  Patient reports he was on the [place] getting gasoline on Wednesday or Thursday of last week when he had difficulty with his balance and his wife reported a short episode of slurred speech.  Patient denies any focal numbness or weakness in one particular arm or leg.  He thought his imbalance was equal on both sides.  Apparently his slurred speech resolved by the time he presented to [facility].  He ultimately underwent MRI that showed no evidence of acute stroke.  He also has had carotid duplex and CTA neck that shows his left carotid endarterectomy is patent.  He has had progression of his right carotid disease and CTA confirms greater than 80% calcified stenosis.  He is on Coumadin and his INR is therapeutic at this time.  In discussing with patient he denies any prior episodes of slurred speech or other focal symptoms including weakness numbness tingling in arm or leg or vision loss.  He does endorse some shortness of breath over the last 6 months.  States he is able to walk around his yard and mow the grass as well as put air in his tires.  Denies tobacco abuse.  No recurrent episodes since hospitalization.  No neurologic deficits today per patient.  Past Medical History:  Diagnosis Date  . Anemia   . Atrial fibrillation ([place])    permanent  . Cardiomyopathy    EF 35% in the past--Improved to 55% echo 2/10. Moderately severe mitral regurgitation in the past, now improved,  . Carotid artery stenosis   . CHF (congestive heart failure) ([place])  2002  . COPD (chronic obstructive pulmonary disease) ([place])   . Coronary artery disease    non obstructive  . Diabetes mellitus   . Dyslipidemia   . History of tobacco abuse   . Nephrolithiasis   . Tremors of nervous system     Past Surgical History:  Procedure Laterality Date  . CAROTID ENDARTERECTOMY Left 10-21-05   cea  . left carotid endarterectomy    . TONSILLECTOMY      Allergies  Allergen Reactions  . Penicillins Hives    Blisters on hands  . Statins     Joint and muscle pain and low energy.    Prior to Admission medications   Medication Sig Start Date End Date Taking? Authorizing Provider  albuterol (PROAIR HFA) 108 (90 BASE) MCG/ACT inhaler Inhale 2 puffs into the lungs. 02/09/14  Yes [provider]  amLODipine (NORVASC) 2.5 MG tablet Take 1 tablet (2.5 mg total) by mouth daily. 11/17/17  Yes Minus Breeding, MD  carvedilol (COREG) 25 MG tablet TAKE 1 TABLET TWICE A DAY WITH MEALS (DUE FOR OFFICE VISIT WITH DOCTOR [facility]) 08/03/17  Yes Minus Breeding, MD  colesevelam [facility]) 625 MG tablet Take 1,250 mg by mouth 3 (three) times daily.    Yes [provider]  digoxin (LANOXIN) 0.125 MG tablet Take 1 tablet (0.125 mg total) by mouth daily. 08/14/17  Yes Minus Breeding, MD  ezetimibe (ZETIA) 10 MG tablet Take 10 mg by mouth daily.     Yes [provider]  fenofibrate 160 MG tablet Take 160 mg by mouth daily.   Yes [provider]  furosemide (LASIX) 20 MG tablet Take 0.5 tablets (10 mg total) by mouth daily. 05/21/16  Yes Minus Breeding, MD  furosemide (LASIX) 40 MG tablet Take 1 tablet (40 mg total) by mouth 2 (two) times daily. 07/21/18  Yes Virgel Manifold, MD  glipiZIDE (GLUCOTROL XL) 10 MG 24 hr tablet TAKE 1 TABLET BY MOUTH TWICE DAILY 05/22/14  Yes [provider]  lisinopril (PRINIVIL,ZESTRIL) 40 MG tablet Take 40 mg by mouth daily.    Yes [provider]  metFORMIN (GLUCOPHAGE-XR) 500 MG 24 hr tablet Take 500  mg by mouth 2 (two) times daily.    Yes [provider]  Multiple Vitamin (MULTIVITAMIN) tablet Take 1 tablet by mouth daily.   Yes [provider]  Omega-3 Fatty Acids (FISH OIL) 1200 MG CAPS Take by mouth 3 (three) times daily.   Yes [provider]  potassium chloride 20 MEQ TBCR Take 10 mEq by mouth daily. 07/21/18  Yes Virgel Manifold, MD  pravastatin (PRAVACHOL) 40 MG tablet Take 2 tablets by mouth daily. 07/12/18  Yes [provider]  warfarin (COUMADIN) 7.5 MG tablet Take 7.5 mg by mouth daily. Pt takes 1 tab qd on Mon, Wed, Thurs, Sat   Yes [provider]  linagliptin (TRADJENTA) 5 MG TABS tablet Take 5 mg by mouth daily.  05/15/14 07/22/17  [provider]    Social History   Socioeconomic History  . Marital status: Married    Spouse name: Not on file  . Number of children: 5  . Years of education: 41  . Highest education level: Not on file  Occupational History  . Occupation: Retired  Scientific laboratory technician  . Financial resource strain: Not on file  . Food insecurity:    Worry: Not on file    Inability: Not on file  . Transportation needs:    Medical: Not on file    Non-medical: Not on file  Tobacco Use  . Smoking status: Former Smoker    Types: Cigarettes    Last attempt to quit: 02/25/2000    Years since quitting: 18.4  . Smokeless tobacco: Former Network engineer and Sexual Activity  . Alcohol use: No  . Drug use: No  . Sexual activity: Not on file  Lifestyle  . Physical activity:    Days per week: Not on file    Minutes per session: Not on file  . Stress: Not on file  Relationships  . Social connections:    Talks on phone: Not on file    Gets together: Not on file    Attends religious service: Not on file    Active member of club or organization: Not on file    Attends meetings of clubs or organizations: Not on file    Relationship status: Not on file  . Intimate partner violence:    Fear of current or ex partner:  Not on file    Emotionally abused: Not on file    Physically abused: Not on file    Forced sexual activity: Not on file  Other Topics Concern  . Not on file  Social History Narrative   Lives at home with his wife.   Right-handed.   3-4 cups caffeine per day.     Family History  Problem Relation Age of Onset  . Coronary artery disease Mother   . Diabetes Mother   . Heart disease Mother  Before age 38 and  CHF  . Hypertension Mother   . Other Mother        varicose veins  . Heart attack Mother   . Heart disease Sister   . Hypertension Sister   . Heart attack Sister   . Heart disease Brother   . Hyperlipidemia Brother   . Hypertension Brother   . Other Brother        varicose veins  . Heart attack Brother   . Heart disease Son 52       Heart Disease before age 21- Open hear surgery  . Deep vein thrombosis Son   . Heart attack Son   . Coronary artery disease Other   . Diabetes Daughter   . Hypertension Daughter   . Other Daughter        varicose veins    ROS: [x]  Positive   [ ]  Negative   [ ]  All sytems reviewed and are negative  Cardiovascular: []  chest pain/pressure []  palpitations []  SOB lying flat [x]  DOE []  pain in legs while walking []  pain in legs at rest []  pain in legs at night []  non-healing ulcers []  hx of DVT []  swelling in legs  Pulmonary: []  productive cough []  asthma/wheezing []  home O2  Neurologic: []  weakness in []  arms []  legs []  numbness in []  arms []  legs []  hx of CVA []  mini stroke [x] difficulty speaking or slurred speech []  temporary loss of vision in one eye []  dizziness  Hematologic: []  hx of cancer []  bleeding problems []  problems with blood clotting easily  Endocrine:   []  diabetes []  thyroid disease  GI []  vomiting blood []  blood in stool  GU: []  CKD/renal failure []  HD--[]  M/W/F or []  T/T/S []  burning with urination []  blood in urine  Psychiatric: []  anxiety []  depression  Musculoskeletal: []   arthritis []  joint pain  Integumentary: []  rashes []  ulcers  Constitutional: []  fever []  chills   Physical Examination  Vitals:   07/25/18 0717 07/25/18 1130  BP: 126/79 135/83  Pulse: 77 80  Resp: 15 16  Temp: 98 F (36.7 C) 98 F (36.7 C)  SpO2: 94% 93%   Body mass index is 27.4 kg/m.  General:  NAD, pleasant Gait: Not observed HENT: WNL, normocephalic Pulmonary: normal non-labored breathing, without Rales, rhonchi,  wheezing Cardiac: regular, without  Murmurs, rubs or gallops Abdomen: soft, NT/ND, no masses Skin: without rashes Vascular Exam/Pulses:  Right Left  Radial 2+ (normal) 2+ (normal)  Ulnar    Femoral 2+ (normal) 2+ (normal)  Popliteal    DP 1+ (weak) 1+ (weak)  PT     Extremities: without ischemic changes, without Gangrene , without cellulitis Musculoskeletal: no muscle wasting or atrophy  Neurologic: A&O X 3; Appropriate Affect ; SENSATION: normal; MOTOR FUNCTION:  moving all extremities equally. Speech is fluent/normal.  CN II-XII grossly intact.   CBC    Component Value Date/Time   WBC 8.5 07/24/2018 1508   RBC 4.55 07/24/2018 1508   HGB 12.3 (L) 07/24/2018 1508   HCT 39.4 07/24/2018 1508   PLT 386 07/24/2018 1508   MCV 86.6 07/24/2018 1508   MCH 27.0 07/24/2018 1508   MCHC 31.2 07/24/2018 1508   RDW 17.0 (H) 07/24/2018 1508   LYMPHSABS 1.3 07/24/2018 1508   MONOABS 0.7 07/24/2018 1508   EOSABS 0.1 07/24/2018 1508   BASOSABS 0.0 07/24/2018 1508    BMET    Component Value Date/Time   NA 137 07/24/2018 1508  K 3.9 07/24/2018 1508   CL 100 07/24/2018 1508   CO2 25 07/24/2018 1508   GLUCOSE 68 (L) 07/24/2018 1508   BUN 27 (H) 07/24/2018 1508   CREATININE 1.22 07/24/2018 1508   CALCIUM 9.0 07/24/2018 1508   GFRNONAA 56 (L) 07/24/2018 1508   GFRAA >60 07/24/2018 1508    COAGS: Lab Results  Component Value Date   INR 2.8 (H) 07/25/2018   INR 3.6 (H) 07/24/2018   INR 1.7 08/29/2008   PROTIME 14.5 08/01/2008      Non-Invasive Vascular Imaging:    Carotid duplex 07/25/18: R ICA stenosis 60-69%, PSV 316; L ICA 1-39%  I independently reviewed CTA neck from yesterday and agree that it shows greater than 80% stenosis of the proximal right ICA with severe calcified lesion.  The left carotid endarterectomy site appears widely patent and he does have some disease in the common carotid proximal to the patch but this does not appear flow-limiting and is not hemodynamically significant on [place].   ASSESSMENT/PLAN: 78 year old male with multiple medical problems as noted above that vascular surgery has been asked to evaluate high-grade right carotid stenosis and possible carotid intervention.  Certainly difficult to exclude TIA given his presentation of slurred speech but otherwise all of his other symptoms were nonfocal.  In review of his CTA neck he has had progression of disease and now has high-grade right ICA stenosis with a very calcified lesion (certainly >62%).  I think he would benefit from carotid intervention and has been followed by Dr. early in the past after previous left carotid endarterectomy in 2007.  The one other caveat is his echocardiogram shows severely reduced EF of now 25 to 30% with only the base of the ventricle moving (echo last year showed EF 65-70%).  Discussed with Dr. Ree Kida that it would probably be reasonable to have cardiology see him tomorrow for perioperative risk assessment and optimization.  Will let Dr. early know that he is here and Dr. early will see him tomorrow and make a final decision moving forward given that he has continuity and has previously done his left carotid.  Marty Heck, MD [facility] Office: [phone] Pager: [place]

## 2018-07-26 ENCOUNTER — Inpatient Hospital Stay (HOSPITAL_COMMUNITY): Payer: Medicare Other

## 2018-07-26 ENCOUNTER — Other Ambulatory Visit: Payer: Self-pay

## 2018-07-26 DIAGNOSIS — I5020 Unspecified systolic (congestive) heart failure: Secondary | ICD-10-CM

## 2018-07-26 DIAGNOSIS — I482 Chronic atrial fibrillation, unspecified: Secondary | ICD-10-CM

## 2018-07-26 DIAGNOSIS — I429 Cardiomyopathy, unspecified: Secondary | ICD-10-CM

## 2018-07-26 LAB — BASIC METABOLIC PANEL
Anion gap: 15 (ref 5–15)
BUN: 25 mg/dL — ABNORMAL HIGH (ref 8–23)
CO2: 21 mmol/L — ABNORMAL LOW (ref 22–32)
Calcium: 9.1 mg/dL (ref 8.9–10.3)
Chloride: 100 mmol/L (ref 98–111)
Creatinine, Ser: 1.23 mg/dL (ref 0.61–1.24)
GFR calc Af Amer: >60 mL/min (ref >60)
GFR calc non Af Amer: 55 mL/min — ABNORMAL LOW (ref >60)
Glucose, Bld: 153 mg/dL — ABNORMAL HIGH (ref 70–99)
Potassium: 3.8 mmol/L (ref 3.5–5.1)
Sodium: 136 mmol/L (ref 135–145)

## 2018-07-26 LAB — GLUCOSE, CAPILLARY
Glucose-Capillary: 172 mg/dL — ABNORMAL HIGH (ref 70–99)
Glucose-Capillary: 147 mg/dL — ABNORMAL HIGH (ref 70–99)
Glucose-Capillary: 127 mg/dL — ABNORMAL HIGH (ref 70–99)
Glucose-Capillary: 244 mg/dL — ABNORMAL HIGH (ref 70–99)

## 2018-07-26 LAB — BLOOD GAS, ARTERIAL
Acid-base deficit: 0.8 mmol/L (ref 0.0–2.0)
Bicarbonate: 23.4 mmol/L (ref 20.0–28.0)
Drawn by: 519031
O2 Content: 2 L/min
O2 Saturation: 91.5 %
Patient temperature: 98.6
pCO2 arterial: 39.4 mmHg (ref 32.0–48.0)
pH, Arterial: 7.391 (ref 7.350–7.450)
pO2, Arterial: 67.9 mmHg — ABNORMAL LOW (ref 83.0–108.0)

## 2018-07-26 LAB — PROTIME-INR
INR: 2.6 — ABNORMAL HIGH (ref 0.8–1.2)
Prothrombin Time: 27.6 s — ABNORMAL HIGH (ref 11.4–15.2)

## 2018-07-26 LAB — CBC
HCT: 41.7 % (ref 39.0–52.0)
Hemoglobin: 13.3 g/dL (ref 13.0–17.0)
MCH: 26.5 pg (ref 26.0–34.0)
MCHC: 31.9 g/dL (ref 30.0–36.0)
MCV: 83.1 fL (ref 80.0–100.0)
Platelets: 452 10*3/uL — ABNORMAL HIGH (ref 150–400)
RBC: 5.02 MIL/uL (ref 4.22–5.81)
RDW: 16.5 % — ABNORMAL HIGH (ref 11.5–15.5)
WBC: 11 10*3/uL — ABNORMAL HIGH (ref 4.0–10.5)
nRBC: 0 % (ref 0.0–0.2)

## 2018-07-26 LAB — TROPONIN I
Troponin I: 0.08 ng/mL
Troponin I: 0.23 ng/mL (ref <0.03)
Troponin I: 0.36 ng/mL

## 2018-07-26 MED ORDER — ASPIRIN 81 MG PO CHEW
81.0000 mg | CHEWABLE_TABLET | ORAL | Status: AC
  Administered 2018-07-27: 06:00:00 81 mg via ORAL
  Filled 2018-07-26: qty 1

## 2018-07-26 MED ORDER — WARFARIN SODIUM 2.5 MG PO TABS
2.5000 mg | ORAL_TABLET | Freq: Once | ORAL | Status: DC
  Filled 2018-07-26: qty 1

## 2018-07-26 MED ORDER — SODIUM CHLORIDE 0.9% FLUSH: 3.0000 mL | INTRAVENOUS | Status: DC | PRN

## 2018-07-26 MED ORDER — FUROSEMIDE 10 MG/ML IJ SOLN
80.0000 mg | Freq: Once | INTRAMUSCULAR | Status: AC
  Administered 2018-07-26: 80 mg via INTRAVENOUS
  Filled 2018-07-26: qty 8

## 2018-07-26 MED ORDER — SODIUM CHLORIDE 0.9 % IV SOLN
INTRAVENOUS | Status: DC
  Administered 2018-07-27: 06:00:00 via INTRAVENOUS

## 2018-07-26 MED ORDER — METOLAZONE 2.5 MG PO TABS
2.5000 mg | ORAL_TABLET | Freq: Once | ORAL | Status: AC
  Administered 2018-07-26: 2.5 mg via ORAL
  Filled 2018-07-26: qty 1

## 2018-07-26 MED ORDER — SODIUM CHLORIDE 0.9% FLUSH
3.0000 mL | Freq: Two times a day (BID) | INTRAVENOUS | Status: DC
  Administered 2018-07-26 – 2018-08-01 (×8): 3 mL via INTRAVENOUS

## 2018-07-26 MED ORDER — ALBUTEROL SULFATE (2.5 MG/3ML) 0.083% IN NEBU
2.5000 mg | INHALATION_SOLUTION | RESPIRATORY_TRACT | Status: DC | PRN
  Administered 2018-07-26 (×2): 2.5 mg via RESPIRATORY_TRACT
  Filled 2018-07-26 (×2): qty 3

## 2018-07-26 MED ORDER — FUROSEMIDE 10 MG/ML IJ SOLN
40.0000 mg | Freq: Once | INTRAMUSCULAR | Status: AC
  Administered 2018-07-26: 40 mg via INTRAVENOUS
  Filled 2018-07-26: qty 4

## 2018-07-26 MED ORDER — IPRATROPIUM-ALBUTEROL 0.5-2.5 (3) MG/3ML IN SOLN: 3.0000 mL | RESPIRATORY_TRACT | Status: DC | PRN

## 2018-07-26 MED ORDER — SODIUM CHLORIDE 0.9 % IV SOLN: 250.0000 mL | INTRAVENOUS | Status: DC | PRN

## 2018-07-26 NOTE — Evaluation (Signed)
Speech Language Pathology Evaluation Patient Details Name: Oscar [name] MRN: [id number] DOB: 1941/01/28 Today's Date: 07/26/2018 Time: 8250-5397 SLP Time Calculation (min) (ACUTE ONLY): 43 min  Problem List:  Patient Active Problem List   Diagnosis Date Noted  . TIA (transient ischemic attack) 07/24/2018  . History of left-sided carotid endarterectomy 02/27/2015  . Carotid artery stenosis 02/27/2015  . Encounter for postoperative carotid endarterectomy surveillance 02/27/2015  . Aftercare following surgery of the circulatory system, [place] 08/10/2012  . Skin lesion 07/02/2011  . MITRAL REGURGITATION 05/03/2009  . CARDIOMYOPATHY 05/03/2009  . CAROTID ARTERY DISEASE 05/02/2009  . ATRIAL FIBRILLATION 08/29/2008   Past Medical History:  Past Medical History:  Diagnosis Date  . Anemia   . Atrial fibrillation ([place])    permanent  . Cardiomyopathy    EF 35% in the past--Improved to 55% echo 2/10. Moderately severe mitral regurgitation in the past, now improved,  . Carotid artery stenosis   . CHF (congestive heart failure) ([place]) 2002  . COPD (chronic obstructive pulmonary disease) ([place])   . Coronary artery disease    non obstructive  . Diabetes mellitus   . Dyslipidemia   . History of tobacco abuse   . Nephrolithiasis   . Tremors of nervous system    Past Surgical History:  Past Surgical History:  Procedure Laterality Date  . CAROTID ENDARTERECTOMY Left 10-21-05   cea  . left carotid endarterectomy    . TONSILLECTOMY     HPI:  pt is a 78 yo male adm [facility] with balance deficits and dysarthria *he reports dysarthria stopped after 40 minutes*.  PMH + for COPD, DM, tremors, CHF, anemia, afib.  Pt found to have a TIA.  He also is s/p left CEA in the past.  MRI negative.     Assessment / Plan / Recommendation Clinical Impression  MOCA 7.3 administered to pt - he did ask SLP to write for him due to his tremor and advised he would inform me how to complete tasks.  This direction  demonstrated good problem solving skills.  Pt scored 22/30 with difficulties in recalling words - but was able to name every word with category cue.  In addition, pt is on face mask oxygen due to his respiratory difficulties and needed to urinate in the middle of the test which increased time/distraction for recall.  Speech is clear with no dysarthria and language is fluent *expressive/receptive.  SLP provided pt with written memory strategies and advised him to have his wife check to assure he manages medications adequately.  No SLP follow up needed .      SLP Assessment  SLP Recommendation/Assessment: Patient does not need any further Speech Lanaguage Pathology Services SLP Visit Diagnosis: Dysarthria and anarthria (R47.1)    Follow Up Recommendations  None    Frequency and Duration           SLP Evaluation Cognition  Overall Cognitive Status: Within Functional Limits for tasks assessed Arousal/Alertness: Awake/alert Orientation Level: Oriented X4 Attention: Sustained Sustained Attention: Appears intact Memory: Impaired Memory Impairment: Retrieval deficit(recalled 5/5 with category cue) Awareness: Impaired Awareness Impairment: Emergent impairment(getting up on his own despite his fall risk ) Problem Solving: Appears intact Safety/Judgment: Impaired Comments: pt getting up on his own despite cues to call for assist, advised he would "try to call", suspect personality related = cognitive        Comprehension  Auditory Comprehension Overall Auditory Comprehension: Appears within functional limits for tasks assessed Yes/No Questions: Not tested Commands:  Within Functional Limits Conversation: Complex Visual Recognition/Discrimination Discrimination: Not tested Reading Comprehension Reading Status: Within funtional limits    Expression Expression Primary Mode of Expression: Verbal Verbal Expression Overall Verbal Expression: Appears within functional limits for tasks  assessed Initiation: No impairment Repetition: No impairment Naming: No impairment Pragmatics: No impairment Other Verbal Expression Comments: pt did state 9 words in sixty seconds - which is diminished from normal Written Expression Dominant Hand: Right Written Expression: (dnt, pt has a tremor but he directed SLP exactly how to write)   Oral / Motor  Oral Motor/Sensory Function Overall Oral Motor/Sensory Function: Within functional limits Motor Speech Overall Motor Speech: Appears within functional limits for tasks assessed Respiration: Within functional limits Resonance: Within functional limits Articulation: Within functional limitis Intelligibility: Intelligible Motor Planning: Witnin functional limits   GO                    Oscar [name] 07/26/2018, 12:28 PM   Oscar [name], [place] Pager [phone] Office [phone]

## 2018-07-26 NOTE — Progress Notes (Signed)
Notified that there is no PICC RN on duty tonight.  PICC line will be placed tomorrow, 07/27/2018.

## 2018-07-26 NOTE — Progress Notes (Signed)
Patient ID: Oscar [name], male   DOB: 27-Aug-1940, 78 y.o.   MRN: [id number] Patient well-known to me from remote left carotid endarterectomy.  Admitted with probable TIA.  Work-up included CT angiogram which showed vertical stenosis in the left internal carotid artery at the bifurcation with very irregular calcified plaque.  Recommend endarterectomy for reduction of stroke risk even if asymptomatic.  Patient found to have new marked depression of his ejection fraction.  Cardiology note from today reviewed.  Agree with plan for cardiac catheterization for preoperative optimization.  Patient does have a history of COPD and is marked markedly dyspneic on discussing the plans with him today.  He understands we would recommend right carotid endarterectomy after medically optimized.  Will follow with you.

## 2018-07-26 NOTE — Progress Notes (Signed)
PROGRESS NOTE    Oscar [name]  ZOX:[id number] DOB: [birth year] DOA: 07/24/2018 PCP: No primary care provider on file.   Brief Narrative:  On 07/24/2018 by Dr. Emeline Gins Elgergawy [name] a78 y.o.male,with past medical history of atrial fibrillation, on warfarin, COPD, on room air, CHF, diabetes mellitus, hyperlipidemia, patient presents to ED for evaluation for transient episode of altered mental status and slurred speech and balance difficulty, patient woke up at his usual state of health this morning, he was riding in the car with his family, brief episode where he could not remember his pain #1 he was pain for the gas, as well family noted him to have slurred speech, and he had to lean against the cart to hold himself, patient told his family he did not want to come to ED, patient was noted later to have difficulty walking going to bathroom, was no fever, chills, seizure activity, loss of consciousness noted during these episodes, no prior history of CVA. - In EDpatient reports her strength back to baseline, speech is clear, mentation is appropriate awake alert x4, his INR therapeutic at 3.6, he is showing A. fib on telemetry, he a head and neck significant for severe atheromatosis narrowing in the right ICA origin, left Endarterectomy with 40% stenosis at the proximal anastomosis, and layering pleural effusion.  Interim history Admitted with TIA like symptoms.  Found to have occlusion of the right internal carotid artery.  Also found to have new systolic CHF with an EF of 25 to 30%.  Experienced respiratory distress this morning.  Cardiology consultation currently pending.  Assessment & Plan   Acute respiratory failure -possibly secondary to CHF/cardiomyopathy -Continue supplemental oxygen -obtained CXR, reviewed, appears stable -duonebs ordered PRN -ABG ordered -IV lasix given  New sytolic heart failure/cardiomyopathy -Echocardiogram showed EF 81-19%, LV diastolic parameters  indeterminate.  Only base of left ventricle moves well.  RV moderately reduced systolic function. -Patient given IV Lasix today -Monitor intake and output, daily weights -Not appear to be overtly volume overloaded -Cardiology consulted and appreciated  Chest tightness -Patient complains of chest tightness along with inability to breathe well -Treatment plan as above -EKG obtained showing A. fib with RVR -Troponin pending -As above, cardiology consulted and appreciated  TIA -Patient presented with a brief episode of altered mental status, slurred speech, unsteady gait -CT head with no acute findings -CTA head and neck with significant right carotid artery narrowing -MRI showed no acute or subacute finding -Carotid Doppler: Bilateral ICA 1 to 39% stenosis bilateral vertebral arteries demonstrate antegrade flow -History of atrial fibrillation on anticoagulation with Coumadin -Echocardiogram pending  -LDL 55, Hemoglobin A1c 6.9 -Neurology consulted and appreciated, recommended vascular consultation  -Continue Coumadin and aspirin, statin -PT assessed patient and recommended no further therapy  Carotid artery stenosis -CTA head and neck showed significant stenosis R ICA -History of left carotid endarterectomy -Vascular surgery consulted and appreciated- Dr. Donnetta Hutching to evaluate patient. Patient will need cardiac assessment given new EF. Continue aspirin and coumadin for now. -Currently on coumadin and aspirin  Essential hypertension -Coreg, lisinopril, and lasix held -BP stable  Hyperlipidemia -Takes Zetia, fenofibrate, welchol, statin  -LDL 55  Diabetes mellitus, type II -Placed on ISS and CBG montioring  -on glipizide, tradjenta, metformin at home  Atrial fibrillation, now with RVR -Suspect RVR secondary to respiratory status -CHADSVASC 8 -Coreg held to left permissive hypertension -Continue digoxin, Coumadin  Upper thoracic adenopathy -found on CTA head/neck -CT  chest: Multiple enlarged medium sized mediastinal lymph nodes.  Differential  would include reactive adenopathy versus lymphoproliferative process.  Recommended repeat CT chest, abdomen pelvis with contrast in 1 month.  DVT Prophylaxis  coumadin  Code Status: Full  Family Communication: None at bedside  Disposition Plan:   Consultants Neurology Vascular Surgery Cardiology  Procedures  Carotid doppler Echocardiogram   Antibiotics   Anti-infectives (From admission, onward)   None      Subjective:   Oscar [name] seen and examined today.  Patient complaining of shortness of breath and some chest tightness, which started this morning.  Denies current abdominal pain, nausea or vomiting, diarrhea or constipation, dizziness or headache. Objective:   Vitals:   07/26/18 0327 07/26/18 0810 07/26/18 0929 07/26/18 0933  BP: (!) 157/99 (!) 143/99    Pulse: 83 (!) 111  93  Resp: (!) 23 (!) 26    Temp: 97.6 F (36.4 C)     TempSrc: Oral     SpO2: 98% 99% 96%   Weight:      Height:        Intake/Output Summary (Last 24 hours) at 07/26/2018 1111 Last data filed at 07/26/2018 1040 Gross per 24 hour  Intake -  Output 725 ml  Net -725 ml   Filed Weights   07/24/18 1441 07/24/18 2110  Weight: 76.2 kg 77 kg   Exam  General: Well developed, well nourished, mild distress  HEENT: NCAT, mucous membranes moist.   Neck: Supple  Cardiovascular: S1 S2 auscultated, IRR, soft SEM  Respiratory: Diffuse exp wheezing, increased WOB  Abdomen: Soft, nontender, nondistended, + bowel sounds  Extremities: warm dry without cyanosis clubbing. Mild LE edema B/L  Neuro: AAOx3, nonfocal  Psych: Anxious, however appropriate    Data Reviewed: I have personally reviewed following labs and imaging studies  CBC: Recent Labs  Lab 07/21/18 1754 07/24/18 1508 07/26/18 1008  WBC 7.2 8.5 11.0*  NEUTROABS 5.2 6.3  --   HGB 12.8* 12.3* 13.3  HCT 41.8 39.4 41.7  MCV 87.8 86.6 83.1  PLT 374  386 212*   Basic Metabolic Panel: Recent Labs  Lab 07/21/18 1754 07/24/18 1508  NA 141 137  K 3.8 3.9  CL 102 100  CO2 27 25  GLUCOSE 177* 68*  BUN 25* 27*  CREATININE 1.27* 1.22  CALCIUM 9.8 9.0   GFR: Estimated Creatinine Clearance: 48 mL/min (by C-G formula based on SCr of 1.22 mg/dL). Liver Function Tests: Recent Labs  Lab 07/24/18 1508  AST 30  ALT 20  ALKPHOS 33*  BILITOT 0.8  PROT 6.7  ALBUMIN 3.8   No results for input(s): LIPASE, AMYLASE in the last 168 hours. No results for input(s): AMMONIA in the last 168 hours. Coagulation Profile: Recent Labs  Lab 07/24/18 1508 07/25/18 0443 07/26/18 0800  INR 3.6* 2.8* 2.6*   Cardiac Enzymes: No results for input(s): CKTOTAL, CKMB, CKMBINDEX, TROPONINI in the last 168 hours. BNP (last 3 results) No results for input(s): PROBNP in the last 8760 hours. HbA1C: Recent Labs    07/25/18 0443  HGBA1C 6.9*   CBG: Recent Labs  Lab 07/25/18 0719 07/25/18 1548 07/25/18 2120 07/26/18 0609  GLUCAP 80 174* 213* 172*   Lipid Profile: Recent Labs    07/25/18 0443  CHOL 95  HDL 17*  LDLCALC 55  TRIG 113  CHOLHDL 5.6   Thyroid Function Tests: No results for input(s): TSH, T4TOTAL, FREET4, T3FREE, THYROIDAB in the last 72 hours. Anemia Panel: No results for input(s): VITAMINB12, FOLATE, FERRITIN, TIBC, IRON, RETICCTPCT in the last 72 hours.  Urine analysis:    Component Value Date/Time   COLORURINE YELLOW 07/24/2018 [place] 07/24/2018 1451   LABSPEC 1.009 07/24/2018 1451   PHURINE 6.0 07/24/2018 1451   GLUCOSEU NEGATIVE 07/24/2018 1451   [place] 07/24/2018 [place] 07/24/2018 [place] 07/24/2018 1451   PROTEINUR NEGATIVE 07/24/2018 1451   NITRITE NEGATIVE 07/24/2018 [place] 07/24/2018 1451   Sepsis Labs: @LABRCNTIP (procalcitonin:4,lacticidven:4)  ) Recent Results (from the past 240 hour(s))  SARS Coronavirus 2  (CEPHEID - Performed in [facility] hospital lab), Hosp Order     Status: None   Collection Time: 07/24/18  4:48 PM  Result Value Ref Range Status   SARS Coronavirus 2 NEGATIVE NEGATIVE Final    Comment: (NOTE) If result is NEGATIVE SARS-CoV-2 target nucleic acids are NOT DETECTED. The SARS-CoV-2 RNA is generally detectable in upper and lower  respiratory specimens during the acute phase of infection. The lowest  concentration of SARS-CoV-2 viral copies this assay can detect is 250  copies / mL. A negative result does not preclude SARS-CoV-2 infection  and should not be used as the sole basis for treatment or other  patient management decisions.  A negative result may occur with  improper specimen collection / handling, submission of specimen other  than nasopharyngeal swab, presence of viral mutation(s) within the  areas targeted by this assay, and inadequate number of viral copies  (<250 copies / mL). A negative result must be combined with clinical  observations, patient history, and epidemiological information. If result is POSITIVE SARS-CoV-2 target nucleic acids are DETECTED. The SARS-CoV-2 RNA is generally detectable in upper and lower  respiratory specimens dur ing the acute phase of infection.  Positive  results are indicative of active infection with SARS-CoV-2.  Clinical  correlation with patient history and other diagnostic information is  necessary to determine patient infection status.  Positive results do  not rule out bacterial infection or co-infection with other viruses. If result is PRESUMPTIVE POSTIVE SARS-CoV-2 nucleic acids MAY BE PRESENT.   A presumptive positive result was obtained on the submitted specimen  and confirmed on repeat testing.  While 2019 novel coronavirus  (SARS-CoV-2) nucleic acids may be present in the submitted sample  additional confirmatory testing may be necessary for epidemiological  and / or clinical management purposes  to differentiate  between  SARS-CoV-2 and other Sarbecovirus currently known to infect humans.  If clinically indicated additional testing with an alternate test  methodology [phone]) is advised. The SARS-CoV-2 RNA is generally  detectable in upper and lower respiratory sp ecimens during the acute  phase of infection. The expected result is Negative. Fact Sheet for Patients:  StrictlyIdeas.no Fact Sheet for Healthcare Providers: BankingDealers.co.za This test is not yet approved or cleared by the [place] FDA and has been authorized for detection and/or diagnosis of SARS-CoV-2 by FDA under an Emergency Use Authorization (EUA).  This EUA will remain in effect (meaning this test can be used) for the duration of the COVID-19 declaration under Section 564(b)(1) of the Act, 21 U.S.C. section 360bbb-3(b)(1), unless the authorization is terminated or revoked sooner. Performed at [facility], [address], [place], [place] [address]       Radiology Studies: Ct Angio Head W Or Wo Contrast  Result Date: 07/24/2018 CLINICAL DATA:  Ataxia with stroke suspected EXAM: CT ANGIOGRAPHY HEAD AND NECK TECHNIQUE: Multidetector CT imaging of the head and neck was performed using the standard protocol  during bolus administration of intravenous contrast. Multiplanar CT image reconstructions and MIPs were obtained to evaluate the vascular anatomy. Carotid stenosis measurements (when applicable) are obtained utilizing NASCET criteria, using the distal internal carotid diameter as the denominator. CONTRAST:  22mL OMNIPAQUE IOHEXOL 350 MG/ML SOLN COMPARISON:  Noncontrast head CT earlier today FINDINGS: CTA NECK FINDINGS Aortic arch: Atherosclerotic plaque.  Three vessel branching. Right carotid system: Bulky calcified plaque at the bifurcation with high-grade proximal ICA narrowing difficult to quantify due to degree of calcified plaque blooming. Downstream vessel is smooth and  patent. Left carotid system: Mixed density plaque along the common carotid artery. Patulous bifurcation and proximal ICA after endarterectomy. There is narrowing at the presumed proximal anastomosis which measures 40% (narrowed segment compared to the more proximal vessel given the immediate subsequent bifurcation and postoperative patulous appearance). Vertebral arteries: Proximal subclavian atherosclerosis. Calcified plaque at the left vertebral origin with moderate narrowing. The downstream vessels are smooth and widely patent. Skeleton: Negative Other neck: No emergent finding Upper chest: There are layering pleural effusions. Borderline interlobular septal thickening with airway thickening. There are enlarged mediastinal lymph nodes which are partially covered, measuring 17 mm at the left paratracheal station. Review of the MIP images confirms the above findings CTA HEAD FINDINGS Anterior circulation: Suboptimal bolus density. Calcified plaque along the bilateral carotid siphons without evidence of severe narrowing. Posterior circulation: The vertebral and basilar arteries are smooth and widely patent. Negative for branch occlusion or flow limiting stenosis. Negative for aneurysm. Venous sinuses: Patent Anatomic variants: None significant Delayed phase: Not obtained in the emergent setting Review of the MIP images confirms the above findings IMPRESSION: 1. No emergent finding. 2. Severe atheromatous narrowing at the right ICA origin to the degree that the lumen is not measurable. 3. Left carotid endarterectomy with ~40% stenosis at the proximal anastomosis. 4. Moderate atheromatous narrowing at the left vertebral origin. 5. Upper thoracic adenopathy that is partially covered. Recommend dedicated chest CT after convalescence. 6. Layering pleural effusions on both sides. Electronically Signed   By: Monte Fantasia M.D.   On: 07/24/2018 16:49   Dg Chest 2 View  Result Date: 07/26/2018 CLINICAL DATA:  Shortness  of breath EXAM: CHEST - 2 VIEW COMPARISON:  07/24/2018 FINDINGS: Bilateral pleural effusions are noted similar to that seen on prior CT examination. Central vascular congestion is again noted. Mild interstitial edema is noted. No bony abnormality is seen. IMPRESSION: Changes of CHF stable from the previous exam. Electronically Signed   By: Inez Catalina M.D.   On: 07/26/2018 08:31   Ct Head Wo Contrast  Result Date: 07/24/2018 CLINICAL DATA:  Ataxia EXAM: CT HEAD WITHOUT CONTRAST TECHNIQUE: Contiguous axial images were obtained from the base of the skull through the vertex without intravenous contrast. COMPARISON:  None. FINDINGS: Brain: There is atrophy and chronic small vessel disease changes. No acute intracranial abnormality. Specifically, no hemorrhage, hydrocephalus, mass lesion, acute infarction, or significant intracranial injury. Vascular: No hyperdense vessel or unexpected calcification. Skull: No acute calvarial abnormality. Sinuses/Orbits: Visualized paranasal sinuses and mastoids clear. Orbital soft tissues unremarkable. Other: None IMPRESSION: Atrophy, chronic microvascular disease. No acute intracranial abnormality. Electronically Signed   By: Rolm Baptise M.D.   On: 07/24/2018 15:17   Ct Angio Neck W And/or Wo Contrast  Result Date: 07/24/2018 CLINICAL DATA:  Ataxia with stroke suspected EXAM: CT ANGIOGRAPHY HEAD AND NECK TECHNIQUE: Multidetector CT imaging of the head and neck was performed using the standard protocol during bolus administration of intravenous contrast. Multiplanar CT  image reconstructions and MIPs were obtained to evaluate the vascular anatomy. Carotid stenosis measurements (when applicable) are obtained utilizing NASCET criteria, using the distal internal carotid diameter as the denominator. CONTRAST:  34mL OMNIPAQUE IOHEXOL 350 MG/ML SOLN COMPARISON:  Noncontrast head CT earlier today FINDINGS: CTA NECK FINDINGS Aortic arch: Atherosclerotic plaque.  Three vessel branching.  Right carotid system: Bulky calcified plaque at the bifurcation with high-grade proximal ICA narrowing difficult to quantify due to degree of calcified plaque blooming. Downstream vessel is smooth and patent. Left carotid system: Mixed density plaque along the common carotid artery. Patulous bifurcation and proximal ICA after endarterectomy. There is narrowing at the presumed proximal anastomosis which measures 40% (narrowed segment compared to the more proximal vessel given the immediate subsequent bifurcation and postoperative patulous appearance). Vertebral arteries: Proximal subclavian atherosclerosis. Calcified plaque at the left vertebral origin with moderate narrowing. The downstream vessels are smooth and widely patent. Skeleton: Negative Other neck: No emergent finding Upper chest: There are layering pleural effusions. Borderline interlobular septal thickening with airway thickening. There are enlarged mediastinal lymph nodes which are partially covered, measuring 17 mm at the left paratracheal station. Review of the MIP images confirms the above findings CTA HEAD FINDINGS Anterior circulation: Suboptimal bolus density. Calcified plaque along the bilateral carotid siphons without evidence of severe narrowing. Posterior circulation: The vertebral and basilar arteries are smooth and widely patent. Negative for branch occlusion or flow limiting stenosis. Negative for aneurysm. Venous sinuses: Patent Anatomic variants: None significant Delayed phase: Not obtained in the emergent setting Review of the MIP images confirms the above findings IMPRESSION: 1. No emergent finding. 2. Severe atheromatous narrowing at the right ICA origin to the degree that the lumen is not measurable. 3. Left carotid endarterectomy with ~40% stenosis at the proximal anastomosis. 4. Moderate atheromatous narrowing at the left vertebral origin. 5. Upper thoracic adenopathy that is partially covered. Recommend dedicated chest CT after  convalescence. 6. Layering pleural effusions on both sides. Electronically Signed   By: Monte Fantasia M.D.   On: 07/24/2018 16:49   Ct Chest Wo Contrast  Result Date: 07/25/2018 CLINICAL DATA:  Enlarged lymph nodes identified on neck CT. EXAM: CT CHEST WITHOUT CONTRAST TECHNIQUE: Multidetector CT imaging of the chest was performed following the standard protocol without IV contrast. COMPARISON:  Radiograph 07/24/2018 neck CT 07/24/2018 FINDINGS: Cardiovascular: Coronary artery calcification and aortic atherosclerotic calcification. Mediastinum/Nodes: Small bilateral axillary lymph nodes measure up to 12 mm short axis in the RIGHT axilla. No supraclavicular adenopathy. Multiple mildly enlarged paratracheal lymph nodes are present. Enlarged 16 mm RIGHT lower paratracheal lymph node. Similar 16 mm LEFT lower paratracheal lymph node. Subcarinal nodes measured 18 mm short axis. Lungs/Pleura: Moderate layering RIGHT effusion and small LEFT effusion. There is bibasilar passive atelectasis. Interlobular septal thickening within the lungs consistent with interstitial edema. Upper Abdomen: Limited view of the liver, kidneys, pancreas are unremarkable. Normal adrenal glands. Musculoskeletal: No aggressive osseous lesion. IMPRESSION: 1. Multiple enlarged medium-size mediastinal lymph nodes. Differential would include reactive adenopathy versus lymphoproliferative process (e.g. CLL). Favor lymphoproliferative process. Recommend clinical correlation and consider hematology oncology consult. At minimum recommend follow-up CT chest abdomen pelvis with contrast in 1 month. Include CT abdomen pelvis to evaluate for abdominal/pelvic lymphadenopathy. 2. Bilateral pleural effusions with interstitial edema consistent with congestive heart failure. 3. Coronary artery calcification and Aortic Atherosclerosis (ICD10-I70.0). Electronically Signed   By: Suzy Bouchard M.D.   On: 07/25/2018 15:45   Mr Brain Wo Contrast  Result  Date: 07/25/2018 CLINICAL DATA:  Acute neurological of an yesterday with altered mental status, slurred speech and balance disturbance. EXAM: MRI HEAD WITHOUT CONTRAST TECHNIQUE: Multiplanar, multiecho pulse sequences of the brain and surrounding structures were obtained without intravenous contrast. COMPARISON:  CT studies done yesterday. FINDINGS: Brain: Diffusion imaging does not show any acute or subacute infarction. No abnormality is seen affecting the pons or cerebellum. There are mild to moderate chronic small-vessel ischemic changes of the cerebral hemispheric deep and subcortical white matter. No large vessel territory infarction. No mass lesion, hemorrhage, hydrocephalus or extra-axial collection. Vascular: Major vessels at the base of the brain show flow. Skull and upper cervical spine: Negative. Benign appearing heterogeneity of the clivus. Sinuses/Orbits: Clear/normal Other: None IMPRESSION: No acute or subacute finding by MRI. Mild to moderate chronic small-vessel ischemic changes of the cerebral hemispheric white matter. Electronically Signed   By: Nelson Chimes M.D.   On: 07/25/2018 08:23   Dg Chest Portable 1 View  Result Date: 07/24/2018 CLINICAL DATA:  Altered mental status today. EXAM: PORTABLE CHEST 1 VIEW COMPARISON:  Single-view of the chest 07/21/2018. PA and lateral chest 10/17/2005. FINDINGS: Cardiomegaly, pulmonary edema and small bilateral pleural effusions appear unchanged. Atherosclerosis is noted. IMPRESSION: No change in congestive heart failure with associated small bilateral pleural effusions. Electronically Signed   By: Inge Rise M.D.   On: 07/24/2018 16:27   Vas US Carotid  Result Date: 07/25/2018 Carotid Arterial Duplex Study Indications:       TIA. Risk Factors:      Diabetes, coronary artery disease. Comparison Study:  Previous study: 02/09/2014 Performing Technologist: Abram Sander RVS  Examination Guidelines: A complete evaluation includes B-mode imaging,  spectral Doppler, color Doppler, and power Doppler as needed of all accessible portions of each vessel. Bilateral testing is considered an integral part of a complete examination. Limited examinations for reoccurring indications may be performed as noted.  Right Carotid Findings: +----------+--------+--------+--------+----------------------+--------+           PSV cm/sEDV cm/sStenosisDescribe              Comments +----------+--------+--------+--------+----------------------+--------+ CCA Prox  38      7               calcific and irregular         +----------+--------+--------+--------+----------------------+--------+ CCA Distal39      9               calcific and irregular         +----------+--------+--------+--------+----------------------+--------+ ICA Prox  316     62      60-79%  calcific and irregular         +----------+--------+--------+--------+----------------------+--------+ ICA Mid   72      11                                             +----------+--------+--------+--------+----------------------+--------+ ICA Distal62      22                                             +----------+--------+--------+--------+----------------------+--------+ ECA       97      12                                             +----------+--------+--------+--------+----------------------+--------+ +----------+--------+-------+--------+-------------------+  PSV cm/sEDV cmsDescribeArm Pressure (mmHG) +----------+--------+-------+--------+-------------------+ [id number]                                         +----------+--------+-------+--------+-------------------+ +---------+--------+--+--------+--+---------+ VertebralPSV cm/s49EDV cm/s11Antegrade +---------+--------+--+--------+--+---------+  Left Carotid Findings: +----------+--------+--------+--------+-------------------------+--------+           PSV cm/sEDV cm/sStenosisDescribe                  Comments +----------+--------+--------+--------+-------------------------+--------+ CCA Prox  47      7               heterogenous                      +----------+--------+--------+--------+-------------------------+--------+ CCA Distal111     9               heterogenous and calcific         +----------+--------+--------+--------+-------------------------+--------+ ICA Prox  56      17      1-39%   heterogenous                      +----------+--------+--------+--------+-------------------------+--------+ ICA Distal64      21                                                +----------+--------+--------+--------+-------------------------+--------+ ECA       137                                                       +----------+--------+--------+--------+-------------------------+--------+ +----------+--------+--------+--------+-------------------+ SubclavianPSV cm/sEDV cm/sDescribeArm Pressure (mmHG) +----------+--------+--------+--------+-------------------+           62                                          +----------+--------+--------+--------+-------------------+ +---------+--------+--+--------+-+---------+ VertebralPSV cm/s36EDV cm/s7Antegrade +---------+--------+--+--------+-+---------+  Summary: Right Carotid: Velocities in the right ICA are consistent with a 60-79%                stenosis. 60-79% stenosis based off plaque morphology. Left Carotid: Velocities in the left ICA are consistent with a 1-39% stenosis. Vertebrals: Bilateral vertebral arteries demonstrate antegrade flow. *See table(s) above for measurements and observations.  Electronically signed by Monica Martinez MD on 07/25/2018 at 3:07:12 PM.    Final      Scheduled Meds: . aspirin EC  81 mg Oral Daily  . colesevelam  1,250 mg Oral TID  . digoxin  0.125 mg Oral Daily  . ezetimibe  10 mg Oral Daily  . fenofibrate  160 mg Oral Daily  . insulin aspart  0-9 Units  Subcutaneous TID WC  . multivitamin with minerals  1 tablet Oral Daily  . omega-3 acid ethyl esters  2 g Oral BID  . pravastatin  80 mg Oral Daily  . warfarin  2.5 mg Oral ONCE-1800  . Warfarin - Pharmacist Dosing Inpatient   Does not apply q1800   Continuous Infusions:   LOS: 1 day   Time Spent in minutes   65  minutes   Laini Urick D.O. on 07/26/2018 at 11:11 AM  Between 7am to 7pm - Please see pager noted on [email or web address]  After 7pm go to [email or web address]  And look for the night coverage person covering for me after hours  [facility] Office  [phone]

## 2018-07-26 NOTE — Consult Note (Addendum)
Cardiology Consultation:   Patient ID: Oscar [name] MRN: [id number]; DOB: May 08, 1940  Admit date: 07/24/2018 Date of Consult: 07/26/2018  Primary Care Provider: No primary care provider on file. Primary Cardiologist: Minus Breeding, MD  Primary Electrophysiologist:  None    Patient Profile:   Oscar [name] is a 78 y.o. male with a hx of permanent atrial fibrillation, chronic anticoagulation w/ coumadin, dHF, prior h/o systolic HF w/ normalization of EF, nonobstructive CAD, HTN, DM, HLD, carotid artery disease s/p left CEA in 2007 and COPD on nocturnal O2, who is being seen today for the evaluation of new systolic heart failure and perioperative risk assessment, prior to possible carotid endarterectomy, at the request of Dr. Ree Kida, Internal Medicine.   History of Present Illness:   Oscar [name] is followed by Dr. Percival Spanish and has permanent atrial fibrillation. He is on chronic coumadin therapy for stroke prophylaxis. CHA2DS2 VASc score is at least 6 for CHF, HTN,  DM, Age > 63 (2) and vascular disease (possibly 8 given concerns for recent TIA). Per office notes, he has a prior history of cardiomyopathy. EF was as low as 35% but improved back to normal. Echocardiograms dating back to 2010 have shown normal EF. Hist last echo prior to this admit was in July 2019 and showed an EF of 65-70%. Notes also outline h/o prior cardiac cath and documentation of nonobstructive CAD (unable to locate actual cath report in echart). He has significant PVD. He underwent left CEA by Dr. Donnetta Hutching in 2007 and now found to have high grade right carotid artery stenosis with concerns for possible recent TIA. CTA of the neck shows >80% right ICA stenosis. He has been evaluated by vascular surgery and felt to benefit from revascularization, pending cardiac clearance. Additional medical problems include T2DM, HTN, HLD and COPD, on nocturnal O2.  Pt presented to the ED on 07/24/18 w/ CC of AMS, slurred speech and gait  disterbances. In the ED, telemetry/ EKG showed atrial fibrillation and INR was therapeutic at 3.6. Head CT showed no acute findings but CTA of the neck showed high grade Rt carotid artery disease, > 80%.  CTA of neck also showed upper thoracic adenopathy, leading to chest CT. Chest CT showed bilateral pleural effusions with interstitial edema consistent with congestive heart failure. There is also multiple enlarged medium-size mediastinal lymph nodes. Differential would include reactive adenopathy versus lymphoproliferative process (e.g CLL). Per radiologist interpretation, favor  lymphoproliferative process. Recommend clinical correlation and consider hematology oncology consult. Given concerns for CHF, BNP was obtained and was markedly elevated at 2,433. Subsequent echo showed severely reduced systolic HF, with new EF of 20-25% and akinesis.  Only the base of the left ventricle moves well. The right ventricle has moderately reduced systolic function. Both the left and right atria are severely dilated. No significant valvular abnormality.   He has been admitted by IM. His antihypertensives were held on admit to allow for permissive hypertension. Hgb A1c 6.9. Lipid panel showed TG at 113 and LDL at 55 mg/dl. UDS negative. SARS Coronavirus 2 negative. No recent complaints of CP but he has experienced exertional dyspnea over the last 7 months. No orthopnea or PND. He notes mild LEE.  Troponin I negative x 1.     Past Medical History:  Diagnosis Date  . Anemia   . Atrial fibrillation ([place])    permanent  . Cardiomyopathy    EF 35% in the past--Improved to 55% echo 2/10. Moderately severe mitral regurgitation in the past, now improved,  .  Carotid artery stenosis   . CHF (congestive heart failure) ([place]) 2002  . COPD (chronic obstructive pulmonary disease) ([place])   . Coronary artery disease    non obstructive  . Diabetes mellitus   . Dyslipidemia   . History of tobacco abuse   . Nephrolithiasis   .  Tremors of nervous system     Past Surgical History:  Procedure Laterality Date  . CAROTID ENDARTERECTOMY Left 10-21-05   cea  . left carotid endarterectomy    . TONSILLECTOMY       Home Medications:  Prior to Admission medications   Medication Sig Start Date End Date Taking? Authorizing Provider  albuterol (PROAIR HFA) 108 (90 BASE) MCG/ACT inhaler Inhale 2 puffs into the lungs. 02/09/14  Yes [provider]  amLODipine (NORVASC) 2.5 MG tablet Take 1 tablet (2.5 mg total) by mouth daily. 11/17/17  Yes Minus Breeding, MD  carvedilol (COREG) 25 MG tablet TAKE 1 TABLET TWICE A DAY WITH MEALS (DUE FOR OFFICE VISIT WITH DOCTOR Dunlo) 08/03/17  Yes Minus Breeding, MD  colesevelam [facility]) 625 MG tablet Take 1,250 mg by mouth 3 (three) times daily.    Yes [provider]  digoxin (LANOXIN) 0.125 MG tablet Take 1 tablet (0.125 mg total) by mouth daily. 08/14/17  Yes Minus Breeding, MD  ezetimibe (ZETIA) 10 MG tablet Take 10 mg by mouth daily.     Yes [provider]  fenofibrate 160 MG tablet Take 160 mg by mouth daily.   Yes [provider]  furosemide (LASIX) 20 MG tablet Take 0.5 tablets (10 mg total) by mouth daily. 05/21/16  Yes Minus Breeding, MD  furosemide (LASIX) 40 MG tablet Take 1 tablet (40 mg total) by mouth 2 (two) times daily. 07/21/18  Yes Virgel Manifold, MD  glipiZIDE (GLUCOTROL XL) 10 MG 24 hr tablet TAKE 1 TABLET BY MOUTH TWICE DAILY 05/22/14  Yes [provider]  lisinopril (PRINIVIL,ZESTRIL) 40 MG tablet Take 40 mg by mouth daily.    Yes [provider]  metFORMIN (GLUCOPHAGE-XR) 500 MG 24 hr tablet Take 500 mg by mouth 2 (two) times daily.    Yes [provider]  Multiple Vitamin (MULTIVITAMIN) tablet Take 1 tablet by mouth daily.   Yes [provider]  Omega-3 Fatty Acids (FISH OIL) 1200 MG CAPS Take by mouth 3 (three) times daily.   Yes [provider]  potassium chloride 20 MEQ TBCR Take 10  mEq by mouth daily. 07/21/18  Yes Virgel Manifold, MD  pravastatin (PRAVACHOL) 40 MG tablet Take 2 tablets by mouth daily. 07/12/18  Yes [provider]  warfarin (COUMADIN) 7.5 MG tablet Take 7.5 mg by mouth daily. Pt takes 1 tab qd on Mon, Wed, Thurs, Sat   Yes [provider]  linagliptin (TRADJENTA) 5 MG TABS tablet Take 5 mg by mouth daily.  05/15/14 07/22/17  [provider]    Inpatient Medications: Scheduled Meds: . aspirin EC  81 mg Oral Daily  . colesevelam  1,250 mg Oral TID  . digoxin  0.125 mg Oral Daily  . ezetimibe  10 mg Oral Daily  . fenofibrate  160 mg Oral Daily  . insulin aspart  0-9 Units Subcutaneous TID WC  . multivitamin with minerals  1 tablet Oral Daily  . omega-3 acid ethyl esters  2 g Oral BID  . pravastatin  80 mg Oral Daily  . Warfarin - Pharmacist Dosing Inpatient   Does not apply q1800   Continuous Infusions:  PRN  Meds: acetaminophen **OR** acetaminophen (TYLENOL) oral liquid 160 mg/5 mL **OR** acetaminophen, albuterol, calcium carbonate, hydrALAZINE, ipratropium-albuterol, senna-docusate  Allergies:    Allergies  Allergen Reactions  . Penicillins Hives    Blisters on hands  . Statins     Joint and muscle pain and low energy.    Social History:   Social History   Socioeconomic History  . Marital status: Married    Spouse name: Not on file  . Number of children: 5  . Years of education: 50  . Highest education level: Not on file  Occupational History  . Occupation: Retired  Scientific laboratory technician  . Financial resource strain: Not on file  . Food insecurity:    Worry: Not on file    Inability: Not on file  . Transportation needs:    Medical: Not on file    Non-medical: Not on file  Tobacco Use  . Smoking status: Former Smoker    Types: Cigarettes    Last attempt to quit: 02/25/2000    Years since quitting: 18.4  . Smokeless tobacco: Former Network engineer and Sexual Activity  . Alcohol use: No  . Drug use: No  .  Sexual activity: Not on file  Lifestyle  . Physical activity:    Days per week: Not on file    Minutes per session: Not on file  . Stress: Not on file  Relationships  . Social connections:    Talks on phone: Not on file    Gets together: Not on file    Attends religious service: Not on file    Active member of club or organization: Not on file    Attends meetings of clubs or organizations: Not on file    Relationship status: Not on file  . Intimate partner violence:    Fear of current or ex partner: Not on file    Emotionally abused: Not on file    Physically abused: Not on file    Forced sexual activity: Not on file  Other Topics Concern  . Not on file  Social History Narrative   Lives at home with his wife.   Right-handed.   3-4 cups caffeine per day.    Family History:    Family History  Problem Relation Age of Onset  . Coronary artery disease Mother   . Diabetes Mother   . Heart disease Mother        Before age 22 and  CHF  . Hypertension Mother   . Other Mother        varicose veins  . Heart attack Mother   . Heart disease Sister   . Hypertension Sister   . Heart attack Sister   . Heart disease Brother   . Hyperlipidemia Brother   . Hypertension Brother   . Other Brother        varicose veins  . Heart attack Brother   . Heart disease Son 73       Heart Disease before age 91- Open hear surgery  . Deep vein thrombosis Son   . Heart attack Son   . Coronary artery disease Other   . Diabetes Daughter   . Hypertension Daughter   . Other Daughter        varicose veins     ROS:  Please see the history of present illness.   All other ROS reviewed and negative.     Physical Exam/Data:   Vitals:   07/25/18 2006 07/25/18 2331 07/26/18 0327 07/26/18  0810  BP: (!) 142/80 137/82 (!) 157/99 (!) 143/99  Pulse: 77 87 83 (!) 111  Resp:  (!) 21 (!) 23 (!) 26  Temp: 98.2 F (36.8 C) 97.9 F (36.6 C) 97.6 F (36.4 C)   TempSrc: Oral Oral Oral   SpO2: 95% 99%  98% 99%  Weight:      Height:       No intake or output data in the 24 hours ending 07/26/18 0924 Last 3 Weights 07/24/2018 07/24/2018 07/21/2018  Weight (lbs) 169 lb 12.1 oz 168 lb 168 lb  Weight (kg) 77 kg 76.204 kg 76.204 kg     Body mass index is 27.4 kg/m.  General:  Elderly WM, in NAD, HEENT: normal Lymph: no adenopathy Neck: no JVD Endocrine:  No thryomegaly Vascular: No carotid bruits; FA pulses 2+ bilaterally without bruits  Cardiac:  irregularly irregular rhythm, regular rate  Lungs:  Decreased BS at the bases bilaterally Abd: soft, nontender, no hepatomegaly  Ext: trace bilateral LEE Musculoskeletal:  No deformities, BUE and BLE strength normal and equal Skin: warm and dry  Neuro:  CNs 2-12 intact, no focal abnormalities noted Psych:  Normal affect   EKG:  The EKG was personally reviewed and demonstrates:  Atrial fibrillation w/ rVR 103 bpm, RAD Telemetry:  Telemetry was personally reviewed and demonstrates:  Atrial fibrillation, 90s-low 100s. Few PVCs. No ventricular arrhthymias.   Relevant CV Studies: 2D Echo 07/25/18 IMPRESSIONS    1. The left ventricle has severely reduced systolic function, with an ejection fraction of 25-30%. The cavity size was mildly dilated. Left ventricular diastolic Doppler parameters are indeterminate.  2. LVEF is severly depressed Only base of ventricle moves well.  3. The right ventricle has moderately reduced systolic function. The cavity was mildly enlarged. There is not assessed.  4. Left atrial size was severely dilated.  5. Right atrial size was severely dilated.  6. Mildly thickened tricuspid valve leaflets.  7. The mitral valve is abnormal. Mild thickening of the mitral valve leaflet. There is mild mitral annular calcification present.  8. The tricuspid valve is abnormal.  9. The aortic valve is tricuspid. Mild thickening of the aortic valve. Mild calcification of the aortic valve. Aortic valve regurgitation is mild by color  flow Doppler. 10. The inferior vena cava was dilated in size with <50% respiratory variability. 11. The interatrial septum was not assessed.  Laboratory Data:  Chemistry Recent Labs  Lab 07/21/18 1754 07/24/18 1508  NA 141 137  K 3.8 3.9  CL 102 100  CO2 27 25  GLUCOSE 177* 68*  BUN 25* 27*  CREATININE 1.27* 1.22  CALCIUM 9.8 9.0  GFRNONAA 53* 56*  GFRAA >60 >60  ANIONGAP 12 12    Recent Labs  Lab 07/24/18 1508  PROT 6.7  ALBUMIN 3.8  AST 30  ALT 20  ALKPHOS 33*  BILITOT 0.8   Hematology Recent Labs  Lab 07/21/18 1754 07/24/18 1508  WBC 7.2 8.5  RBC 4.76 4.55  HGB 12.8* 12.3*  HCT 41.8 39.4  MCV 87.8 86.6  MCH 26.9 27.0  MCHC 30.6 31.2  RDW 17.5* 17.0*  PLT 374 386   Cardiac EnzymesNo results for input(s): TROPONINI in the last 168 hours. No results for input(s): TROPIPOC in the last 168 hours.  BNP Recent Labs  Lab 07/21/18 1754  BNP 2,433.0*    DDimer No results for input(s): DDIMER in the last 168 hours.  Radiology/Studies:  Ct Angio Head W Or Wo Contrast  Result  Date: 07/24/2018 CLINICAL DATA:  Ataxia with stroke suspected EXAM: CT ANGIOGRAPHY HEAD AND NECK TECHNIQUE: Multidetector CT imaging of the head and neck was performed using the standard protocol during bolus administration of intravenous contrast. Multiplanar CT image reconstructions and MIPs were obtained to evaluate the vascular anatomy. Carotid stenosis measurements (when applicable) are obtained utilizing NASCET criteria, using the distal internal carotid diameter as the denominator. CONTRAST:  41mL OMNIPAQUE IOHEXOL 350 MG/ML SOLN COMPARISON:  Noncontrast head CT earlier today FINDINGS: CTA NECK FINDINGS Aortic arch: Atherosclerotic plaque.  Three vessel branching. Right carotid system: Bulky calcified plaque at the bifurcation with high-grade proximal ICA narrowing difficult to quantify due to degree of calcified plaque blooming. Downstream vessel is smooth and patent. Left carotid  system: Mixed density plaque along the common carotid artery. Patulous bifurcation and proximal ICA after endarterectomy. There is narrowing at the presumed proximal anastomosis which measures 40% (narrowed segment compared to the more proximal vessel given the immediate subsequent bifurcation and postoperative patulous appearance). Vertebral arteries: Proximal subclavian atherosclerosis. Calcified plaque at the left vertebral origin with moderate narrowing. The downstream vessels are smooth and widely patent. Skeleton: Negative Other neck: No emergent finding Upper chest: There are layering pleural effusions. Borderline interlobular septal thickening with airway thickening. There are enlarged mediastinal lymph nodes which are partially covered, measuring 17 mm at the left paratracheal station. Review of the MIP images confirms the above findings CTA HEAD FINDINGS Anterior circulation: Suboptimal bolus density. Calcified plaque along the bilateral carotid siphons without evidence of severe narrowing. Posterior circulation: The vertebral and basilar arteries are smooth and widely patent. Negative for branch occlusion or flow limiting stenosis. Negative for aneurysm. Venous sinuses: Patent Anatomic variants: None significant Delayed phase: Not obtained in the emergent setting Review of the MIP images confirms the above findings IMPRESSION: 1. No emergent finding. 2. Severe atheromatous narrowing at the right ICA origin to the degree that the lumen is not measurable. 3. Left carotid endarterectomy with ~40% stenosis at the proximal anastomosis. 4. Moderate atheromatous narrowing at the left vertebral origin. 5. Upper thoracic adenopathy that is partially covered. Recommend dedicated chest CT after convalescence. 6. Layering pleural effusions on both sides. Electronically Signed   By: Monte Fantasia M.D.   On: 07/24/2018 16:49   Dg Chest 2 View  Result Date: 07/26/2018 CLINICAL DATA:  Shortness of breath EXAM: CHEST  - 2 VIEW COMPARISON:  07/24/2018 FINDINGS: Bilateral pleural effusions are noted similar to that seen on prior CT examination. Central vascular congestion is again noted. Mild interstitial edema is noted. No bony abnormality is seen. IMPRESSION: Changes of CHF stable from the previous exam. Electronically Signed   By: Inez Catalina M.D.   On: 07/26/2018 08:31   Ct Head Wo Contrast  Result Date: 07/24/2018 CLINICAL DATA:  Ataxia EXAM: CT HEAD WITHOUT CONTRAST TECHNIQUE: Contiguous axial images were obtained from the base of the skull through the vertex without intravenous contrast. COMPARISON:  None. FINDINGS: Brain: There is atrophy and chronic small vessel disease changes. No acute intracranial abnormality. Specifically, no hemorrhage, hydrocephalus, mass lesion, acute infarction, or significant intracranial injury. Vascular: No hyperdense vessel or unexpected calcification. Skull: No acute calvarial abnormality. Sinuses/Orbits: Visualized paranasal sinuses and mastoids clear. Orbital soft tissues unremarkable. Other: None IMPRESSION: Atrophy, chronic microvascular disease. No acute intracranial abnormality. Electronically Signed   By: Rolm Baptise M.D.   On: 07/24/2018 15:17   Ct Angio Neck W And/or Wo Contrast  Result Date: 07/24/2018 CLINICAL DATA:  Ataxia with stroke  suspected EXAM: CT ANGIOGRAPHY HEAD AND NECK TECHNIQUE: Multidetector CT imaging of the head and neck was performed using the standard protocol during bolus administration of intravenous contrast. Multiplanar CT image reconstructions and MIPs were obtained to evaluate the vascular anatomy. Carotid stenosis measurements (when applicable) are obtained utilizing NASCET criteria, using the distal internal carotid diameter as the denominator. CONTRAST:  35mL OMNIPAQUE IOHEXOL 350 MG/ML SOLN COMPARISON:  Noncontrast head CT earlier today FINDINGS: CTA NECK FINDINGS Aortic arch: Atherosclerotic plaque.  Three vessel branching. Right carotid system:  Bulky calcified plaque at the bifurcation with high-grade proximal ICA narrowing difficult to quantify due to degree of calcified plaque blooming. Downstream vessel is smooth and patent. Left carotid system: Mixed density plaque along the common carotid artery. Patulous bifurcation and proximal ICA after endarterectomy. There is narrowing at the presumed proximal anastomosis which measures 40% (narrowed segment compared to the more proximal vessel given the immediate subsequent bifurcation and postoperative patulous appearance). Vertebral arteries: Proximal subclavian atherosclerosis. Calcified plaque at the left vertebral origin with moderate narrowing. The downstream vessels are smooth and widely patent. Skeleton: Negative Other neck: No emergent finding Upper chest: There are layering pleural effusions. Borderline interlobular septal thickening with airway thickening. There are enlarged mediastinal lymph nodes which are partially covered, measuring 17 mm at the left paratracheal station. Review of the MIP images confirms the above findings CTA HEAD FINDINGS Anterior circulation: Suboptimal bolus density. Calcified plaque along the bilateral carotid siphons without evidence of severe narrowing. Posterior circulation: The vertebral and basilar arteries are smooth and widely patent. Negative for branch occlusion or flow limiting stenosis. Negative for aneurysm. Venous sinuses: Patent Anatomic variants: None significant Delayed phase: Not obtained in the emergent setting Review of the MIP images confirms the above findings IMPRESSION: 1. No emergent finding. 2. Severe atheromatous narrowing at the right ICA origin to the degree that the lumen is not measurable. 3. Left carotid endarterectomy with ~40% stenosis at the proximal anastomosis. 4. Moderate atheromatous narrowing at the left vertebral origin. 5. Upper thoracic adenopathy that is partially covered. Recommend dedicated chest CT after convalescence. 6.  Layering pleural effusions on both sides. Electronically Signed   By: Monte Fantasia M.D.   On: 07/24/2018 16:49   Ct Chest Wo Contrast  Result Date: 07/25/2018 CLINICAL DATA:  Enlarged lymph nodes identified on neck CT. EXAM: CT CHEST WITHOUT CONTRAST TECHNIQUE: Multidetector CT imaging of the chest was performed following the standard protocol without IV contrast. COMPARISON:  Radiograph 07/24/2018 neck CT 07/24/2018 FINDINGS: Cardiovascular: Coronary artery calcification and aortic atherosclerotic calcification. Mediastinum/Nodes: Small bilateral axillary lymph nodes measure up to 12 mm short axis in the RIGHT axilla. No supraclavicular adenopathy. Multiple mildly enlarged paratracheal lymph nodes are present. Enlarged 16 mm RIGHT lower paratracheal lymph node. Similar 16 mm LEFT lower paratracheal lymph node. Subcarinal nodes measured 18 mm short axis. Lungs/Pleura: Moderate layering RIGHT effusion and small LEFT effusion. There is bibasilar passive atelectasis. Interlobular septal thickening within the lungs consistent with interstitial edema. Upper Abdomen: Limited view of the liver, kidneys, pancreas are unremarkable. Normal adrenal glands. Musculoskeletal: No aggressive osseous lesion. IMPRESSION: 1. Multiple enlarged medium-size mediastinal lymph nodes. Differential would include reactive adenopathy versus lymphoproliferative process (e.g. CLL). Favor lymphoproliferative process. Recommend clinical correlation and consider hematology oncology consult. At minimum recommend follow-up CT chest abdomen pelvis with contrast in 1 month. Include CT abdomen pelvis to evaluate for abdominal/pelvic lymphadenopathy. 2. Bilateral pleural effusions with interstitial edema consistent with congestive heart failure. 3. Coronary artery calcification and  Aortic Atherosclerosis (ICD10-I70.0). Electronically Signed   By: Suzy Bouchard M.D.   On: 07/25/2018 15:45   Mr Brain Wo Contrast  Result Date:  07/25/2018 CLINICAL DATA:  Acute neurological of an yesterday with altered mental status, slurred speech and balance disturbance. EXAM: MRI HEAD WITHOUT CONTRAST TECHNIQUE: Multiplanar, multiecho pulse sequences of the brain and surrounding structures were obtained without intravenous contrast. COMPARISON:  CT studies done yesterday. FINDINGS: Brain: Diffusion imaging does not show any acute or subacute infarction. No abnormality is seen affecting the pons or cerebellum. There are mild to moderate chronic small-vessel ischemic changes of the cerebral hemispheric deep and subcortical white matter. No large vessel territory infarction. No mass lesion, hemorrhage, hydrocephalus or extra-axial collection. Vascular: Major vessels at the base of the brain show flow. Skull and upper cervical spine: Negative. Benign appearing heterogeneity of the clivus. Sinuses/Orbits: Clear/normal Other: None IMPRESSION: No acute or subacute finding by MRI. Mild to moderate chronic small-vessel ischemic changes of the cerebral hemispheric white matter. Electronically Signed   By: Nelson Chimes M.D.   On: 07/25/2018 08:23   Dg Chest Portable 1 View  Result Date: 07/24/2018 CLINICAL DATA:  Altered mental status today. EXAM: PORTABLE CHEST 1 VIEW COMPARISON:  Single-view of the chest 07/21/2018. PA and lateral chest 10/17/2005. FINDINGS: Cardiomegaly, pulmonary edema and small bilateral pleural effusions appear unchanged. Atherosclerosis is noted. IMPRESSION: No change in congestive heart failure with associated small bilateral pleural effusions. Electronically Signed   By: Inge Rise M.D.   On: 07/24/2018 16:27   Vas US Carotid  Result Date: 07/25/2018 Carotid Arterial Duplex Study Indications:       TIA. Risk Factors:      Diabetes, coronary artery disease. Comparison Study:  Previous study: 02/09/2014 Performing Technologist: Abram Sander RVS  Examination Guidelines: A complete evaluation includes B-mode imaging, spectral  Doppler, color Doppler, and power Doppler as needed of all accessible portions of each vessel. Bilateral testing is considered an integral part of a complete examination. Limited examinations for reoccurring indications may be performed as noted.  Right Carotid Findings: +----------+--------+--------+--------+----------------------+--------+           PSV cm/sEDV cm/sStenosisDescribe              Comments +----------+--------+--------+--------+----------------------+--------+ CCA Prox  38      7               calcific and irregular         +----------+--------+--------+--------+----------------------+--------+ CCA Distal39      9               calcific and irregular         +----------+--------+--------+--------+----------------------+--------+ ICA Prox  316     62      60-79%  calcific and irregular         +----------+--------+--------+--------+----------------------+--------+ ICA Mid   72      11                                             +----------+--------+--------+--------+----------------------+--------+ ICA Distal62      22                                             +----------+--------+--------+--------+----------------------+--------+ ECA       97  12                                             +----------+--------+--------+--------+----------------------+--------+ +----------+--------+-------+--------+-------------------+           PSV cm/sEDV cmsDescribeArm Pressure (mmHG) +----------+--------+-------+--------+-------------------+ [id number]                                         +----------+--------+-------+--------+-------------------+ +---------+--------+--+--------+--+---------+ VertebralPSV cm/s49EDV cm/s11Antegrade +---------+--------+--+--------+--+---------+  Left Carotid Findings: +----------+--------+--------+--------+-------------------------+--------+           PSV cm/sEDV cm/sStenosisDescribe                  Comments +----------+--------+--------+--------+-------------------------+--------+ CCA Prox  47      7               heterogenous                      +----------+--------+--------+--------+-------------------------+--------+ CCA Distal111     9               heterogenous and calcific         +----------+--------+--------+--------+-------------------------+--------+ ICA Prox  56      17      1-39%   heterogenous                      +----------+--------+--------+--------+-------------------------+--------+ ICA Distal64      21                                                +----------+--------+--------+--------+-------------------------+--------+ ECA       137                                                       +----------+--------+--------+--------+-------------------------+--------+ +----------+--------+--------+--------+-------------------+ SubclavianPSV cm/sEDV cm/sDescribeArm Pressure (mmHG) +----------+--------+--------+--------+-------------------+           62                                          +----------+--------+--------+--------+-------------------+ +---------+--------+--+--------+-+---------+ VertebralPSV cm/s36EDV cm/s7Antegrade +---------+--------+--+--------+-+---------+  Summary: Right Carotid: Velocities in the right ICA are consistent with a 60-79%                stenosis. 60-79% stenosis based off plaque morphology. Left Carotid: Velocities in the left ICA are consistent with a 1-39% stenosis. Vertebrals: Bilateral vertebral arteries demonstrate antegrade flow. *See table(s) above for measurements and observations.  Electronically signed by Monica Martinez MD on 07/25/2018 at 3:07:12 PM.    Final     Assessment and Plan:   Oscar [name] is a 78 y.o. male with a hx of permanent atrial fibrillation, chronic anticoagulation w/ coumadin, dHF, prior h/o systolic HF w/ normalization of EF, nonobstructive CAD,  HTN, DM, HLD,  carotid artery disease s/p left CEA in 2007 and COPD on nocturnal O2, who is being seen today for the evaluation of new systolic heart  failure and perioperative risk assessment, prior to possible carotid endarterectomy, at the request of Dr. Ree Kida, Internal Medicine.   1. New Cardiomyopathy/ Systolic HF: Chest CT shows bilateral pleural effusions with interstitial edema consistent with congestive heart failure. BNP markedly elevated at 2,433. Subsequent echo shows new, severely reduced LV systolic function w/ EF at 20-25% and akinesis.  Only the base of the left ventricle moves well. The right ventricle has moderately reduced systolic function. Both the left and right atria are severely dilated. No significant valvular abnormality. He has h/o nonobstructive CAD in the past, but no f/u ischemic evaluation in recent years. He has multiple risk factors for CAD, including known high grade PVD, T2DM, HTN and HLD. Given newly reduced LVF, new akinesis of myocardium and plans for carotid artery revascularization, recommend definitive cardiac catheterization to exclude CAD. He will also need treatment w/ guidelines directed medical therapy for systolic HF. However, given high grade carotid artery disease, we will continue to avoid antihypertensives, to allow for permissive hypertension. Once able to resume, would recommend addition of an ARB/Entresto, and consideration of  blocker and aldosterone antagonist. Renal function is ok. Plan cardiac cath once INR is < 1.8. He does not appear grossly volume overloaded on exam. Right heart cath at time of LHC may also be beneficial to help assess volume status and to help guide diuresis. Few PVCs noted on tele, but no sustained ventricular arrhthymias.   2. Carotid Artery Disease: s/p left CEA in 2007. Now with high grade Rt ICA stenosis, > 80% and recent ? TIA. Vascular surgery following and recommending revascularization, but as noted above, would recommend ischemic  evaluation prior to clearance. Continue ASA, statin and Zetia. LDL 55 mg/dL.   3. Chronic Atrial Fibrillation:  Current rates on tele in the 90s-low 100s and asymptomatic. Coreg remains on hold for reasons outlined above. Home digoxin ordered. Monitor K. Continue to monitor on tele. CHA2DS2 VASc score is at least 6 for CHF, HTN,  DM, Age > 59 (2) and vascular disease (possibly 8 given concerns for recent TIA). Coumadin on hold for anticipated invasive procedures. Needs cardiac cath and carotid artery intervention. INR drifting downward, 3.6>>2.8>>2.6. Resume once all invasive procedures are completed.   4. HTN: home meds on hold, per above. Continue to monitor.   5. HLD w/ LDL Goal < 70 mg/dlL: LDL controlled w/ pravastatin and Zetia at 55 mg/dl.   6. Elevated Troponin: 0.08>>0.23. Currently CP free. Recommend holding coumadin and starting IV heparin once INR < 2.0 and plan for cath as outlined above.   7. Preoperative Assessment: plans for for carotid artery intervention. He denies recent CP but has had some exertional dyspnea and found to have new severe LV dysfunction and akinetic myocardium, concerning for underlying coronary ischemia. Would recommend cardiac catheterization prior to surgery.   MD to follow with further recs.   For questions or updates, please contact [place] Please consult [email or web address] for contact info under     Signed, Lyda Jester, PA-C  07/26/2018 9:24 AM   Patient seen and examined with the above-signed Advanced Practice Provider and/or Housestaff. I personally reviewed laboratory data, imaging studies and relevant notes. I independently examined the patient and formulated the important aspects of the plan. I have edited the note to reflect any of my changes or salient points. I have personally discussed the plan with the patient and/or family.  78 y/o male with h/o NICM, non-obstructive CAD, PAD, chronic AF whom we are asked to see  for CHF.  Says he  has been feeling bad for 7-8 months but has been quite depressed over the death of his daughter and has not sought much help. Reports increasing SOB, orthopnea and ab distension. Admitted with TIA and found to have high-grade R carotid stenosis with recommendation for CEA. However exam notable for HF. Echo done shows EF 20-25% with AK of anterior wall, apex, most of inferior wall  On exam General:  Ill appearing. Tachypneic in 30s HEENT: normal Neck: supple. nJVP to jaw Carotids 2+ bilat; no bruits. No lymphadenopathy or thryomegaly appreciated. Cor: PMI nondisplaced. Irregular rate & rhythm. No rubs, gallops or murmurs. Lungs: cc-rackles at bases Abdomen: soft, nontender, + distended. No hepatosplenomegaly. No bruits or masses. Good bowel sounds. Extremities: no cyanosis, clubbing, rash, 1+ edema cool  Neuro: alert & orientedx3, cranial nerves grossly intact. moves all 4 extremities w/o difficulty. Affect pleasant  He has severely decompensated HF with newly reduced EF and probably low output. Has significant volume overload. Will move to SDU. Increase lasix. Place PICC to check CVP and co-ox. Will likely need milrinone. Based on echo suspect LV dysfunction is ischemic in nature. Plan R/L cath when INR down. Hold warfarin. Start heparin (no bolus when INR < 2.0). He will not be ready for surgery any time soon.   Glori Bickers, MD  5:16 PM

## 2018-07-26 NOTE — Progress Notes (Signed)
RT NOTES: Changed patient from nonrebreather to 45% venturi mask d/t sats of 100%. Will continue to monitor.

## 2018-07-26 NOTE — Progress Notes (Signed)
[place] for warfarin Indication: atrial fibrillation  Allergies  Allergen Reactions  . Penicillins Hives    Blisters on hands  . Statins     Joint and muscle pain and low energy.    Patient Measurements: Height: 5\' 6"  (167.6 cm) Weight: 169 lb 12.1 oz (77 kg) IBW/kg (Calculated) : 63.8  Vital Signs: Temp: 97.6 F (36.4 C) (06/01 0327) Temp Source: Oral (06/01 0327) BP: 143/99 (06/01 0810) Pulse Rate: 93 (06/01 0933)  Labs: Recent Labs    07/24/18 1508 07/25/18 0443 07/26/18 0800  HGB 12.3*  --   --   HCT 39.4  --   --   PLT 386  --   --   APTT 37*  --   --   LABPROT 35.5* 29.1* 27.6*  INR 3.6* 2.8* 2.6*  CREATININE 1.22  --   --     Estimated Creatinine Clearance: 48 mL/min (by C-G formula based on SCr of 1.22 mg/dL).   Assessment: 78 yo male her with AMS and TIA. He is on warfarin PTA for afib and pharmacy was consulted to dose -INR= 2.6 today  Home warfarin dose:  7.5mg  MWThSa (last clinic visit was 5//28/20; INR was 2.6)   Goal of Therapy:  INR 2-3 Monitor platelets by anticoagulation protocol: Yes   Plan:  -Warfarin 2.5 mg po x 1 -Daily PT/INR  Levester Fresh, PharmD, BCPS, BCCCP Clinical Pharmacist [phone]  Please check AMION for all [place] numbers  07/26/2018 10:23 AM

## 2018-07-26 NOTE — Progress Notes (Addendum)
STROKE TEAM PROGRESS NOTE   SUBJECTIVE (INTERVAL HISTORY) No family is at the bedside.  Patient sitting in bed with nonrebreather, and mild shortness of breath.  Cardiology consulted for CHF exacerbation.  OBJECTIVE Vitals:   07/26/18 0933 07/26/18 1210 07/26/18 1547 07/26/18 1601  BP:  (!) 146/83 (!) 152/93   Pulse: 93 96 96   Resp:  (!) 27 (!) 29   Temp:  (!) 97.2 F (36.2 C) 97.9 F (36.6 C)   TempSrc:  Oral Oral   SpO2:  99% 98% 95%  Weight:      Height:        CBC:  Recent Labs  Lab 07/21/18 1754 07/24/18 1508 07/26/18 1008  WBC 7.2 8.5 11.0*  NEUTROABS 5.2 6.3  --   HGB 12.8* 12.3* 13.3  HCT 41.8 39.4 41.7  MCV 87.8 86.6 83.1  PLT 374 386 452*    Basic Metabolic Panel:  Recent Labs  Lab 07/24/18 1508 07/26/18 1008  NA 137 136  K 3.9 3.8  CL 100 100  CO2 25 21*  GLUCOSE 68* 153*  BUN 27* 25*  CREATININE 1.22 1.23  CALCIUM 9.0 9.1    Lipid Panel:     Component Value Date/Time   CHOL 95 07/25/2018 0443   TRIG 113 07/25/2018 0443   HDL 17 (L) 07/25/2018 0443   CHOLHDL 5.6 07/25/2018 0443   VLDL 23 07/25/2018 0443   LDLCALC 55 07/25/2018 0443   HgbA1c:  Lab Results  Component Value Date   HGBA1C 6.9 (H) 07/25/2018   Urine Drug Screen:     Component Value Date/Time   LABOPIA NONE DETECTED 07/24/2018 1451   COCAINSCRNUR NONE DETECTED 07/24/2018 1451   LABBENZ NONE DETECTED 07/24/2018 1451   AMPHETMU NONE DETECTED 07/24/2018 1451   THCU NONE DETECTED 07/24/2018 1451   LABBARB NONE DETECTED 07/24/2018 1451    Alcohol Level     Component Value Date/Time   ETH <10 07/24/2018 1508    IMAGING  Ct Angio Head W Or Wo Contrast Ct Angio Neck W And/or Wo Contrast 07/24/2018 IMPRESSION:  1. No emergent finding.  2. Severe atheromatous narrowing at the right ICA origin to the degree that the lumen is not measurable.  3. Left carotid endarterectomy with ~40% stenosis at the proximal anastomosis.  4. Moderate atheromatous narrowing at the left  vertebral origin.  5. Upper thoracic adenopathy that is partially covered. Recommend dedicated chest CT after convalescence.  6. Layering pleural effusions on both sides.   Ct Head Wo Contrast 07/24/2018 IMPRESSION:  Atrophy, chronic microvascular disease. No acute intracranial abnormality.   Mr Brain Wo Contrast 07/25/2018 IMPRESSION:  No acute or subacute finding by MRI. Mild to moderate chronic small-vessel ischemic changes of the cerebral hemispheric white matter.   Dg Chest Portable 1 View 07/24/2018 IMPRESSION:  No change in congestive heart failure with associated small bilateral pleural effusions.   Vas US Carotid  07/25/2018 Summary:  Right Carotid: Velocities in the right ICA are consistent with a 40-59% stenosis.  Left Carotid: Velocities in the left ICA are consistent with a 1-39% stenosis.  Vertebrals: Bilateral vertebral arteries demonstrate antegrade flow.   Transthoracic Echocardiogram  00/00/2020 Pending  EKG - atrial fibrillation - ventricular response 84 BPM (See cardiology reading for complete details)    PHYSICAL EXAM  Temp:  [97.2 F (36.2 C)-98.2 F (36.8 C)] 97.9 F (36.6 C) (06/01 1547) Pulse Rate:  [77-111] 96 (06/01 1547) Resp:  [21-29] 29 (06/01 1547) BP: (137-157)/(80-99) 152/93 (06/01  1547) SpO2:  [95 %-99 %] 95 % (06/01 1601) FiO2 (%):  [45 %] 45 % (06/01 1601)  General - Well nourished, well developed, mild respiratory distress on nonrebreather.  Ophthalmologic - fundi not visualized due to noncooperation.  Cardiovascular - irregularly irregular heart rate and rhythm.  Mental Status -  Level of arousal and orientation to time, place, and person were intact. Language including expression, naming, repetition, comprehension was assessed and found intact.  Dyspnea on speaking. Fund of Knowledge was assessed and was intact.  Cranial Nerves II - XII - II - Visual field intact OU. III, IV, VI - Extraocular movements intact. V - Facial  sensation intact bilaterally. VII - Facial movement intact bilaterally. VIII - Hearing & vestibular intact bilaterally. X - Palate elevates symmetrically. XI - Chin turning & shoulder shrug intact bilaterally. XII - Tongue protrusion intact.  Motor Strength - The patient's strength was normal in all extremities and pronator drift was absent.  Bulk was normal and fasciculations were absent.   Motor Tone - Muscle tone was assessed at the neck and appendages and was normal.  Reflexes - The patient's reflexes were symmetrical in all extremities and he had no pathological reflexes.  Sensory - Light touch, temperature/pinprick were assessed and were symmetrical.    Coordination - The patient had normal movements in the hands and feet with no ataxia or dysmetria.  Tremor was absent.  Gait and Station - deferred.   ASSESSMENT/PLAN Mr. Oscar [name] is a 78 y.o. male with history of carotid artery disease, CEA, CHF, COPD, CAD, DM, Hld, A. fib and cardiomyopathy currently anticoagulated with a therapeutic INR presenting with transient slurred speech and trouble with his balance that resolved completely. He did not receive IV t-PA due to resolution of deficits and anticoagulation.  TIA - can not rule out related to right ICA high grade stenosis    Resultant  Back to baseline  CT head - Atrophy, chronic microvascular disease. No acute intracranial abnormality.   MRI head - No acute or subacute finding by MRI.   CTA H&N - Severe atheromatous narrowing at the right ICA origin to the degree that the lumen is not measurable.   Carotid Doppler  - right ICA 40-59% stenosis  2D Echo -EF 25 to 30%  [employer] Virus 2  - negative  LDL - 55  HgbA1c - 6.9  UDS  - negative  VTE prophylaxis - warfarin  Diet - Heart healthy / carb modified with thin liquids.  warfarin daily prior to admission, now on warfarin daily. Recommend to add ASA 81 on top of coumadin due to carotid atherosclerosis.    Patient counseled to be compliant with his antithrombotic medications  Ongoing aggressive stroke risk factor management  Therapy recommendations:  none  Disposition:  Pending  Carotid stenosis  Left CEA done with Dr. Donnetta Hutching in the past  CTA neck showed right ICA/CCA high grade stenosis and left ICA 40% stenosis  CUS right ICA 40-59% stenosis  Dr. Donnetta Hutching is on board for right CEA once medically stabilized  Recommend lovenox bridge to coumadin if appropriate perioperatively.   BP goal 130-150 prior to procedure  Afib, chronic  On coumadin PTA  INR on admission 2.8->2.6  Rate controlled  Continue coumadin   Perioperatively, recommend lovenox bridge for coumadin if appropriate.  CHF  08/2017 EF 65 to 70%  On this admission EF 25 to 30%  Cardiology consulted, plan for cardiac cath tomorrow  Continue Coumadin with INR  goal 2-3  On digoxin  Hypertension  Stable . Long-term BP goal 130-150 given carotid stenosis  Hyperlipidemia  Lipid lowering medication PTA: Pravachol 80 mg daily.  LDL 55, goal < 70  Current lipid lowering medication: Zetia 10 mg daily, Fenofibrate 160 mg daily and pravastatin 80 mg daily  Continue statin at discharge  Diabetes  HgbA1c 6.9, goal < 7.0  Controlled  SSI  CBG monitoring  Other Stroke Risk Factors  Advanced age  Former cigarette smoker - quit 18 years ago.  Coronary artery disease  Other Active Problems  Upper thoracic adenopathy that is partially covered. Recommend dedicated chest CT after convalescence.    Hospital day # 1  Neurology will sign off. Please call with questions. Pt will follow up with Dr. Krista Blue at [facility] in about 4 weeks. Thanks for the consult.  Rosalin Hawking, MD PhD Stroke Neurology 07/26/2018 5:16 PM    To contact Stroke Continuity provider, please refer to [email or web address] After hours, contact General Neurology

## 2018-07-27 ENCOUNTER — Inpatient Hospital Stay: Payer: Self-pay

## 2018-07-27 ENCOUNTER — Inpatient Hospital Stay (HOSPITAL_COMMUNITY): Payer: Medicare Other

## 2018-07-27 ENCOUNTER — Encounter (HOSPITAL_COMMUNITY)
Admission: EM | Disposition: A | Discharge status: Home / Self Care | Payer: Self-pay | Source: Home / Self Care | Attending: Internal Medicine

## 2018-07-27 DIAGNOSIS — I5021 Acute systolic (congestive) heart failure: Secondary | ICD-10-CM

## 2018-07-27 DIAGNOSIS — R570 Cardiogenic shock: Secondary | ICD-10-CM

## 2018-07-27 DIAGNOSIS — J9601 Acute respiratory failure with hypoxia: Secondary | ICD-10-CM

## 2018-07-27 LAB — GLUCOSE, CAPILLARY
Glucose-Capillary: 136 mg/dL — ABNORMAL HIGH (ref 70–99)
Glucose-Capillary: 219 mg/dL — ABNORMAL HIGH (ref 70–99)
Glucose-Capillary: 255 mg/dL — ABNORMAL HIGH (ref 70–99)
Glucose-Capillary: 180 mg/dL — ABNORMAL HIGH (ref 70–99)

## 2018-07-27 LAB — COOXEMETRY PANEL
Carboxyhemoglobin: 1.5 % (ref 0.5–1.5)
Methemoglobin: 1.4 % (ref 0.0–1.5)
O2 Saturation: 47.4 %
Total hemoglobin: 13.7 g/dL (ref 12.0–16.0)

## 2018-07-27 LAB — CBC
HCT: 41.9 % (ref 39.0–52.0)
Hemoglobin: 13.7 g/dL (ref 13.0–17.0)
MCH: 27 pg (ref 26.0–34.0)
MCHC: 32.7 g/dL (ref 30.0–36.0)
MCV: 82.5 fL (ref 80.0–100.0)
Platelets: 475 10*3/uL — ABNORMAL HIGH (ref 150–400)
RBC: 5.08 MIL/uL (ref 4.22–5.81)
RDW: 16.4 % — ABNORMAL HIGH (ref 11.5–15.5)
WBC: 10.9 10*3/uL — ABNORMAL HIGH (ref 4.0–10.5)
nRBC: 0 % (ref 0.0–0.2)

## 2018-07-27 LAB — BASIC METABOLIC PANEL
Anion gap: 15 (ref 5–15)
BUN: 32 mg/dL — ABNORMAL HIGH (ref 8–23)
CO2: 25 mmol/L (ref 22–32)
Calcium: 8.7 mg/dL — ABNORMAL LOW (ref 8.9–10.3)
Chloride: 93 mmol/L — ABNORMAL LOW (ref 98–111)
Creatinine, Ser: 1.37 mg/dL — ABNORMAL HIGH (ref 0.61–1.24)
GFR calc Af Amer: 56 mL/min — ABNORMAL LOW (ref >60)
GFR calc non Af Amer: 49 mL/min — ABNORMAL LOW (ref >60)
Glucose, Bld: 213 mg/dL — ABNORMAL HIGH (ref 70–99)
Potassium: 3.4 mmol/L — ABNORMAL LOW (ref 3.5–5.1)
Sodium: 133 mmol/L — ABNORMAL LOW (ref 135–145)

## 2018-07-27 LAB — PROTIME-INR
INR: 2.7 — ABNORMAL HIGH (ref 0.8–1.2)
Prothrombin Time: 28.4 seconds — ABNORMAL HIGH (ref 11.4–15.2)

## 2018-07-27 LAB — TROPONIN I: Troponin I: 0.35 ng/mL (ref <0.03)

## 2018-07-27 SURGERY — RIGHT/LEFT HEART CATH AND CORONARY ANGIOGRAPHY
Anesthesia: LOCAL

## 2018-07-27 MED ORDER — SODIUM CHLORIDE 0.9% FLUSH: 10.0000 mL | INTRAVENOUS | Status: DC | PRN

## 2018-07-27 MED ORDER — SACUBITRIL-VALSARTAN 24-26 MG PO TABS
1.0000 | ORAL_TABLET | Freq: Two times a day (BID) | ORAL | Status: DC
  Administered 2018-07-27 – 2018-08-02 (×12): 1 via ORAL
  Filled 2018-07-27 (×13): qty 1

## 2018-07-27 MED ORDER — FUROSEMIDE 10 MG/ML IJ SOLN
80.0000 mg | Freq: Two times a day (BID) | INTRAMUSCULAR | Status: DC
  Administered 2018-07-27 – 2018-07-28 (×2): 80 mg via INTRAVENOUS
  Filled 2018-07-27 (×2): qty 8

## 2018-07-27 MED ORDER — SODIUM CHLORIDE 0.9% FLUSH
10.0000 mL | Freq: Two times a day (BID) | INTRAVENOUS | Status: DC
  Administered 2018-07-27 – 2018-07-29 (×5): 10 mL
  Administered 2018-07-30: 20 mL
  Administered 2018-08-01 – 2018-08-02 (×2): 10 mL

## 2018-07-27 MED ORDER — POTASSIUM CHLORIDE CRYS ER 20 MEQ PO TBCR
40.0000 meq | EXTENDED_RELEASE_TABLET | Freq: Once | ORAL | Status: AC
  Administered 2018-07-27: 40 meq via ORAL
  Filled 2018-07-27: qty 2

## 2018-07-27 MED ORDER — SPIRONOLACTONE 12.5 MG HALF TABLET
12.5000 mg | ORAL_TABLET | Freq: Every day | ORAL | Status: DC
  Administered 2018-07-27 – 2018-07-31 (×5): 12.5 mg via ORAL
  Filled 2018-07-27 (×5): qty 1

## 2018-07-27 NOTE — Care Management (Signed)
CM acknowledges consult for cost of welchol.  Pt has active insurance and informed CM that his copay of $50 even for generic.  Pt informed CM that he has not taken drug in months due to cost.  CM discussed with attending pts inability to pay copay - per attending medication is no longer needed based on current labs and medication will be removed for home list.  CM will continue to follow for discharge needs

## 2018-07-27 NOTE — Progress Notes (Signed)
Peripherally Inserted Central Catheter/Midline Placement  The IV Nurse has discussed with the patient and/or persons authorized to consent for the patient, the purpose of this procedure and the potential benefits and risks involved with this procedure.  The benefits include less needle sticks, lab draws from the catheter, and the patient may be discharged home with the catheter. Risks include, but not limited to, infection, bleeding, blood clot (thrombus formation), and puncture of an artery; nerve damage and irregular heartbeat and possibility to perform a PICC exchange if needed/ordered by physician.  Alternatives to this procedure were also discussed.  Bard Power PICC patient education guide, fact sheet on infection prevention and patient information card has been provided to patient /or left at bedside.    PICC/Midline Placement Documentation  PICC Double Lumen 07/27/18 PICC Right Brachial 40 cm 0 cm (Active)  Indication for Insertion or Continuance of Line Prolonged intravenous therapies 07/27/2018 12:00 PM  Exposed Catheter (cm) 0 cm 07/27/2018 12:00 PM  Site Assessment Clean;Dry;Intact 07/27/2018 12:00 PM  Lumen #1 Status Flushed;Saline locked;Blood return noted 07/27/2018 12:00 PM  Lumen #2 Status Flushed;Saline locked;Blood return noted 07/27/2018 12:00 PM  Dressing Type Transparent;Securing device 07/27/2018 12:00 PM  Dressing Status Clean;Dry;Intact;Antimicrobial disc in place 07/27/2018 12:00 PM  Line Care Connections checked and tightened 07/27/2018 12:00 PM  Dressing Intervention New dressing 07/27/2018 12:00 PM  Dressing Change Due 08/03/18 07/27/2018 12:00 PM       Virgilio Belling 07/27/2018, 12:41 PM

## 2018-07-27 NOTE — Progress Notes (Signed)
Occupational Therapy Treatment Patient Details Name: Oscar [name] MRN: [id number] DOB: [birth year] Today's Date: 07/27/2018    History of present illness Pt is a 78 y/o male presenting to ED for evaluation of transient episode of AMS, slurred speech, balance dificulty, lasting approx 1 hr then resolving in ED. CT and MRI with no acute abnormalities. Pt transferred to step down on on 6/1 due to decline in respiratory status. Pt to have cardiac cath for clearance for rt carotid endarterectomy. PMH: anemia, afib, CHF, COPD, DM, tremors of the nervous system, L carotid endarterectomy.    OT comments  Pt is progressing well in ADL independence and activity tolerance. Performed dressing with set up and grooming in standing at sink with supervision for safety and lines. Will continue to follow.  Follow Up Recommendations  No OT follow up;Supervision - Intermittent    Equipment Recommendations  None recommended by OT    Recommendations for Other Services      Precautions / Restrictions Restrictions Weight Bearing Restrictions: No       Mobility Bed Mobility Overal bed mobility: Modified Independent                Transfers Overall transfer level: Modified independent Equipment used: None               Balance Overall balance assessment: Mild deficits observed, not formally tested                                         ADL either performed or assessed with clinical judgement   ADL Overall ADL's : Needs assistance/impaired Eating/Feeding: Independent;Sitting   Grooming: Oral care;Standing;Supervision/safety           Upper Body Dressing : Set up;Sitting   Lower Body Dressing: Set up;Sitting/lateral leans               Functional mobility during ADLs: Supervision/safety       Vision   Vision Assessment?: No apparent visual deficits   Perception     Praxis      Cognition Arousal/Alertness: Awake/alert Behavior During Therapy:  WFL for tasks assessed/performed Overall Cognitive Status: Within Functional Limits for tasks assessed                                          Exercises     Shoulder Instructions       General Comments      Pertinent Vitals/ Pain       Pain Assessment: No/denies pain  Home Living                                          Prior Functioning/Environment              Frequency  Min 2X/week        Progress Toward Goals  OT Goals(current goals can now be found in the care plan section)  Progress towards OT goals: Progressing toward goals  Acute Rehab OT Goals Patient Stated Goal: to return home OT Goal Formulation: With patient Time For Goal Achievement: 08/08/18 Potential to Achieve Goals: Good  Plan Discharge plan remains appropriate    Co-evaluation  AM-PAC OT "6 Clicks" Daily Activity     Outcome Measure   Help from another person eating meals?: None Help from another person taking care of personal grooming?: A Little Help from another person toileting, which includes using toliet, bedpan, or urinal?: A Little Help from another person bathing (including washing, rinsing, drying)?: A Little Help from another person to put on and taking off regular upper body clothing?: None Help from another person to put on and taking off regular lower body clothing?: A Little 6 Click Score: 20    End of Session Equipment Utilized During Treatment: Gait belt;Oxygen  OT Visit Diagnosis: Unsteadiness on feet (R26.81);Other (comment)(decreased activity tolerance)   Activity Tolerance Patient tolerated treatment well   Patient Left in bed;with call bell/phone within reach;with bed alarm set   Nurse Communication          Time: 3748-2707 OT Time Calculation (min): 26 min  Charges: OT General Charges $OT Visit: 1 Visit OT Treatments $Self Care/Home Management : 23-37 mins  Nestor Lewandowsky, OTR/L Acute  Rehabilitation Services Pager: [phone] Office: [phone]   Malka So 07/27/2018, 2:19 PM

## 2018-07-27 NOTE — Progress Notes (Signed)
Physical Therapy Treatment Patient Details Name: Oscar [name] MRN: [id number] DOB: 1940/04/15 Today's Date: 07/27/2018    History of Present Illness Pt is a 78 y/o male presenting to ED for evaluation of transient episode of AMS, slurred speech, balance dificulty, lasting approx 1 hr then resolving in ED. CT and MRI with no acute abnormalities. Pt transferred to step down on on 6/1 due to decline in respiratory status. Pt to have cardiac cath for clearance for rt carotid endarterectomy. PMH: anemia, afib, CHF, COPD, DM, tremors of the nervous system, L carotid endarterectomy.     PT Comments    Pt progressing toward goals. Will continue to follow as pt undergoes further tests and medical procedures to maximize independence to allow return home.    Follow Up Recommendations  No PT follow up     Equipment Recommendations  None recommended by PT    Recommendations for Other Services       Precautions / Restrictions Restrictions Weight Bearing Restrictions: No    Mobility  Bed Mobility Overal bed mobility: Modified Independent                Transfers Overall transfer level: Modified independent Equipment used: None Transfers: Sit to/from Stand Sit to Stand: Modified independent (Device/Increase time)            Ambulation/Gait Ambulation/Gait assistance: Supervision Gait Distance (Feet): 200 Feet Assistive device: None Gait Pattern/deviations: Step-through pattern;Decreased stride length;Drifts right/left Gait velocity: decreased Gait velocity interpretation: 1.31 - 2.62 ft/sec, indicative of limited community ambulator General Gait Details: slightly unsteady but no LOB. Pt amb on 2L with SpO2 >90%   Stairs             Wheelchair Mobility    Modified Rankin (Stroke Patients Only)       Balance Overall balance assessment: Mild deficits observed, not formally tested                                          Cognition  Arousal/Alertness: Awake/alert Behavior During Therapy: WFL for tasks assessed/performed Overall Cognitive Status: Within Functional Limits for tasks assessed                                        Exercises      General Comments        Pertinent Vitals/Pain      Home Living                      Prior Function            PT Goals (current goals can now be found in the care plan section) Progress towards PT goals: Progressing toward goals    Frequency    Min 3X/week      PT Plan Current plan remains appropriate    Co-evaluation              AM-PAC PT "6 Clicks" Mobility   Outcome Measure  Help needed turning from your back to your side while in a flat bed without using bedrails?: None Help needed moving from lying on your back to sitting on the side of a flat bed without using bedrails?: None Help needed moving to and from a bed to a chair (including a wheelchair)?: None Help  needed standing up from a chair using your arms (e.g., wheelchair or bedside chair)?: None Help needed to walk in hospital room?: A Little Help needed climbing 3-5 steps with a railing? : A Little 6 Click Score: 22    End of Session Equipment Utilized During Treatment: Oxygen Activity Tolerance: Patient tolerated treatment well Patient left: in bed;with call bell/phone within reach;Other (comment)(IV team to place PICC) Nurse Communication: Mobility status PT Visit Diagnosis: Unsteadiness on feet (R26.81)     Time: 2633-3545 PT Time Calculation (min) (ACUTE ONLY): 10 min  Charges:  $Gait Training: 8-22 mins                     [place] Pager [phone] Office [place] 07/27/2018, 1:16 PM

## 2018-07-27 NOTE — Progress Notes (Signed)
Patient refused PICC placement at this time. Patient want to speak with MD first. RN made aware.

## 2018-07-27 NOTE — Plan of Care (Signed)

## 2018-07-27 NOTE — Progress Notes (Signed)
Patient ID: Oscar [name], male   DOB: January 30, 1941, 78 y.o.   MRN: [id number] The patient was moved to stepdown yesterday evening due to his shortness of breath and respiratory status.  Much more comfortable this morning.  On nasal cannula with good saturations.  Understands plan for cardiac catheterization.  No new neurologic deficits.  Plan right carotid endarterectomy when cardiac and pulmonary status optimized

## 2018-07-27 NOTE — Progress Notes (Signed)
[place] for warfarin/heparin Indication: atrial fibrillation  Allergies  Allergen Reactions  . Penicillins Hives    Blisters on hands  . Statins     Joint and muscle pain and low energy.    Patient Measurements: Height: 5\' 6"  (167.6 cm) Weight: 161 lb 9.6 oz (73.3 kg) IBW/kg (Calculated) : 63.8  Vital Signs: Temp: 98.2 F (36.8 C) (06/02 0714) Temp Source: Oral (06/02 0714) BP: 169/120 (06/02 0800) Pulse Rate: 54 (06/02 0714)  Labs: Recent Labs    07/24/18 1508 07/25/18 0443 07/26/18 0800  07/26/18 1008 07/26/18 1229 07/26/18 1706 07/27/18 0021  HGB 12.3*  --   --   --  13.3  --   --  13.7  HCT 39.4  --   --   --  41.7  --   --  41.9  PLT 386  --   --   --  452*  --   --  475*  APTT 37*  --   --   --   --   --   --   --   LABPROT 35.5* 29.1* 27.6*  --   --   --   --  28.4*  INR 3.6* 2.8* 2.6*  --   --   --   --  2.7*  CREATININE 1.22  --   --   --  1.23  --   --  1.37*  TROPONINI  --   --   --    < > 0.08* 0.23* 0.36* 0.35*   < > = values in this interval not displayed.    Estimated Creatinine Clearance: 39.5 mL/min (A) (by C-G formula based on SCr of 1.37 mg/dL (H)).   Assessment: 78 yo male her with AMS and TIA. He is on warfarin PTA for afib and pharmacy was consulted to dose.   INR today remains therapeutic at 2.7, warfarin was not given yesterday.  Home warfarin dose:  7.5mg  MWThSa (last clinic visit was 5//28/20; INR was 2.6)   Goal of Therapy:  INR 2-3 Monitor platelets by anticoagulation protocol: Yes   Plan:  - Hold warfarin in anticipation for cardiac cath - Start heparin once INR < 2 - Daily PT/INR  Claiborne Billings, PharmD PGY2 Cardiology Pharmacy Resident Please check AMION for all Pharmacist numbers by unit 07/27/2018 10:21 AM

## 2018-07-27 NOTE — Progress Notes (Addendum)
PROGRESS NOTE    Oscar [name]  EXH:[id number] DOB: 1940-09-12 DOA: 07/24/2018 PCP: No primary care provider on file.   Brief Narrative:  On 07/24/2018 by Dr. Emeline Gins Elgergawy [name] a78 y.o.male,with past medical history of atrial fibrillation, on warfarin, COPD, on room air, CHF, diabetes mellitus, hyperlipidemia, patient presents to ED for evaluation for transient episode of altered mental status and slurred speech and balance difficulty, patient woke up at his usual state of health this morning, he was riding in the car with his family, brief episode where he could not remember his pain #1 he was pain for the gas, as well family noted him to have slurred speech, and he had to lean against the cart to hold himself, patient told his family he did not want to come to ED, patient was noted later to have difficulty walking going to bathroom, was no fever, chills, seizure activity, loss of consciousness noted during these episodes, no prior history of CVA. - In EDpatient reports her strength back to baseline, speech is clear, mentation is appropriate awake alert x4, his INR therapeutic at 3.6, he is showing A. fib on telemetry, he a head and neck significant for severe atheromatosis narrowing in the right ICA origin, left Endarterectomy with 40% stenosis at the proximal anastomosis, and layering pleural effusion.  Interim history Admitted with TIA like symptoms.  Found to have occlusion of the right internal carotid artery.  Also found to have new systolic CHF with an EF of 25 to 30%.  Experienced respiratory distress and being treated for heart failure.  Cardiology consulted and planning on cath.   Assessment & Plan   Acute respiratory failure -possibly secondary to CHF/cardiomyopathy -Continue supplemental oxygen -CXR obtained on 6/1 appears to be stable when compared to prior CXR -duonebs ordered PRN -ABG- 7.391/39.4/67.9/23.4 -IV lasix given  New sytolic heart  failure/cardiomyopathy -Echocardiogram showed EF 38-10%, LV diastolic parameters indeterminate.  Only base of left ventricle moves well.  RV moderately reduced systolic function. -Patient given IV Lasix today -Monitor intake and output, daily weights -Not appear to be overtly volume overloaded -Cardiology consulted and appreciated- plan for PICC line placement, Co-ox monitoring. Increase lasix dose, possible milrinone. Plan for R/L cath when INR below 2.  Chest tightness -Patient complains of chest tightness along with inability to breathe well -Treatment plan as above -EKG obtained showing A. fib with RVR -Troponin peaked at 0.36 -As above, cardiology consulted and appreciated  TIA -Patient presented with a brief episode of altered mental status, slurred speech, unsteady gait -CT head with no acute findings -CTA head and neck with significant right carotid artery narrowing -MRI showed no acute or subacute finding -Carotid Doppler: Bilateral ICA 1 to 39% stenosis bilateral vertebral arteries demonstrate antegrade flow -History of atrial fibrillation on anticoagulation with Coumadin -Echocardiogram pending  -LDL 55, Hemoglobin A1c 6.9 -Neurology consulted and appreciated, recommended vascular consultation  -Continue Coumadin and aspirin, statin -PT assessed patient and recommended no further therapy  Carotid artery stenosis -CTA head and neck showed significant stenosis R ICA -History of left carotid endarterectomy -Vascular surgery consulted and appreciated- Dr. Donnetta Hutching to evaluate patient. Patient will need cardiac assessment given new EF. Continue aspirin and coumadin for now. -Currently on coumadin and aspirin  Essential hypertension -Coreg, lisinopril, and lasix held -BP stable  Hyperlipidemia -Takes Zetia, fenofibrate, tatin  -lipid panel: total cholesterol 95, HDL 17, LDL 55, TG 113 -patient has not been on welchol in months  Diabetes mellitus, type II -Placed on  ISS and CBG montioring  -on glipizide, tradjenta, metformin at home  Atrial fibrillation, now with RVR -Suspect RVR secondary to respiratory status -CHADSVASC 8 -Coreg held to left permissive hypertension -Continue digoxin -INR 2.7 -Coumadin held, plan to start heparin when INR <2  Upper thoracic adenopathy -found on CTA head/neck -CT chest: Multiple enlarged medium sized mediastinal lymph nodes.  Differential would include reactive adenopathy versus lymphoproliferative process.  Recommended repeat CT chest, abdomen pelvis with contrast in 1 month.  DVT Prophylaxis  Coumadin --> heparin when INR <2  Code Status: Full  Family Communication: None at bedside  Disposition Plan:   Consultants Neurology Vascular Surgery Cardiology- CHF team  Procedures  Carotid doppler Echocardiogram   Antibiotics   Anti-infectives (From admission, onward)   None      Subjective:   Oscar [name] seen and examined today.  Patient feels that his breathing has mildly improved today.  Ready to have his catheterization done.  Denies current chest pain, abdominal pain, nausea or vomiting, diarrhea constipation, dizziness or headache.  Objective:   Vitals:   07/27/18 0300 07/27/18 0500 07/27/18 0714 07/27/18 0800  BP: 136/84  (!) 149/77 (!) 169/120  Pulse: 79  (!) 54   Resp: 20  (!) 21   Temp: 97.6 F (36.4 C)  98.2 F (36.8 C)   TempSrc: Oral  Oral   SpO2: 97%  94%   Weight:  73.3 kg    Height:        Intake/Output Summary (Last 24 hours) at 07/27/2018 0947 Last data filed at 07/27/2018 0809 Gross per 24 hour  Intake 256.58 ml  Output 3410 ml  Net -3153.42 ml   Filed Weights   07/24/18 1441 07/24/18 2110 07/27/18 0500  Weight: 76.2 kg 77 kg 73.3 kg   Exam  General: Well developed, well nourished, NAD, appears stated age  HEENT: NCAT, mucous membranes moist.   Neck: Supple  Cardiovascular: S1 S2 auscultated, SEM, IRR  Respiratory: diminished breath sounds, with exp  wheezing  Abdomen: Soft, nontender, nondistended, + bowel sounds  Extremities: warm dry without cyanosis clubbing. +LE edema B/L   Neuro: AAOx3, nonfocal  Psych: Appropriate mood and affect, pleasant     Data Reviewed: I have personally reviewed following labs and imaging studies  CBC: Recent Labs  Lab 07/21/18 1754 07/24/18 1508 07/26/18 1008 07/27/18 0021  WBC 7.2 8.5 11.0* 10.9*  NEUTROABS 5.2 6.3  --   --   HGB 12.8* 12.3* 13.3 13.7  HCT 41.8 39.4 41.7 41.9  MCV 87.8 86.6 83.1 82.5  PLT 374 386 452* 408*   Basic Metabolic Panel: Recent Labs  Lab 07/21/18 1754 07/24/18 1508 07/26/18 1008 07/27/18 0021  NA 141 137 136 133*  K 3.8 3.9 3.8 3.4*  CL 102 100 100 93*  CO2 27 25 21* 25  GLUCOSE 177* 68* 153* 213*  BUN 25* 27* 25* 32*  CREATININE 1.27* 1.22 1.23 1.37*  CALCIUM 9.8 9.0 9.1 8.7*   GFR: Estimated Creatinine Clearance: 39.5 mL/min (A) (by C-G formula based on SCr of 1.37 mg/dL (H)). Liver Function Tests: Recent Labs  Lab 07/24/18 1508  AST 30  ALT 20  ALKPHOS 33*  BILITOT 0.8  PROT 6.7  ALBUMIN 3.8   No results for input(s): LIPASE, AMYLASE in the last 168 hours. No results for input(s): AMMONIA in the last 168 hours. Coagulation Profile: Recent Labs  Lab 07/24/18 1508 07/25/18 0443 07/26/18 0800 07/27/18 0021  INR 3.6* 2.8* 2.6* 2.7*   Cardiac  Enzymes: Recent Labs  Lab 07/26/18 1008 07/26/18 1229 07/26/18 1706 07/27/18 0021  TROPONINI 0.08* 0.23* 0.36* 0.35*   BNP (last 3 results) No results for input(s): PROBNP in the last 8760 hours. HbA1C: Recent Labs    07/25/18 0443  HGBA1C 6.9*   CBG: Recent Labs  Lab 07/26/18 0609 07/26/18 1229 07/26/18 1603 07/26/18 2118 07/27/18 0752  GLUCAP 172* 147* 127* 244* 136*   Lipid Profile: Recent Labs    07/25/18 0443  CHOL 95  HDL 17*  LDLCALC 55  TRIG 113  CHOLHDL 5.6   Thyroid Function Tests: No results for input(s): TSH, T4TOTAL, FREET4, T3FREE, THYROIDAB in the  last 72 hours. Anemia Panel: No results for input(s): VITAMINB12, FOLATE, FERRITIN, TIBC, IRON, RETICCTPCT in the last 72 hours. Urine analysis:    Component Value Date/Time   COLORURINE YELLOW 07/24/2018 [place] 07/24/2018 1451   LABSPEC 1.009 07/24/2018 1451   PHURINE 6.0 07/24/2018 1451   GLUCOSEU NEGATIVE 07/24/2018 1451   [place] 07/24/2018 [place] 07/24/2018 [place] 07/24/2018 1451   PROTEINUR NEGATIVE 07/24/2018 1451   NITRITE NEGATIVE 07/24/2018 [place] 07/24/2018 1451   Sepsis Labs: @LABRCNTIP (procalcitonin:4,lacticidven:4)  ) Recent Results (from the past 240 hour(s))  SARS Coronavirus 2 (CEPHEID - Performed in [place] hospital lab), Hosp Order     Status: None   Collection Time: 07/24/18  4:48 PM  Result Value Ref Range Status   SARS Coronavirus 2 NEGATIVE NEGATIVE Final    Comment: (NOTE) If result is NEGATIVE SARS-CoV-2 target nucleic acids are NOT DETECTED. The SARS-CoV-2 RNA is generally detectable in upper and lower  respiratory specimens during the acute phase of infection. The lowest  concentration of SARS-CoV-2 viral copies this assay can detect is 250  copies / mL. A negative result does not preclude SARS-CoV-2 infection  and should not be used as the sole basis for treatment or other  patient management decisions.  A negative result may occur with  improper specimen collection / handling, submission of specimen other  than nasopharyngeal swab, presence of viral mutation(s) within the  areas targeted by this assay, and inadequate number of viral copies  (<250 copies / mL). A negative result must be combined with clinical  observations, patient history, and epidemiological information. If result is POSITIVE SARS-CoV-2 target nucleic acids are DETECTED. The SARS-CoV-2 RNA is generally detectable in upper and lower  respiratory specimens dur ing the acute phase of  infection.  Positive  results are indicative of active infection with SARS-CoV-2.  Clinical  correlation with patient history and other diagnostic information is  necessary to determine patient infection status.  Positive results do  not rule out bacterial infection or co-infection with other viruses. If result is PRESUMPTIVE POSTIVE SARS-CoV-2 nucleic acids MAY BE PRESENT.   A presumptive positive result was obtained on the submitted specimen  and confirmed on repeat testing.  While 2019 novel coronavirus  (SARS-CoV-2) nucleic acids may be present in the submitted sample  additional confirmatory testing may be necessary for epidemiological  and / or clinical management purposes  to differentiate between  SARS-CoV-2 and other Sarbecovirus currently known to infect humans.  If clinically indicated additional testing with an alternate test  methodology [phone]) is advised. The SARS-CoV-2 RNA is generally  detectable in upper and lower respiratory sp ecimens during the acute  phase of infection. The expected result is Negative. Fact Sheet for Patients:  StrictlyIdeas.no Fact  Sheet for Healthcare Providers: BankingDealers.co.za This test is not yet approved or cleared by the [place] and has been authorized for detection and/or diagnosis of SARS-CoV-2 by FDA under an Emergency Use Authorization (EUA).  This EUA will remain in effect (meaning this test can be used) for the duration of the COVID-19 declaration under Section 564(b)(1) of the Act, 21 U.S.C. section 360bbb-3(b)(1), unless the authorization is terminated or revoked sooner. Performed at [facility], [address], [place], [place] [address]       Radiology Studies: Dg Chest 2 View  Result Date: 07/26/2018 CLINICAL DATA:  Shortness of breath EXAM: CHEST - 2 VIEW COMPARISON:  07/24/2018 FINDINGS: Bilateral pleural effusions are noted similar to that seen on prior CT  examination. Central vascular congestion is again noted. Mild interstitial edema is noted. No bony abnormality is seen. IMPRESSION: Changes of CHF stable from the previous exam. Electronically Signed   By: Inez Catalina M.D.   On: 07/26/2018 08:31   Ct Chest Wo Contrast  Result Date: 07/25/2018 CLINICAL DATA:  Enlarged lymph nodes identified on neck CT. EXAM: CT CHEST WITHOUT CONTRAST TECHNIQUE: Multidetector CT imaging of the chest was performed following the standard protocol without IV contrast. COMPARISON:  Radiograph 07/24/2018 neck CT 07/24/2018 FINDINGS: Cardiovascular: Coronary artery calcification and aortic atherosclerotic calcification. Mediastinum/Nodes: Small bilateral axillary lymph nodes measure up to 12 mm short axis in the RIGHT axilla. No supraclavicular adenopathy. Multiple mildly enlarged paratracheal lymph nodes are present. Enlarged 16 mm RIGHT lower paratracheal lymph node. Similar 16 mm LEFT lower paratracheal lymph node. Subcarinal nodes measured 18 mm short axis. Lungs/Pleura: Moderate layering RIGHT effusion and small LEFT effusion. There is bibasilar passive atelectasis. Interlobular septal thickening within the lungs consistent with interstitial edema. Upper Abdomen: Limited view of the liver, kidneys, pancreas are unremarkable. Normal adrenal glands. Musculoskeletal: No aggressive osseous lesion. IMPRESSION: 1. Multiple enlarged medium-size mediastinal lymph nodes. Differential would include reactive adenopathy versus lymphoproliferative process (e.g. CLL). Favor lymphoproliferative process. Recommend clinical correlation and consider hematology oncology consult. At minimum recommend follow-up CT chest abdomen pelvis with contrast in 1 month. Include CT abdomen pelvis to evaluate for abdominal/pelvic lymphadenopathy. 2. Bilateral pleural effusions with interstitial edema consistent with congestive heart failure. 3. Coronary artery calcification and Aortic Atherosclerosis  (ICD10-I70.0). Electronically Signed   By: Suzy Bouchard M.D.   On: 07/25/2018 15:45   [place] Ekg Site Rite  Result Date: 07/27/2018 If Site Rite image not attached, placement could not be confirmed due to current cardiac rhythm.    Scheduled Meds: . aspirin EC  81 mg Oral Daily  . digoxin  0.125 mg Oral Daily  . ezetimibe  10 mg Oral Daily  . fenofibrate  160 mg Oral Daily  . insulin aspart  0-9 Units Subcutaneous TID WC  . multivitamin with minerals  1 tablet Oral Daily  . omega-3 acid ethyl esters  2 g Oral BID  . pravastatin  80 mg Oral Daily  . sodium chloride flush  3 mL Intravenous Q12H   Continuous Infusions: . sodium chloride    . sodium chloride 10 mL/hr at 07/27/18 0550     LOS: 2 days   Time Spent in minutes   45 minutes   Zhuri Krass D.O. on 07/27/2018 at 9:47 AM  Between 7am to 7pm - Please see pager noted on [email or web address]  After 7pm go to [email or web address]  And look for the night coverage person covering for me after hours  [facility] Office  [phone]  

## 2018-07-27 NOTE — Progress Notes (Addendum)
Advanced Heart Failure Rounding Note   Subjective:    Moved to SDU yesterday for advanced HF and respiratory distress. Lasix increased. Now diuresing well. Weight down 8 pounds overnight.   Breathing better but still SOB with minimal activity. No CP, orthopnea or PND.    Objective:   Weight Range:  Vital Signs:   Temp:  [97.6 F (36.4 C)-98.3 F (36.8 C)] 97.7 F (36.5 C) (06/02 1120) Pulse Rate:  [54-98] 54 (06/02 0714) Resp:  [18-33] 21 (06/02 0714) BP: (136-169)/(73-120) 150/73 (06/02 1120) SpO2:  [93 %-98 %] 94 % (06/02 0714) FiO2 (%):  [45 %] 45 % (06/01 1932) Weight:  [73.3 kg] 73.3 kg (06/02 0500) Last BM Date: 07/26/18  Weight change: Filed Weights   07/24/18 1441 07/24/18 2110 07/27/18 0500  Weight: 76.2 kg 77 kg 73.3 kg    Intake/Output:   Intake/Output Summary (Last 24 hours) at 07/27/2018 1500 Last data filed at 07/27/2018 1329 Gross per 24 hour  Intake 436.58 ml  Output 2635 ml  Net -2198.42 ml     Physical Exam: General:  Sitting up in bed. No resp difficulty HEENT: normal Neck: supple. JVP 13 . Carotids 2+ bilat; no bruits. No lymphadenopathy or thryomegaly appreciated. Cor: PMI nondisplaced. irregular rate & rhythm. No rubs, gallops or murmurs. Lungs: clear Abdomen: soft, nontender, nondistended. No hepatosplenomegaly. No bruits or masses. Good bowel sounds. Extremities: no cyanosis, clubbing, rash, edema Neuro: alert & orientedx3, cranial nerves grossly intact. moves all 4 extremities w/o difficulty. Affect pleasant  Telemetry: AF 50-60 Personally reviewed   Labs: Basic Metabolic Panel: Recent Labs  Lab 07/21/18 1754 07/24/18 1508 07/26/18 1008 07/27/18 0021  NA 141 137 136 133*  K 3.8 3.9 3.8 3.4*  CL 102 100 100 93*  CO2 27 25 21* 25  GLUCOSE 177* 68* 153* 213*  BUN 25* 27* 25* 32*  CREATININE 1.27* 1.22 1.23 1.37*  CALCIUM 9.8 9.0 9.1 8.7*    Liver Function Tests: Recent Labs  Lab 07/24/18 1508  AST 30  ALT 20   ALKPHOS 33*  BILITOT 0.8  PROT 6.7  ALBUMIN 3.8   No results for input(s): LIPASE, AMYLASE in the last 168 hours. No results for input(s): AMMONIA in the last 168 hours.  CBC: Recent Labs  Lab 07/21/18 1754 07/24/18 1508 07/26/18 1008 07/27/18 0021  WBC 7.2 8.5 11.0* 10.9*  NEUTROABS 5.2 6.3  --   --   HGB 12.8* 12.3* 13.3 13.7  HCT 41.8 39.4 41.7 41.9  MCV 87.8 86.6 83.1 82.5  PLT 374 386 452* 475*    Cardiac Enzymes: Recent Labs  Lab 07/26/18 1008 07/26/18 1229 07/26/18 1706 07/27/18 0021  TROPONINI 0.08* 0.23* 0.36* 0.35*    BNP: BNP (last 3 results) Recent Labs    07/21/18 1754  BNP 2,433.0*    ProBNP (last 3 results) No results for input(s): PROBNP in the last 8760 hours.    Other results:  Imaging: Dg Chest 2 View  Result Date: 07/26/2018 CLINICAL DATA:  Shortness of breath EXAM: CHEST - 2 VIEW COMPARISON:  07/24/2018 FINDINGS: Bilateral pleural effusions are noted similar to that seen on prior CT examination. Central vascular congestion is again noted. Mild interstitial edema is noted. No bony abnormality is seen. IMPRESSION: Changes of CHF stable from the previous exam. Electronically Signed   By: Inez Catalina M.D.   On: 07/26/2018 08:31   Ct Chest Wo Contrast  Result Date: 07/25/2018 CLINICAL DATA:  Enlarged lymph nodes identified on  neck CT. EXAM: CT CHEST WITHOUT CONTRAST TECHNIQUE: Multidetector CT imaging of the chest was performed following the standard protocol without IV contrast. COMPARISON:  Radiograph 07/24/2018 neck CT 07/24/2018 FINDINGS: Cardiovascular: Coronary artery calcification and aortic atherosclerotic calcification. Mediastinum/Nodes: Small bilateral axillary lymph nodes measure up to 12 mm short axis in the RIGHT axilla. No supraclavicular adenopathy. Multiple mildly enlarged paratracheal lymph nodes are present. Enlarged 16 mm RIGHT lower paratracheal lymph node. Similar 16 mm LEFT lower paratracheal lymph node. Subcarinal nodes  measured 18 mm short axis. Lungs/Pleura: Moderate layering RIGHT effusion and small LEFT effusion. There is bibasilar passive atelectasis. Interlobular septal thickening within the lungs consistent with interstitial edema. Upper Abdomen: Limited view of the liver, kidneys, pancreas are unremarkable. Normal adrenal glands. Musculoskeletal: No aggressive osseous lesion. IMPRESSION: 1. Multiple enlarged medium-size mediastinal lymph nodes. Differential would include reactive adenopathy versus lymphoproliferative process (e.g. CLL). Favor lymphoproliferative process. Recommend clinical correlation and consider hematology oncology consult. At minimum recommend follow-up CT chest abdomen pelvis with contrast in 1 month. Include CT abdomen pelvis to evaluate for abdominal/pelvic lymphadenopathy. 2. Bilateral pleural effusions with interstitial edema consistent with congestive heart failure. 3. Coronary artery calcification and Aortic Atherosclerosis (ICD10-I70.0). Electronically Signed   By: Suzy Bouchard M.D.   On: 07/25/2018 15:45   Dg Chest Port 1 View  Result Date: 07/27/2018 CLINICAL DATA:  Status post PICC line placement. EXAM: PORTABLE CHEST 1 VIEW COMPARISON:  PA and lateral chest 07/26/2018. FINDINGS: Tip of new PICC projects in the lower superior vena cava. Pulmonary edema seen on the comparison examination has resolved. There small bilateral pleural effusions and basilar atelectasis. Atherosclerosis and cardiomegaly noted. IMPRESSION: Tip of right PICC projects in the lower superior vena cava. Resolved pulmonary edema. Small bilateral pleural effusions basilar atelectasis. Electronically Signed   By: Inge Rise M.D.   On: 07/27/2018 13:19   [place] Ekg Site Rite  Result Date: 07/27/2018 If Site Rite image not attached, placement could not be confirmed due to current cardiac rhythm.  [place] Ekg Site Rite  Result Date: 07/27/2018 If Site Rite image not attached, placement could not be confirmed due to  current cardiac rhythm.     Medications:     Scheduled Medications: . aspirin EC  81 mg Oral Daily  . digoxin  0.125 mg Oral Daily  . ezetimibe  10 mg Oral Daily  . fenofibrate  160 mg Oral Daily  . furosemide  80 mg Intravenous BID  . insulin aspart  0-9 Units Subcutaneous TID WC  . multivitamin with minerals  1 tablet Oral Daily  . omega-3 acid ethyl esters  2 g Oral BID  . pravastatin  80 mg Oral Daily  . sodium chloride flush  10-40 mL Intracatheter Q12H  . sodium chloride flush  3 mL Intravenous Q12H     Infusions: . sodium chloride    . sodium chloride 10 mL/hr at 07/27/18 0550     PRN Medications:  sodium chloride, acetaminophen **OR** acetaminophen (TYLENOL) oral liquid 160 mg/5 mL **OR** acetaminophen, albuterol, calcium carbonate, hydrALAZINE, ipratropium-albuterol, senna-docusate, sodium chloride flush, sodium chloride flush   Assessment/Plan:   1. Acute systolic HF -> cardiogenic shock  - Echo this admit EF 20-25% with RWMA - Echo 7/19 EF 65% - Previous cath with non-obstructive CAD - He is improved with diuresis but still overloaded.  - PICC line placed today. CVP 13. Co-ox low at 47%  - Continue digoxin. Add spiro. No b-blocker yet with low output.  - Add Delene Loll  24/26. May need milrinone  - Plan R/L cath when INR down   2. Acute hypoxic respiratory failure - Improving with diuresis  3. Chronic AF - off warfarin for cath - start heparin when INR < 2.0 (no bolus) Discussed dosing with PharmD personally.  4. TIA with left carotid stenosis - VVS following for CEA when optimized  5. DM2 - per primary team   6. Hypokalemia - supp.start spiro  Length of Stay: 2   Glori Bickers MD 07/27/2018, 3:00 PM  Advanced Heart Failure Team Pager [phone] (M-F; 7a - 4p)  Please contact [facility] for night-coverage after hours (4p -7a ) and weekends on [email or web address]

## 2018-07-28 DIAGNOSIS — E876 Hypokalemia: Secondary | ICD-10-CM

## 2018-07-28 DIAGNOSIS — J9621 Acute and chronic respiratory failure with hypoxia: Secondary | ICD-10-CM

## 2018-07-28 LAB — CBC
HCT: 43.9 % (ref 39.0–52.0)
Hemoglobin: 14.1 g/dL (ref 13.0–17.0)
MCH: 26.6 pg (ref 26.0–34.0)
MCHC: 32.1 g/dL (ref 30.0–36.0)
MCV: 82.8 fL (ref 80.0–100.0)
Platelets: 443 10*3/uL — ABNORMAL HIGH (ref 150–400)
RBC: 5.3 MIL/uL (ref 4.22–5.81)
RDW: 16.5 % — ABNORMAL HIGH (ref 11.5–15.5)
WBC: 10.9 10*3/uL — ABNORMAL HIGH (ref 4.0–10.5)
nRBC: 0 % (ref 0.0–0.2)

## 2018-07-28 LAB — BASIC METABOLIC PANEL
Anion gap: 15 (ref 5–15)
BUN: 29 mg/dL — ABNORMAL HIGH (ref 8–23)
CO2: 31 mmol/L (ref 22–32)
Calcium: 8.7 mg/dL — ABNORMAL LOW (ref 8.9–10.3)
Chloride: 92 mmol/L — ABNORMAL LOW (ref 98–111)
Creatinine, Ser: 1.16 mg/dL (ref 0.61–1.24)
GFR calc Af Amer: >60 mL/min (ref >60)
GFR calc non Af Amer: 60 mL/min — ABNORMAL LOW (ref >60)
Glucose, Bld: 167 mg/dL — ABNORMAL HIGH (ref 70–99)
Potassium: 3 mmol/L — ABNORMAL LOW (ref 3.5–5.1)
Sodium: 138 mmol/L (ref 135–145)

## 2018-07-28 LAB — COOXEMETRY PANEL
Carboxyhemoglobin: 1.7 % — ABNORMAL HIGH (ref 0.5–1.5)
Methemoglobin: 0.8 % (ref 0.0–1.5)
O2 Saturation: 67.6 %
Total hemoglobin: 14.7 g/dL (ref 12.0–16.0)

## 2018-07-28 LAB — GLUCOSE, CAPILLARY
Glucose-Capillary: 157 mg/dL — ABNORMAL HIGH (ref 70–99)
Glucose-Capillary: 220 mg/dL — ABNORMAL HIGH (ref 70–99)
Glucose-Capillary: 280 mg/dL — ABNORMAL HIGH (ref 70–99)
Glucose-Capillary: 288 mg/dL — ABNORMAL HIGH (ref 70–99)

## 2018-07-28 LAB — PROTIME-INR
INR: 2.2 — ABNORMAL HIGH (ref 0.8–1.2)
Prothrombin Time: 24.2 seconds — ABNORMAL HIGH (ref 11.4–15.2)

## 2018-07-28 LAB — MAGNESIUM: Magnesium: 2.1 mg/dL (ref 1.7–2.4)

## 2018-07-28 MED ORDER — INSULIN ASPART 100 UNIT/ML ~~LOC~~ SOLN
0.0000 [IU] | Freq: Every day | SUBCUTANEOUS | Status: DC
  Administered 2018-07-28: 3 [IU] via SUBCUTANEOUS
  Administered 2018-07-29: 5 [IU] via SUBCUTANEOUS

## 2018-07-28 MED ORDER — POTASSIUM CHLORIDE CRYS ER 20 MEQ PO TBCR
40.0000 meq | EXTENDED_RELEASE_TABLET | ORAL | Status: AC
  Administered 2018-07-28 (×2): 40 meq via ORAL
  Filled 2018-07-28 (×2): qty 2

## 2018-07-28 MED ORDER — INSULIN ASPART 100 UNIT/ML ~~LOC~~ SOLN
0.0000 [IU] | Freq: Three times a day (TID) | SUBCUTANEOUS | Status: DC
  Administered 2018-07-28: 8 [IU] via SUBCUTANEOUS
  Administered 2018-07-29 – 2018-07-30 (×4): 3 [IU] via SUBCUTANEOUS
  Administered 2018-07-30: 8 [IU] via SUBCUTANEOUS
  Administered 2018-07-31: 5 [IU] via SUBCUTANEOUS
  Administered 2018-07-31: 2 [IU] via SUBCUTANEOUS
  Administered 2018-07-31: 5 [IU] via SUBCUTANEOUS
  Administered 2018-08-01: 8 [IU] via SUBCUTANEOUS
  Administered 2018-08-01: 5 [IU] via SUBCUTANEOUS
  Administered 2018-08-01: 07:00:00 2 [IU] via SUBCUTANEOUS
  Administered 2018-08-02: 12:00:00 3 [IU] via SUBCUTANEOUS
  Administered 2018-08-02: 2 [IU] via SUBCUTANEOUS

## 2018-07-28 MED ORDER — POTASSIUM CHLORIDE CRYS ER 20 MEQ PO TBCR
40.0000 meq | EXTENDED_RELEASE_TABLET | ORAL | Status: DC
  Administered 2018-07-28: 40 meq via ORAL
  Filled 2018-07-28: qty 2

## 2018-07-28 NOTE — Progress Notes (Signed)
Inpatient Diabetes Program Recommendations  AACE/ADA: New Consensus Statement on Inpatient Glycemic Control (2015)  Target Ranges:  Prepandial:   less than 140 mg/dL      Peak postprandial:   less than 180 mg/dL (1-2 hours)      Critically ill patients:  140 - 180 mg/dL   Results for Oscar [name], Oscar [name] (MRN [id number]) as of 07/28/2018 08:13  Ref. Range 07/27/2018 07:52 07/27/2018 11:43 07/27/2018 16:34 07/27/2018 21:16 07/28/2018 06:23  Glucose-Capillary Latest Ref Range: 70 - 99 mg/dL 136 (H) 219 (H) 255 (H) 180 (H) 157 (H)    Review of Glycemic Control  Diabetes history: DM 2 Outpatient Diabetes medications: Glipizide 10 mg bid, metformin 500 mg bid, Tradjenta 5 mg daily Current orders for Inpatient glycemic control: Novolog 0-9 units tid  A1c 6.9% on 5/31  Inpatient Diabetes Program Recommendations:    Patient on several oral DM meds at home. Glucose trends elevated throughout the day, may consider increasing correction scale to Novolog 0-15 units tid + HS coverage.  Thanks,  Tama Headings RN, MSN, BC-ADM Inpatient Diabetes Coordinator Team Pager [phone] (8a-5p)

## 2018-07-28 NOTE — Progress Notes (Signed)
PROGRESS NOTE   Oscar [name]  HYW:[id number]    DOB: 12-Oct-1940    DOA: 07/24/2018  PCP: No primary care provider on file.   I have briefly reviewed patients previous medical records in [facility].  Brief Narrative:  78 year old married male, PMH of permanent A. fib on warfarin, COPD on home oxygen 4 L/min at bedtime, chronic systolic CHF with subsequent normalization of EF, nonobstructive CAD, DM 2, HTN, HLD, carotid artery disease status post left CEA in 2007, presented to ED for evaluation of transient episode of AMS, slurred speech and balance difficulty.  He was admitted for suspected TIA and found to have occluded right ICA.  Hospital course complicated by acute new systolic CHF with EF 48-54% and acute respiratory failure with hypoxia.  Cardiology consulted.   Assessment & Plan:   Active Problems:   ATRIAL FIBRILLATION   History of left-sided carotid endarterectomy   Carotid artery stenosis   TIA (transient ischemic attack)   Acute on chronic respiratory failure with hypoxia -On home oxygen 4 L/min at bedtime. -Acute respiratory failure likely related to acute CHF -Acute respiratory failure seems to have resolved.  Currently saturating in the high 90s on room air.  New acute sytolic heart failure/cardiomyopathy/cardiogenic shock -Echocardiogram showed EF 62-70%, LV diastolic parameters indeterminate.  Only base of left ventricle moves well.  RV moderately reduced systolic function. -LVEF 65% on 7/19. -Previous cath with nonobstructive CAD. -Cardiology consultation and follow-up appreciated. -Currently on IV furosemide 80 mg twice daily. - -6.9 L since admission.  Weight down from 168 pounds to 153 pounds. -Continue digoxin, spironolactone and Entresto.  No beta-blockers due to low output. -Volume status has significantly improved but still appears mildly volume overloaded. - Plan for R/L cath when INR below 2 (2.2 today).  Patient states that he was told the cath  will be on Friday.  Chest tightness/elevated troponin -Suspect due to decompensated CHF. -Troponin peaked to 0.36 but plateaued.  Elevated troponin likely due to demand ischemia from A. fib with RVR and acute CHF. -Previous cath with nonobstructive CAD and cardiology plans to repeat cath this admission.  TIA -Patient presented with a brief episode of altered mental status, slurred speech, unsteady gait -CT head with no acute findings -CTA head and neck with significant right carotid artery narrowing -MRI showed no acute or subacute finding -Carotid Doppler:Bilateral ICA 1 to 39% stenosis bilateral vertebral arteriesdemonstrate antegrade flow -History of atrial fibrillation on anticoagulation with Coumadin -Echocardiogram results as above. -LDL55, Hemoglobin A1c6.9 -Neurology consulted and appreciated, recommended vascular consultation  -Continue aspirin, statin.  Coumadin held for potential cardiac cath. -PT assessed patient and recommended no further therapy  Carotid artery stenosis -CTA head and neck showed significant stenosis R ICA -History of left carotid endarterectomy -Vascular surgery follow-up from 6/2 appreciated.  Plans for right carotid endarterectomy when cardiac and pulmonary status optimized. -Continue aspirin and statins.  Essential hypertension -Controlled.  Carvedilol and lisinopril held. -Currently on Entresto, Aldactone and IV Lasix as above.  Hyperlipidemia -Takes Zetia, fenofibrate, pravastatin -lipid panel: total cholesterol 95, HDL 17, LDL 55, TG 113 -patient has not been on welchol in months  Diabetes mellitus, type II -CBGs mildly uncontrolled and fluctuating. -A1c 6.9.   -DM coordinator input appreciated, change SSI to moderate sensitivity along with bedtime scale. -on glipizide, tradjenta, metformin at home, currently on hold.  Chronic atrial fibrillation, now with RVR -Suspect RVR secondary to respiratory status -CHADSVASC 8 -Coreg  held to left permissive hypertension -Continue digoxin -  INR 2.2 -Coumadin held, plan to start heparin when INR <2 -Controlled ventricular rate.  Upper thoracic adenopathy -found on CTA head/neck -CT chest: Multiple enlarged medium sized mediastinal lymph nodes.  Differential would include reactive adenopathy versus lymphoproliferative process. - Recommended repeat CT chest, abdomen pelvis with contrast in 1 month.  NSVT Vs A. fib with aberrancy -Noted 28 beat wide complex nonsustained tachycardia on 6/2 at 3:07 AM. -Continue monitoring on telemetry. -Replace potassium to >4.  Magnesium 2.1. -Cardiology to consider starting beta-blockers.  Hypokalemia -Potassium 3.  Replace and follow.  Magnesium 2.1.   DVT prophylaxis: Coumadin held.  Start IV heparin when INR <2. Code Status: Full Family Communication: None at bedside Disposition: Determined pending further evaluation and management in the hospital by cardiology and vascular surgery as noted above.   Consultants:  Neurology Vascular Surgery Cardiology- CHF team  Procedures:  Carotid doppler Echocardiogram RUE PICC line  Antimicrobials:  None   Subjective: Patient reports that he feels much better.  Breathing has significantly improved and almost but not yet back to baseline.  States that he lost 8 pounds.  As per RN, no acute issues noted.  ROS: As above, otherwise negative.  Objective:  Vitals:   07/28/18 0317 07/28/18 0649 07/28/18 0748 07/28/18 1020  BP: 139/78  122/60   Pulse: 63  87 80  Resp: (!) 21  (!) 28   Temp: 98.1 F (36.7 C)  98 F (36.7 C)   TempSrc: Oral  Oral   SpO2: 97%  100%   Weight:  69.5 kg    Height:        Examination:  General exam: Pleasant elderly male, moderately built and nourished sitting up comfortably in chair this morning. Respiratory system: Occasional basal crackles but otherwise clear to auscultation. Cardiovascular system: S1 & S2 heard, irregularly irregular. No  JVD, murmurs, rubs, gallops or clicks. No pedal edema.  Telemetry personally reviewed: A. fib with controlled ventricular rate.  28 beats wide-complex tachycardia on 6/2 at 3:07 AM without recurrence. Gastrointestinal system: Abdomen is nondistended, soft and nontender. No organomegaly or masses felt. Normal bowel sounds heard. Central nervous system: Alert and oriented. No focal neurological deficits. Extremities: Symmetric 5 x 5 power. Skin: No rashes, lesions or ulcers Psychiatry: Judgement and insight appear normal. Mood & affect appropriate.     Data Reviewed: I have personally reviewed following labs and imaging studies  CBC: Recent Labs  Lab 07/21/18 1754 07/24/18 1508 07/26/18 1008 07/27/18 0021 07/28/18 0333  WBC 7.2 8.5 11.0* 10.9* 10.9*  NEUTROABS 5.2 6.3  --   --   --   HGB 12.8* 12.3* 13.3 13.7 14.1  HCT 41.8 39.4 41.7 41.9 43.9  MCV 87.8 86.6 83.1 82.5 82.8  PLT 374 386 452* 475* 606*   Basic Metabolic Panel: Recent Labs  Lab 07/21/18 1754 07/24/18 1508 07/26/18 1008 07/27/18 0021 07/28/18 0333  NA 141 137 136 133* 138  K 3.8 3.9 3.8 3.4* 3.0*  CL 102 100 100 93* 92*  CO2 27 25 21* 25 31  GLUCOSE 177* 68* 153* 213* 167*  BUN 25* 27* 25* 32* 29*  CREATININE 1.27* 1.22 1.23 1.37* 1.16  CALCIUM 9.8 9.0 9.1 8.7* 8.7*  MG  --   --   --   --  2.1   Liver Function Tests: Recent Labs  Lab 07/24/18 1508  AST 30  ALT 20  ALKPHOS 33*  BILITOT 0.8  PROT 6.7  ALBUMIN 3.8   Coagulation Profile: Recent Labs  Lab 07/24/18 1508 07/25/18 0443 07/26/18 0800 07/27/18 0021 07/28/18 0333  INR 3.6* 2.8* 2.6* 2.7* 2.2*   Cardiac Enzymes: Recent Labs  Lab 07/26/18 1008 07/26/18 1229 07/26/18 1706 07/27/18 0021  TROPONINI 0.08* 0.23* 0.36* 0.35*   HbA1C: No results for input(s): HGBA1C in the last 72 hours. CBG: Recent Labs  Lab 07/27/18 0752 07/27/18 1143 07/27/18 1634 07/27/18 2116 07/28/18 0623  GLUCAP 136* 219* 255* 180* 157*    Recent  Results (from the past 240 hour(s))  SARS Coronavirus 2 (CEPHEID - Performed in [place] hospital lab), Hosp Order     Status: None   Collection Time: 07/24/18  4:48 PM  Result Value Ref Range Status   SARS Coronavirus 2 NEGATIVE NEGATIVE Final    Comment: (NOTE) If result is NEGATIVE SARS-CoV-2 target nucleic acids are NOT DETECTED. The SARS-CoV-2 RNA is generally detectable in upper and lower  respiratory specimens during the acute phase of infection. The lowest  concentration of SARS-CoV-2 viral copies this assay can detect is 250  copies / mL. A negative result does not preclude SARS-CoV-2 infection  and should not be used as the sole basis for treatment or other  patient management decisions.  A negative result may occur with  improper specimen collection / handling, submission of specimen other  than nasopharyngeal swab, presence of viral mutation(s) within the  areas targeted by this assay, and inadequate number of viral copies  (<250 copies / mL). A negative result must be combined with clinical  observations, patient history, and epidemiological information. If result is POSITIVE SARS-CoV-2 target nucleic acids are DETECTED. The SARS-CoV-2 RNA is generally detectable in upper and lower  respiratory specimens dur ing the acute phase of infection.  Positive  results are indicative of active infection with SARS-CoV-2.  Clinical  correlation with patient history and other diagnostic information is  necessary to determine patient infection status.  Positive results do  not rule out bacterial infection or co-infection with other viruses. If result is PRESUMPTIVE POSTIVE SARS-CoV-2 nucleic acids MAY BE PRESENT.   A presumptive positive result was obtained on the submitted specimen  and confirmed on repeat testing.  While 2019 novel coronavirus  (SARS-CoV-2) nucleic acids may be present in the submitted sample  additional confirmatory testing may be necessary for epidemiological   and / or clinical management purposes  to differentiate between  SARS-CoV-2 and other Sarbecovirus currently known to infect humans.  If clinically indicated additional testing with an alternate test  methodology [phone]) is advised. The SARS-CoV-2 RNA is generally  detectable in upper and lower respiratory sp ecimens during the acute  phase of infection. The expected result is Negative. Fact Sheet for Patients:  StrictlyIdeas.no Fact Sheet for Healthcare Providers: BankingDealers.co.za This test is not yet approved or cleared by the [place] FDA and has been authorized for detection and/or diagnosis of SARS-CoV-2 by FDA under an Emergency Use Authorization (EUA).  This EUA will remain in effect (meaning this test can be used) for the duration of the COVID-19 declaration under Section 564(b)(1) of the Act, 21 U.S.C. section 360bbb-3(b)(1), unless the authorization is terminated or revoked sooner. Performed at [facility], [address], [place], [place] [address]          Radiology Studies: Dg Chest [facility] 1 View  Result Date: 07/27/2018 CLINICAL DATA:  Status post PICC line placement. EXAM: PORTABLE CHEST 1 VIEW COMPARISON:  PA and lateral chest 07/26/2018. FINDINGS: Tip of new PICC projects in the lower superior  vena cava. Pulmonary edema seen on the comparison examination has resolved. There small bilateral pleural effusions and basilar atelectasis. Atherosclerosis and cardiomegaly noted. IMPRESSION: Tip of right PICC projects in the lower superior vena cava. Resolved pulmonary edema. Small bilateral pleural effusions basilar atelectasis. Electronically Signed   By: Inge Rise M.D.   On: 07/27/2018 13:19   [place] Ekg Site Rite  Result Date: 07/27/2018 If Site Rite image not attached, placement could not be confirmed due to current cardiac rhythm.  [place] Ekg Site Rite  Result Date: 07/27/2018 If Site Rite image not attached,  placement could not be confirmed due to current cardiac rhythm.       Scheduled Meds: . aspirin EC  81 mg Oral Daily  . digoxin  0.125 mg Oral Daily  . ezetimibe  10 mg Oral Daily  . fenofibrate  160 mg Oral Daily  . furosemide  80 mg Intravenous BID  . insulin aspart  0-9 Units Subcutaneous TID WC  . multivitamin with minerals  1 tablet Oral Daily  . omega-3 acid ethyl esters  2 g Oral BID  . potassium chloride  40 mEq Oral Q4H  . pravastatin  80 mg Oral Daily  . sacubitril-valsartan  1 tablet Oral BID  . sodium chloride flush  10-40 mL Intracatheter Q12H  . sodium chloride flush  3 mL Intravenous Q12H  . spironolactone  12.5 mg Oral Daily   Continuous Infusions: . sodium chloride    . sodium chloride 10 mL/hr at 07/27/18 0550     LOS: 3 days     Vernell Leep, MD, FACP, [facility]. [facility]  To contact the attending provider between 7A-7P or the covering provider during after hours 7P-7A, please log into the web site [email or web address] and access using universal [place] password for that web site. If you do not have the password, please call the hospital operator.  07/28/2018, 11:46 AM

## 2018-07-28 NOTE — Plan of Care (Signed)
  Problem: Education: Goal: Knowledge of General Education information will improve Description: Including pain rating scale, medication(s)/side effects and non-pharmacologic comfort measures Outcome: Progressing   Problem: Health Behavior/Discharge Planning: Goal: Ability to manage health-related needs will improve Outcome: Progressing   Problem: Clinical Measurements: Goal: Ability to maintain clinical measurements within normal limits will improve Outcome: Progressing Goal: Will remain free from infection Outcome: Progressing Goal: Diagnostic test results will improve Outcome: Progressing Goal: Respiratory complications will improve Outcome: Progressing Goal: Cardiovascular complication will be avoided Outcome: Progressing   Problem: Activity: Goal: Risk for activity intolerance will decrease Outcome: Progressing   Problem: Nutrition: Goal: Adequate nutrition will be maintained Outcome: Progressing   Problem: Coping: Goal: Level of anxiety will decrease Outcome: Progressing   Problem: Elimination: Goal: Will not experience complications related to bowel motility Outcome: Progressing Goal: Will not experience complications related to urinary retention Outcome: Progressing   Problem: Pain Managment: Goal: General experience of comfort will improve Outcome: Progressing   Problem: Safety: Goal: Ability to remain free from injury will improve Outcome: Progressing   Problem: Skin Integrity: Goal: Risk for impaired skin integrity will decrease Outcome: Progressing   Problem: Education: Goal: Knowledge of disease or condition will improve Outcome: Progressing Goal: Knowledge of secondary prevention will improve Outcome: Progressing Goal: Knowledge of patient specific risk factors addressed and post discharge goals established will improve Outcome: Progressing   Problem: Health Behavior/Discharge Planning: Goal: Ability to manage health-related needs will  improve Outcome: Progressing   Problem: Ischemic Stroke/TIA Tissue Perfusion: Goal: Complications of ischemic stroke/TIA will be minimized Outcome: Progressing   

## 2018-07-28 NOTE — Progress Notes (Signed)
Advanced Heart Failure Rounding Note   Subjective:    Continues on IV lasix. Weight down 15 pounds. Denies CP, SOB, orthopnea or PND  Entresto started yesterday. BP improved. INR 2.2 today. Coumadin on hold for cath. No bleeding   Objective:   Weight Range:  Vital Signs:   Temp:  [97.6 F (36.4 C)-98.1 F (36.7 C)] 97.8 F (36.6 C) (06/03 1157) Pulse Rate:  [63-141] 104 (06/03 1157) Resp:  [21-28] 28 (06/03 0748) BP: (122-155)/(55-88) 122/55 (06/03 1157) SpO2:  [93 %-100 %] 100 % (06/03 0748) FiO2 (%):  [2 %] 2 % (06/02 2017) Weight:  [69.5 kg] 69.5 kg (06/03 0649) Last BM Date: 07/27/18(per patient RN did not witness)  Weight change: Filed Weights   07/24/18 2110 07/27/18 0500 07/28/18 0649  Weight: 77 kg 73.3 kg 69.5 kg    Intake/Output:   Intake/Output Summary (Last 24 hours) at 07/28/2018 1402 Last data filed at 07/28/2018 1108 Gross per 24 hour  Intake 483 ml  Output 4220 ml  Net -3737 ml     Physical Exam: General:  Well appearing. No resp difficulty HEENT: normal Neck: supple. no JVD. Carotids 2+ bilat; no bruits. No lymphadenopathy or thryomegaly appreciated. Cor: PMI nondisplaced. IRR. No rubs, gallops or murmurs. Lungs: clear Abdomen: soft, nontender, nondistended. No hepatosplenomegaly. No bruits or masses. Good bowel sounds. Extremities: no cyanosis, clubbing, rash, edema Neuro: alert & orientedx3, cranial nerves grossly intact. moves all 4 extremities w/o difficulty. Affect pleasant   Telemetry: AF 60-70 Personally reviewed   Labs: Basic Metabolic Panel: Recent Labs  Lab 07/21/18 1754 07/24/18 1508 07/26/18 1008 07/27/18 0021 07/28/18 0333  NA 141 137 136 133* 138  K 3.8 3.9 3.8 3.4* 3.0*  CL 102 100 100 93* 92*  CO2 27 25 21* 25 31  GLUCOSE 177* 68* 153* 213* 167*  BUN 25* 27* 25* 32* 29*  CREATININE 1.27* 1.22 1.23 1.37* 1.16  CALCIUM 9.8 9.0 9.1 8.7* 8.7*  MG  --   --   --   --  2.1    Liver Function Tests: Recent Labs   Lab 07/24/18 1508  AST 30  ALT 20  ALKPHOS 33*  BILITOT 0.8  PROT 6.7  ALBUMIN 3.8   No results for input(s): LIPASE, AMYLASE in the last 168 hours. No results for input(s): AMMONIA in the last 168 hours.  CBC: Recent Labs  Lab 07/21/18 1754 07/24/18 1508 07/26/18 1008 07/27/18 0021 07/28/18 0333  WBC 7.2 8.5 11.0* 10.9* 10.9*  NEUTROABS 5.2 6.3  --   --   --   HGB 12.8* 12.3* 13.3 13.7 14.1  HCT 41.8 39.4 41.7 41.9 43.9  MCV 87.8 86.6 83.1 82.5 82.8  PLT 374 386 452* 475* 443*    Cardiac Enzymes: Recent Labs  Lab 07/26/18 1008 07/26/18 1229 07/26/18 1706 07/27/18 0021  TROPONINI 0.08* 0.23* 0.36* 0.35*    BNP: BNP (last 3 results) Recent Labs    07/21/18 1754  BNP 2,433.0*    ProBNP (last 3 results) No results for input(s): PROBNP in the last 8760 hours.    Other results:  Imaging: Dg Chest Port 1 View  Result Date: 07/27/2018 CLINICAL DATA:  Status post PICC line placement. EXAM: PORTABLE CHEST 1 VIEW COMPARISON:  PA and lateral chest 07/26/2018. FINDINGS: Tip of new PICC projects in the lower superior vena cava. Pulmonary edema seen on the comparison examination has resolved. There small bilateral pleural effusions and basilar atelectasis. Atherosclerosis and cardiomegaly noted. IMPRESSION: Tip  of right PICC projects in the lower superior vena cava. Resolved pulmonary edema. Small bilateral pleural effusions basilar atelectasis. Electronically Signed   By: Inge Rise M.D.   On: 07/27/2018 13:19   [place] Ekg Site Rite  Result Date: 07/27/2018 If Site Rite image not attached, placement could not be confirmed due to current cardiac rhythm.  [place] Ekg Site Rite  Result Date: 07/27/2018 If Site Rite image not attached, placement could not be confirmed due to current cardiac rhythm.    Medications:     Scheduled Medications: . aspirin EC  81 mg Oral Daily  . digoxin  0.125 mg Oral Daily  . ezetimibe  10 mg Oral Daily  . fenofibrate  160 mg Oral  Daily  . furosemide  80 mg Intravenous BID  . insulin aspart  0-15 Units Subcutaneous TID WC  . insulin aspart  0-5 Units Subcutaneous QHS  . multivitamin with minerals  1 tablet Oral Daily  . omega-3 acid ethyl esters  2 g Oral BID  . potassium chloride  40 mEq Oral Q4H  . pravastatin  80 mg Oral Daily  . sacubitril-valsartan  1 tablet Oral BID  . sodium chloride flush  10-40 mL Intracatheter Q12H  . sodium chloride flush  3 mL Intravenous Q12H  . spironolactone  12.5 mg Oral Daily    Infusions: . sodium chloride    . sodium chloride 10 mL/hr at 07/27/18 0550    PRN Medications: sodium chloride, acetaminophen **OR** acetaminophen (TYLENOL) oral liquid 160 mg/5 mL **OR** acetaminophen, albuterol, calcium carbonate, hydrALAZINE, ipratropium-albuterol, senna-docusate, sodium chloride flush, sodium chloride flush   Assessment/Plan:   1. Acute systolic HF -> cardiogenic shock  - Echo this admit EF 20-25% with RWMA - Echo 7/19 EF 65% - Previous cath with non-obstructive CAD - Much improved. Weight down 15 pounds. CVP 5 - Co-ox improved 67% - Continue digoxin, spiro, Entresto 24/26.  - Plan R/L cath when INR down. Either tomorrow or Friday - Stop lasix  2. Acute hypoxic respiratory failure - Improving with diuresis  3. Chronic AF - off warfarin for cath - start heparin when INR < 2.0 (no bolus) Discussed dosing with PharmD personally.  4. TIA with left carotid stenosis - VVS following for CEA when optimized  5. DM2 - per primary team   6. Hypokalemia - supp.   Length of Stay: 3   Glori Bickers MD 07/28/2018, 2:02 PM  Advanced Heart Failure Team Pager [phone] (M-F; 7a - 4p)  Please contact [facility] for night-coverage after hours (4p -7a ) and weekends on [email or web address]

## 2018-07-28 NOTE — Progress Notes (Signed)
[place] for warfarin/heparin Indication: atrial fibrillation  Allergies  Allergen Reactions  . Penicillins Hives    Blisters on hands  . Statins     Joint and muscle pain and low energy.    Patient Measurements: Height: 5\' 6"  (167.6 cm) Weight: 153 lb 3.2 oz (69.5 kg) IBW/kg (Calculated) : 63.8  Vital Signs: Temp: 98 F (36.7 C) (06/03 0748) Temp Source: Oral (06/03 0748) BP: 122/60 (06/03 0748) Pulse Rate: 87 (06/03 0748)  Labs: Recent Labs    07/26/18 0800  07/26/18 1008 07/26/18 1229 07/26/18 1706 07/27/18 0021 07/28/18 0333  HGB  --    < > 13.3  --   --  13.7 14.1  HCT  --   --  41.7  --   --  41.9 43.9  PLT  --   --  452*  --   --  475* 443*  LABPROT 27.6*  --   --   --   --  28.4* 24.2*  INR 2.6*  --   --   --   --  2.7* 2.2*  CREATININE  --   --  1.23  --   --  1.37* 1.16  TROPONINI  --    < > 0.08* 0.23* 0.36* 0.35*  --    < > = values in this interval not displayed.    Estimated Creatinine Clearance: 46.6 mL/min (by C-G formula based on SCr of 1.16 mg/dL).   Assessment: 78 yo male her with AMS and TIA. He is on warfarin PTA for afib and pharmacy was consulted to dose.   INR today remains therapeutic at 2.2, warfarin has been held the last two evenings  Home warfarin dose: 7.5mg  MWThSa (last clinic visit was 07/22/18; INR was 2.6)   Goal of Therapy:  INR 2-3 Monitor platelets by anticoagulation protocol: Yes   Plan:  - Hold warfarin in anticipation for cardiac cath - Start heparin once INR < 2 - Daily PT/INR  Claiborne Billings, PharmD PGY2 Cardiology Pharmacy Resident Please check AMION for all Pharmacist numbers by unit 07/28/2018 9:07 AM

## 2018-07-28 NOTE — Care Management Important Message (Signed)
Important Message  Patient Details  Name: Oscar [name] MRN: [id number] Date of Birth: [birth year]   Medicare Important Message Given:  Yes    Orbie Pyo 07/28/2018, 2:15 PM

## 2018-07-29 ENCOUNTER — Encounter (HOSPITAL_COMMUNITY)
Admission: EM | Disposition: A | Discharge status: Home / Self Care | Payer: Self-pay | Source: Home / Self Care | Attending: Internal Medicine

## 2018-07-29 DIAGNOSIS — I2510 Atherosclerotic heart disease of native coronary artery without angina pectoris: Secondary | ICD-10-CM

## 2018-07-29 HISTORY — PX: RIGHT/LEFT HEART CATH AND CORONARY ANGIOGRAPHY: CATH118266

## 2018-07-29 LAB — BASIC METABOLIC PANEL
Anion gap: 10 (ref 5–15)
BUN: 26 mg/dL — ABNORMAL HIGH (ref 8–23)
CO2: 30 mmol/L (ref 22–32)
Calcium: 8.9 mg/dL (ref 8.9–10.3)
Chloride: 97 mmol/L — ABNORMAL LOW (ref 98–111)
Creatinine, Ser: 1.17 mg/dL (ref 0.61–1.24)
GFR calc Af Amer: >60 mL/min (ref >60)
GFR calc non Af Amer: 59 mL/min — ABNORMAL LOW (ref >60)
Glucose, Bld: 161 mg/dL — ABNORMAL HIGH (ref 70–99)
Potassium: 3.9 mmol/L (ref 3.5–5.1)
Sodium: 137 mmol/L (ref 135–145)

## 2018-07-29 LAB — POCT I-STAT EG7
Acid-Base Excess: 5 mmol/L — ABNORMAL HIGH (ref 0.0–2.0)
Acid-Base Excess: 5 mmol/L — ABNORMAL HIGH (ref 0.0–2.0)
Bicarbonate: 29.3 mmol/L — ABNORMAL HIGH (ref 20.0–28.0)
Bicarbonate: 29.3 mmol/L — ABNORMAL HIGH (ref 20.0–28.0)
Calcium, Ion: 1.19 mmol/L (ref 1.15–1.40)
Calcium, Ion: 1.22 mmol/L (ref 1.15–1.40)
HCT: 47 % (ref 39.0–52.0)
HCT: 47 % (ref 39.0–52.0)
Hemoglobin: 16 g/dL (ref 13.0–17.0)
Hemoglobin: 16 g/dL (ref 13.0–17.0)
O2 Saturation: 62 %
O2 Saturation: 64 %
Potassium: 3.6 mmol/L (ref 3.5–5.1)
Potassium: 3.7 mmol/L (ref 3.5–5.1)
Sodium: 139 mmol/L (ref 135–145)
Sodium: 139 mmol/L (ref 135–145)
TCO2: 31 mmol/L (ref 22–32)
TCO2: 31 mmol/L (ref 22–32)
pCO2, Ven: 42 mmHg — ABNORMAL LOW (ref 44.0–60.0)
pCO2, Ven: 42.5 mmHg — ABNORMAL LOW (ref 44.0–60.0)
pH, Ven: 7.447 — ABNORMAL HIGH (ref 7.250–7.430)
pH, Ven: 7.451 — ABNORMAL HIGH (ref 7.250–7.430)
pO2, Ven: 31 mmHg — CL (ref 32.0–45.0)
pO2, Ven: 32 mmHg (ref 32.0–45.0)

## 2018-07-29 LAB — COOXEMETRY PANEL
Carboxyhemoglobin: 1.6 % — ABNORMAL HIGH (ref 0.5–1.5)
Carboxyhemoglobin: 1.7 % — ABNORMAL HIGH (ref 0.5–1.5)
Methemoglobin: 1.4 % (ref 0.0–1.5)
Methemoglobin: 0.8 % (ref 0.0–1.5)
O2 Saturation: 60.3 %
O2 Saturation: 49.7 %
Total hemoglobin: 14.7 g/dL (ref 12.0–16.0)
Total hemoglobin: 15.8 g/dL (ref 12.0–16.0)

## 2018-07-29 LAB — GLUCOSE, CAPILLARY
Glucose-Capillary: 183 mg/dL — ABNORMAL HIGH (ref 70–99)
Glucose-Capillary: 228 mg/dL — ABNORMAL HIGH (ref 70–99)
Glucose-Capillary: 178 mg/dL — ABNORMAL HIGH (ref 70–99)
Glucose-Capillary: 365 mg/dL — ABNORMAL HIGH (ref 70–99)

## 2018-07-29 LAB — POCT I-STAT 7, (LYTES, BLD GAS, ICA,H+H)
Acid-Base Excess: 3 mmol/L — ABNORMAL HIGH (ref 0.0–2.0)
Bicarbonate: 26.1 mmol/L (ref 20.0–28.0)
Calcium, Ion: 1.1 mmol/L — ABNORMAL LOW (ref 1.15–1.40)
HCT: 45 % (ref 39.0–52.0)
Hemoglobin: 15.3 g/dL (ref 13.0–17.0)
O2 Saturation: 93 %
Potassium: 3.5 mmol/L (ref 3.5–5.1)
Sodium: 140 mmol/L (ref 135–145)
TCO2: 27 mmol/L (ref 22–32)
pCO2 arterial: 36.2 mmHg (ref 32.0–48.0)
pH, Arterial: 7.465 — ABNORMAL HIGH (ref 7.350–7.450)
pO2, Arterial: 63 mmHg — ABNORMAL LOW (ref 83.0–108.0)

## 2018-07-29 LAB — PROTIME-INR
INR: 1.8 — ABNORMAL HIGH (ref 0.8–1.2)
Prothrombin Time: 20.5 seconds — ABNORMAL HIGH (ref 11.4–15.2)

## 2018-07-29 SURGERY — RIGHT/LEFT HEART CATH AND CORONARY ANGIOGRAPHY
Anesthesia: LOCAL

## 2018-07-29 MED ORDER — IOHEXOL 350 MG/ML SOLN
INTRAVENOUS | Status: DC | PRN
  Administered 2018-07-29: 17:00:00 90 mL via INTRA_ARTERIAL

## 2018-07-29 MED ORDER — VERAPAMIL HCL 2.5 MG/ML IV SOLN
INTRAVENOUS | Status: AC
  Filled 2018-07-29: qty 2

## 2018-07-29 MED ORDER — SODIUM CHLORIDE 0.9 % IV SOLN: INTRAVENOUS | Status: DC

## 2018-07-29 MED ORDER — HEPARIN SODIUM (PORCINE) 1000 UNIT/ML IJ SOLN
INTRAMUSCULAR | Status: DC | PRN
  Administered 2018-07-29: 3500 [IU] via INTRAVENOUS

## 2018-07-29 MED ORDER — LIDOCAINE HCL (PF) 1 % IJ SOLN
INTRAMUSCULAR | Status: AC
  Filled 2018-07-29: qty 30

## 2018-07-29 MED ORDER — HYDRALAZINE HCL 20 MG/ML IJ SOLN: 10.0000 mg | INTRAMUSCULAR | Status: AC | PRN

## 2018-07-29 MED ORDER — SODIUM CHLORIDE 0.9% FLUSH
3.0000 mL | Freq: Two times a day (BID) | INTRAVENOUS | Status: DC
  Administered 2018-07-29 – 2018-07-30 (×2): 3 mL via INTRAVENOUS

## 2018-07-29 MED ORDER — LIDOCAINE HCL (PF) 1 % IJ SOLN
INTRAMUSCULAR | Status: DC | PRN
  Administered 2018-07-29: 5 mL

## 2018-07-29 MED ORDER — LABETALOL HCL 5 MG/ML IV SOLN: 10.0000 mg | INTRAVENOUS | Status: AC | PRN

## 2018-07-29 MED ORDER — HEPARIN (PORCINE) 25000 UT/250ML-% IV SOLN
1300.0000 [IU]/h | INTRAVENOUS | Status: DC
  Administered 2018-07-29: 1050 [IU]/h via INTRAVENOUS
  Administered 2018-07-30 – 2018-08-02 (×4): 1300 [IU]/h via INTRAVENOUS
  Filled 2018-07-29 (×4): qty 250

## 2018-07-29 MED ORDER — ASPIRIN 81 MG PO CHEW: 81.0000 mg | CHEWABLE_TABLET | ORAL | Status: AC

## 2018-07-29 MED ORDER — SODIUM CHLORIDE 0.9% FLUSH: 3.0000 mL | Freq: Two times a day (BID) | INTRAVENOUS | Status: DC

## 2018-07-29 MED ORDER — SODIUM CHLORIDE 0.9 % IV SOLN: 250.0000 mL | INTRAVENOUS | Status: DC | PRN

## 2018-07-29 MED ORDER — HEPARIN (PORCINE) IN NACL 1000-0.9 UT/500ML-% IV SOLN
INTRAVENOUS | Status: AC
  Filled 2018-07-29: qty 1000

## 2018-07-29 MED ORDER — HEPARIN (PORCINE) IN NACL 1000-0.9 UT/500ML-% IV SOLN
INTRAVENOUS | Status: DC | PRN
  Administered 2018-07-29 (×2): 500 mL

## 2018-07-29 MED ORDER — ONDANSETRON HCL 4 MG/2ML IJ SOLN: 4.0000 mg | Freq: Four times a day (QID) | INTRAMUSCULAR | Status: DC | PRN

## 2018-07-29 MED ORDER — ACETAMINOPHEN 325 MG PO TABS: 650.0000 mg | ORAL_TABLET | ORAL | Status: DC | PRN

## 2018-07-29 MED ORDER — SODIUM CHLORIDE 0.9% FLUSH: 3.0000 mL | INTRAVENOUS | Status: DC | PRN

## 2018-07-29 MED ORDER — VERAPAMIL HCL 2.5 MG/ML IV SOLN
INTRAVENOUS | Status: DC | PRN
  Administered 2018-07-29: 10 mL via INTRA_ARTERIAL

## 2018-07-29 MED ORDER — HEPARIN (PORCINE) 25000 UT/250ML-% IV SOLN
1050.0000 [IU]/h | INTRAVENOUS | Status: DC
  Administered 2018-07-29: 1050 [IU]/h via INTRAVENOUS
  Filled 2018-07-29: qty 250

## 2018-07-29 MED ORDER — SODIUM CHLORIDE 0.9 % IV SOLN: INTRAVENOUS | Status: AC

## 2018-07-29 SURGICAL SUPPLY — 15 items
CATH 5FR JL3.5 JR4 ANG PIG MP (CATHETERS) ×1 IMPLANT
CATH BALLN WEDGE 5F 110CM (CATHETERS) ×1 IMPLANT
CATH LAUNCHER 5F JL3.5 (CATHETERS) IMPLANT
CATHETER LAUNCHER 5F JL3.5 (CATHETERS) ×2
DEVICE RAD COMP TR BAND LRG (VASCULAR PRODUCTS) ×1 IMPLANT
GLIDESHEATH SLEND SS 6F .021 (SHEATH) ×1 IMPLANT
GUIDEWIRE INQWIRE 1.5J.035X260 (WIRE) IMPLANT
INQWIRE 1.5J .035X260CM (WIRE) ×2
KIT HEART LEFT (KITS) ×2 IMPLANT
KIT MICROPUNCTURE NIT STIFF (SHEATH) ×1 IMPLANT
PACK CARDIAC CATHETERIZATION (CUSTOM PROCEDURE TRAY) ×2 IMPLANT
SHEATH GLIDE SLENDER 4/5FR (SHEATH) ×1 IMPLANT
SHEATH PROBE COVER 6X72 (BAG) ×1 IMPLANT
TRANSDUCER W/STOPCOCK (MISCELLANEOUS) ×2 IMPLANT
TUBING CIL FLEX 10 FLL-RA (TUBING) ×2 IMPLANT

## 2018-07-29 NOTE — Progress Notes (Signed)
PT Cancellation Note  Patient Details Name: Oscar [name] MRN: [id number] DOB: 1940-10-31   Cancelled Treatment:    Reason Eval/Treat Not Completed: Patient at procedure or test/unavailable.  Pt heading to [facility] lab imminently.  Will see tomorrow as able. 07/29/2018  Donnella Sham, [facility] [phone]  (pager) [phone]  (office)   Oscar [name] 07/29/2018, 3:25 PM

## 2018-07-29 NOTE — Progress Notes (Signed)
Advanced Heart Failure Rounding Note   Subjective:    IV lasix stopped yesterday. Feels much better. No orthopnea or PND. CVP 2 (checked personally with RN)  INR 1.8. Heparin started overnight. No bleeding. Remains in AF. Co-ox 60%  Objective:   Weight Range:  Vital Signs:   Temp:  [97.3 F (36.3 C)-98.5 F (36.9 C)] 97.7 F (36.5 C) (06/04 0745) Pulse Rate:  [76-110] 95 (06/04 0401) Resp:  [17-22] 22 (06/04 0401) BP: (107-124)/(55-75) 107/63 (06/04 0745) SpO2:  [94 %-98 %] 95 % (06/04 0401) Last BM Date: 07/28/18  Weight change: Filed Weights   07/24/18 2110 07/27/18 0500 07/28/18 0649  Weight: 77 kg 73.3 kg 69.5 kg    Intake/Output:   Intake/Output Summary (Last 24 hours) at 07/29/2018 0900 Last data filed at 07/29/2018 6644 Gross per 24 hour  Intake 373.01 ml  Output 2750 ml  Net -2376.99 ml     Physical Exam: General:  Well appearing. No resp difficulty HEENT: normal Neck: supple. no JVD. Carotids 2+ bilat; no bruits. No lymphadenopathy or thryomegaly appreciated. Cor: PMI nondisplaced. Irregular rate & rhythm. No rubs, gallops or murmurs. Lungs: clear Abdomen: soft, nontender, nondistended. No hepatosplenomegaly. No bruits or masses. Good bowel sounds. Extremities: no cyanosis, clubbing, rash, edema Neuro: alert & orientedx3, cranial nerves grossly intact. moves all 4 extremities w/o difficulty. Affect pleasant  Telemetry: AF 80-90 Personally reviewed   Labs: Basic Metabolic Panel: Recent Labs  Lab 07/24/18 1508 07/26/18 1008 07/27/18 0021 07/28/18 0333 07/29/18 0451  NA 137 136 133* 138 137  K 3.9 3.8 3.4* 3.0* 3.9  CL 100 100 93* 92* 97*  CO2 25 21* 25 31 30   GLUCOSE 68* 153* 213* 167* 161*  BUN 27* 25* 32* 29* 26*  CREATININE 1.22 1.23 1.37* 1.16 1.17  CALCIUM 9.0 9.1 8.7* 8.7* 8.9  MG  --   --   --  2.1  --     Liver Function Tests: Recent Labs  Lab 07/24/18 1508  AST 30  ALT 20  ALKPHOS 33*  BILITOT 0.8  PROT 6.7  ALBUMIN  3.8   No results for input(s): LIPASE, AMYLASE in the last 168 hours. No results for input(s): AMMONIA in the last 168 hours.  CBC: Recent Labs  Lab 07/24/18 1508 07/26/18 1008 07/27/18 0021 07/28/18 0333  WBC 8.5 11.0* 10.9* 10.9*  NEUTROABS 6.3  --   --   --   HGB 12.3* 13.3 13.7 14.1  HCT 39.4 41.7 41.9 43.9  MCV 86.6 83.1 82.5 82.8  PLT 386 452* 475* 443*    Cardiac Enzymes: Recent Labs  Lab 07/26/18 1008 07/26/18 1229 07/26/18 1706 07/27/18 0021  TROPONINI 0.08* 0.23* 0.36* 0.35*    BNP: BNP (last 3 results) Recent Labs    07/21/18 1754  BNP 2,433.0*    ProBNP (last 3 results) No results for input(s): PROBNP in the last 8760 hours.    Other results:  Imaging: Dg Chest Port 1 View  Result Date: 07/27/2018 CLINICAL DATA:  Status post PICC line placement. EXAM: PORTABLE CHEST 1 VIEW COMPARISON:  PA and lateral chest 07/26/2018. FINDINGS: Tip of new PICC projects in the lower superior vena cava. Pulmonary edema seen on the comparison examination has resolved. There small bilateral pleural effusions and basilar atelectasis. Atherosclerosis and cardiomegaly noted. IMPRESSION: Tip of right PICC projects in the lower superior vena cava. Resolved pulmonary edema. Small bilateral pleural effusions basilar atelectasis. Electronically Signed   By: Inge Rise M.D.  On: 07/27/2018 13:19   [place] Ekg Site Rite  Result Date: 07/27/2018 If Site Rite image not attached, placement could not be confirmed due to current cardiac rhythm.    Medications:     Scheduled Medications: . aspirin  81 mg Oral Pre-Cath  . aspirin EC  81 mg Oral Daily  . digoxin  0.125 mg Oral Daily  . ezetimibe  10 mg Oral Daily  . fenofibrate  160 mg Oral Daily  . insulin aspart  0-15 Units Subcutaneous TID WC  . insulin aspart  0-5 Units Subcutaneous QHS  . multivitamin with minerals  1 tablet Oral Daily  . omega-3 acid ethyl esters  2 g Oral BID  . pravastatin  80 mg Oral Daily  .  sacubitril-valsartan  1 tablet Oral BID  . sodium chloride flush  10-40 mL Intracatheter Q12H  . sodium chloride flush  3 mL Intravenous Q12H  . spironolactone  12.5 mg Oral Daily    Infusions: . sodium chloride    . sodium chloride    . heparin 1,050 Units/hr (07/29/18 0811)    PRN Medications: sodium chloride, acetaminophen **OR** acetaminophen (TYLENOL) oral liquid 160 mg/5 mL **OR** acetaminophen, albuterol, calcium carbonate, hydrALAZINE, ipratropium-albuterol, senna-docusate, sodium chloride flush, sodium chloride flush   Assessment/Plan:   1. Acute systolic HF -> cardiogenic shock  - Echo this admit EF 20-25% with RWMA - Echo 7/19 EF 65% - Previous cath with non-obstructive CAD - Much improved. Weight down 15 pounds. CVP 2. Hold lasix - Co-ox improved 60% - Continue digoxin, spiro, Entresto 24/26.  - Plan R/L cath when INR down -> will do it this afternoon. Keep NPO after breakfast. Case discussed with him, Orders written - No b-blocker yet wiuth low output  2. Acute hypoxic respiratory failure - Improving with diuresis  3. Chronic AF - off warfarin for cath - on heparin with INR < 2.0 (no bolus) no bleeding Discussed dosing with PharmD personally.  4. TIA with left carotid stenosis - VVS following for CEA when optimized  5. DM2 - per primary team   6. Hypokalemia - supp.   Length of Stay: 4   Glori Bickers MD 07/29/2018, 9:00 AM  Advanced Heart Failure Team Pager [phone] (M-F; [place])  Please contact [facility] for night-coverage after hours (4p -7a ) and weekends on [email or web address]

## 2018-07-29 NOTE — Progress Notes (Signed)
[place] for heparin Indication: atrial fibrillation, MV CAD  Allergies  Allergen Reactions  . Penicillins Hives    Blisters on hands  . Statins     Joint and muscle pain and low energy.    Patient Measurements: Height: 5\' 6"  (167.6 cm) Weight: 153 lb 3.2 oz (69.5 kg) IBW/kg (Calculated) : 63.8  HEPARIN DW (KG): 77   Vital Signs: Temp: 98.3 F (36.8 C) (06/04 1721) Temp Source: Oral (06/04 1721) BP: 160/91 (06/04 1721) Pulse Rate: 96 (06/04 1721)  Labs: Recent Labs    07/27/18 0021 07/28/18 0333 07/29/18 0451  HGB 13.7 14.1  --   HCT 41.9 43.9  --   PLT 475* 443*  --   LABPROT 28.4* 24.2* 20.5*  INR 2.7* 2.2* 1.8*  CREATININE 1.37* 1.16 1.17  TROPONINI 0.35*  --   --     Estimated Creatinine Clearance: 46.2 mL/min (by C-G formula based on SCr of 1.17 mg/dL).   Assessment: 78 yo male her with AMS and TIA. He is on warfarin PTA for afib and pharmacy was consulted to dose.   INR today subtherapeutic at 1.8, warfarin has been held for cath.  Home warfarin dose: 7.5mg  MWThSa (last clinic visit was 07/22/18; INR was 2.6)  Now s/p cath, found to have multivessel CAD. CVTS consulted. Will restart heparin tonight.    Goal of Therapy:  INR 2-3  HL 0.3-0.7 Monitor platelets by anticoagulation protocol: Yes   Plan:  - Start heparin infusion at 1000 units/hr >>midnight tonight - Check anti-Xa level in 8 hours and daily while on heparin - Continue to monitor H&H and platelets  Erin Hearing PharmD., BCPS Clinical Pharmacist 07/29/2018 6:21 PM

## 2018-07-29 NOTE — Interval H&P Note (Signed)
History and Physical Interval Note:  07/29/2018 3:43 PM  Oscar [name]  has presented today for surgery, with the diagnosis of heart failure.  The various methods of treatment have been discussed with the patient and family. After consideration of risks, benefits and other options for treatment, the patient has consented to  Procedure(s): RIGHT/LEFT HEART CATH AND CORONARY ANGIOGRAPHY (N/A) and possible coronary angioplasty as a surgical intervention.  The patient's history has been reviewed, patient examined, no change in status, stable for surgery.  I have reviewed the patient's chart and labs.  Questions were answered to the patient's satisfaction.     Hasaan Radde

## 2018-07-29 NOTE — H&P (View-Only) (Signed)
Advanced Heart Failure Rounding Note   Subjective:    IV lasix stopped yesterday. Feels much better. No orthopnea or PND. CVP 2 (checked personally with RN)  INR 1.8. Heparin started overnight. No bleeding. Remains in AF. Co-ox 60%  Objective:   Weight Range:  Vital Signs:   Temp:  [97.3 F (36.3 C)-98.5 F (36.9 C)] 97.7 F (36.5 C) (06/04 0745) Pulse Rate:  [76-110] 95 (06/04 0401) Resp:  [17-22] 22 (06/04 0401) BP: (107-124)/(55-75) 107/63 (06/04 0745) SpO2:  [94 %-98 %] 95 % (06/04 0401) Last BM Date: 07/28/18  Weight change: Filed Weights   07/24/18 2110 07/27/18 0500 07/28/18 0649  Weight: 77 kg 73.3 kg 69.5 kg    Intake/Output:   Intake/Output Summary (Last 24 hours) at 07/29/2018 0900 Last data filed at 07/29/2018 4128 Gross per 24 hour  Intake 373.01 ml  Output 2750 ml  Net -2376.99 ml     Physical Exam: General:  Well appearing. No resp difficulty HEENT: normal Neck: supple. no JVD. Carotids 2+ bilat; no bruits. No lymphadenopathy or thryomegaly appreciated. Cor: PMI nondisplaced. Irregular rate & rhythm. No rubs, gallops or murmurs. Lungs: clear Abdomen: soft, nontender, nondistended. No hepatosplenomegaly. No bruits or masses. Good bowel sounds. Extremities: no cyanosis, clubbing, rash, edema Neuro: alert & orientedx3, cranial nerves grossly intact. moves all 4 extremities w/o difficulty. Affect pleasant  Telemetry: AF 80-90 Personally reviewed   Labs: Basic Metabolic Panel: Recent Labs  Lab 07/24/18 1508 07/26/18 1008 07/27/18 0021 07/28/18 0333 07/29/18 0451  NA 137 136 133* 138 137  K 3.9 3.8 3.4* 3.0* 3.9  CL 100 100 93* 92* 97*  CO2 25 21* 25 31 30   GLUCOSE 68* 153* 213* 167* 161*  BUN 27* 25* 32* 29* 26*  CREATININE 1.22 1.23 1.37* 1.16 1.17  CALCIUM 9.0 9.1 8.7* 8.7* 8.9  MG  --   --   --  2.1  --     Liver Function Tests: Recent Labs  Lab 07/24/18 1508  AST 30  ALT 20  ALKPHOS 33*  BILITOT 0.8  PROT 6.7  ALBUMIN  3.8   No results for input(s): LIPASE, AMYLASE in the last 168 hours. No results for input(s): AMMONIA in the last 168 hours.  CBC: Recent Labs  Lab 07/24/18 1508 07/26/18 1008 07/27/18 0021 07/28/18 0333  WBC 8.5 11.0* 10.9* 10.9*  NEUTROABS 6.3  --   --   --   HGB 12.3* 13.3 13.7 14.1  HCT 39.4 41.7 41.9 43.9  MCV 86.6 83.1 82.5 82.8  PLT 386 452* 475* 443*    Cardiac Enzymes: Recent Labs  Lab 07/26/18 1008 07/26/18 1229 07/26/18 1706 07/27/18 0021  TROPONINI 0.08* 0.23* 0.36* 0.35*    BNP: BNP (last 3 results) Recent Labs    07/21/18 1754  BNP 2,433.0*    ProBNP (last 3 results) No results for input(s): PROBNP in the last 8760 hours.    Other results:  Imaging: Dg Chest Port 1 View  Result Date: 07/27/2018 CLINICAL DATA:  Status post PICC line placement. EXAM: PORTABLE CHEST 1 VIEW COMPARISON:  PA and lateral chest 07/26/2018. FINDINGS: Tip of new PICC projects in the lower superior vena cava. Pulmonary edema seen on the comparison examination has resolved. There small bilateral pleural effusions and basilar atelectasis. Atherosclerosis and cardiomegaly noted. IMPRESSION: Tip of right PICC projects in the lower superior vena cava. Resolved pulmonary edema. Small bilateral pleural effusions basilar atelectasis. Electronically Signed   By: Inge Rise M.D.  On: 07/27/2018 13:19   [place] Ekg Site Rite  Result Date: 07/27/2018 If Site Rite image not attached, placement could not be confirmed due to current cardiac rhythm.    Medications:     Scheduled Medications: . aspirin  81 mg Oral Pre-Cath  . aspirin EC  81 mg Oral Daily  . digoxin  0.125 mg Oral Daily  . ezetimibe  10 mg Oral Daily  . fenofibrate  160 mg Oral Daily  . insulin aspart  0-15 Units Subcutaneous TID WC  . insulin aspart  0-5 Units Subcutaneous QHS  . multivitamin with minerals  1 tablet Oral Daily  . omega-3 acid ethyl esters  2 g Oral BID  . pravastatin  80 mg Oral Daily  .  sacubitril-valsartan  1 tablet Oral BID  . sodium chloride flush  10-40 mL Intracatheter Q12H  . sodium chloride flush  3 mL Intravenous Q12H  . spironolactone  12.5 mg Oral Daily    Infusions: . sodium chloride    . sodium chloride    . heparin 1,050 Units/hr (07/29/18 0811)    PRN Medications: sodium chloride, acetaminophen **OR** acetaminophen (TYLENOL) oral liquid 160 mg/5 mL **OR** acetaminophen, albuterol, calcium carbonate, hydrALAZINE, ipratropium-albuterol, senna-docusate, sodium chloride flush, sodium chloride flush   Assessment/Plan:   1. Acute systolic HF -> cardiogenic shock  - Echo this admit EF 20-25% with RWMA - Echo 7/19 EF 65% - Previous cath with non-obstructive CAD - Much improved. Weight down 15 pounds. CVP 2. Hold lasix - Co-ox improved 60% - Continue digoxin, spiro, Entresto 24/26.  - Plan R/L cath when INR down -> will do it this afternoon. Keep NPO after breakfast. Case discussed with him, Orders written - No b-blocker yet wiuth low output  2. Acute hypoxic respiratory failure - Improving with diuresis  3. Chronic AF - off warfarin for cath - on heparin with INR < 2.0 (no bolus) no bleeding Discussed dosing with PharmD personally.  4. TIA with left carotid stenosis - VVS following for CEA when optimized  5. DM2 - per primary team   6. Hypokalemia - supp.   Length of Stay: 4   Glori Bickers MD 07/29/2018, 9:00 AM  Advanced Heart Failure Team Pager [phone] (M-F; [place])  Please contact [facility] for night-coverage after hours (4p -7a ) and weekends on [email or web address]

## 2018-07-29 NOTE — Progress Notes (Signed)
Inpatient Diabetes Program Recommendations  AACE/ADA: New Consensus Statement on Inpatient Glycemic Control (2015)  Target Ranges:  Prepandial:   less than 140 mg/dL      Peak postprandial:   less than 180 mg/dL (1-2 hours)      Critically ill patients:  140 - 180 mg/dL   Results for JAMIERE, GULAS (MRN [id number]) as of 07/28/2018 08:13  Ref. Range 07/27/2018 07:52 07/27/2018 11:43 07/27/2018 16:34 07/27/2018 21:16 07/28/2018 06:23  Glucose-Capillary Latest Ref Range: 70 - 99 mg/dL 136 (H) 219 (H) 255 (H) 180 (H) 157 (H)    Review of Glycemic Control  Diabetes history: DM 2 Outpatient Diabetes medications: Glipizide 10 mg bid, metformin 500 mg bid, Tradjenta 5 mg daily Current orders for Inpatient glycemic control: Novolog 0-15 units tid, Novolog 0-5 units qhs  A1c 6.9% on 5/31  Inpatient Diabetes Program Recommendations:    Glucose trends increase after meals, Consider Novolog 6 units tid meal coverage if patient consumes at least 50% of meals.  Thanks,  Tama Headings RN, MSN, BC-ADM Inpatient Diabetes Coordinator Team Pager [phone] (8a-5p)

## 2018-07-29 NOTE — Progress Notes (Signed)
[place] for warfarin/heparin Indication: atrial fibrillation  Allergies  Allergen Reactions  . Penicillins Hives    Blisters on hands  . Statins     Joint and muscle pain and low energy.    Patient Measurements: Height: 5\' 6"  (167.6 cm) Weight: 153 lb 3.2 oz (69.5 kg) IBW/kg (Calculated) : 63.8  HEPARIN DW (KG): 77   Vital Signs: Temp: 98.5 F (36.9 C) (06/04 0401) Temp Source: Oral (06/04 0401) BP: 112/67 (06/04 0401) Pulse Rate: 95 (06/04 0401)  Labs: Recent Labs    07/26/18 1008 07/26/18 1229 07/26/18 1706 07/27/18 0021 07/28/18 0333 07/29/18 0451  HGB 13.3  --   --  13.7 14.1  --   HCT 41.7  --   --  41.9 43.9  --   PLT 452*  --   --  475* 443*  --   LABPROT  --   --   --  28.4* 24.2* 20.5*  INR  --   --   --  2.7* 2.2* 1.8*  CREATININE 1.23  --   --  1.37* 1.16 1.17  TROPONINI 0.08* 0.23* 0.36* 0.35*  --   --     Estimated Creatinine Clearance: 46.2 mL/min (by C-G formula based on SCr of 1.17 mg/dL).   Assessment: 78 yo male her with AMS and TIA. He is on warfarin PTA for afib and pharmacy was consulted to dose.   INR today subtherapeutic at 1.8, warfarin has been held for cath.  Home warfarin dose: 7.5mg  MWThSa (last clinic visit was 07/22/18; INR was 2.6)   Goal of Therapy:  INR 2-3  HL 0.3-0.7 Monitor platelets by anticoagulation protocol: Yes   Plan:  - Start heparin infusion at 1050 units/hr - Check anti-Xa level in 8 hours and daily while on heparin - Continue to monitor H&H and platelets - Hold warfarin in anticipation for cardiac cath  Claiborne Billings, PharmD PGY2 Cardiology Pharmacy Resident Please check AMION for all Pharmacist numbers by unit 07/29/2018 7:05 AM

## 2018-07-29 NOTE — Progress Notes (Signed)
PROGRESS NOTE   Oscar [name]  IRJ:[id number]    DOB: 1940/05/30    DOA: 07/24/2018  PCP: No primary care provider on file.   I have briefly reviewed patients previous medical records in [facility].  Brief Narrative:  78 year old married male, PMH of permanent A. fib on warfarin, COPD on home oxygen 4 L/min at bedtime, chronic systolic CHF with subsequent normalization of EF, nonobstructive CAD, DM 2, HTN, HLD, carotid artery disease status post left CEA in 2007, presented to ED for evaluation of transient episode of AMS, slurred speech and balance difficulty.  He was admitted for suspected TIA and found to have occluded right ICA.  Hospital course complicated by acute new systolic CHF with EF 30-16% and acute respiratory failure with hypoxia.  Cardiology consulted.   Assessment & Plan:   Active Problems:   ATRIAL FIBRILLATION   History of left-sided carotid endarterectomy   Carotid artery stenosis   TIA (transient ischemic attack)   Acute on chronic respiratory failure with hypoxia -On home oxygen 4 L/min at bedtime. -Acute respiratory failure likely related to acute CHF -Acute respiratory failure resolved.  New acute sytolic heart failure/cardiomyopathy/cardiogenic shock -Echocardiogram showed EF 01-09%, LV diastolic parameters indeterminate.  Only base of left ventricle moves well.  RV moderately reduced systolic function. -LVEF 65% on 7/19. -Previous cath with nonobstructive CAD. -Cardiology consultation and follow-up appreciated. -Treated with IV furosemide 80 mg twice daily.  IV Lasix stopped 6/3. - - 9 L since admission.  Weight down from 168 pounds to 153 pounds. -Continue digoxin, spironolactone and Entresto.  No beta-blockers due to low output. -Clinically euvolemic. -Discussed with Dr. Haroldine Laws, plan for right and left cath this afternoon.  INR down to 1.8.  Chest tightness/elevated troponin -Suspect due to decompensated CHF. -Troponin peaked to 0.36 but  plateaued.  Elevated troponin likely due to demand ischemia from A. fib with RVR and acute CHF. -Previous cath with nonobstructive CAD and plan for repeat cath this afternoon.  TIA -Patient presented with a brief episode of altered mental status, slurred speech, unsteady gait -CT head with no acute findings -CTA head and neck with significant right carotid artery narrowing -MRI showed no acute or subacute finding -Carotid Doppler:Bilateral ICA 1 to 39% stenosis bilateral vertebral arteriesdemonstrate antegrade flow -History of atrial fibrillation on anticoagulation with Coumadin -Echocardiogram results as above. -LDL55, Hemoglobin A1c6.9 -Neurology consulted and appreciated, recommended vascular consultation  -Continue aspirin, statin.  Coumadin held for potential cardiac cath.  INR 1.8 and IV heparin initiated per pharmacy. -PT assessed patient and recommended no further therapy  Carotid artery stenosis -CTA head and neck showed significant stenosis R ICA -History of left carotid endarterectomy -Vascular surgery follow-up from 6/2 appreciated.  Plans for right carotid endarterectomy when cardiac and pulmonary status optimized. -Continue aspirin and statins.  Essential hypertension -Controlled.  Carvedilol and lisinopril held. -Currently on Entresto, Aldactone.  IV Lasix stopped 6/3.  Hyperlipidemia -Takes Zetia, fenofibrate, pravastatin -lipid panel: total cholesterol 95, HDL 17, LDL 55, TG 113 -patient has not been on welchol in months  Diabetes mellitus, type II -CBGs mildly uncontrolled and fluctuating. -A1c 6.9.   -DM coordinator input appreciated, changed SSI to moderate sensitivity along with bedtime scale.  Consider adding mealtime NovoLog postprocedure when tolerating diet well. -on glipizide, tradjenta, metformin at home, currently on hold.  Chronic atrial fibrillation, now with RVR -Suspect RVR secondary to respiratory status -CHADSVASC 8 -Coreg held to  left permissive hypertension -Continue digoxin -INR 1.8 -Coumadin held, started  IV heparin for INR <2. -Controlled ventricular rate.  Upper thoracic adenopathy -found on CTA head/neck -CT chest: Multiple enlarged medium sized mediastinal lymph nodes.  Differential would include reactive adenopathy versus lymphoproliferative process. - Recommended repeat CT chest, abdomen pelvis with contrast in 1 month.  NSVT Vs A. fib with aberrancy -Noted 28 beat wide complex nonsustained tachycardia on 6/2 at 3:07 AM. -Continue monitoring on telemetry. -Replace potassium to >4.  Magnesium 2.1. -Cardiology to consider starting beta-blockers. -No recurrence noted.  Hypokalemia -Potassium replaced/3.9.  Magnesium 2.1.   DVT prophylaxis: Coumadin held.  Start IV heparin when INR <2. Code Status: Full Family Communication: None at bedside Disposition: To be determined pending further evaluation and management in the hospital by cardiology and vascular surgery as noted above.   Consultants:  Neurology Vascular Surgery Cardiology- CHF team  Procedures:  Carotid doppler Echocardiogram RUE PICC line  Antimicrobials:  None   Subjective: Patient seen this morning prior to procedures.  Reports that his breathing is almost back to baseline.  No other complaints reported.  As per RN, no acute issues noted.  ROS: As above, otherwise negative.  Objective:  Vitals:   07/28/18 2341 07/29/18 0401 07/29/18 0745 07/29/18 1138  BP: 123/75 112/67 107/63   Pulse: 76 95    Resp: (!) 22 (!) 22    Temp: 98 F (36.7 C) 98.5 F (36.9 C) 97.7 F (36.5 C) 97.9 F (36.6 C)  TempSrc: Oral Oral Oral Oral  SpO2: 98% 95%    Weight:      Height:        Examination:  General exam: Pleasant elderly male, moderately built and nourished seen ambulating comfortably in the room. Respiratory system: Clear to auscultation.  No increased work of breathing. Cardiovascular system: S1 & S2 heard, irregularly  irregular. No JVD, murmurs, rubs, gallops or clicks. No pedal edema.  Telemetry personally reviewed: A. fib with controlled ventricular rate.  No further episodes of wide-complex tachycardia. Gastrointestinal system: Abdomen is nondistended, soft and nontender. No organomegaly or masses felt. Normal bowel sounds heard.  Stable. Central nervous system: Alert and oriented. No focal neurological deficits. Extremities: Symmetric 5 x 5 power. Skin: No rashes, lesions or ulcers Psychiatry: Judgement and insight appear normal. Mood & affect appropriate.     Data Reviewed: I have personally reviewed following labs and imaging studies  CBC: Recent Labs  Lab 07/24/18 1508 07/26/18 1008 07/27/18 0021 07/28/18 0333  WBC 8.5 11.0* 10.9* 10.9*  NEUTROABS 6.3  --   --   --   HGB 12.3* 13.3 13.7 14.1  HCT 39.4 41.7 41.9 43.9  MCV 86.6 83.1 82.5 82.8  PLT 386 452* 475* 009*   Basic Metabolic Panel: Recent Labs  Lab 07/24/18 1508 07/26/18 1008 07/27/18 0021 07/28/18 0333 07/29/18 0451  NA 137 136 133* 138 137  K 3.9 3.8 3.4* 3.0* 3.9  CL 100 100 93* 92* 97*  CO2 25 21* 25 31 30   GLUCOSE 68* 153* 213* 167* 161*  BUN 27* 25* 32* 29* 26*  CREATININE 1.22 1.23 1.37* 1.16 1.17  CALCIUM 9.0 9.1 8.7* 8.7* 8.9  MG  --   --   --  2.1  --    Liver Function Tests: Recent Labs  Lab 07/24/18 1508  AST 30  ALT 20  ALKPHOS 33*  BILITOT 0.8  PROT 6.7  ALBUMIN 3.8   Coagulation Profile: Recent Labs  Lab 07/25/18 0443 07/26/18 0800 07/27/18 0021 07/28/18 0333 07/29/18 0451  INR 2.8* 2.6*  2.7* 2.2* 1.8*   Cardiac Enzymes: Recent Labs  Lab 07/26/18 1008 07/26/18 1229 07/26/18 1706 07/27/18 0021  TROPONINI 0.08* 0.23* 0.36* 0.35*   HbA1C: No results for input(s): HGBA1C in the last 72 hours. CBG: Recent Labs  Lab 07/28/18 1153 07/28/18 1618 07/28/18 2106 07/29/18 0632 07/29/18 1138  GLUCAP 220* 280* 288* 183* 228*    Recent Results (from the past 240 hour(s))  SARS  Coronavirus 2 (CEPHEID - Performed in [place] hospital lab), Hosp Order     Status: None   Collection Time: 07/24/18  4:48 PM  Result Value Ref Range Status   SARS Coronavirus 2 NEGATIVE NEGATIVE Final    Comment: (NOTE) If result is NEGATIVE SARS-CoV-2 target nucleic acids are NOT DETECTED. The SARS-CoV-2 RNA is generally detectable in upper and lower  respiratory specimens during the acute phase of infection. The lowest  concentration of SARS-CoV-2 viral copies this assay can detect is 250  copies / mL. A negative result does not preclude SARS-CoV-2 infection  and should not be used as the sole basis for treatment or other  patient management decisions.  A negative result may occur with  improper specimen collection / handling, submission of specimen other  than nasopharyngeal swab, presence of viral mutation(s) within the  areas targeted by this assay, and inadequate number of viral copies  (<250 copies / mL). A negative result must be combined with clinical  observations, patient history, and epidemiological information. If result is POSITIVE SARS-CoV-2 target nucleic acids are DETECTED. The SARS-CoV-2 RNA is generally detectable in upper and lower  respiratory specimens dur ing the acute phase of infection.  Positive  results are indicative of active infection with SARS-CoV-2.  Clinical  correlation with patient history and other diagnostic information is  necessary to determine patient infection status.  Positive results do  not rule out bacterial infection or co-infection with other viruses. If result is PRESUMPTIVE POSTIVE SARS-CoV-2 nucleic acids MAY BE PRESENT.   A presumptive positive result was obtained on the submitted specimen  and confirmed on repeat testing.  While 2019 novel coronavirus  (SARS-CoV-2) nucleic acids may be present in the submitted sample  additional confirmatory testing may be necessary for epidemiological  and / or clinical management purposes  to  differentiate between  SARS-CoV-2 and other Sarbecovirus currently known to infect humans.  If clinically indicated additional testing with an alternate test  methodology [phone]) is advised. The SARS-CoV-2 RNA is generally  detectable in upper and lower respiratory sp ecimens during the acute  phase of infection. The expected result is Negative. Fact Sheet for Patients:  StrictlyIdeas.no Fact Sheet for Healthcare Providers: BankingDealers.co.za This test is not yet approved or cleared by the [place] FDA and has been authorized for detection and/or diagnosis of SARS-CoV-2 by FDA under an Emergency Use Authorization (EUA).  This EUA will remain in effect (meaning this test can be used) for the duration of the COVID-19 declaration under Section 564(b)(1) of the Act, 21 U.S.C. section 360bbb-3(b)(1), unless the authorization is terminated or revoked sooner. Performed at [facility], [address], [place], [place] [address]          Radiology Studies: No results found.      Scheduled Meds: . [MAR Hold] aspirin EC  81 mg Oral Daily  . [MAR Hold] digoxin  0.125 mg Oral Daily  . [MAR Hold] ezetimibe  10 mg Oral Daily  . [MAR Hold] fenofibrate  160 mg Oral Daily  . [facility] Hold]  insulin aspart  0-15 Units Subcutaneous TID WC  . [MAR Hold] insulin aspart  0-5 Units Subcutaneous QHS  . [MAR Hold] multivitamin with minerals  1 tablet Oral Daily  . [MAR Hold] omega-3 acid ethyl esters  2 g Oral BID  . [MAR Hold] pravastatin  80 mg Oral Daily  . [MAR Hold] sacubitril-valsartan  1 tablet Oral BID  . [MAR Hold] sodium chloride flush  10-40 mL Intracatheter Q12H  . [MAR Hold] sodium chloride flush  3 mL Intravenous Q12H  . sodium chloride flush  3 mL Intravenous Q12H  . [MAR Hold] spironolactone  12.5 mg Oral Daily   Continuous Infusions: . sodium chloride    . sodium chloride    . sodium chloride    . heparin 1,050 Units/hr  (07/29/18 0811)     LOS: 4 days     Vernell Leep, MD, FACP, [facility]. [facility]  To contact the attending provider between 7A-7P or the covering provider during after hours 7P-7A, please log into the web site [email or web address] and access using universal [place] password for that web site. If you do not have the password, please call the hospital operator.  07/29/2018, 5:01 PM

## 2018-07-30 ENCOUNTER — Inpatient Hospital Stay (HOSPITAL_COMMUNITY): Payer: Medicare Other

## 2018-07-30 ENCOUNTER — Encounter (HOSPITAL_COMMUNITY): Payer: TRICARE For Life (TFL)

## 2018-07-30 ENCOUNTER — Other Ambulatory Visit (HOSPITAL_COMMUNITY): Payer: TRICARE For Life (TFL)

## 2018-07-30 ENCOUNTER — Other Ambulatory Visit: Payer: Self-pay | Admitting: Cardiology

## 2018-07-30 ENCOUNTER — Encounter (HOSPITAL_COMMUNITY): Payer: Self-pay | Admitting: Internal Medicine

## 2018-07-30 ENCOUNTER — Other Ambulatory Visit: Payer: Self-pay | Admitting: *Deleted

## 2018-07-30 DIAGNOSIS — J449 Chronic obstructive pulmonary disease, unspecified: Secondary | ICD-10-CM

## 2018-07-30 DIAGNOSIS — I5021 Acute systolic (congestive) heart failure: Secondary | ICD-10-CM

## 2018-07-30 DIAGNOSIS — I651 Occlusion and stenosis of basilar artery: Secondary | ICD-10-CM

## 2018-07-30 DIAGNOSIS — I2510 Atherosclerotic heart disease of native coronary artery without angina pectoris: Secondary | ICD-10-CM

## 2018-07-30 DIAGNOSIS — I482 Chronic atrial fibrillation, unspecified: Secondary | ICD-10-CM

## 2018-07-30 LAB — BASIC METABOLIC PANEL
Anion gap: 11 (ref 5–15)
BUN: 23 mg/dL (ref 8–23)
CO2: 27 mmol/L (ref 22–32)
Calcium: 9.2 mg/dL (ref 8.9–10.3)
Chloride: 99 mmol/L (ref 98–111)
Creatinine, Ser: 1.08 mg/dL (ref 0.61–1.24)
GFR calc Af Amer: >60 mL/min (ref >60)
GFR calc non Af Amer: >60 mL/min (ref >60)
Glucose, Bld: 152 mg/dL — ABNORMAL HIGH (ref 70–99)
Potassium: 3.8 mmol/L (ref 3.5–5.1)
Sodium: 137 mmol/L (ref 135–145)

## 2018-07-30 LAB — CBC
HCT: 43.3 % (ref 39.0–52.0)
Hemoglobin: 13.9 g/dL (ref 13.0–17.0)
MCH: 26.9 pg (ref 26.0–34.0)
MCHC: 32.1 g/dL (ref 30.0–36.0)
MCV: 83.8 fL (ref 80.0–100.0)
Platelets: 454 10*3/uL — ABNORMAL HIGH (ref 150–400)
RBC: 5.17 MIL/uL (ref 4.22–5.81)
RDW: 16.9 % — ABNORMAL HIGH (ref 11.5–15.5)
WBC: 9.7 10*3/uL (ref 4.0–10.5)
nRBC: 0 % (ref 0.0–0.2)

## 2018-07-30 LAB — GLUCOSE, CAPILLARY
Glucose-Capillary: 161 mg/dL — ABNORMAL HIGH (ref 70–99)
Glucose-Capillary: 250 mg/dL — ABNORMAL HIGH (ref 70–99)
Glucose-Capillary: 275 mg/dL — ABNORMAL HIGH (ref 70–99)
Glucose-Capillary: 126 mg/dL — ABNORMAL HIGH (ref 70–99)

## 2018-07-30 LAB — PROTIME-INR
INR: 1.6 — ABNORMAL HIGH (ref 0.8–1.2)
Prothrombin Time: 19 seconds — ABNORMAL HIGH (ref 11.4–15.2)

## 2018-07-30 LAB — HEPARIN LEVEL (UNFRACTIONATED)
Heparin Unfractionated: 0.12 IU/mL — ABNORMAL LOW (ref 0.30–0.70)
Heparin Unfractionated: 0.34 IU/mL (ref 0.30–0.70)

## 2018-07-30 MED ORDER — INSULIN ASPART 100 UNIT/ML ~~LOC~~ SOLN
4.0000 [IU] | Freq: Three times a day (TID) | SUBCUTANEOUS | Status: DC
  Administered 2018-07-30 – 2018-08-02 (×9): 4 [IU] via SUBCUTANEOUS

## 2018-07-30 MED ORDER — GADOBUTROL 1 MMOL/ML IV SOLN
8.0000 mL | Freq: Once | INTRAVENOUS | Status: AC | PRN
  Administered 2018-07-30: 8 mL via INTRAVENOUS

## 2018-07-30 NOTE — Progress Notes (Addendum)
[name] for heparin Indication: atrial fibrillation, MV CAD  Allergies  Allergen Reactions  . Penicillins Hives    Blisters on hands  . Statins     Joint and muscle pain and low energy.    Patient Measurements: Height: 5\' 6"  (167.6 cm) Weight: 153 lb 3.2 oz (69.5 kg) IBW/kg (Calculated) : 63.8  HEPARIN DW (KG): 77   Vital Signs: Temp: 97.3 F (36.3 C) (06/05 0841) Temp Source: Oral (06/05 0841) BP: 122/54 (06/05 0841) Pulse Rate: 90 (06/05 0906)  Labs: Recent Labs    07/28/18 0333 07/29/18 0451  07/29/18 1641 07/29/18 1642 07/30/18 0553 07/30/18 0922  HGB 14.1  --    < > 16.0 16.0 13.9  --   HCT 43.9  --    < > 47.0 47.0 43.3  --   PLT 443*  --   --   --   --  454*  --   LABPROT 24.2* 20.5*  --   --   --  19.0*  --   INR 2.2* 1.8*  --   --   --  1.6*  --   HEPARINUNFRC  --   --   --   --   --   --  0.12*  CREATININE 1.16 1.17  --   --   --  1.08  --    < > = values in this interval not displayed.    Estimated Creatinine Clearance: 50 mL/min (by C-G formula based on SCr of 1.08 mg/dL).   Assessment: Oscar [name] her with AMS and TIA. He is on warfarin PTA for afib and pharmacy was consulted to dose.   Home warfarin dose: 7.5mg  MWThSa (last clinic visit was 07/22/18; INR was 2.6)  INR today subtherapeutic at 1.6, warfarin has been held for cath. Now s/p cath, found to have multivessel CAD. CVTS consulted- pt not interested in surgery. Heparin level this morning came back at 0.12, on 1050 units/hr. Hgb 13.9, plt 454. No s/sx of bleeding.   Goal of Therapy:  INR 2-3  HL 0.3-0.7 Monitor platelets by anticoagulation protocol: Yes   Plan:  -Increase heparin infusion to 1300 units/hr -Check anti-Xa level in 8 hours and daily while on heparin -Continue to monitor H&H and platelets -F/u restart warfarin once cardiac plans determined  Oscar [name], PharmD, [place] Clinical Pharmacist  Pager: [phone] Phone: [phone] 07/30/2018  9:55 AM

## 2018-07-30 NOTE — Progress Notes (Signed)
PROGRESS NOTE   Oscar [name]  TOI:[id number]    DOB: 1941-01-31    DOA: 07/24/2018  PCP: No primary care provider on file.   I have briefly reviewed patients previous medical records in [facility].  Brief Narrative:  78 year old married male, PMH of permanent A. fib on warfarin, COPD on home oxygen 4 L/min at bedtime, chronic systolic CHF with subsequent normalization of EF, nonobstructive CAD, DM 2, HTN, HLD, carotid artery disease status post left CEA in 2007, presented to ED for evaluation of transient episode of AMS, slurred speech and balance difficulty.  He was admitted for suspected TIA and found to have occluded right ICA.  Hospital course complicated by acute new systolic CHF with EF 83-38% and acute respiratory failure with hypoxia.  Cardiology consulted.   Assessment & Plan:   Active Problems:   ATRIAL FIBRILLATION   History of left-sided carotid endarterectomy   Carotid artery stenosis   TIA (transient ischemic attack)   Acute on chronic respiratory failure with hypoxia -On home oxygen 4 L/min at bedtime. -Acute respiratory failure likely related to acute CHF -Acute respiratory failure resolved.  New acute sytolic heart failure/cardiomyopathy/cardiogenic shock -Echocardiogram showed EF 25-05%, LV diastolic parameters indeterminate.  Only base of left ventricle moves well.  RV moderately reduced systolic function. -LVEF 65% on 7/19. -Previous cath with nonobstructive CAD. -Cardiology consultation and follow-up appreciated. -Treated with IV furosemide 80 mg twice daily.  IV Lasix stopped 6/3.  Defer diuretic management to Cardiology. - - 9 L since admission.  Weight down from 168 pounds to 153 pounds. -Continue digoxin, spironolactone and Entresto.  No beta-blockers due to low output. -Clinically euvolemic. -Repeat cardiac cath 6/4 shows severe three-vessel CAD.  Seen by thoracic surgery but patient declined surgery.  Cardiology may consider PCI.  Chest  tightness/elevated troponin/severe three-vessel CAD -Suspect due to decompensated CHF. -Troponin peaked to 0.36 but plateaued.  Elevated troponin likely due to demand ischemia from A. fib with RVR and acute CHF. -Previous cath with nonobstructive CAD and plan for repeat cath this afternoon. - Repeat cardiac cath 6/4 shows severe three-vessel CAD.  Seen by thoracic surgery but patient declined surgery.  Cardiology may consider PCI.  TIA -Patient presented with a brief episode of altered mental status, slurred speech, unsteady gait -CT head with no acute findings -CTA head and neck with significant right carotid artery narrowing -MRI showed no acute or subacute finding -Carotid Doppler:Bilateral ICA 1 to 39% stenosis bilateral vertebral arteriesdemonstrate antegrade flow -History of atrial fibrillation on anticoagulation with Coumadin -Echocardiogram results as above. -LDL55, Hemoglobin A1c6.9 -Neurology consulted and appreciated, recommended vascular consultation  -Continue aspirin, statin.  Coumadin held for potential cardiac cath.  INR 1.8 and IV heparin initiated per pharmacy. -PT assessed patient and recommended no further therapy  Carotid artery stenosis -CTA head and neck showed significant stenosis R ICA -History of left carotid endarterectomy -Vascular surgery follow-up from 6/2 appreciated.  Plans for right carotid endarterectomy when cardiac and pulmonary status optimized. -Continue aspirin and statins.  Essential hypertension -Controlled.  Carvedilol and lisinopril held. -Currently on Entresto, Aldactone.  IV Lasix stopped 6/3.  Hyperlipidemia -Takes Zetia, fenofibrate, pravastatin -lipid panel: total cholesterol 95, HDL 17, LDL 55, TG 113 -patient has not been on welchol in months  Diabetes mellitus, type II -CBGs mildly uncontrolled and fluctuating. -A1c 6.9.   -Continue SSI moderate sensitivity along with bedtime scale.   -DM coordinator input appreciated.   Added NovoLog 4 units mealtime. -on glipizide, tradjenta, metformin  at home, currently on hold.  Chronic atrial fibrillation, now with RVR -Suspect RVR secondary to respiratory status -CHADSVASC 8 -Coreg held to left permissive hypertension -Continue digoxin -INR 1.6 -Coumadin held, started IV heparin for INR <2.  Remains on IV heparin while interventions contemplated by cardiology. -Controlled ventricular rate.  Upper thoracic adenopathy -found on CTA head/neck -CT chest: Multiple enlarged medium sized mediastinal lymph nodes.  Differential would include reactive adenopathy versus lymphoproliferative process. - Recommended repeat CT chest, abdomen pelvis with contrast in 1 month.  NSVT Vs A. fib with aberrancy -Noted 28 beat wide complex nonsustained tachycardia on 6/2 at 3:07 AM. -Continue monitoring on telemetry. -Replace potassium to >4.  Magnesium 2.1. -Cardiology to consider starting beta-blockers. -No recurrence noted.  Hypokalemia -Replaced.  Magnesium 2.1.   DVT prophylaxis: Coumadin held.  Currently on IV heparin drip. Code Status: Full Family Communication: Patient declined this MDs offer to call his spouse to update care.  He states that cardiology is already speaking with his wife. Disposition: To be determined pending further evaluation and management by cardiology.   Consultants:  Neurology Vascular Surgery Cardiology- CHF team  Procedures:  Carotid doppler Echocardiogram RUE PICC line  Cardiac cath 6/4: Assessment: 1. Severe 3v CAD in left dominant system with 70-80% LM lesion and 99% proximal LAD lesion 2. Ischemic CM EF 25% 3. Mild to moderate pulmonary HTN 4. Moderately reduced CO  Antimicrobials:  None   Subjective: Patient seen this morning prior to thoracic surgery input.  Denied complaints.  No dyspnea or chest pain.  ROS: As above, otherwise negative.  Objective:  Vitals:   07/30/18 0306 07/30/18 0841 07/30/18 0906 07/30/18  1230  BP: 132/80 (!) 122/54    Pulse: 96  90 (!) 108  Resp: 20     Temp: 97.8 F (36.6 C) (!) 97.3 F (36.3 C)  97.7 F (36.5 C)  TempSrc: Axillary Oral  Oral  SpO2: 97% 98%    Weight:      Height:        Examination:  General exam: Pleasant elderly male, moderately built and nourished seen ambulating comfortably in the room. Respiratory system: Clear to auscultation.  No increased work of breathing.  Stable. Cardiovascular system: S1 & S2 heard, irregularly irregular. No JVD, murmurs, rubs, gallops or clicks. No pedal edema.  Telemetry personally reviewed: A. fib with controlled ventricular rate. Gastrointestinal system: Abdomen is nondistended, soft and nontender. No organomegaly or masses felt. Normal bowel sounds heard.  Stable. Central nervous system: Alert and oriented. No focal neurological deficits. Extremities: Symmetric 5 x 5 power. Skin: No rashes, lesions or ulcers Psychiatry: Judgement and insight appear normal. Mood & affect appropriate.     Data Reviewed: I have personally reviewed following labs and imaging studies  CBC: Recent Labs  Lab 07/24/18 1508 07/26/18 1008 07/27/18 0021 07/28/18 0333 07/29/18 1637 07/29/18 1641 07/29/18 1642 07/30/18 0553  WBC 8.5 11.0* 10.9* 10.9*  --   --   --  9.7  NEUTROABS 6.3  --   --   --   --   --   --   --   HGB 12.3* 13.3 13.7 14.1 15.3 16.0 16.0 13.9  HCT 39.4 41.7 41.9 43.9 45.0 47.0 47.0 43.3  MCV 86.6 83.1 82.5 82.8  --   --   --  83.8  PLT 386 452* 475* 443*  --   --   --  425*   Basic Metabolic Panel: Recent Labs  Lab 07/26/18 1008 07/27/18 0021  07/28/18 0333 07/29/18 0451 07/29/18 1637 07/29/18 1641 07/29/18 1642 07/30/18 0553  NA 136 133* 138 137 140 139 139 137  K 3.8 3.4* 3.0* 3.9 3.5 3.6 3.7 3.8  CL 100 93* 92* 97*  --   --   --  99  CO2 21* 25 31 30   --   --   --  27  GLUCOSE 153* 213* 167* 161*  --   --   --  152*  BUN 25* 32* 29* 26*  --   --   --  23  CREATININE 1.23 1.37* 1.16 1.17  --    --   --  1.08  CALCIUM 9.1 8.7* 8.7* 8.9  --   --   --  9.2  MG  --   --  2.1  --   --   --   --   --    Liver Function Tests: Recent Labs  Lab 07/24/18 1508  AST 30  ALT 20  ALKPHOS 33*  BILITOT 0.8  PROT 6.7  ALBUMIN 3.8   Coagulation Profile: Recent Labs  Lab 07/26/18 0800 07/27/18 0021 07/28/18 0333 07/29/18 0451 07/30/18 0553  INR 2.6* 2.7* 2.2* 1.8* 1.6*   Cardiac Enzymes: Recent Labs  Lab 07/26/18 1008 07/26/18 1229 07/26/18 1706 07/27/18 0021  TROPONINI 0.08* 0.23* 0.36* 0.35*   HbA1C: No results for input(s): HGBA1C in the last 72 hours. CBG: Recent Labs  Lab 07/29/18 1138 07/29/18 1729 07/29/18 2131 07/30/18 0609 07/30/18 1240  GLUCAP 228* 178* 365* 161* 250*    Recent Results (from the past 240 hour(s))  SARS Coronavirus 2 (CEPHEID - Performed in [place] hospital lab), Hosp Order     Status: None   Collection Time: 07/24/18  4:48 PM  Result Value Ref Range Status   SARS Coronavirus 2 NEGATIVE NEGATIVE Final    Comment: (NOTE) If result is NEGATIVE SARS-CoV-2 target nucleic acids are NOT DETECTED. The SARS-CoV-2 RNA is generally detectable in upper and lower  respiratory specimens during the acute phase of infection. The lowest  concentration of SARS-CoV-2 viral copies this assay can detect is 250  copies / mL. A negative result does not preclude SARS-CoV-2 infection  and should not be used as the sole basis for treatment or other  patient management decisions.  A negative result may occur with  improper specimen collection / handling, submission of specimen other  than nasopharyngeal swab, presence of viral mutation(s) within the  areas targeted by this assay, and inadequate number of viral copies  (<250 copies / mL). A negative result must be combined with clinical  observations, patient history, and epidemiological information. If result is POSITIVE SARS-CoV-2 target nucleic acids are DETECTED. The SARS-CoV-2 RNA is generally  detectable in upper and lower  respiratory specimens dur ing the acute phase of infection.  Positive  results are indicative of active infection with SARS-CoV-2.  Clinical  correlation with patient history and other diagnostic information is  necessary to determine patient infection status.  Positive results do  not rule out bacterial infection or co-infection with other viruses. If result is PRESUMPTIVE POSTIVE SARS-CoV-2 nucleic acids MAY BE PRESENT.   A presumptive positive result was obtained on the submitted specimen  and confirmed on repeat testing.  While 2019 novel coronavirus  (SARS-CoV-2) nucleic acids may be present in the submitted sample  additional confirmatory testing may be necessary for epidemiological  and / or clinical management purposes  to differentiate between  SARS-CoV-2 and other Sarbecovirus  currently known to infect humans.  If clinically indicated additional testing with an alternate test  methodology [phone]) is advised. The SARS-CoV-2 RNA is generally  detectable in upper and lower respiratory sp ecimens during the acute  phase of infection. The expected result is Negative. Fact Sheet for Patients:  StrictlyIdeas.no Fact Sheet for Healthcare Providers: BankingDealers.co.za This test is not yet approved or cleared by the [place] FDA and has been authorized for detection and/or diagnosis of SARS-CoV-2 by FDA under an Emergency Use Authorization (EUA).  This EUA will remain in effect (meaning this test can be used) for the duration of the COVID-19 declaration under Section 564(b)(1) of the Act, 21 U.S.C. section 360bbb-3(b)(1), unless the authorization is terminated or revoked sooner. Performed at [facility], [address] [place], [place] [address]          Radiology Studies: No results found.      Scheduled Meds: . aspirin EC  81 mg Oral Daily  . digoxin  0.125 mg Oral Daily  .  ezetimibe  10 mg Oral Daily  . fenofibrate  160 mg Oral Daily  . insulin aspart  0-15 Units Subcutaneous TID WC  . insulin aspart  0-5 Units Subcutaneous QHS  . multivitamin with minerals  1 tablet Oral Daily  . omega-3 acid ethyl esters  2 g Oral BID  . pravastatin  80 mg Oral Daily  . sacubitril-valsartan  1 tablet Oral BID  . sodium chloride flush  10-40 mL Intracatheter Q12H  . sodium chloride flush  3 mL Intravenous Q12H  . sodium chloride flush  3 mL Intravenous Q12H  . spironolactone  12.5 mg Oral Daily   Continuous Infusions: . sodium chloride    . heparin 1,300 Units/hr (07/30/18 1031)     LOS: 5 days     Vernell Leep, MD, FACP, [facility]. [facility]  To contact the attending provider between 7A-7P or the covering provider during after hours 7P-7A, please log into the web site [email or web address] and access using universal [place] password for that web site. If you do not have the password, please call the hospital operator.  07/30/2018, 1:31 PM

## 2018-07-30 NOTE — Plan of Care (Signed)
  Problem: Education: Goal: Knowledge of General Education information will improve Description: Including pain rating scale, medication(s)/side effects and non-pharmacologic comfort measures Outcome: Progressing   Problem: Health Behavior/Discharge Planning: Goal: Ability to manage health-related needs will improve Outcome: Progressing   Problem: Clinical Measurements: Goal: Ability to maintain clinical measurements within normal limits will improve Outcome: Progressing Goal: Will remain free from infection Outcome: Progressing Goal: Diagnostic test results will improve Outcome: Progressing Goal: Respiratory complications will improve Outcome: Progressing Goal: Cardiovascular complication will be avoided Outcome: Progressing   Problem: Activity: Goal: Risk for activity intolerance will decrease Outcome: Progressing   Problem: Nutrition: Goal: Adequate nutrition will be maintained Outcome: Progressing   Problem: Coping: Goal: Level of anxiety will decrease Outcome: Progressing   Problem: Elimination: Goal: Will not experience complications related to bowel motility Outcome: Progressing Goal: Will not experience complications related to urinary retention Outcome: Progressing   Problem: Pain Managment: Goal: General experience of comfort will improve Outcome: Progressing   Problem: Safety: Goal: Ability to remain free from injury will improve Outcome: Progressing   Problem: Skin Integrity: Goal: Risk for impaired skin integrity will decrease Outcome: Progressing   Problem: Education: Goal: Knowledge of disease or condition will improve Outcome: Progressing Goal: Knowledge of secondary prevention will improve Outcome: Progressing Goal: Knowledge of patient specific risk factors addressed and post discharge goals established will improve Outcome: Progressing Goal: Individualized Educational Video(s) Outcome: Progressing   Problem: Health Behavior/Discharge  Planning: Goal: Ability to manage health-related needs will improve Outcome: Progressing   Problem: Ischemic Stroke/TIA Tissue Perfusion: Goal: Complications of ischemic stroke/TIA will be minimized Outcome: Progressing   

## 2018-07-30 NOTE — Progress Notes (Signed)
Patient ID: Oscar [name], male   DOB: 06/25/1940, 78 y.o.   MRN: [id number] Following from sidelines.  Cardiac cath results noted.  No new neuro symptoms.  We will see again next week

## 2018-07-30 NOTE — Progress Notes (Signed)
Inpatient Diabetes Program Recommendations  AACE/ADA: New Consensus Statement on Inpatient Glycemic Control (2015)  Target Ranges:  Prepandial:   less than 140 mg/dL      Peak postprandial:   less than 180 mg/dL (1-2 hours)      Critically ill patients:  140 - 180 mg/dL   Results for ANHAD, SHEELEY (MRN [id number]) as of 07/30/2018 11:28  Ref. Range 07/29/2018 06:32 07/29/2018 11:38 07/29/2018 17:29 07/29/2018 21:31 07/30/2018 06:09  Glucose-Capillary Latest Ref Range: 70 - 99 mg/dL 183 (H) 228 (H) 178 (H) 365 (H) 161 (H)    Review of Glycemic Control  Diabetes history: DM 2 Outpatient Diabetes medications: Glipizide 10 mg bid, metformin 500 mg bid, Tradjenta 5 mg daily Current orders for Inpatient glycemic control: Novolog 0-15 units tid, Novolog 0-5 units qhs  A1c 6.9% on 5/31  Inpatient Diabetes Program Recommendations:    Glucose trends increase after meals, Consider Novolog 4 units tid meal coverage if patient consumes at least 50% of meals.  Thanks,  Tama Headings RN, MSN, BC-ADM Inpatient Diabetes Coordinator Team Pager [phone] (8a-5p)

## 2018-07-30 NOTE — Progress Notes (Addendum)
ANTICOAGULATION CONSULT NOTE  Pharmacy Consult:  Heparin Indication: atrial fibrillation, MV CAD  Allergies  Allergen Reactions  . Penicillins Hives    Blisters on hands  . Statins     Joint and muscle pain and low energy.    Patient Measurements: Height: 5\' 6"  (167.6 cm) Weight: 153 lb 3.2 oz (69.5 kg) IBW/kg (Calculated) : 63.8  HEPARIN DW (KG): 77  Vital Signs: Temp: 97.7 F (36.5 C) (06/05 1230) Temp Source: Oral (06/05 1230) BP: 122/54 (06/05 0841) Pulse Rate: 108 (06/05 1230)  Labs: Recent Labs    07/28/18 0333 07/29/18 0451  07/29/18 1641 07/29/18 1642 07/30/18 0553 07/30/18 0922 07/30/18 1820  HGB 14.1  --    < > 16.0 16.0 13.9  --   --   HCT 43.9  --    < > 47.0 47.0 43.3  --   --   PLT 443*  --   --   --   --  454*  --   --   LABPROT 24.2* 20.5*  --   --   --  19.0*  --   --   INR 2.2* 1.8*  --   --   --  1.6*  --   --   HEPARINUNFRC  --   --   --   --   --   --  0.12* 0.34  CREATININE 1.16 1.17  --   --   --  1.08  --   --    < > = values in this interval not displayed.    Estimated Creatinine Clearance: 50 mL/min (by C-G formula based on SCr of 1.08 mg/dL).   Assessment: 56 YOM here with AMS and TIA. He is on warfarin PTA for afib and pharmacy was consulted to dose.  Patient is also on a heparin bridge while INR is sub-therapeutic.  Heparin level is therapeutic; no bleeding reported.  Goal of Therapy:  Heparin level 0.3-0.7 units/mL Monitor platelets by anticoagulation protocol: Yes   Plan:  Continue heparin gtt at 1300 units/hr F/U AM labs  Leea Rambeau D. Mina Marble, PharmD, BCPS, [place] 07/30/2018, 7:55 PM

## 2018-07-30 NOTE — Progress Notes (Signed)
Occupational Therapy Treatment and Discharge from OT Patient Details Name: Oscar [name] MRN: [id number] DOB: 01/18/1941 Today's Date: 07/30/2018    History of present illness Pt is a 78 y/o male presenting to ED for evaluation of transient episode of AMS, slurred speech, balance dificulty, lasting approx 1 hr then resolving in ED. CT and MRI with no acute abnormalities. Pt transferred to step down on on 6/1 due to decline in respiratory status. Pt to have cardiac cath for clearance for rt carotid endarterectomy. PMH: anemia, afib, CHF, COPD, DM, tremors of the nervous system, L carotid endarterectomy.    OT comments  Pt has met all OT goals. Pt was able to perform all ADL, transfers, and cognitive assessments independently and safely. Pt eager to return home. OT will sign off at this time. Thank you for the opportunity to serve this patient.    Follow Up Recommendations  No OT follow up;Supervision - Intermittent    Equipment Recommendations  None recommended by OT    Recommendations for Other Services      Precautions / Restrictions Restrictions Weight Bearing Restrictions: No       Mobility Bed Mobility Overal bed mobility: Independent                Transfers Overall transfer level: Independent                    Balance Overall balance assessment: Independent                                         ADL either performed or assessed with clinical judgement   ADL Overall ADL's : Independent                                       General ADL Comments: Pt able to mobilize in room, perform LB dressing, transfers, sink level grooming (extended) without LOB or any physical assist or cues     Vision       Perception     Praxis      Cognition Arousal/Alertness: Awake/alert Behavior During Therapy: WFL for tasks assessed/performed Overall Cognitive Status: Within Functional Limits for tasks assessed                                           Exercises     Shoulder Instructions       General Comments VSS throughout session    Pertinent Vitals/ Pain       Pain Assessment: No/denies pain  Home Living                                          Prior Functioning/Environment              Frequency  Min 2X/week        Progress Toward Goals  OT Goals(current goals can now be found in the care plan section)  Progress towards OT goals: Goals met/education completed, patient discharged from OT  Acute Rehab OT Goals Patient Stated Goal: to return home OT Goal Formulation: With patient Time For Goal  Achievement: 08/08/18 Potential to Achieve Goals: Good  Plan All goals met and education completed, patient discharged from OT services    Co-evaluation                 AM-PAC OT "6 Clicks" Daily Activity     Outcome Measure   Help from another person eating meals?: None Help from another person taking care of personal grooming?: A Little Help from another person toileting, which includes using toliet, bedpan, or urinal?: A Little Help from another person bathing (including washing, rinsing, drying)?: A Little Help from another person to put on and taking off regular upper body clothing?: None Help from another person to put on and taking off regular lower body clothing?: A Little 6 Click Score: 20    End of Session Equipment Utilized During Treatment: Gait belt  OT Visit Diagnosis: Unsteadiness on feet (R26.81);Other (comment)(decreased activity tolerance)   Activity Tolerance Patient tolerated treatment well   Patient Left in bed;with call bell/phone within reach   Nurse Communication Mobility status        Time: 8260-8883 OT Time Calculation (min): 16 min  Charges: OT General Charges $OT Visit: 1 Visit OT Treatments $Self Care/Home Management : 8-22 mins  Hulda Humphrey OTR/L Acute Rehabilitation Services Pager:  [phone] Office: [place] 07/30/2018, 5:25 PM

## 2018-07-30 NOTE — Progress Notes (Signed)
Advanced Heart Failure Rounding Note   Subjective:    Cath 6/5 showed 99% prox LAD lesion and moderate LM lesion (felt to be non-critical) . EF 25% RHC ok   RA = 7 RV = 59/6 PA = 59/24 (37) PCW = 22 Fick cardiac output/index = 4.1/2.3 PVR = 3.3 WU Ao sat = 93% PA sat = 62%, 64%  Denies CP or SOB. Refuses CABG eval. Co-ox this am 50% CVP 8  Objective:   Weight Range:  Vital Signs:   Temp:  [97.3 F (36.3 C)-98.3 F (36.8 C)] 97.7 F (36.5 C) (06/05 1230) Pulse Rate:  [82-118] 108 (06/05 1230) Resp:  [11-28] 20 (06/05 0306) BP: (101-160)/(54-91) 122/54 (06/05 0841) SpO2:  [89 %-98 %] 98 % (06/05 0841) Last BM Date: 07/29/18  Weight change: Filed Weights   07/24/18 2110 07/27/18 0500 07/28/18 0649  Weight: 77 kg 73.3 kg 69.5 kg    Intake/Output:   Intake/Output Summary (Last 24 hours) at 07/30/2018 1443 Last data filed at 07/30/2018 0908 Gross per 24 hour  Intake 980.7 ml  Output 1300 ml  Net -319.3 ml     Physical Exam: General:  Elderly Weak appearing. No resp difficulty HEENT: normal Neck: supple. JVP 8 Carotids 2+ bilat; no bruits. No lymphadenopathy or thryomegaly appreciated. Cor: PMI nondisplaced. Irregular rate & rhythm. No rubs, gallops or murmurs. Lungs: clear Abdomen: soft, nontender, nondistended. No hepatosplenomegaly. No bruits or masses. Good bowel sounds. Extremities: no cyanosis, clubbing, rash, edema Neuro: alert & orientedx3, cranial nerves grossly intact. moves all 4 extremities w/o difficulty. Affect pleasant  Telemetry: AF 90-100 Personally reviewed   Labs: Basic Metabolic Panel: Recent Labs  Lab 07/26/18 1008 07/27/18 0021 07/28/18 0333 07/29/18 0451 07/29/18 1637 07/29/18 1641 07/29/18 1642 07/30/18 0553  NA 136 133* 138 137 140 139 139 137  K 3.8 3.4* 3.0* 3.9 3.5 3.6 3.7 3.8  CL 100 93* 92* 97*  --   --   --  99  CO2 21* 25 31 30   --   --   --  27  GLUCOSE 153* 213* 167* 161*  --   --   --  152*  BUN 25* 32* 29*  26*  --   --   --  23  CREATININE 1.23 1.37* 1.16 1.17  --   --   --  1.08  CALCIUM 9.1 8.7* 8.7* 8.9  --   --   --  9.2  MG  --   --  2.1  --   --   --   --   --     Liver Function Tests: Recent Labs  Lab 07/24/18 1508  AST 30  ALT 20  ALKPHOS 33*  BILITOT 0.8  PROT 6.7  ALBUMIN 3.8   No results for input(s): LIPASE, AMYLASE in the last 168 hours. No results for input(s): AMMONIA in the last 168 hours.  CBC: Recent Labs  Lab 07/24/18 1508 07/26/18 1008 07/27/18 0021 07/28/18 0333 07/29/18 1637 07/29/18 1641 07/29/18 1642 07/30/18 0553  WBC 8.5 11.0* 10.9* 10.9*  --   --   --  9.7  NEUTROABS 6.3  --   --   --   --   --   --   --   HGB 12.3* 13.3 13.7 14.1 15.3 16.0 16.0 13.9  HCT 39.4 41.7 41.9 43.9 45.0 47.0 47.0 43.3  MCV 86.6 83.1 82.5 82.8  --   --   --  83.8  PLT 386  452* 475* 443*  --   --   --  454*    Cardiac Enzymes: Recent Labs  Lab 07/26/18 1008 07/26/18 1229 07/26/18 1706 07/27/18 0021  TROPONINI 0.08* 0.23* 0.36* 0.35*    BNP: BNP (last 3 results) Recent Labs    07/21/18 1754  BNP 2,433.0*    ProBNP (last 3 results) No results for input(s): PROBNP in the last 8760 hours.    Other results:  Imaging: No results found.   Medications:     Scheduled Medications: . aspirin EC  81 mg Oral Daily  . digoxin  0.125 mg Oral Daily  . ezetimibe  10 mg Oral Daily  . fenofibrate  160 mg Oral Daily  . insulin aspart  0-15 Units Subcutaneous TID WC  . insulin aspart  0-5 Units Subcutaneous QHS  . insulin aspart  4 Units Subcutaneous TID WC  . multivitamin with minerals  1 tablet Oral Daily  . omega-3 acid ethyl esters  2 g Oral BID  . pravastatin  80 mg Oral Daily  . sacubitril-valsartan  1 tablet Oral BID  . sodium chloride flush  10-40 mL Intracatheter Q12H  . sodium chloride flush  3 mL Intravenous Q12H  . sodium chloride flush  3 mL Intravenous Q12H  . spironolactone  12.5 mg Oral Daily    Infusions: . sodium chloride    .  heparin 1,300 Units/hr (07/30/18 1344)    PRN Medications: sodium chloride, acetaminophen **OR** acetaminophen (TYLENOL) oral liquid 160 mg/5 mL **OR** acetaminophen, albuterol, calcium carbonate, hydrALAZINE, ipratropium-albuterol, ondansetron (ZOFRAN) IV, senna-docusate, sodium chloride flush, sodium chloride flush   Assessment/Plan:   1. Acute systolic HF -> cardiogenic shock  - Echo this admit EF 20-25% with RWMA - Echo 7/19 EF 65% - Cath on 6/5: Left dominant system  LM 50-60% LAD 99%p LCx 60% RCA non-dominant occluded - Patient refusing CABG eval. Reviewed films with Dr. Burt Knack and feels LAD is more like a CTO. cMRI ordered to look for viability. If significant viability consider high-risk PCI with atherectomy and Impella support - Volume status improved. Weight down 15 pounds. CVP 7-8 today.  - RHC ok but co-ox back down today. Will follow.  - Continue digoxin, spiro, Entresto 24/26.  - No b-blocker yet wiuth low output  2. CAD - plan as above - continue ASA, statin. Possible LAD PCI pending results of cMRI. - Once warfarin back on board can stop ASA  3. Acute hypoxic respiratory failure - Improved with diuresis  4. Chronic AF - rate ok  - off warfarin for cath - on heparin with INR < 2.0 (no bolus) no bleeding Discussed dosing with PharmD personally. - Will not restart warfarin until see results of cMRI  5. TIA with left carotid stenosis - VVS following for CEA when optimized  6. DM2 - per primary team   Length of Stay: 5   Glori Bickers MD 07/30/2018, 2:43 PM  Advanced Heart Failure Team Pager [phone] (M-F; [place])  Please contact [facility] for night-coverage after hours (4p -7a ) and weekends on [email or web address]

## 2018-07-30 NOTE — Progress Notes (Signed)
Physical Therapy Treatment Patient Details Name: Oscar [name] MRN: [id number] DOB: 1940-09-03 Today's Date: 07/30/2018    History of Present Illness Pt is a 78 y/o male presenting to ED for evaluation of transient episode of AMS, slurred speech, balance dificulty, lasting approx 1 hr then resolving in ED. CT and MRI with no acute abnormalities. Pt transferred to step down on on 6/1 due to decline in respiratory status. Pt to have cardiac cath for clearance for rt carotid endarterectomy. PMH: anemia, afib, CHF, COPD, DM, tremors of the nervous system, L carotid endarterectomy.     PT Comments    Pt is mobilizing at a modified I to Independent level in a homelike environment.  All goals met.  Pt just needs help with the lines while in the hospital.  No further PT needs.  Will signoff at this time.   Follow Up Recommendations  No PT follow up     Equipment Recommendations  None recommended by PT    Recommendations for Other Services       Precautions / Restrictions Restrictions Weight Bearing Restrictions: No    Mobility  Bed Mobility Overal bed mobility: Independent                Transfers Overall transfer level: Independent                  Ambulation/Gait Ambulation/Gait assistance: Independent Gait Distance (Feet): 700 Feet Assistive device: None Gait Pattern/deviations: Step-through pattern Gait velocity: moderate Gait velocity interpretation: >2.62 ft/sec, indicative of community ambulatory General Gait Details: steady, no overt deviation even with changes in cadence, scanning, backing up, abrupt directional changes.   VSS throughout   Stairs Stairs: Yes Stairs assistance: Modified independent (Device/Increase time) Stair Management: One rail Left;Alternating pattern;Forwards Number of Stairs: 4 General stair comments: safe with the rail   Wheelchair Mobility    Modified Rankin (Stroke Patients Only)       Balance Overall balance  assessment: Independent                                          Cognition Arousal/Alertness: Awake/alert Behavior During Therapy: WFL for tasks assessed/performed Overall Cognitive Status: Within Functional Limits for tasks assessed                                        Exercises      General Comments General comments (skin integrity, edema, etc.): VSS throughout session      Pertinent Vitals/Pain Pain Assessment: No/denies pain    Home Living                      Prior Function            PT Goals (current goals can now be found in the care plan section) Acute Rehab PT Goals Patient Stated Goal: to return home PT Goal Formulation: With patient Potential to Achieve Goals: Good Progress towards PT goals: Progressing toward goals;Goals met/education completed, patient discharged from PT    Frequency    Min 3X/week      PT Plan Current plan remains appropriate    Co-evaluation              AM-PAC PT "6 Clicks" Mobility   Outcome Measure  Help needed  turning from your back to your side while in a flat bed without using bedrails?: None Help needed moving from lying on your back to sitting on the side of a flat bed without using bedrails?: None Help needed moving to and from a bed to a chair (including a wheelchair)?: None Help needed standing up from a chair using your arms (e.g., wheelchair or bedside chair)?: None Help needed to walk in hospital room?: None Help needed climbing 3-5 steps with a railing? : None 6 Click Score: 24    End of Session   Activity Tolerance: Patient tolerated treatment well Patient left: in chair;with call bell/phone within reach Nurse Communication: Mobility status PT Visit Diagnosis: Unsteadiness on feet (R26.81)     Time: 7897-8478 PT Time Calculation (min) (ACUTE ONLY): 19 min  Charges:  $Gait Training: 8-22 mins                     07/30/2018  Donnella Sham, PT Acute  Rehabilitation Services [phone]  (pager) [phone]  (office)   Tessie Fass Zyaira Vejar 07/30/2018, 5:46 PM

## 2018-07-30 NOTE — Consult Note (Addendum)
[address]       [place] [address]             [phone]        Anddy E Goedecke [facility] Medical Record #[id number] Date of Birth: Jul 13, 1940  Referring: Bensimhon Primary Care: No primary care provider on file. Primary Cardiologist:James Hochrein, MD  Chief Complaint:    Chief Complaint  Patient presents with  . Altered Mental Status    History of Present Illness:      Oscar [name] is a 78 yo white male with known history of Atrial Fibrillation, Cardiomyopathy, CHF, COPD, DM, Dyslipidemia, and Carotid Artery Stenosis.  He presented to the Emergency Department at [facility] on 07/21/2018.  He presented with complaints of shortness of breath and increasing lower extremity swelling.  This had been occurring over the past several weeks.  He also had symptoms of a "wet cough."  Workup in the ED confirmed CHF exacerbation.  The patient refused admission at that time and was treated with IV Lasix and instructed to increase his home regimen of lasix and follow up with his Cardiologist.  He again presented to the ED on 07/24/2018 with altered mental status and slurred speech.  He was brought in by his wife who stated the patient became disoriented and was unable to stand without losing his balance.  He was unable to remember his bank pin at the gas station as well.  Workup in the ED showed the patient to be feeling better.  It was felt he likely had suffered at TIA.  He was admitted for further workup.  CTA of the head and neck was obtained and showed significant stenosis in the left carotid with previous endarterectomy.  There was also severe stenosis of the right ICA.  There was no evidence of acute stroke.  A carotid artery duplex was obtained and confirmed the presence of stenosis.  Vascular surgery has been consulted and recommended intervention on his right carotid, however due to his low EF on Echocardiogram Cardiology workup would be needed.  He was evaluated  by Dr. Haroldine Laws who recommended cardiac catheterization to ensure no CAD.  This was performed on 6/4 and showed multivessel CAD.  It was felt coronary bypass grafting would be indicated and TCTS consult has been requested.   The patient is currently chest pain free.  He denies shortness of breath.  States his lower extremity edema is improved.  He states he is not having bypass surgery at his age and he does not want his bone cut.   Current Activity/ Functional Status: Patient is independent with mobility/ambulation, transfers, ADL's, IADL's.   Zubrod Score: At the time of surgery this patient's most appropriate activity status/level should be described as: []     0    Normal activity, no symptoms []     1    Restricted in physical strenuous activity but ambulatory, able to do out light work []     2    Ambulatory and capable of self care, unable to do work activities, up and about                 more than 50%  Of the time                            []     3    Only limited self care, in bed greater than 50% of  waking hours []     4    Completely disabled, no self care, confined to bed or chair []     5    Moribund  Past Medical History:  Diagnosis Date  . Anemia   . Atrial fibrillation ([place])    permanent  . Cardiomyopathy    EF 35% in the past--Improved to 55% echo 2/10. Moderately severe mitral regurgitation in the past, now improved,  . Carotid artery stenosis   . CHF (congestive heart failure) ([place]) 2002  . COPD (chronic obstructive pulmonary disease) ([place])   . Coronary artery disease    non obstructive  . Diabetes mellitus   . Dyslipidemia   . History of tobacco abuse   . Nephrolithiasis   . Tremors of nervous system     Past Surgical History:  Procedure Laterality Date  . CAROTID ENDARTERECTOMY Left 10-21-05   cea  . left carotid endarterectomy    . RIGHT/LEFT HEART CATH AND CORONARY ANGIOGRAPHY N/A 07/29/2018   Procedure: RIGHT/LEFT HEART CATH AND CORONARY ANGIOGRAPHY;   Surgeon: Jolaine Artist, MD;  Location: [facility];  Service: Cardiovascular;  Laterality: N/A;  . TONSILLECTOMY      Social History   Tobacco Use  Smoking Status Former Smoker  . Types: Cigarettes  . Last attempt to quit: 02/25/2000  . Years since quitting: 18.4  Smokeless Tobacco Former Systems developer    Social History   Substance and Sexual Activity  Alcohol Use No     Allergies  Allergen Reactions  . Penicillins Hives    Blisters on hands  . Statins     Joint and muscle pain and low energy.    Current Facility-Administered Medications  Medication Dose Route Frequency Provider Last Rate Last Dose  . 0.9 %  sodium chloride infusion  250 mL Intravenous PRN Bensimhon, Shaune Pascal, MD      . acetaminophen (TYLENOL) tablet 650 mg  650 mg Oral Q4H PRN Bensimhon, Shaune Pascal, MD       Or  . acetaminophen (TYLENOL) solution 650 mg  650 mg Per Tube Q4H PRN Bensimhon, Shaune Pascal, MD       Or  . acetaminophen (TYLENOL) suppository 650 mg  650 mg Rectal Q4H PRN Bensimhon, Shaune Pascal, MD      . albuterol (PROVENTIL) (2.5 MG/3ML) 0.083% nebulizer solution 2.5 mg  2.5 mg Nebulization Q4H PRN Bensimhon, Shaune Pascal, MD   2.5 mg at 07/26/18 1241  . aspirin EC tablet 81 mg  81 mg Oral Daily Bensimhon, Shaune Pascal, MD   81 mg at 07/29/18 1055  . calcium carbonate (TUMS - dosed in mg elemental calcium) chewable tablet 400 mg of elemental calcium  400 mg of elemental calcium Oral TID PRN Bensimhon, Shaune Pascal, MD   400 mg of elemental calcium at 07/25/18 1457  . digoxin (LANOXIN) tablet 0.125 mg  0.125 mg Oral Daily Bensimhon, Shaune Pascal, MD   0.125 mg at 07/29/18 1106  . ezetimibe (ZETIA) tablet 10 mg  10 mg Oral Daily Bensimhon, Shaune Pascal, MD   10 mg at 07/29/18 1105  . fenofibrate tablet 160 mg  160 mg Oral Daily Bensimhon, Shaune Pascal, MD   160 mg at 07/29/18 1055  . heparin ADULT infusion 100 units/mL (25000 units/244mL sodium chloride 0.45%)  1,050 Units/hr Intravenous Continuous Lyndee Leo, RPH 10.5  mL/hr at 07/29/18 2352 1,050 Units/hr at 07/29/18 2352  . hydrALAZINE (APRESOLINE) injection 5 mg  5 mg Intravenous Q4H PRN Bensimhon, Shaune Pascal,  MD      . insulin aspart (novoLOG) injection 0-15 Units  0-15 Units Subcutaneous TID WC Bensimhon, Shaune Pascal, MD   3 Units at 07/30/18 [phone]  . insulin aspart (novoLOG) injection 0-5 Units  0-5 Units Subcutaneous QHS Bensimhon, Shaune Pascal, MD   5 Units at 07/29/18 2203  . ipratropium-albuterol (DUONEB) 0.5-2.5 (3) MG/3ML nebulizer solution 3 mL  3 mL Nebulization Q2H PRN Bensimhon, Shaune Pascal, MD      . multivitamin with minerals tablet 1 tablet  1 tablet Oral Daily Bensimhon, Shaune Pascal, MD   1 tablet at 07/29/18 1055  . omega-3 acid ethyl esters (LOVAZA) capsule 2 g  2 g Oral BID Bensimhon, Shaune Pascal, MD   2 g at 07/29/18 2121  . ondansetron (ZOFRAN) injection 4 mg  4 mg Intravenous Q6H PRN Bensimhon, Shaune Pascal, MD      . pravastatin (PRAVACHOL) tablet 80 mg  80 mg Oral Daily Bensimhon, Shaune Pascal, MD   80 mg at 07/29/18 1055  . sacubitril-valsartan (ENTRESTO) 24-26 mg per tablet  1 tablet Oral BID Bensimhon, Shaune Pascal, MD   1 tablet at 07/29/18 2121  . senna-docusate (Senokot-S) tablet 1 tablet  1 tablet Oral QHS PRN Bensimhon, Shaune Pascal, MD   1 tablet at 07/29/18 2121  . sodium chloride flush (NS) 0.9 % injection 10-40 mL  10-40 mL Intracatheter Q12H Bensimhon, Shaune Pascal, MD   10 mL at 07/29/18 2204  . sodium chloride flush (NS) 0.9 % injection 10-40 mL  10-40 mL Intracatheter PRN Bensimhon, Shaune Pascal, MD      . sodium chloride flush (NS) 0.9 % injection 3 mL  3 mL Intravenous Q12H Bensimhon, Shaune Pascal, MD   3 mL at 07/28/18 2121  . sodium chloride flush (NS) 0.9 % injection 3 mL  3 mL Intravenous Q12H Bensimhon, Shaune Pascal, MD   3 mL at 07/29/18 2203  . sodium chloride flush (NS) 0.9 % injection 3 mL  3 mL Intravenous PRN Bensimhon, Shaune Pascal, MD      . spironolactone (ALDACTONE) tablet 12.5 mg  12.5 mg Oral Daily Bensimhon, Shaune Pascal, MD   12.5 mg at 07/29/18 1055     Medications Prior to Admission  Medication Sig Dispense Refill Last Dose  . albuterol (PROAIR HFA) 108 (90 BASE) MCG/ACT inhaler Inhale 2 puffs into the lungs.   07/24/2018 at Unknown time  . amLODipine (NORVASC) 2.5 MG tablet Take 1 tablet (2.5 mg total) by mouth daily. 90 tablet 2 07/24/2018 at Unknown time  . colesevelam (WELCHOL) 625 MG tablet Take 1,250 mg by mouth 3 (three) times daily.    07/24/2018 at Unknown time  . digoxin (LANOXIN) 0.125 MG tablet Take 1 tablet (0.125 mg total) by mouth daily. 90 tablet 3 07/24/2018 at Unknown time  . ezetimibe (ZETIA) 10 MG tablet Take 10 mg by mouth daily.     07/24/2018 at Unknown time  . fenofibrate 160 MG tablet Take 160 mg by mouth daily.   07/24/2018 at Unknown time  . furosemide (LASIX) 20 MG tablet Take 0.5 tablets (10 mg total) by mouth daily. 45 tablet 0 07/24/2018 at Unknown time  . furosemide (LASIX) 40 MG tablet Take 1 tablet (40 mg total) by mouth 2 (two) times daily. 30 tablet 0 07/24/2018 at Unknown time  . glipiZIDE (GLUCOTROL XL) 10 MG 24 hr tablet TAKE 1 TABLET BY MOUTH TWICE DAILY   07/24/2018 at Unknown time  . lisinopril (PRINIVIL,ZESTRIL) 40 MG tablet Take 40 mg  by mouth daily.    07/24/2018 at Unknown time  . metFORMIN (GLUCOPHAGE-XR) 500 MG 24 hr tablet Take 500 mg by mouth 2 (two) times daily.    07/24/2018 at Unknown time  . Multiple Vitamin (MULTIVITAMIN) tablet Take 1 tablet by mouth daily.   07/24/2018 at Unknown time  . Omega-3 Fatty Acids (FISH OIL) 1200 MG CAPS Take by mouth 3 (three) times daily.   07/24/2018 at Unknown time  . potassium chloride 20 MEQ TBCR Take 10 mEq by mouth daily. 15 tablet 0 07/24/2018 at Unknown time  . pravastatin (PRAVACHOL) 40 MG tablet Take 2 tablets by mouth daily.   07/24/2018 at Unknown time  . warfarin (COUMADIN) 7.5 MG tablet Take 7.5 mg by mouth daily. Pt takes 1 tab qd on Mon, Wed, Thurs, Sat   Past Week at Unknown time  . linagliptin (TRADJENTA) 5 MG TABS tablet Take 5 mg by mouth daily.     Taking    Family History  Problem Relation Age of Onset  . Coronary artery disease Mother   . Diabetes Mother   . Heart disease Mother        Before age 68 and  CHF  . Hypertension Mother   . Other Mother        varicose veins  . Heart attack Mother   . Heart disease Sister   . Hypertension Sister   . Heart attack Sister   . Heart disease Brother   . Hyperlipidemia Brother   . Hypertension Brother   . Other Brother        varicose veins  . Heart attack Brother   . Heart disease Son 66       Heart Disease before age 38- Open hear surgery  . Deep vein thrombosis Son   . Heart attack Son   . Coronary artery disease Other   . Diabetes Daughter   . Hypertension Daughter   . Other Daughter        varicose veins     Review of Systems:   ROS Pertinent items are noted in HPI.     Cardiac Review of Systems: Y or  [    ]= no  Chest Pain Aqua.Slicker    ]  Resting SOB Jazmín.Cullens   ] Exertional SOB  [ Y ]  Orthopnea [  ]   Pedal Edema [   ]    Palpitations [ Y ] Syncope  [  ]   Presyncope [   ]  General Review of Systems: [Y] = yes [  ]=no Constitional: recent weight change [  ]; anorexia [  ]; fatigue [  ]; nausea [  ]; night sweats [  ]; fever [  ]; or chills [  ]                                                               Dental: Last Dentist visit:   Eye : blurred vision [  ]; diplopia [   ]; vision changes [  ];  Amaurosis fugax[  ]; Resp: cough [  ];  wheezing[  ];  hemoptysis[  ]; shortness of breath[Y ]; paroxysmal nocturnal dyspnea[  ]; dyspnea on exertion[Y  ]; or orthopnea[  ];  GI:  gallstones[  ],  vomiting[  ];  dysphagia[  ]; melena[  ];  hematochezia [  ]; heartburn[  ];   Hx of  Colonoscopy[  ]; GU: kidney stones [  ]; hematuria[  ];   dysuria [  ];  nocturia[  ];  history of     obstruction [  ]; urinary frequency [  ]             Skin: rash, swelling[  ];, hair loss[  ];  peripheral edema[Y  ];  or itching[  ]; Musculosketetal: myalgias[  ];  joint swelling[  ];  joint  erythema[  ];  joint pain[  ];  back pain[  ];  Heme/Lymph: bruising[  ];  bleeding[  ];  anemia[  ];  Neuro: TIA[ Y ];  headaches[  ];  stroke[  ];  vertigo[  ];  seizures[  ];   paresthesias[  ];  difficulty walking[  ];  Psych:depression[  ]; anxiety[  ];  Endocrine: diabetes[Y  ];  thyroid dysfunction[  ];  Physical Exam: BP (!) 122/54 (BP Location: Left Arm)   Pulse 96   Temp (!) 97.3 F (36.3 C) (Oral)   Resp 20   Ht 5\' 6"  (1.676 m)   Wt 69.5 kg   SpO2 97%   BMI 24.73 kg/m    General appearance: alert, cooperative and no distress Head: Normocephalic, without obvious abnormality, atraumatic Neck: + carotid bruit Resp: diminished breath sounds bibasilar Cardio: irregularly irregular rhythm GI: soft, non-tender; bowel sounds normal; no masses,  no organomegaly Extremities: trace, + varicose veins Neurologic: Grossly normal  Diagnostic Studies & Laboratory data:     Recent Radiology Findings:   No results found.   I have independently reviewed the above radiologic studies and discussed with the patient   Recent Lab Findings: Lab Results  Component Value Date   WBC 9.7 07/30/2018   HGB 13.9 07/30/2018   HCT 43.3 07/30/2018   PLT 454 (H) 07/30/2018   GLUCOSE 152 (H) 07/30/2018   CHOL 95 07/25/2018   TRIG 113 07/25/2018   HDL 17 (L) 07/25/2018   LDLCALC 55 07/25/2018   ALT 20 07/24/2018   AST 30 07/24/2018   NA 137 07/30/2018   K 3.8 07/30/2018   CL 99 07/30/2018   CREATININE 1.08 07/30/2018   BUN 23 07/30/2018   CO2 27 07/30/2018   TSH 0.997 06/16/2014   INR 1.6 (H) 07/30/2018   HGBA1C 6.9 (H) 07/25/2018    Assessment / Plan:      1. Cardiomyopathy, CHF, CAD- request for CABG, however patient is refusing surgery, would rather have stent placed... notified Dr. Haroldine Laws 2. Carotid Artery Stenosis- Vascular surgery has seen patient will need endarterectomy in the future 3. Dispo- will sign off, patient doesn't wish to have bypass surgery.  Dr. Prescott Gum  is aware  I  spent 55 minutes counseling the patient face to face.   Oscar Handler, PA-C  07/30/2018 9:01 AM  Patient examined and images of coronary angiogram, echocardiogram and cardiac MRI reviewed and discussed with patient  He has severe ischemic cardiomyopathy with sig scarring and wouldnot benefit from CABG Recommend medical therapy , consideration PCI  patient examined and medical record reviewed,agree with above note. Oscar [name] 07/30/2018

## 2018-07-31 DIAGNOSIS — I4819 Other persistent atrial fibrillation: Secondary | ICD-10-CM

## 2018-07-31 DIAGNOSIS — I5022 Chronic systolic (congestive) heart failure: Secondary | ICD-10-CM

## 2018-07-31 LAB — CBC
HCT: 43.7 % (ref 39.0–52.0)
Hemoglobin: 13.9 g/dL (ref 13.0–17.0)
MCH: 26.6 pg (ref 26.0–34.0)
MCHC: 31.8 g/dL (ref 30.0–36.0)
MCV: 83.7 fL (ref 80.0–100.0)
Platelets: 431 10*3/uL — ABNORMAL HIGH (ref 150–400)
RBC: 5.22 MIL/uL (ref 4.22–5.81)
RDW: 17.1 % — ABNORMAL HIGH (ref 11.5–15.5)
WBC: 9.3 10*3/uL (ref 4.0–10.5)
nRBC: 0 % (ref 0.0–0.2)

## 2018-07-31 LAB — GLUCOSE, CAPILLARY
Glucose-Capillary: 143 mg/dL — ABNORMAL HIGH (ref 70–99)
Glucose-Capillary: 219 mg/dL — ABNORMAL HIGH (ref 70–99)
Glucose-Capillary: 235 mg/dL — ABNORMAL HIGH (ref 70–99)
Glucose-Capillary: 148 mg/dL — ABNORMAL HIGH (ref 70–99)

## 2018-07-31 LAB — BASIC METABOLIC PANEL
Anion gap: 10 (ref 5–15)
BUN: 18 mg/dL (ref 8–23)
CO2: 26 mmol/L (ref 22–32)
Calcium: 9 mg/dL (ref 8.9–10.3)
Chloride: 104 mmol/L (ref 98–111)
Creatinine, Ser: 0.96 mg/dL (ref 0.61–1.24)
GFR calc Af Amer: >60 mL/min (ref >60)
GFR calc non Af Amer: >60 mL/min (ref >60)
Glucose, Bld: 124 mg/dL — ABNORMAL HIGH (ref 70–99)
Potassium: 3.8 mmol/L (ref 3.5–5.1)
Sodium: 140 mmol/L (ref 135–145)

## 2018-07-31 LAB — COOXEMETRY PANEL
Carboxyhemoglobin: 2 % — ABNORMAL HIGH (ref 0.5–1.5)
Methemoglobin: 0.6 % (ref 0.0–1.5)
O2 Saturation: 78.3 %
Total hemoglobin: 14.6 g/dL (ref 12.0–16.0)

## 2018-07-31 LAB — PROTIME-INR
INR: 1.4 — ABNORMAL HIGH (ref 0.8–1.2)
Prothrombin Time: 17.1 seconds — ABNORMAL HIGH (ref 11.4–15.2)

## 2018-07-31 LAB — HEPARIN LEVEL (UNFRACTIONATED): Heparin Unfractionated: 0.41 IU/mL (ref 0.30–0.70)

## 2018-07-31 MED ORDER — CARVEDILOL 3.125 MG PO TABS
3.1250 mg | ORAL_TABLET | Freq: Two times a day (BID) | ORAL | Status: DC
  Administered 2018-07-31 – 2018-08-02 (×5): 3.125 mg via ORAL
  Filled 2018-07-31 (×5): qty 1

## 2018-07-31 MED ORDER — WARFARIN SODIUM 10 MG PO TABS
10.0000 mg | ORAL_TABLET | Freq: Once | ORAL | Status: AC
  Administered 2018-07-31: 10 mg via ORAL
  Filled 2018-07-31: qty 1

## 2018-07-31 MED ORDER — SPIRONOLACTONE 25 MG PO TABS
25.0000 mg | ORAL_TABLET | Freq: Every day | ORAL | Status: DC
  Administered 2018-08-01 – 2018-08-02 (×2): 25 mg via ORAL
  Filled 2018-07-31 (×2): qty 1

## 2018-07-31 MED ORDER — WARFARIN - PHARMACIST DOSING INPATIENT: Freq: Every day | Status: DC

## 2018-07-31 NOTE — Progress Notes (Signed)
PROGRESS NOTE   Oscar [name]  ZOX:[id number]    DOB: [birth year]    DOA: 07/24/2018  PCP: No primary care provider on file.   I have briefly reviewed patients previous medical records in [facility].  Brief Narrative:  78 year old married male, PMH of permanent A. fib on warfarin, COPD on home oxygen 4 L/min at bedtime, chronic systolic CHF with subsequent normalization of EF, nonobstructive CAD, DM 2, HTN, HLD, carotid artery disease status post left CEA in 2007, presented to ED for evaluation of transient episode of AMS, slurred speech and balance difficulty.  He was admitted for suspected TIA and found to have occluded right ICA.  Hospital course complicated by acute new systolic CHF with EF 81-19% and acute respiratory failure with hypoxia.  Cardiology consulted.  Aggressively diuresed when clinically euvolemic.  Underwent cardiac cath that showed severe three-vessel CAD.  Patient declines evaluation for CABG or PCI.  Optimizing medical regimen and possible DC home 6/8.   Assessment & Plan:   Active Problems:   ATRIAL FIBRILLATION   History of left-sided carotid endarterectomy   Carotid artery stenosis   TIA (transient ischemic attack)   Acute on chronic respiratory failure with hypoxia -On home oxygen 4 L/min at bedtime. -Acute respiratory failure likely related to acute CHF -Acute respiratory failure resolved.  New acute sytolic heart failure/cardiomyopathy/cardiogenic shock -Echocardiogram showed EF 14-78%, LV diastolic parameters indeterminate.  Only base of left ventricle moves well.  RV moderately reduced systolic function. -LVEF 65% on 7/19. -Previous cath with nonobstructive CAD. -Cardiology consultation and follow-up appreciated. -Treated with IV furosemide 80 mg twice daily.  IV Lasix stopped 6/3.  Cardiology plans to initiate oral Lasix 6/7. - Diuresed well with IV Lasix and clinically euvolemic. - Continue digoxin, spironolactone and Entresto.  Adding low-dose  carvedilol. - Repeat cardiac cath 6/4 shows severe three-vessel CAD.  Seen by thoracic surgery but patient declined surgery.  Cardiology has discussed risks and benefits of high risk PCI with patient and he has declined that as well.  Also thoracic surgery opined that due to patient's severe ischemic cardiomyopathy with significant scarring, he would not benefit from CABG.  Chest tightness/elevated troponin/severe three-vessel CAD -Suspect due to decompensated CHF. -Troponin peaked to 0.36 but plateaued.  Elevated troponin likely due to demand ischemia from A. fib with RVR and acute CHF. -Previous cath with nonobstructive CAD and plan for repeat cath this afternoon. - Repeat cardiac cath 6/4 shows severe three-vessel CAD.  Patient has currently declined CABG or high risk PCI.  Medical management as noted above.  TIA -Patient presented with a brief episode of altered mental status, slurred speech, unsteady gait -CT head with no acute findings -CTA head and neck with significant right carotid artery narrowing -MRI showed no acute or subacute finding -Carotid Doppler:Bilateral ICA 1 to 39% stenosis bilateral vertebral arteriesdemonstrate antegrade flow -History of atrial fibrillation on anticoagulation with Coumadin -Echocardiogram results as above. -LDL55, Hemoglobin A1c6.9 -Neurology consulted and appreciated, recommended vascular consultation  -Continue aspirin, statin.  Coumadin held for potential cardiac cath.  INR 1.8 and IV heparin initiated per pharmacy. -PT assessed patient and recommended no further therapy  Carotid artery stenosis -CTA head and neck showed significant stenosis R ICA -History of left carotid endarterectomy -Vascular surgery follow-up from 6/5 appreciated.  Plans for right carotid endarterectomy when cardiac and pulmonary status optimized. -Continue aspirin and statins.  -Now that patient is refusing further interventions for his severe three-vessel CAD, will  need to discuss with  vascular surgery on 6/8 if they will plan surgery or not.  Essential hypertension -Controlled.  Lisinopril held.  Carvedilol 3.125 mg twice daily initiated on 6/6. -Currently on Entresto, Aldactone.  IV Lasix stopped 6/3.  Hyperlipidemia -Takes Zetia, fenofibrate, pravastatin -lipid panel: total cholesterol 95, HDL 17, LDL 55, TG 113 -patient has not been on welchol in months  Diabetes mellitus, type II -CBGs mildly uncontrolled and fluctuating. -A1c 6.9.   -Continue SSI moderate sensitivity along with bedtime scale.   -DM coordinator input appreciated.  Added NovoLog 4 units mealtime. -on glipizide, tradjenta, metformin at home, currently on hold.  Chronic atrial fibrillation, now with RVR -Suspect RVR secondary to respiratory status -CHADSVASC 8 -Coreg held to left permissive hypertension -Continue digoxin -INR 1.6 -Coumadin held, started IV heparin for INR <2.  Remains on IV heparin bridging and Coumadin initiated 6/6. -Controlled ventricular rate.  Upper thoracic adenopathy -found on CTA head/neck -CT chest: Multiple enlarged medium sized mediastinal lymph nodes.  Differential would include reactive adenopathy versus lymphoproliferative process. - Recommended repeat CT chest, abdomen pelvis with contrast in 1 month.  NSVT Vs A. fib with aberrancy -Noted 28 beat wide complex nonsustained tachycardia on 6/2 at 3:07 AM. -Continue monitoring on telemetry. -Replace potassium to >4.  Magnesium 2.1. -Starting carvedilol 3.125 mg twice daily -No recurrence noted.  Hypokalemia -Replaced.  Magnesium 2.1.   DVT prophylaxis: Currently on IV heparin drip bridging and Coumadin. Code Status: Full Family Communication: Patient declined this MDs offer to call his spouse to update care.  He states that cardiology is already speaking with his wife. Disposition: To be determined pending further evaluation and management by cardiology.  Possible DC home on  6/8.   Consultants:  Neurology Vascular Surgery Cardiology- CHF team Cardiothoracic surgery.  Procedures:  Carotid doppler Echocardiogram RUE PICC line  Cardiac cath 6/4: Assessment: 1. Severe 3v CAD in left dominant system with 70-80% LM lesion and 99% proximal LAD lesion 2. Ischemic CM EF 25% 3. Mild to moderate pulmonary HTN 4. Moderately reduced CO  Antimicrobials:  None   Subjective: Patient denies complaints.  No dyspnea or chest pain.  ROS: As above, otherwise negative.  Objective:  Vitals:   07/31/18 0005 07/31/18 0400 07/31/18 0804 07/31/18 1108  BP: 119/82 131/88 112/69 128/78  Pulse: 86 82  90  Resp: 19 (!) 21    Temp: 98 F (36.7 C) (!) 97.5 F (36.4 C) 97.6 F (36.4 C) (!) 97.5 F (36.4 C)  TempSrc: Oral Oral Oral Oral  SpO2: 98% 95% 97% 97%  Weight:      Height:        Examination: No significant change in clinical exam compared to yesterday.  General exam: Pleasant elderly male, moderately built and nourished seen ambulating comfortably in the room. Respiratory system: Clear to auscultation.  No increased work of breathing.  Stable. Cardiovascular system: S1 & S2 heard, irregularly irregular. No JVD, murmurs, rubs, gallops or clicks. No pedal edema.  Telemetry personally reviewed: A. fib with controlled ventricular rate. Gastrointestinal system: Abdomen is nondistended, soft and nontender. No organomegaly or masses felt. Normal bowel sounds heard.  Stable. Central nervous system: Alert and oriented. No focal neurological deficits. Extremities: Symmetric 5 x 5 power. Skin: No rashes, lesions or ulcers Psychiatry: Judgement and insight appear normal. Mood & affect appropriate.     Data Reviewed: I have personally reviewed following labs and imaging studies  CBC: Recent Labs  Lab 07/24/18 1508 07/26/18 1008 07/27/18 0021 07/28/18 1610 07/29/18 1637  07/29/18 1641 07/29/18 1642 07/30/18 0553 07/31/18 0332  WBC 8.5 11.0* 10.9* 10.9*   --   --   --  9.7 9.3  NEUTROABS 6.3  --   --   --   --   --   --   --   --   HGB 12.3* 13.3 13.7 14.1 15.3 16.0 16.0 13.9 13.9  HCT 39.4 41.7 41.9 43.9 45.0 47.0 47.0 43.3 43.7  MCV 86.6 83.1 82.5 82.8  --   --   --  83.8 83.7  PLT 386 452* 475* 443*  --   --   --  454* 503*   Basic Metabolic Panel: Recent Labs  Lab 07/27/18 0021 07/28/18 0333 07/29/18 0451 07/29/18 1637 07/29/18 1641 07/29/18 1642 07/30/18 0553 07/31/18 0332  NA 133* 138 137 140 139 139 137 140  K 3.4* 3.0* 3.9 3.5 3.6 3.7 3.8 3.8  CL 93* 92* 97*  --   --   --  99 104  CO2 25 31 30   --   --   --  27 26  GLUCOSE 213* 167* 161*  --   --   --  152* 124*  BUN 32* 29* 26*  --   --   --  23 18  CREATININE 1.37* 1.16 1.17  --   --   --  1.08 0.96  CALCIUM 8.7* 8.7* 8.9  --   --   --  9.2 9.0  MG  --  2.1  --   --   --   --   --   --    Liver Function Tests: Recent Labs  Lab 07/24/18 1508  AST 30  ALT 20  ALKPHOS 33*  BILITOT 0.8  PROT 6.7  ALBUMIN 3.8   Coagulation Profile: Recent Labs  Lab 07/27/18 0021 07/28/18 0333 07/29/18 0451 07/30/18 0553 07/31/18 0332  INR 2.7* 2.2* 1.8* 1.6* 1.4*   Cardiac Enzymes: Recent Labs  Lab 07/26/18 1008 07/26/18 1229 07/26/18 1706 07/27/18 0021  TROPONINI 0.08* 0.23* 0.36* 0.35*   HbA1C: No results for input(s): HGBA1C in the last 72 hours. CBG: Recent Labs  Lab 07/30/18 1240 07/30/18 1600 07/30/18 2106 07/31/18 0628 07/31/18 1126  GLUCAP 250* 275* 126* 143* 219*    Recent Results (from the past 240 hour(s))  SARS Coronavirus 2 (CEPHEID - Performed in [place] hospital lab), Hosp Order     Status: None   Collection Time: 07/24/18  4:48 PM  Result Value Ref Range Status   SARS Coronavirus 2 NEGATIVE NEGATIVE Final    Comment: (NOTE) If result is NEGATIVE SARS-CoV-2 target nucleic acids are NOT DETECTED. The SARS-CoV-2 RNA is generally detectable in upper and lower  respiratory specimens during the acute phase of infection. The lowest   concentration of SARS-CoV-2 viral copies this assay can detect is 250  copies / mL. A negative result does not preclude SARS-CoV-2 infection  and should not be used as the sole basis for treatment or other  patient management decisions.  A negative result may occur with  improper specimen collection / handling, submission of specimen other  than nasopharyngeal swab, presence of viral mutation(s) within the  areas targeted by this assay, and inadequate number of viral copies  (<250 copies / mL). A negative result must be combined with clinical  observations, patient history, and epidemiological information. If result is POSITIVE SARS-CoV-2 target nucleic acids are DETECTED. The SARS-CoV-2 RNA is generally detectable in upper and lower  respiratory specimens dur  ing the acute phase of infection.  Positive  results are indicative of active infection with SARS-CoV-2.  Clinical  correlation with patient history and other diagnostic information is  necessary to determine patient infection status.  Positive results do  not rule out bacterial infection or co-infection with other viruses. If result is PRESUMPTIVE POSTIVE SARS-CoV-2 nucleic acids MAY BE PRESENT.   A presumptive positive result was obtained on the submitted specimen  and confirmed on repeat testing.  While 2019 novel coronavirus  (SARS-CoV-2) nucleic acids may be present in the submitted sample  additional confirmatory testing may be necessary for epidemiological  and / or clinical management purposes  to differentiate between  SARS-CoV-2 and other Sarbecovirus currently known to infect humans.  If clinically indicated additional testing with an alternate test  methodology [phone]) is advised. The SARS-CoV-2 RNA is generally  detectable in upper and lower respiratory sp ecimens during the acute  phase of infection. The expected result is Negative. Fact Sheet for Patients:  StrictlyIdeas.no Fact Sheet  for Healthcare Providers: BankingDealers.co.za This test is not yet approved or cleared by the [place] FDA and has been authorized for detection and/or diagnosis of SARS-CoV-2 by FDA under an Emergency Use Authorization (EUA).  This EUA will remain in effect (meaning this test can be used) for the duration of the COVID-19 declaration under Section 564(b)(1) of the Act, 21 U.S.C. section 360bbb-3(b)(1), unless the authorization is terminated or revoked sooner. Performed at [facility], [address], [place], [place] [address]          Radiology Studies: Mr Cardiac Morphology W Wo Contrast  Result Date: 07/30/2018 CLINICAL DATA:  Ischemic cardiomyopathy EXAM: CARDIAC MRI TECHNIQUE: The patient was scanned on a 1.5 Tesla GE magnet. A dedicated cardiac coil was used. Functional imaging was done using Fiesta sequences. 2,3, and 4 chamber views were done to assess for RWMA's. Modified Simpson's rule using a short axis stack was used to calculate an ejection fraction on a dedicated work Conservation officer, nature. The patient received 8 cc of Multihance. After 10 minutes inversion recovery sequences were used to assess for infiltration and scar tissue. CONTRAST:  8 cc Gadavist FINDINGS: Small to moderate right pleural effusion, small left pleural effusion. Small pericardial effusion. The left ventricle is mildly dilated with normal wall thickness, EF 23%. Akinesis of the mid to apical anterior/anteroseptal/inferoseptal walls, the apical inferior and lateral walls, and the true apex. No LV apical thrombus noted. Mildly dilated right ventricle, EF 32%. Severe left atrial enlargement. Moderate right atrial enlargement. Mild-moderate mitral regurgitation. Trileaflet aortic valve with trivial regurgitation, no stenosis. Mild-moderate tricuspid regurgitation. Delayed enhancement images: 76-99% wall thickness late gadolinium enhancement (LGE) in the mid anteroseptal and mid  inferoseptal wall segments. 76-99% wall thickness LGE at the apical cap and the apical inferior wall segment. Measurements: LVEDV 246 mL LVSV 57 mL LVEF 23% RVEDV 203 mL RVSV 65 mL RVEF 32% IMPRESSION: 1. Mildly dilated LV with EF 23%, wall motion abnormalities as noted above. 2. Mildly dilated RV with EF 32%, moderately depressed systolic function. 3. Delayed enhancement presents a mixed picture. There are extensive wall motion abnormalities. The delayed enhancement images suggest that the mid anteroseptal/inferoseptal and the apical inferior and true apex wall segments would be unlikely to improve with revascularization. However, the other affected wall segments appear viable. [name] Electronically Signed   By: Loralie Champagne M.D.   On: 07/30/2018 16:34        Scheduled Meds: . aspirin  EC  81 mg Oral Daily  . carvedilol  3.125 mg Oral BID WC  . digoxin  0.125 mg Oral Daily  . ezetimibe  10 mg Oral Daily  . fenofibrate  160 mg Oral Daily  . insulin aspart  0-15 Units Subcutaneous TID WC  . insulin aspart  0-5 Units Subcutaneous QHS  . insulin aspart  4 Units Subcutaneous TID WC  . multivitamin with minerals  1 tablet Oral Daily  . omega-3 acid ethyl esters  2 g Oral BID  . pravastatin  80 mg Oral Daily  . sacubitril-valsartan  1 tablet Oral BID  . sodium chloride flush  10-40 mL Intracatheter Q12H  . sodium chloride flush  3 mL Intravenous Q12H  . sodium chloride flush  3 mL Intravenous Q12H  . [START ON 08/01/2018] spironolactone  25 mg Oral Daily  . warfarin  10 mg Oral ONCE-1800  . Warfarin - Pharmacist Dosing Inpatient   Does not apply q1800   Continuous Infusions: . sodium chloride    . heparin 1,300 Units/hr (07/31/18 0857)     LOS: 6 days     Vernell Leep, MD, FACP, [facility]. [facility]  To contact the attending provider between 7A-7P or the covering provider during after hours 7P-7A, please log into the web site [email or web address] and access using universal [facility] password for that web site. If you do not have the password, please call the hospital operator.  07/31/2018, 1:53 PM

## 2018-07-31 NOTE — Progress Notes (Addendum)
ANTICOAGULATION CONSULT NOTE  Pharmacy Consult:  Heparin Indication: atrial fibrillation, MV CAD  Allergies  Allergen Reactions  . Penicillins Hives    Blisters on hands  . Statins     Joint and muscle pain and low energy.    Patient Measurements: Height: 5\' 6"  (167.6 cm) Weight: 153 lb 3.2 oz (69.5 kg) IBW/kg (Calculated) : 63.8  HEPARIN DW (KG): 77  Vital Signs: Temp: 97.6 F (36.4 C) (06/06 0804) Temp Source: Oral (06/06 0804) BP: 112/69 (06/06 0804) Pulse Rate: 82 (06/06 0400)  Labs: Recent Labs    07/29/18 0451  07/29/18 1642 07/30/18 0553 07/30/18 0922 07/30/18 1820 07/31/18 0332  HGB  --    < > 16.0 13.9  --   --  13.9  HCT  --    < > 47.0 43.3  --   --  43.7  PLT  --   --   --  454*  --   --  431*  LABPROT 20.5*  --   --  19.0*  --   --  17.1*  INR 1.8*  --   --  1.6*  --   --  1.4*  HEPARINUNFRC  --   --   --   --  0.12* 0.34 0.41  CREATININE 1.17  --   --  1.08  --   --  0.96   < > = values in this interval not displayed.    Estimated Creatinine Clearance: 56.3 mL/min (by C-G formula based on SCr of 0.96 mg/dL).   Assessment: 43 YOM here with AMS and TIA. He is on warfarin PTA for afib and pharmacy was consulted to dose.  Patient continues on heparin bridge while undergoing cardiac surgery workup, currently refusing CABG.   Heparin level is therapeutic; no bleeding reported. INR down to 1.4  Goal of Therapy:  Heparin level 0.3-0.7 units/mL Monitor platelets by anticoagulation protocol: Yes   Plan:  Continue heparin gtt at 1300 units/hr F/U AM labs  Erin Hearing PharmD., BCPS Clinical Pharmacist 07/31/2018 8:42 AM  Addendum: Oscar [name] warfarin - give 10mg  tonight.

## 2018-07-31 NOTE — Progress Notes (Signed)
Patient ID: Oscar [name], male   DOB: 11/02/1940, 78 y.o.   MRN: [id number]   Advanced Heart Failure Rounding Note   Subjective:    Cath 6/5 showed 99% prox LAD lesion and moderate LM lesion (felt to be non-critical) . EF 25% RHC ok.    RA = 7 RV = 59/6 PA = 59/24 (37) PCW = 22 Fick cardiac output/index = 4.1/2.3 PVR = 3.3 WU Ao sat = 93% PA sat = 62%, 64%  Cardiac MRI IMPRESSION: 1. Mildly dilated LV with EF 23%, wall motion abnormalities as noted above. 2. Mildly dilated RV with EF 32%, moderately depressed systolic function. 3. Delayed enhancement presents a mixed picture. There are extensive wall motion abnormalities. The delayed enhancement images suggest that the mid anteroseptal/inferoseptal and the apical inferior and true apex wall segments would be unlikely to improve with revascularization. However, the other affected wall segments appear viable.  Denies CP or SOB.  He does not want CABG.  BP stable on current meds, CVP 5 this morning.   Objective:   Weight Range:  Vital Signs:   Temp:  [97.5 F (36.4 C)-98 F (36.7 C)] 97.6 F (36.4 C) (06/06 0804) Pulse Rate:  [82-108] 82 (06/06 0400) Resp:  [19-21] 21 (06/06 0400) BP: (112-151)/(69-88) 112/69 (06/06 0804) SpO2:  [95 %-100 %] 95 % (06/06 0400) Last BM Date: 07/30/18  Weight change: Filed Weights   07/24/18 2110 07/27/18 0500 07/28/18 0649  Weight: 77 kg 73.3 kg 69.5 kg    Intake/Output:   Intake/Output Summary (Last 24 hours) at 07/31/2018 1013 Last data filed at 07/31/2018 0805 Gross per 24 hour  Intake 896.29 ml  Output 950 ml  Net -53.71 ml     Physical Exam: General: NAD Neck: No JVD, no thyromegaly or thyroid nodule.  Lungs: Clear to auscultation bilaterally with normal respiratory effort. CV: Nondisplaced PMI.  Heart regular S1/S2, no S3/S4, no murmur.  No peripheral edema.   Abdomen: Soft, nontender, no hepatosplenomegaly, no distention.  Skin: Intact without lesions or rashes.   Neurologic: Alert and oriented x 3.  Psych: Normal affect. Extremities: No clubbing or cyanosis.  HEENT: Normal.    Telemetry: AF 80s Personally reviewed   Labs: Basic Metabolic Panel: Recent Labs  Lab 07/27/18 0021 07/28/18 0333 07/29/18 0451 07/29/18 1637 07/29/18 1641 07/29/18 1642 07/30/18 0553 07/31/18 0332  NA 133* 138 137 140 139 139 137 140  K 3.4* 3.0* 3.9 3.5 3.6 3.7 3.8 3.8  CL 93* 92* 97*  --   --   --  99 104  CO2 25 31 30   --   --   --  27 26  GLUCOSE 213* 167* 161*  --   --   --  152* 124*  BUN 32* 29* 26*  --   --   --  23 18  CREATININE 1.37* 1.16 1.17  --   --   --  1.08 0.96  CALCIUM 8.7* 8.7* 8.9  --   --   --  9.2 9.0  MG  --  2.1  --   --   --   --   --   --     Liver Function Tests: Recent Labs  Lab 07/24/18 1508  AST 30  ALT 20  ALKPHOS 33*  BILITOT 0.8  PROT 6.7  ALBUMIN 3.8   No results for input(s): LIPASE, AMYLASE in the last 168 hours. No results for input(s): AMMONIA in the last 168 hours.  CBC: Recent  Labs  Lab 07/24/18 1508 07/26/18 1008 07/27/18 0021 07/28/18 0333 07/29/18 1637 07/29/18 1641 07/29/18 1642 07/30/18 0553 07/31/18 0332  WBC 8.5 11.0* 10.9* 10.9*  --   --   --  9.7 9.3  NEUTROABS 6.3  --   --   --   --   --   --   --   --   HGB 12.3* 13.3 13.7 14.1 15.3 16.0 16.0 13.9 13.9  HCT 39.4 41.7 41.9 43.9 45.0 47.0 47.0 43.3 43.7  MCV 86.6 83.1 82.5 82.8  --   --   --  83.8 83.7  PLT 386 452* 475* 443*  --   --   --  454* 431*    Cardiac Enzymes: Recent Labs  Lab 07/26/18 1008 07/26/18 1229 07/26/18 1706 07/27/18 0021  TROPONINI 0.08* 0.23* 0.36* 0.35*    BNP: BNP (last 3 results) Recent Labs    07/21/18 1754  BNP 2,433.0*    ProBNP (last 3 results) No results for input(s): PROBNP in the last 8760 hours.    Other results:  Imaging: Mr Cardiac Morphology W Wo Contrast  Result Date: 07/30/2018 CLINICAL DATA:  Ischemic cardiomyopathy EXAM: CARDIAC MRI TECHNIQUE: The patient was scanned on  a 1.5 Tesla GE magnet. A dedicated cardiac coil was used. Functional imaging was done using Fiesta sequences. 2,3, and 4 chamber views were done to assess for RWMA's. Modified Simpson's rule using a short axis stack was used to calculate an ejection fraction on a dedicated work Conservation officer, nature. The patient received 8 cc of Multihance. After 10 minutes inversion recovery sequences were used to assess for infiltration and scar tissue. CONTRAST:  8 cc Gadavist FINDINGS: Small to moderate right pleural effusion, small left pleural effusion. Small pericardial effusion. The left ventricle is mildly dilated with normal wall thickness, EF 23%. Akinesis of the mid to apical anterior/anteroseptal/inferoseptal walls, the apical inferior and lateral walls, and the true apex. No LV apical thrombus noted. Mildly dilated right ventricle, EF 32%. Severe left atrial enlargement. Moderate right atrial enlargement. Mild-moderate mitral regurgitation. Trileaflet aortic valve with trivial regurgitation, no stenosis. Mild-moderate tricuspid regurgitation. Delayed enhancement images: 76-99% wall thickness late gadolinium enhancement (LGE) in the mid anteroseptal and mid inferoseptal wall segments. 76-99% wall thickness LGE at the apical cap and the apical inferior wall segment. Measurements: LVEDV 246 mL LVSV 57 mL LVEF 23% RVEDV 203 mL RVSV 65 mL RVEF 32% IMPRESSION: 1. Mildly dilated LV with EF 23%, wall motion abnormalities as noted above. 2. Mildly dilated RV with EF 32%, moderately depressed systolic function. 3. Delayed enhancement presents a mixed picture. There are extensive wall motion abnormalities. The delayed enhancement images suggest that the mid anteroseptal/inferoseptal and the apical inferior and true apex wall segments would be unlikely to improve with revascularization. However, the other affected wall segments appear viable. [name] Electronically Signed   By: Loralie Champagne M.D.   On: 07/30/2018  16:34     Medications:     Scheduled Medications: . aspirin EC  81 mg Oral Daily  . digoxin  0.125 mg Oral Daily  . ezetimibe  10 mg Oral Daily  . fenofibrate  160 mg Oral Daily  . insulin aspart  0-15 Units Subcutaneous TID WC  . insulin aspart  0-5 Units Subcutaneous QHS  . insulin aspart  4 Units Subcutaneous TID WC  . multivitamin with minerals  1 tablet Oral Daily  . omega-3 acid ethyl esters  2 g Oral BID  .  pravastatin  80 mg Oral Daily  . sacubitril-valsartan  1 tablet Oral BID  . sodium chloride flush  10-40 mL Intracatheter Q12H  . sodium chloride flush  3 mL Intravenous Q12H  . sodium chloride flush  3 mL Intravenous Q12H  . [START ON 08/01/2018] spironolactone  25 mg Oral Daily    Infusions: . sodium chloride    . heparin 1,300 Units/hr (07/31/18 0857)    PRN Medications: sodium chloride, acetaminophen **OR** acetaminophen (TYLENOL) oral liquid 160 mg/5 mL **OR** acetaminophen, albuterol, calcium carbonate, hydrALAZINE, ipratropium-albuterol, ondansetron (ZOFRAN) IV, senna-docusate, sodium chloride flush, sodium chloride flush   Assessment/Plan:   1. Acute systolic HF -> cardiogenic shock  - Echo this admit EF 20-25% with RWMA - Echo 7/19 EF 65% - Cath on 6/5: Left dominant system  LM 50-60% LAD 99% pLCx 60% RCA non-dominant occluded - Patient refusing CABG eval. Dr. Haroldine Laws reviewed films with Dr. Burt Knack and feels LAD is more like a CTO. cMRI ordered to look for viability => This was a mixed picture showing areas that may improve with revascularization and areas that likely would not improve with revascularization in the LAD distribution. Could consider high-risk PCI with atherectomy and Impella support.  I discussed PCI with patient today.  Unless there would be significant recovery with minimal risk, he is really not interested.  He wants to pursue medical management for now.  - Volume status improved. CVP 5 today. Will likely start po Lasix tomorrow.  - Co-ox  adequate at 78%, will add low dose Coreg.   - Continue digoxin, spiro, Entresto 24/26.   2. CAD - plan as above - continue ASA, statin.  - Once warfarin back on board can stop ASA  3. Acute hypoxic respiratory failure - Improved with diuresis  4. Chronic AF - rate ok - on heparin with INR < 2.0, restarting warfarin today.   5. TIA with left carotid stenosis - VVS following for CEA when optimized  6. DM2 - per primary team   Will plan on home probably Monday.   Length of Stay: 6   Loralie Champagne MD 07/31/2018, 10:13 AM  Advanced Heart Failure Team Pager [phone] (M-F; 7a - 4p)  Please contact [facility] for night-coverage after hours (4p -7a ) and weekends on [email or web address]

## 2018-08-01 DIAGNOSIS — G459 Transient cerebral ischemic attack, unspecified: Principal | ICD-10-CM

## 2018-08-01 DIAGNOSIS — I5043 Acute on chronic combined systolic (congestive) and diastolic (congestive) heart failure: Secondary | ICD-10-CM

## 2018-08-01 LAB — CBC
HCT: 41.7 % (ref 39.0–52.0)
Hemoglobin: 13.3 g/dL (ref 13.0–17.0)
MCH: 26.7 pg (ref 26.0–34.0)
MCHC: 31.9 g/dL (ref 30.0–36.0)
MCV: 83.7 fL (ref 80.0–100.0)
Platelets: 389 10*3/uL (ref 150–400)
RBC: 4.98 MIL/uL (ref 4.22–5.81)
RDW: 17 % — ABNORMAL HIGH (ref 11.5–15.5)
WBC: 8.2 10*3/uL (ref 4.0–10.5)
nRBC: 0 % (ref 0.0–0.2)

## 2018-08-01 LAB — GLUCOSE, CAPILLARY
Glucose-Capillary: 149 mg/dL — ABNORMAL HIGH (ref 70–99)
Glucose-Capillary: 285 mg/dL — ABNORMAL HIGH (ref 70–99)
Glucose-Capillary: 219 mg/dL — ABNORMAL HIGH (ref 70–99)
Glucose-Capillary: 120 mg/dL — ABNORMAL HIGH (ref 70–99)

## 2018-08-01 LAB — BASIC METABOLIC PANEL
Anion gap: 10 (ref 5–15)
BUN: 19 mg/dL (ref 8–23)
CO2: 26 mmol/L (ref 22–32)
Calcium: 9.2 mg/dL (ref 8.9–10.3)
Chloride: 103 mmol/L (ref 98–111)
Creatinine, Ser: 0.91 mg/dL (ref 0.61–1.24)
GFR calc Af Amer: >60 mL/min (ref >60)
GFR calc non Af Amer: >60 mL/min (ref >60)
Glucose, Bld: 164 mg/dL — ABNORMAL HIGH (ref 70–99)
Potassium: 4.2 mmol/L (ref 3.5–5.1)
Sodium: 139 mmol/L (ref 135–145)

## 2018-08-01 LAB — COOXEMETRY PANEL
Carboxyhemoglobin: 1.6 % — ABNORMAL HIGH (ref 0.5–1.5)
Methemoglobin: 1.3 % (ref 0.0–1.5)
O2 Saturation: 59.9 %
Total hemoglobin: 13.9 g/dL (ref 12.0–16.0)

## 2018-08-01 LAB — HEPARIN LEVEL (UNFRACTIONATED): Heparin Unfractionated: 0.56 IU/mL (ref 0.30–0.70)

## 2018-08-01 LAB — PROTIME-INR
INR: 1.3 — ABNORMAL HIGH (ref 0.8–1.2)
Prothrombin Time: 16.1 seconds — ABNORMAL HIGH (ref 11.4–15.2)

## 2018-08-01 MED ORDER — WARFARIN SODIUM 10 MG PO TABS: 10.0000 mg | ORAL_TABLET | Freq: Once | ORAL | Status: DC

## 2018-08-01 MED ORDER — FUROSEMIDE 40 MG PO TABS
40.0000 mg | ORAL_TABLET | Freq: Every day | ORAL | Status: DC
  Administered 2018-08-01 – 2018-08-02 (×2): 40 mg via ORAL
  Filled 2018-08-01 (×2): qty 1

## 2018-08-01 NOTE — Progress Notes (Signed)
This patient receives his medications through [place] mail system. He has questions regarding new RX on d/c

## 2018-08-01 NOTE — Progress Notes (Signed)
PROGRESS NOTE   Oscar [name]  DGL:[id number]    DOB: 1940/09/23    DOA: 07/24/2018  PCP: No primary care provider on file.   I have briefly reviewed patients previous medical records in [facility]  Brief Narrative:  78 year old married male, PMH of permanent A. fib on warfarin, COPD on home oxygen 4 L/min at bedtime, chronic systolic CHF with subsequent normalization of EF, nonobstructive CAD, DM 2, HTN, HLD, carotid artery disease status post left CEA in 2007, presented to ED for evaluation of transient episode of AMS, slurred speech and balance difficulty.  He was admitted for suspected TIA and found to have occluded right ICA.  Hospital course complicated by acute new systolic CHF with EF 51-88% and acute respiratory failure with hypoxia.  Cardiology consulted.  Aggressively diuresed when clinically euvolemic.  Underwent cardiac cath that showed severe three-vessel CAD.  Patient declines evaluation for CABG or PCI.  Optimizing medical regimen and possible DC home 6/8.   Assessment & Plan:   Active Problems:   ATRIAL FIBRILLATION   History of left-sided carotid endarterectomy   Carotid artery stenosis   TIA (transient ischemic attack)   Acute on chronic respiratory failure with hypoxia -On home oxygen 4 L/min at bedtime. -Acute respiratory failure likely related to acute CHF -Acute respiratory failure resolved.  New acute sytolic heart failure/cardiomyopathy/cardiogenic shock -Echocardiogram showed EF 41-66%, LV diastolic parameters indeterminate.  Only base of left ventricle moves well.  RV moderately reduced systolic function. -LVEF 65% on 7/19. -Previous cath with nonobstructive CAD. -Cardiology consultation and follow-up appreciated. -Treated with IV furosemide 80 mg twice daily.  IV Lasix stopped 6/3.  Cardiology plans to initiate oral Lasix 6/7. - Diuresed well with IV Lasix and clinically euvolemic. - Continue digoxin, spironolactone and Entresto.  Adding low-dose  carvedilol. - Repeat cardiac cath 6/4 shows severe three-vessel CAD.  Seen by thoracic surgery but patient declined surgery.  Cardiology has discussed risks and benefits of high risk PCI with patient and he has declined that as well.  Also thoracic surgery opined that due to patient's severe ischemic cardiomyopathy with significant scarring, he would not benefit from CABG. -Started back on Lasix 40 mg daily.  Stable.  Chest tightness/elevated troponin/severe three-vessel CAD -Suspect due to decompensated CHF. -Troponin peaked to 0.36 but plateaued.  Elevated troponin likely due to demand ischemia from A. fib with RVR and acute CHF. -Previous cath with nonobstructive CAD and plan for repeat cath this afternoon. - Repeat cardiac cath 6/4 shows severe three-vessel CAD.  Patient has currently declined CABG or high risk PCI.  Medical management as noted above. -As per cardiology follow-up, patient is stable symptomatically and hemodynamically and no further cardiac interventions planned.  TIA -Patient presented with a brief episode of altered mental status, slurred speech, unsteady gait -CT head with no acute findings -CTA head and neck with significant right carotid artery narrowing -MRI showed no acute or subacute finding -Carotid Doppler:Bilateral ICA 1 to 39% stenosis bilateral vertebral arteriesdemonstrate antegrade flow -History of atrial fibrillation on anticoagulation with Coumadin -Echocardiogram results as above. -LDL55, Hemoglobin A1c6.9 -Neurology consulted and appreciated, recommended vascular consultation  -Continue aspirin, statin. INR 1.3 and IV heparin initiated per pharmacy. -PT assessed patient and recommended no further therapy  Severe right carotid artery stenosis -CTA head and neck showed significant stenosis R ICA -History of left carotid endarterectomy -Vascular surgery follow-up from 6/5 appreciated.  Plans for right carotid endarterectomy when cardiac and  pulmonary status optimized. -Continue aspirin and  statins.  -Await vascular surgery follow-up 6/8 regarding right CEA, Coumadin held until their follow-up.  Cardiology appears to have cleared patient for procedure.  Essential hypertension -Controlled.  Lisinopril held.  Carvedilol 3.125 mg twice daily initiated on 6/6. -Currently on Entresto, Aldactone.  IV Lasix stopped 6/3.  Oral Lasix 40 mg daily started.  Hyperlipidemia -Takes Zetia, fenofibrate, pravastatin -lipid panel: total cholesterol 95, HDL 17, LDL 55, TG 113 -patient has not been on welchol in months  Diabetes mellitus, type II -CBGs mildly uncontrolled and fluctuating. -A1c 6.9.   -Continue SSI moderate sensitivity along with bedtime scale.   -DM coordinator input appreciated.  Added NovoLog 4 units mealtime. -on glipizide, tradjenta, metformin at home, currently on hold.  Resume home medications at discharge.  Chronic atrial fibrillation, now with RVR -Suspect RVR secondary to respiratory status -CHADSVASC 8 -Coreg held to left permissive hypertension -Continue digoxin -INR 1.6 -Coumadin held, started IV heparin for INR <2.  Remains on IV heparin bridging and Coumadin initiated 6/6 but Coumadin again held today until vascular surgery input tomorrow. -Controlled ventricular rate. -As per cardiology, if INR subtherapeutic at time of discharge, due to recent TIA he could be bridged with Lovenox and Coumadin at discharge.  Upper thoracic adenopathy -found on CTA head/neck -CT chest: Multiple enlarged medium sized mediastinal lymph nodes.  Differential would include reactive adenopathy versus lymphoproliferative process. - Recommended repeat CT chest, abdomen pelvis with contrast in 1 month.  NSVT Vs A. fib with aberrancy -Noted 28 beat wide complex nonsustained tachycardia on 6/2 at 3:07 AM. -Continue monitoring on telemetry. -Replace potassium to >4.  Magnesium 2.1. -Starting carvedilol 3.125 mg twice daily -No  recurrence noted.  Hypokalemia -Replaced.  Magnesium 2.1.   DVT prophylaxis: Currently on IV heparin drip bridging and Coumadin. Code Status: Full Family Communication: Patient declined this MDs offer to call his spouse to update care.  He states that cardiology is already speaking with his wife. Disposition: To be determined pending vascular surgery input 6/8 and if no intervention planned, then possible discharge home 6/8 with Lovenox bridge.   Consultants:  Neurology Vascular Surgery Cardiology- CHF team Cardiothoracic surgery.  Procedures:  Carotid doppler Echocardiogram RUE PICC line  Cardiac cath 6/4: Assessment: 1. Severe 3v CAD in left dominant system with 70-80% LM lesion and 99% proximal LAD lesion 2. Ischemic CM EF 25% 3. Mild to moderate pulmonary HTN 4. Moderately reduced CO  Antimicrobials:  None   Subjective: Seen this morning prior to Cardiology visit.  Patient denied complaints.  ROS: As above, otherwise negative.  Objective:  Vitals:   08/01/18 0331 08/01/18 0400 08/01/18 0649 08/01/18 0819  BP: 116/62  (!) 122/91 (!) 153/129  Pulse: 78  81 81  Resp: (!) 24 (!) 21    Temp: 97.7 F (36.5 C)   97.8 F (36.6 C)  TempSrc: Oral   Oral  SpO2: 97%   96%  Weight:      Height:        Examination:   General exam: Pleasant elderly male, moderately built and nourished seen standing in front of the sink brushing his teeth. Respiratory system: Clear to auscultation.  No increased work of breathing.  Stable Cardiovascular system: S1 & S2 heard, irregularly irregular. No JVD, murmurs, rubs, gallops or clicks. No pedal edema.  Telemetry personally reviewed: A. fib with controlled ventricular rate.  Stable Gastrointestinal system: Abdomen is nondistended, soft and nontender. No organomegaly or masses felt. Normal bowel sounds heard.  Stable. Central nervous system:  Alert and oriented. No focal neurological deficits. Extremities: Symmetric 5 x 5  power. Skin: No rashes, lesions or ulcers Psychiatry: Judgement and insight appear normal. Mood & affect appropriate.     Data Reviewed: I have personally reviewed following labs and imaging studies  CBC: Recent Labs  Lab 07/27/18 0021 07/28/18 0333  07/29/18 1641 07/29/18 1642 07/30/18 0553 07/31/18 0332 08/01/18 0517  WBC 10.9* 10.9*  --   --   --  9.7 9.3 8.2  HGB 13.7 14.1   < > 16.0 16.0 13.9 13.9 13.3  HCT 41.9 43.9   < > 47.0 47.0 43.3 43.7 41.7  MCV 82.5 82.8  --   --   --  83.8 83.7 83.7  PLT 475* 443*  --   --   --  454* 431* 389   < > = values in this interval not displayed.   Basic Metabolic Panel: Recent Labs  Lab 07/28/18 0333 07/29/18 0451  07/29/18 1641 07/29/18 1642 07/30/18 0553 07/31/18 0332 08/01/18 0517  NA 138 137   < > 139 139 137 140 139  K 3.0* 3.9   < > 3.6 3.7 3.8 3.8 4.2  CL 92* 97*  --   --   --  99 104 103  CO2 31 30  --   --   --  27 26 26   GLUCOSE 167* 161*  --   --   --  152* 124* 164*  BUN 29* 26*  --   --   --  23 18 19   CREATININE 1.16 1.17  --   --   --  1.08 0.96 0.91  CALCIUM 8.7* 8.9  --   --   --  9.2 9.0 9.2  MG 2.1  --   --   --   --   --   --   --    < > = values in this interval not displayed.   Liver Function Tests: No results for input(s): AST, ALT, ALKPHOS, BILITOT, PROT, ALBUMIN in the last 168 hours. Coagulation Profile: Recent Labs  Lab 07/28/18 0333 07/29/18 0451 07/30/18 0553 07/31/18 0332 08/01/18 0517  INR 2.2* 1.8* 1.6* 1.4* 1.3*   Cardiac Enzymes: Recent Labs  Lab 07/26/18 1008 07/26/18 1229 07/26/18 1706 07/27/18 0021  TROPONINI 0.08* 0.23* 0.36* 0.35*   HbA1C: No results for input(s): HGBA1C in the last 72 hours. CBG: Recent Labs  Lab 07/31/18 0628 07/31/18 1126 07/31/18 1626 07/31/18 2136 08/01/18 0518  GLUCAP 143* 219* 235* 148* 149*    Recent Results (from the past 240 hour(s))  SARS Coronavirus 2 (CEPHEID - Performed in [place] hospital lab), Hosp Order     Status: None    Collection Time: 07/24/18  4:48 PM  Result Value Ref Range Status   SARS Coronavirus 2 NEGATIVE NEGATIVE Final    Comment: (NOTE) If result is NEGATIVE SARS-CoV-2 target nucleic acids are NOT DETECTED. The SARS-CoV-2 RNA is generally detectable in upper and lower  respiratory specimens during the acute phase of infection. The lowest  concentration of SARS-CoV-2 viral copies this assay can detect is 250  copies / mL. A negative result does not preclude SARS-CoV-2 infection  and should not be used as the sole basis for treatment or other  patient management decisions.  A negative result may occur with  improper specimen collection / handling, submission of specimen other  than nasopharyngeal swab, presence of viral mutation(s) within the  areas targeted by this assay, and inadequate number  of viral copies  (<250 copies / mL). A negative result must be combined with clinical  observations, patient history, and epidemiological information. If result is POSITIVE SARS-CoV-2 target nucleic acids are DETECTED. The SARS-CoV-2 RNA is generally detectable in upper and lower  respiratory specimens dur ing the acute phase of infection.  Positive  results are indicative of active infection with SARS-CoV-2.  Clinical  correlation with patient history and other diagnostic information is  necessary to determine patient infection status.  Positive results do  not rule out bacterial infection or co-infection with other viruses. If result is PRESUMPTIVE POSTIVE SARS-CoV-2 nucleic acids MAY BE PRESENT.   A presumptive positive result was obtained on the submitted specimen  and confirmed on repeat testing.  While 2019 novel coronavirus  (SARS-CoV-2) nucleic acids may be present in the submitted sample  additional confirmatory testing may be necessary for epidemiological  and / or clinical management purposes  to differentiate between  SARS-CoV-2 and other Sarbecovirus currently known to infect humans.   If clinically indicated additional testing with an alternate test  methodology [phone]) is advised. The SARS-CoV-2 RNA is generally  detectable in upper and lower respiratory sp ecimens during the acute  phase of infection. The expected result is Negative. Fact Sheet for Patients:  StrictlyIdeas.no Fact Sheet for Healthcare Providers: BankingDealers.co.za This test is not yet approved or cleared by the [place] FDA and has been authorized for detection and/or diagnosis of SARS-CoV-2 by FDA under an Emergency Use Authorization (EUA).  This EUA will remain in effect (meaning this test can be used) for the duration of the COVID-19 declaration under Section 564(b)(1) of the Act, 21 U.S.C. section 360bbb-3(b)(1), unless the authorization is terminated or revoked sooner. Performed at [facility], [address] [place], [place] [address]          Radiology Studies: Mr Cardiac Morphology W Wo Contrast  Result Date: 07/30/2018 CLINICAL DATA:  Ischemic cardiomyopathy EXAM: CARDIAC MRI TECHNIQUE: The patient was scanned on a 1.5 Tesla GE magnet. A dedicated cardiac coil was used. Functional imaging was done using Fiesta sequences. 2,3, and 4 chamber views were done to assess for RWMA's. Modified Simpson's rule using a short axis stack was used to calculate an ejection fraction on a dedicated work Conservation officer, nature. The patient received 8 cc of Multihance. After 10 minutes inversion recovery sequences were used to assess for infiltration and scar tissue. CONTRAST:  8 cc Gadavist FINDINGS: Small to moderate right pleural effusion, small left pleural effusion. Small pericardial effusion. The left ventricle is mildly dilated with normal wall thickness, EF 23%. Akinesis of the mid to apical anterior/anteroseptal/inferoseptal walls, the apical inferior and lateral walls, and the true apex. No LV apical thrombus noted. Mildly dilated right  ventricle, EF 32%. Severe left atrial enlargement. Moderate right atrial enlargement. Mild-moderate mitral regurgitation. Trileaflet aortic valve with trivial regurgitation, no stenosis. Mild-moderate tricuspid regurgitation. Delayed enhancement images: 76-99% wall thickness late gadolinium enhancement (LGE) in the mid anteroseptal and mid inferoseptal wall segments. 76-99% wall thickness LGE at the apical cap and the apical inferior wall segment. Measurements: LVEDV 246 mL LVSV 57 mL LVEF 23% RVEDV 203 mL RVSV 65 mL RVEF 32% IMPRESSION: 1. Mildly dilated LV with EF 23%, wall motion abnormalities as noted above. 2. Mildly dilated RV with EF 32%, moderately depressed systolic function. 3. Delayed enhancement presents a mixed picture. There are extensive wall motion abnormalities. The delayed enhancement images suggest that the mid anteroseptal/inferoseptal and the apical inferior  and true apex wall segments would be unlikely to improve with revascularization. However, the other affected wall segments appear viable. [name] Electronically Signed   By: Loralie Champagne M.D.   On: 07/30/2018 16:34        Scheduled Meds: . aspirin EC  81 mg Oral Daily  . carvedilol  3.125 mg Oral BID WC  . digoxin  0.125 mg Oral Daily  . ezetimibe  10 mg Oral Daily  . fenofibrate  160 mg Oral Daily  . furosemide  40 mg Oral Daily  . insulin aspart  0-15 Units Subcutaneous TID WC  . insulin aspart  0-5 Units Subcutaneous QHS  . insulin aspart  4 Units Subcutaneous TID WC  . multivitamin with minerals  1 tablet Oral Daily  . omega-3 acid ethyl esters  2 g Oral BID  . pravastatin  80 mg Oral Daily  . sacubitril-valsartan  1 tablet Oral BID  . sodium chloride flush  10-40 mL Intracatheter Q12H  . sodium chloride flush  3 mL Intravenous Q12H  . sodium chloride flush  3 mL Intravenous Q12H  . spironolactone  25 mg Oral Daily   Continuous Infusions: . sodium chloride    . heparin 1,300 Units/hr (08/01/18 0820)      LOS: 7 days     Vernell Leep, MD, FACP, [facility]. [facility]  To contact the attending provider between 7A-7P or the covering provider during after hours 7P-7A, please log into the web site [email or web address] and access using universal [place] password for that web site. If you do not have the password, please call the hospital operator.  08/01/2018, 11:38 AM

## 2018-08-01 NOTE — Progress Notes (Signed)
Patient ID: Oscar [name], male   DOB: 04-05-1940, 78 y.o.   MRN: [id number]    Advanced Heart Failure Rounding Note   Subjective:    Cath 6/5 showed 99% prox LAD lesion and moderate LM lesion (felt to be non-critical) . EF 25% RHC ok.    RA = 7 RV = 59/6 PA = 59/24 (37) PCW = 22 Fick cardiac output/index = 4.1/2.3 PVR = 3.3 WU Ao sat = 93% PA sat = 62%, 64%  Cardiac MRI IMPRESSION: 1. Mildly dilated LV with EF 23%, wall motion abnormalities as noted above. 2. Mildly dilated RV with EF 32%, moderately depressed systolic function. 3. Delayed enhancement presents a mixed picture. There are extensive wall motion abnormalities. The delayed enhancement images suggest that the mid anteroseptal/inferoseptal and the apical inferior and true apex wall segments would be unlikely to improve with revascularization. However, the other affected wall segments appear Viable.  CTA head/neck: Severe RICA stenosis at origin, 91% LICA (h/o CEA).   Denies CP or SOB.  He does not want CABG.  BP stable on current meds, CVP has been 3-5.   Objective:   Weight Range:  Vital Signs:   Temp:  [97.5 F (36.4 C)-98.1 F (36.7 C)] 97.8 F (36.6 C) (06/07 0819) Pulse Rate:  [68-90] 81 (06/07 0819) Resp:  [21-24] 21 (06/07 0400) BP: (101-153)/(62-129) 153/129 (06/07 0819) SpO2:  [92 %-97 %] 96 % (06/07 0819) Last BM Date: 07/31/18  Weight change: Filed Weights   07/24/18 2110 07/27/18 0500 07/28/18 0649  Weight: 77 kg 73.3 kg 69.5 kg    Intake/Output:   Intake/Output Summary (Last 24 hours) at 08/01/2018 0957 Last data filed at 08/01/2018 0820 Gross per 24 hour  Intake 1825.92 ml  Output 1175 ml  Net 650.92 ml     Physical Exam: General: NAD Neck: No JVD, no thyromegaly or thyroid nodule.  Lungs: Clear to auscultation bilaterally with normal respiratory effort. CV: Nondisplaced PMI.  Heart regular S1/S2, no S3/S4, no murmur.  No peripheral edema.   Abdomen: Soft, nontender, no  hepatosplenomegaly, no distention.  Skin: Intact without lesions or rashes.  Neurologic: Alert and oriented x 3.  Psych: Normal affect. Extremities: No clubbing or cyanosis.  HEENT: Normal.   Telemetry: AF 80s Personally reviewed   Labs: Basic Metabolic Panel: Recent Labs  Lab 07/28/18 0333 07/29/18 0451  07/29/18 1641 07/29/18 1642 07/30/18 0553 07/31/18 0332 08/01/18 0517  NA 138 137   < > 139 139 137 140 139  K 3.0* 3.9   < > 3.6 3.7 3.8 3.8 4.2  CL 92* 97*  --   --   --  99 104 103  CO2 31 30  --   --   --  27 26 26   GLUCOSE 167* 161*  --   --   --  152* 124* 164*  BUN 29* 26*  --   --   --  23 18 19   CREATININE 1.16 1.17  --   --   --  1.08 0.96 0.91  CALCIUM 8.7* 8.9  --   --   --  9.2 9.0 9.2  MG 2.1  --   --   --   --   --   --   --    < > = values in this interval not displayed.    Liver Function Tests: No results for input(s): AST, ALT, ALKPHOS, BILITOT, PROT, ALBUMIN in the last 168 hours. No results for input(s): LIPASE, AMYLASE  in the last 168 hours. No results for input(s): AMMONIA in the last 168 hours.  CBC: Recent Labs  Lab 07/27/18 0021 07/28/18 0333  07/29/18 1641 07/29/18 1642 07/30/18 0553 07/31/18 0332 08/01/18 0517  WBC 10.9* 10.9*  --   --   --  9.7 9.3 8.2  HGB 13.7 14.1   < > 16.0 16.0 13.9 13.9 13.3  HCT 41.9 43.9   < > 47.0 47.0 43.3 43.7 41.7  MCV 82.5 82.8  --   --   --  83.8 83.7 83.7  PLT 475* 443*  --   --   --  454* 431* 389   < > = values in this interval not displayed.    Cardiac Enzymes: Recent Labs  Lab 07/26/18 1008 07/26/18 1229 07/26/18 1706 07/27/18 0021  TROPONINI 0.08* 0.23* 0.36* 0.35*    BNP: BNP (last 3 results) Recent Labs    07/21/18 1754  BNP 2,433.0*    ProBNP (last 3 results) No results for input(s): PROBNP in the last 8760 hours.    Other results:  Imaging: Mr Cardiac Morphology W Wo Contrast  Result Date: 07/30/2018 CLINICAL DATA:  Ischemic cardiomyopathy EXAM: CARDIAC MRI  TECHNIQUE: The patient was scanned on a 1.5 Tesla GE magnet. A dedicated cardiac coil was used. Functional imaging was done using Fiesta sequences. 2,3, and 4 chamber views were done to assess for RWMA's. Modified Simpson's rule using a short axis stack was used to calculate an ejection fraction on a dedicated work Conservation officer, nature. The patient received 8 cc of Multihance. After 10 minutes inversion recovery sequences were used to assess for infiltration and scar tissue. CONTRAST:  8 cc Gadavist FINDINGS: Small to moderate right pleural effusion, small left pleural effusion. Small pericardial effusion. The left ventricle is mildly dilated with normal wall thickness, EF 23%. Akinesis of the mid to apical anterior/anteroseptal/inferoseptal walls, the apical inferior and lateral walls, and the true apex. No LV apical thrombus noted. Mildly dilated right ventricle, EF 32%. Severe left atrial enlargement. Moderate right atrial enlargement. Mild-moderate mitral regurgitation. Trileaflet aortic valve with trivial regurgitation, no stenosis. Mild-moderate tricuspid regurgitation. Delayed enhancement images: 76-99% wall thickness late gadolinium enhancement (LGE) in the mid anteroseptal and mid inferoseptal wall segments. 76-99% wall thickness LGE at the apical cap and the apical inferior wall segment. Measurements: LVEDV 246 mL LVSV 57 mL LVEF 23% RVEDV 203 mL RVSV 65 mL RVEF 32% IMPRESSION: 1. Mildly dilated LV with EF 23%, wall motion abnormalities as noted above. 2. Mildly dilated RV with EF 32%, moderately depressed systolic function. 3. Delayed enhancement presents a mixed picture. There are extensive wall motion abnormalities. The delayed enhancement images suggest that the mid anteroseptal/inferoseptal and the apical inferior and true apex wall segments would be unlikely to improve with revascularization. However, the other affected wall segments appear viable. Tannya Gonet Electronically Signed   By:  Loralie Champagne M.D.   On: 07/30/2018 16:34     Medications:     Scheduled Medications: . aspirin EC  81 mg Oral Daily  . carvedilol  3.125 mg Oral BID WC  . digoxin  0.125 mg Oral Daily  . ezetimibe  10 mg Oral Daily  . fenofibrate  160 mg Oral Daily  . furosemide  40 mg Oral Daily  . insulin aspart  0-15 Units Subcutaneous TID WC  . insulin aspart  0-5 Units Subcutaneous QHS  . insulin aspart  4 Units Subcutaneous TID WC  . multivitamin with minerals  1 tablet Oral Daily  . omega-3 acid ethyl esters  2 g Oral BID  . pravastatin  80 mg Oral Daily  . sacubitril-valsartan  1 tablet Oral BID  . sodium chloride flush  10-40 mL Intracatheter Q12H  . sodium chloride flush  3 mL Intravenous Q12H  . sodium chloride flush  3 mL Intravenous Q12H  . spironolactone  25 mg Oral Daily  . warfarin  10 mg Oral ONCE-1800  . Warfarin - Pharmacist Dosing Inpatient   Does not apply q1800    Infusions: . sodium chloride    . heparin 1,300 Units/hr (08/01/18 0556)    PRN Medications: sodium chloride, acetaminophen **OR** acetaminophen (TYLENOL) oral liquid 160 mg/5 mL **OR** acetaminophen, albuterol, calcium carbonate, hydrALAZINE, ipratropium-albuterol, ondansetron (ZOFRAN) IV, senna-docusate, sodium chloride flush, sodium chloride flush   Assessment/Plan:   1. Acute systolic HF -> cardiogenic shock  - Echo this admit EF 20-25% with RWMA - Echo 7/19 EF 65% - Cath on 6/5: Left dominant system  LM 50-60% LAD 99% pLCx 60% RCA non-dominant occluded - Patient refusing CABG eval. Dr. Haroldine Laws reviewed films with Dr. Burt Knack and feels LAD is more like a CTO. cMRI ordered to look for viability => This was a mixed picture showing areas that may improve with revascularization and areas that likely would not improve with revascularization in the LAD distribution. Could consider high-risk PCI with atherectomy and Impella support.  I discussed PCI with patient today.  Unless there would be significant  recovery with minimal risk, he is really not interested.  He wants to pursue medical management for now.  - Volume status improved. CVP not elevated. Start back on Lasix 40 mg daily for home.  - Co-ox adequate at 60%, continue digoxin. - Continue current digoxin, spiro, Entresto 24/26.   2. CAD - plan as above - continue ASA, statin.  - Once warfarin back on board can stop ASA  3. Acute hypoxic respiratory failure - Improved with diuresis  4. Chronic AF - rate ok - on heparin with INR < 2.0, warfarin has been restarted, INR 1.3.  With recent TIA, will need bridging to therapeutic INR (could go home on Lovenox bridge when ready for discharge).  Will hold further warfarin until he has been seen by [facility] tomorrow.   5. TIA with severe right carotid stenosis - [facility] plans to see again tomorrow for severe right carotid stenosis at origin, will need to discuss CEA.  He is stable symptomatically and hemodynamically and no further cardiac interventions planned.  Will hold warfarin until he has been seen by [facility] again.   6. DM2 - per primary team    Will need evaluation by [facility] Monday.  No further cardiac interventions planned.  He is stable currently, will need to decide on CEA.  If not done this admission, could potentially go home with Lovenox bridge tomorrow.   Length of Stay: 7   Loralie Champagne MD 08/01/2018, 9:57 AM  Advanced Heart Failure Team Pager [phone] (M-F; 7a - 4p)  Please contact [facility] for night-coverage after hours (4p -7a ) and weekends on [email or web address]

## 2018-08-01 NOTE — Progress Notes (Signed)
ANTICOAGULATION CONSULT NOTE  Pharmacy Consult:  Heparin Indication: atrial fibrillation, MV CAD  Allergies  Allergen Reactions  . Penicillins Hives    Blisters on hands  . Statins     Joint and muscle pain and low energy.    Patient Measurements: Height: 5\' 6"  (167.6 cm) Weight: 153 lb 3.2 oz (69.5 kg) IBW/kg (Calculated) : 63.8  HEPARIN DW (KG): 77  Vital Signs: Temp: 97.7 F (36.5 C) (06/07 0331) Temp Source: Oral (06/07 0331) BP: 122/91 (06/07 0649) Pulse Rate: 81 (06/07 0649)  Labs: Recent Labs    07/30/18 0553  07/30/18 1820 07/31/18 0332 08/01/18 0517  HGB 13.9  --   --  13.9 13.3  HCT 43.3  --   --  43.7 41.7  PLT 454*  --   --  431* 389  LABPROT 19.0*  --   --  17.1* 16.1*  INR 1.6*  --   --  1.4* 1.3*  HEPARINUNFRC  --    < > 0.34 0.41 0.56  CREATININE 1.08  --   --  0.96 0.91   < > = values in this interval not displayed.    Estimated Creatinine Clearance: 59.4 mL/min (by C-G formula based on SCr of 0.91 mg/dL).   Assessment: 64 Oscar [name] here with AMS and TIA. Oscar [name] on warfarin PTA for afib and pharmacy was consulted to dose.  Patient continues on heparin bridge while undergoing cardiac surgery workup, currently refusing CABG.   Heparin level [name] therapeutic; no bleeding reported. INR down to 1.3. Warfarin resumed last night.   Goal of Therapy:  INR goal 2-3 Heparin level 0.3-0.7 units/mL Monitor platelets by anticoagulation protocol: Yes   Plan:  Continue heparin gtt at 1300 units/hr Repeat warfarin 10mg  tonight  Erin Hearing PharmD., BCPS Clinical Pharmacist 08/01/2018 7:46 AM  Addendum: Oscar [name] warfarin - give 10mg  tonight.

## 2018-08-02 ENCOUNTER — Encounter (HOSPITAL_COMMUNITY)
Admission: EM | Disposition: A | Discharge status: Home / Self Care | Payer: Self-pay | Source: Home / Self Care | Attending: Internal Medicine

## 2018-08-02 ENCOUNTER — Telehealth: Payer: Self-pay | Admitting: *Deleted

## 2018-08-02 ENCOUNTER — Inpatient Hospital Stay (HOSPITAL_COMMUNITY): Payer: Medicare Other

## 2018-08-02 DIAGNOSIS — E1165 Type 2 diabetes mellitus with hyperglycemia: Secondary | ICD-10-CM

## 2018-08-02 LAB — COOXEMETRY PANEL
Carboxyhemoglobin: 1.5 % (ref 0.5–1.5)
Methemoglobin: 0.7 % (ref 0.0–1.5)
O2 Saturation: 59.4 %
Total hemoglobin: 13.8 g/dL (ref 12.0–16.0)

## 2018-08-02 LAB — GLUCOSE, CAPILLARY
Glucose-Capillary: 149 mg/dL — ABNORMAL HIGH (ref 70–99)
Glucose-Capillary: 161 mg/dL — ABNORMAL HIGH (ref 70–99)
Glucose-Capillary: 148 mg/dL — ABNORMAL HIGH (ref 70–99)

## 2018-08-02 LAB — CBC
HCT: 40.4 % (ref 39.0–52.0)
Hemoglobin: 12.9 g/dL — ABNORMAL LOW (ref 13.0–17.0)
MCH: 26.9 pg (ref 26.0–34.0)
MCHC: 31.9 g/dL (ref 30.0–36.0)
MCV: 84.2 fL (ref 80.0–100.0)
Platelets: 362 10*3/uL (ref 150–400)
RBC: 4.8 MIL/uL (ref 4.22–5.81)
RDW: 17.4 % — ABNORMAL HIGH (ref 11.5–15.5)
WBC: 8.3 10*3/uL (ref 4.0–10.5)
nRBC: 0 % (ref 0.0–0.2)

## 2018-08-02 LAB — BASIC METABOLIC PANEL
Anion gap: 8 (ref 5–15)
BUN: 20 mg/dL (ref 8–23)
CO2: 25 mmol/L (ref 22–32)
Calcium: 9.1 mg/dL (ref 8.9–10.3)
Chloride: 104 mmol/L (ref 98–111)
Creatinine, Ser: 0.99 mg/dL (ref 0.61–1.24)
GFR calc Af Amer: >60 mL/min (ref >60)
GFR calc non Af Amer: >60 mL/min (ref >60)
Glucose, Bld: 153 mg/dL — ABNORMAL HIGH (ref 70–99)
Potassium: 4.2 mmol/L (ref 3.5–5.1)
Sodium: 137 mmol/L (ref 135–145)

## 2018-08-02 LAB — HEPARIN LEVEL (UNFRACTIONATED): Heparin Unfractionated: 0.61 IU/mL (ref 0.30–0.70)

## 2018-08-02 LAB — PROTIME-INR
INR: 1.5 — ABNORMAL HIGH (ref 0.8–1.2)
Prothrombin Time: 17.9 seconds — ABNORMAL HIGH (ref 11.4–15.2)

## 2018-08-02 SURGERY — CORONARY ARTERY BYPASS GRAFTING (CABG)
Anesthesia: General | Site: Chest

## 2018-08-02 MED ORDER — ENOXAPARIN SODIUM 60 MG/0.6ML ~~LOC~~ SOLN: 60.0000 mg | Freq: Two times a day (BID) | SUBCUTANEOUS | 0 refills | Status: DC

## 2018-08-02 MED ORDER — CARVEDILOL 3.125 MG PO TABS: 3.1250 mg | ORAL_TABLET | Freq: Two times a day (BID) | ORAL | 0 refills | Status: DC

## 2018-08-02 MED ORDER — ASPIRIN 81 MG PO TBEC: 81.0000 mg | DELAYED_RELEASE_TABLET | Freq: Every day | ORAL | 0 refills | Status: DC

## 2018-08-02 MED ORDER — WARFARIN SODIUM 10 MG PO TABS
10.0000 mg | ORAL_TABLET | Freq: Once | ORAL | Status: AC
  Administered 2018-08-02: 17:00:00 10 mg via ORAL
  Filled 2018-08-02: qty 1

## 2018-08-02 MED ORDER — WARFARIN - PHARMACIST DOSING INPATIENT: Freq: Every day | Status: DC

## 2018-08-02 MED ORDER — FUROSEMIDE 40 MG PO TABS: 40.0000 mg | ORAL_TABLET | Freq: Every day | ORAL | 0 refills | Status: DC

## 2018-08-02 MED ORDER — ENOXAPARIN SODIUM 80 MG/0.8ML ~~LOC~~ SOLN: 1.0000 mg/kg | Freq: Two times a day (BID) | SUBCUTANEOUS | Status: DC

## 2018-08-02 MED ORDER — WARFARIN SODIUM 7.5 MG PO TABS
7.5000 mg | ORAL_TABLET | Freq: Every day | ORAL | Status: AC
Start: 2018-08-03 — End: ?

## 2018-08-02 MED ORDER — SACUBITRIL-VALSARTAN 24-26 MG PO TABS: 1.0000 | ORAL_TABLET | Freq: Two times a day (BID) | ORAL | 0 refills | Status: DC

## 2018-08-02 MED ORDER — SPIRONOLACTONE 25 MG PO TABS: 25.0000 mg | ORAL_TABLET | Freq: Every day | ORAL | 0 refills | Status: DC

## 2018-08-02 MED ORDER — ENOXAPARIN SODIUM 60 MG/0.6ML ~~LOC~~ SOLN
60.0000 mg | Freq: Once | SUBCUTANEOUS | Status: DC
  Filled 2018-08-02: qty 0.6

## 2018-08-02 MED ORDER — ALBUTEROL SULFATE HFA 108 (90 BASE) MCG/ACT IN AERS
2.0000 | INHALATION_SPRAY | Freq: Four times a day (QID) | RESPIRATORY_TRACT | Status: AC | PRN
Start: 2018-08-02 — End: ?

## 2018-08-02 MED FILL — ASPIRIN LOW DOSE 81 MG TBEC: 81 | 30 days supply | Qty: 30 | Fill #0

## 2018-08-02 MED FILL — ENOXAPARIN 60 MG/0.6 ML SYR: 60 | 5 days supply | Qty: 6 | Fill #0

## 2018-08-02 MED FILL — CARVEDILOL 3.125 MG TABLET: 3.125 | 30 days supply | Qty: 60 | Fill #0

## 2018-08-02 MED FILL — FUROSEMIDE 40 MG TABLET: 40 | 30 days supply | Qty: 30 | Fill #0

## 2018-08-02 MED FILL — ENTRESTO 24 MG-26 MG TABLET: 24-26 | 30 days supply | Qty: 60 | Fill #0

## 2018-08-02 MED FILL — SPIRONOLACTONE 25 MG TABLET: 25 | 30 days supply | Qty: 30 | Fill #0

## 2018-08-02 NOTE — TOC Benefit Eligibility Note (Signed)
Transition of Care [facility]) Benefit Eligibility Note    Patient Details  Name: Oscar [name] MRN: [id number] Date of Birth: 12-26-1940   Medication/Dose: LOVENOX  60 MG DAILY  (ENOXAPARIN 60 MG DAILY  7 SYRINGES, COVER- YES ,CO-PAY- $ 26.00, P/A- NO)  Covered?: Yes        Spoke with Person/Company/Phone Number:: [employer] # [phone])  Co-Pay: $ 26.00  Prior Approval: No     Additional Notes: ENTRESTO  24-26 MG  BID, COVER- YES, CO-PAY- $33.00 , P/A- YES # [phone]    Memory Argue Phone Number: 08/02/2018, 2:51 PM

## 2018-08-02 NOTE — Progress Notes (Signed)
Patient ID: Oscar [name], male   DOB: 10/31/1940, 78 y.o.   MRN: [id number] Chart reviewed during ongoing cardiac work-up.  Again had a discussion with patient regarding options.  He does have known high-grade right carotid stenosis.  Impossible to say whether this was symptomatic or asymptomatic.  I explained that his annual risk of neurologic event if asymptomatic would be 5% in the much higher if this was the symptomatic because of his recent neurologic event.  He has refused consideration for coronary artery bypass grafting and also percutaneous intervention.  I did explain that this puts him at some increased risk for cardiac event with carotid surgery.  In reviewing cardiology notes, the feeling is that he is optimized and could proceed with carotid surgery with known increased risk.  He certainly is much more stable from a respiratory standpoint than during his admission.  He would like to proceed with right carotid endarterectomy but does not want to do it during this admission.  He wants to be readmitted at the end of June for surgery.  He understands he will require Lovenox bridge to come off Coumadin for surgery.  We will schedule surgery for the end of June and feel he is stable for discharge from vascular standpointy

## 2018-08-02 NOTE — Telephone Encounter (Signed)
Spoke with patient on the phone about scheduling surgery with Dr. Donnetta Hutching. He stated he wanted to get home and call me back to schedule Right CEA after he gets home. Contact information given to patient.

## 2018-08-02 NOTE — Progress Notes (Signed)
RN notified of witnessed pt fall by Lattie Haw, [facility] at approximately 1055AM.   RN got to room pt was sitting in chair assisted by Pharmacist. No blunt trauma noted, no bruising. Pt stated no pain. Previous Left elbow skin tear, oozing. Placed mepilex foam place over site. Pt assisted to bed by RN and RPH with PIV intact and heparin infusing. Bed alarm on, pt instructed to stay in bed until after xr confirmation. Dr. Haroldine Laws to order xr of hip. RN called Dr. Algis Liming to update, Dr. Haroldine Laws had already contacted him. If XR clear still OK to DC later today.   RN will continue to monitor. Spouse updated and aware of fall. Pt has had recent fall at home from bed to floor and hit head/face according to [facility], wife. Bed alarm on.

## 2018-08-02 NOTE — Plan of Care (Addendum)
  Problem: Education: Goal: Knowledge of General Education information will improve Description Including pain rating scale, medication(s)/side effects and non-pharmacologic comfort measures Outcome: Adequate for Discharge   Problem: Health Behavior/Discharge Planning: Goal: Ability to manage health-related needs will improve Outcome: Adequate for Discharge   Problem: Clinical Measurements: Goal: Ability to maintain clinical measurements within normal limits will improve Outcome: Adequate for Discharge Goal: Will remain free from infection Outcome: Adequate for Discharge Goal: Diagnostic test results will improve Outcome: Adequate for Discharge Goal: Respiratory complications will improve Outcome: Adequate for Discharge Goal: Cardiovascular complication will be avoided Outcome: Adequate for Discharge   Problem: Activity: Goal: Risk for activity intolerance will decrease Outcome: Adequate for Discharge   Problem: Nutrition: Goal: Adequate nutrition will be maintained Outcome: Adequate for Discharge   Problem: Coping: Goal: Level of anxiety will decrease Outcome: Adequate for Discharge   Problem: Elimination: Goal: Will not experience complications related to bowel motility Outcome: Adequate for Discharge Goal: Will not experience complications related to urinary retention Outcome: Adequate for Discharge   Problem: Pain Managment: Goal: General experience of comfort will improve Outcome: Adequate for Discharge   Problem: Safety: Goal: Ability to remain free from injury will improve Outcome: Adequate for Discharge   Problem: Skin Integrity: Goal: Risk for impaired skin integrity will decrease Outcome: Adequate for Discharge   Problem: Education: Goal: Knowledge of disease or condition will improve Outcome: Adequate for Discharge Goal: Knowledge of secondary prevention will improve Outcome: Adequate for Discharge Goal: Knowledge of patient specific risk factors  addressed and post discharge goals established will improve Outcome: Adequate for Discharge Goal: Individualized Educational Video(s) Outcome: Adequate for Discharge   Problem: Health Behavior/Discharge Planning: Goal: Ability to manage health-related needs will improve Outcome: Adequate for Discharge   Problem: Ischemic Stroke/TIA Tissue Perfusion: Goal: Complications of ischemic stroke/TIA will be minimized Outcome: Adequate for Discharge    Pt educated and returned demo on lovenox injection. Performed well.  DC instructions given to patient, questions answered. Educated on medication regimen, restrictions, daily weights, follow up appointments. PIV DC, hemostasis achieved. IV team DC PICC line. TOC pharmacy brought Rx, including lovenox. Pt understands he has to take a dose tonight and get labs checked Thursday. Coumadin dose given prior to DC. All belongings sent home with patient.   Delayed DC d/t awaiting pt ride and must remain in bed 30 minutes post PICC line pull.   Pt escorted via wheelchair by NT to private vehicle driven by spouse.

## 2018-08-02 NOTE — Progress Notes (Signed)
ANTICOAGULATION CONSULT NOTE  Pharmacy Consult:  Heparin Indication: atrial fibrillation, MV CAD  Allergies  Allergen Reactions  . Penicillins Hives    Blisters on hands  . Statins     Joint and muscle pain and low energy.    Patient Measurements: Height: 5\' 6"  (167.6 cm) Weight: 153 lb 3.2 oz (69.5 kg) IBW/kg (Calculated) : 63.8  HEPARIN DW (KG): 77  Vital Signs: Temp: 97.6 F (36.4 C) (06/08 0744) Temp Source: Oral (06/08 0744) BP: 113/65 (06/08 0744) Pulse Rate: 95 (06/08 0744)  Labs: Recent Labs    07/31/18 0332 08/01/18 0517 08/02/18 0500  HGB 13.9 13.3 12.9*  HCT 43.7 41.7 40.4  PLT 431* 389 362  LABPROT 17.1* 16.1* 17.9*  INR 1.4* 1.3* 1.5*  HEPARINUNFRC 0.41 0.56 0.61  CREATININE 0.96 0.91 0.99    Estimated Creatinine Clearance: 54.6 mL/min (by C-G formula based on SCr of 0.99 mg/dL).   Assessment: 66 YOM here with AMS and TIA. He is on warfarin PTA for afib and pharmacy was consulted to dose.  Patient continues on heparin bridge while undergoing cardiac surgery workup, currently refusing CABG.   Heparin level is therapeutic at 0.61 on heparin 1300 units/hr; no bleeding reported. INR 1.5, warfarin not given last night in anticipation for [facility] consult today. Plans for CEA late this month.   Patient likely will discharge today.  Goal of Therapy:  INR goal 2-3 Monitor platelets by anticoagulation protocol: Yes   Plan:  DC heparin. Start enoxaparin 60 mg Brooke BID while bridging to therapeutic warfarin. Warfarin 10 mg tonight, 7.5 mg tomorrow, then continue home dose warfarin 7.5mg  MWThSa  Patient will coordinate INR appointment this Thursday   Claiborne Billings, PharmD PGY2 Cardiology Pharmacy Resident Please check AMION for all Pharmacist numbers by unit 08/02/2018 8:01 AM

## 2018-08-02 NOTE — Progress Notes (Signed)
Patient had fall in room.  I saw him lose balance from the hall and ran into room.  Patient was standing at sink reaching to get something and lost balance and fell backwards landing on right arm, elbow and hip/bottom.  He did not hit head.  He was helped to chair.    Bonnita Nasuti Pharm.D. CPP, BCPS Clinical Pharmacist [phone] 08/02/2018 11:01 AM

## 2018-08-02 NOTE — Progress Notes (Signed)
CARDIAC REHAB PHASE I   PRE:  Rate/Rhythm: 80 afib    BP: sitting 144/81    SaO2: 94 RA  MODE:  Ambulation: 600 ft   POST:  Rate/Rhythm: 96 afib    BP: sitting 134/68     SaO2: wouldn't register  Pt tolerated well. Steady on his feet. No c/o, feels well. Discussed HF booklet with pt including daily wts, low sodium, taking meds, walking on his own. Encouraged easy walking before his carotid surgery. He is not interested in CRPII due to all he has coming up.  [place], ACSM 08/02/2018 2:33 PM

## 2018-08-02 NOTE — Progress Notes (Deleted)
We received an order as a CABG preop eval. Since pt is not having surgery, we need a new order. Please order Cardiac Rehab Phase I if our services are warranted. ThxYves Dill CES, ACSM 10:49 AM 08/02/2018

## 2018-08-02 NOTE — Discharge Summary (Signed)
Physician Discharge Summary  Oscar [name] XLK:[id number] DOB: February 04, 1941  PCP: No primary care provider on file.  Admit date: 07/24/2018 Discharge date: 08/02/2018  Recommendations for Outpatient Follow-up:  1. Dr. Edrick Oh, PCP on 08/05/2018 at 2 AM for lab draw (CBC, BMP, PT & INR) and follow-up.  Lovenox to be discontinued when INR is therapeutic >2. 2. Dr. Curt Jews, Vascular Surgery: Follow-up to schedule carotid surgery. 3. Dr. Marcial Pacas, Neurology in 4 weeks.  Ambulatory referral has been sent. 4. Dr. Minus Breeding, Cardiology: Office will arrange follow-up. 5. Recommend repeating CT chest abdomen and pelvis to further evaluate mediastinal adenopathy noted on CT chest 5/31. 6. Recommend outpatient Hematology consultation regarding further evaluation of mediastinal adenopathy.  Home Health: None Equipment/Devices: None  Discharge Condition: Improved and stable CODE STATUS: Full Diet recommendation: Heart healthy & diabetic diet.  Discharge Diagnoses:  Active Problems:   ATRIAL FIBRILLATION   History of left-sided carotid endarterectomy   Carotid artery stenosis   TIA (transient ischemic attack)   Brief Summary: 78 year old married male, PMH of permanent A. fib on warfarin, COPD on home oxygen 4 L/min at bedtime, chronic systolic CHF with subsequent normalization of EF, nonobstructive CAD, DM 2, HTN, HLD, carotid artery disease status post left CEA in 2007, presented to ED for evaluation of transient episode of AMS, slurred speech and balance difficulty.  He was admitted for suspected TIA and found to have occluded right ICA.  Hospital course complicated by acute new systolic CHF with EF 36-64% and acute respiratory failure with hypoxia.  Cardiology consulted.  Aggressively diuresed when clinically euvolemic.  Underwent cardiac cath that showed severe three-vessel CAD.  Patient declines evaluation for CABG or PCI.  Patient declines carotid surgery this admission.   Assessment  & Plan:  Acute on chronic respiratory failure with hypoxia -On home oxygen 4 L/min at bedtime. -Acute respiratory failure likely related to acute CHF -Acute respiratory failure resolved.  New acute sytolic heart failure/cardiomyopathy/cardiogenic shock -Echocardiogram showed EF 40-34%, LV diastolic parameters indeterminate. Only base of left ventricle moves well. RV moderately reduced systolic function. -LVEF 65% on 7/19. -Previous cath with nonobstructive CAD. -Cardiology consultation and follow-up appreciated. - Diuresed well with IV Lasix and clinically euvolemic.  Now on oral Lasix 40 mg daily and spironolactone. - Continue digoxin, spironolactone. Entresto, low-dose carvedilol. - Repeat cardiac cath 6/4 shows severe three-vessel CAD.  Seen by thoracic surgery but patient declined surgery.  Cardiology has discussed risks and benefits of high risk PCI with patient and he has declined that as well.  Also thoracic surgery opined that due to patient's severe ischemic cardiomyopathy with significant scarring, he would not benefit from CABG. -Seen by cardiology, discussed with them and cleared for discharge home.  Patient will follow-up with Dr. Percival Spanish in [place] which is closer to his home then coming to the heart failure clinic.  Chest tightness/elevated troponin/severe three-vessel CAD -Troponin peaked to 0.36 but plateaued.  Elevated troponin likely due to demand ischemia from A. fib with RVR and acute CHF. -Previous cath with nonobstructive CAD and plan for repeat cath this afternoon. - Repeat cardiac cath 6/4 shows severe three-vessel CAD.  Patient has currently declined CABG or high risk PCI.  Medical management as noted above. -As per cardiology follow-up, patient is stable symptomatically and hemodynamically and no further cardiac interventions planned. -For medical therapy.  TIA -Patient presented with a brief episode of altered mental status, slurred speech, unsteady  gait -CT head with no acute findings -CTA head and  neck with significant right carotid artery narrowing -MRI showed no acute or subacute finding -Carotid Doppler:Bilateral ICA 1 to 39% stenosis bilateral vertebral arteriesdemonstrate antegrade flow -History of atrial fibrillation on anticoagulation with Coumadin -Echocardiogram results as above. -LDL55, Hemoglobin A1c6.9 -Neurology consulted and appreciated, recommended vascular consultation  -Continue aspirin, statin. INR subtherapeutic.  He will be discharged on Lovenox bridging until INR therapeutic >2. -PT assessed patient and recommended no further therapy -Outpatient follow-up with neurology.  Severe right carotid artery stenosis -CTA head and neck showed significant stenosis R ICA -History of left carotid endarterectomy -Continue aspirin, statins and Coumadin anticoagulation -Vascular surgery saw him today and patient declined carotid endarterectomy at this time and wants to pursue it at the end of June.  He will need to be bridged back to Lovenox for that surgery.  Essential hypertension -Continue low-dose carvedilol, Entresto, Aldactone and Lasix.  Lisinopril discontinued this admission.  Hyperlipidemia -Takes Zetia, fenofibrate, pravastatin -lipid panel: total cholesterol 95, HDL 17, LDL 55, TG 113 -patient has not been on welchol in months  Diabetes mellitus, type II -A1c 6.9.   -Treated in the hospital with insulins.  Home oral hypoglycemics resumed at discharge on which he had reasonable control at home.  Chronic atrial fibrillation, now with RVR -Suspect RVR secondary to respiratory status -CHADSVASC 8 -Continue digoxin, low-dose carvedilol for rate control.  Controlled ventricular rate. -INR 1.5 -Coumadin was interrupted in the hospital for procedures.  INR is subtherapeutic.  Given his recent TIA, right carotid artery stenosis and A. fib, as per cardiology, he will be discharged on Lovenox bridging until  INR is therapeutic >2.  Pharmacy has arranged for Lovenox supplies and patient has an appointment as above for INR check in the next 72 hours.  Nursing has educated patient to self administer Lovenox.  Upper thoracic adenopathy -found on CTA head/neck -CT chest: Multiple enlarged medium sized mediastinal lymph nodes. Differential would include reactive adenopathy versus lymphoproliferative process.  Radiologist favors lymphoproliferative process.  Recommended repeat CT chest, abdomen pelvis with contrast in 1 month and outpatient hematology consultation.Marland Kitchen  NSVT Vs A. fib with aberrancy -Noted 28 beat wide complex nonsustained tachycardia on 6/2 at 3:07 AM. -Continue monitoring on telemetry. -Replace potassium to >4.  Magnesium 2.1. -Starting carvedilol 3.125 mg twice daily -No recurrence noted.  Hypokalemia -Replaced.  Magnesium 2.1.  Witnessed mechanical fall Cardiology witnessed patient having a mechanical fall while ambulating in the room.  He was able to break the fall with his hands and IV pole.  He fell on his right hip which was slightly sore but no head trauma or outward evidence of injury or deformity.  I discussed with Dr. Haroldine Laws.  X-ray of the right hip without acute findings.  Patient able to ambulate without difficulty.   Consultants:  Neurology Vascular Surgery Cardiology- CHF team Cardiothoracic surgery.  Procedures:  Carotid doppler Echocardiogram RUE PICC line  Cardiac cath 6/4: Assessment: 1. Severe 3v CAD in left dominant system with 70-80% LM lesion and 99% proximal LAD lesion 2. Ischemic CM EF 25% 3. Mild to moderate pulmonary HTN 4. Moderately reduced CO  Discharge Instructions  Discharge Instructions    (HEART FAILURE PATIENTS) Call MD:  Anytime you have any of the following symptoms: 1) 3 pound weight gain in 24 hours or 5 pounds in 1 week 2) shortness of breath, with or without a dry hacking cough 3) swelling in the hands, feet or stomach  4) if you have to sleep on extra pillows at night  in order to breathe.   Complete by:  As directed    Ambulatory referral to Neurology   Complete by:  As directed    An appointment is requested in approximately: 4 weeks.  Patient was Dr. Rhea Belton patient before   Call MD for:   Complete by:  As directed    Strokelike symptoms.   Call MD for:  difficulty breathing, headache or visual disturbances   Complete by:  As directed    Call MD for:  extreme fatigue   Complete by:  As directed    Call MD for:  persistant dizziness or light-headedness   Complete by:  As directed    Call MD for:  severe uncontrolled pain   Complete by:  As directed    Diet - low sodium heart healthy   Complete by:  As directed    Diet Carb Modified   Complete by:  As directed    Increase activity slowly   Complete by:  As directed        Medication List    STOP taking these medications   amLODipine 2.5 MG tablet Commonly known as:  NORVASC   colesevelam 625 MG tablet Commonly known as:  WELCHOL   lisinopril 40 MG tablet Commonly known as:  ZESTRIL   Potassium Chloride ER 20 MEQ Tbcr     TAKE these medications   albuterol 108 (90 Base) MCG/ACT inhaler Commonly known as:  ProAir HFA Inhale 2 puffs into the lungs every 6 (six) hours as needed for wheezing or shortness of breath. What changed:    when to take this  reasons to take this   aspirin 81 MG EC tablet Take 1 tablet (81 mg total) by mouth daily. Start taking on:  August 03, 2018   carvedilol 3.125 MG tablet Commonly known as:  COREG Take 1 tablet (3.125 mg total) by mouth 2 (two) times daily with a meal. What changed:    medication strength  See the new instructions.   digoxin 0.125 MG tablet Commonly known as:  LANOXIN Take 1 tablet (0.125 mg total) by mouth daily.   enoxaparin 60 MG/0.6ML injection Commonly known as:  LOVENOX Inject 0.6 mLs (60 mg total) into the skin every 12 (twelve) hours.   ezetimibe 10 MG  tablet Commonly known as:  ZETIA Take 10 mg by mouth daily.   fenofibrate 160 MG tablet Take 160 mg by mouth daily.   Fish Oil 1200 MG Caps Take by mouth 3 (three) times daily.   furosemide 40 MG tablet Commonly known as:  LASIX Take 1 tablet (40 mg total) by mouth daily. What changed:    when to take this  Another medication with the same name was removed. Continue taking this medication, and follow the directions you see here.   glipiZIDE 10 MG 24 hr tablet Commonly known as:  GLUCOTROL XL TAKE 1 TABLET BY MOUTH TWICE DAILY   linagliptin 5 MG Tabs tablet Commonly known as:  TRADJENTA Take 5 mg by mouth daily.   metFORMIN 500 MG 24 hr tablet Commonly known as:  GLUCOPHAGE-XR Take 500 mg by mouth 2 (two) times daily.   multivitamin tablet Take 1 tablet by mouth daily.   pravastatin 40 MG tablet Commonly known as:  PRAVACHOL Take 2 tablets by mouth daily.   sacubitril-valsartan 24-26 MG Commonly known as:  ENTRESTO Take 1 tablet by mouth 2 (two) times daily.   spironolactone 25 MG tablet Commonly known as:  ALDACTONE Take 1  tablet (25 mg total) by mouth daily. Start taking on:  August 03, 2018   warfarin 7.5 MG tablet Commonly known as:  COUMADIN Take 1 tablet (7.5 mg total) by mouth daily. Start taking on:  August 03, 2018 What changed:  additional instructions      Follow-up Information    Marcial Pacas, MD. Schedule an appointment as soon as possible for a visit in 4 week(s).   Specialty:  Neurology Contact information: [address] [place] [address] [phone]        Dr Edrick Oh. Go on 08/05/2018.   Why:  Please go to Dr Mart Piggs office for blood draw (CBC, BMP, PT & INR) on 6/11 at 11am.  Have to stop Lovenox when INR is greater than 2.       [name], MD. Schedule an appointment as soon as possible for a visit.   Specialties:  Vascular Surgery, Cardiology Why:  Follow up re Carotid Surgery scheduling. Contact information: [place] [address] [phone]        Minus Breeding, MD. Schedule an appointment as soon as possible for a visit.   Specialty:  Cardiology Contact information: [place] [address] [phone]          Allergies  Allergen Reactions  . Penicillins Hives    Blisters on hands  . Statins     Joint and muscle pain and low energy.      Procedures/Studies: Ct Angio Head W Or Wo Contrast  Result Date: 07/24/2018 CLINICAL DATA:  Ataxia with stroke suspected EXAM: CT ANGIOGRAPHY HEAD AND NECK TECHNIQUE: Multidetector CT imaging of the head and neck was performed using the standard protocol during bolus administration of intravenous contrast. Multiplanar CT image reconstructions and MIPs were obtained to evaluate the vascular anatomy. Carotid stenosis measurements (when applicable) are obtained utilizing NASCET criteria, using the distal internal carotid diameter as the denominator. CONTRAST:  77mL OMNIPAQUE IOHEXOL 350 MG/ML SOLN COMPARISON:  Noncontrast head CT earlier today FINDINGS: CTA NECK FINDINGS Aortic arch: Atherosclerotic plaque.  Three vessel branching. Right carotid system: Bulky calcified plaque at the bifurcation with high-grade proximal ICA narrowing difficult to quantify due to degree of calcified plaque blooming. Downstream vessel is smooth and patent. Left carotid system: Mixed density plaque along the common carotid artery. Patulous bifurcation and proximal ICA after endarterectomy. There is narrowing at the presumed proximal anastomosis which measures 40% (narrowed segment compared to the more proximal vessel given the immediate subsequent bifurcation and postoperative patulous appearance). Vertebral arteries: Proximal subclavian atherosclerosis. Calcified plaque at the left vertebral origin with moderate narrowing. The downstream vessels are smooth and widely patent. Skeleton: Negative Other neck: No emergent finding Upper chest: There are  layering pleural effusions. Borderline interlobular septal thickening with airway thickening. There are enlarged mediastinal lymph nodes which are partially covered, measuring 17 mm at the left paratracheal station. Review of the MIP images confirms the above findings CTA HEAD FINDINGS Anterior circulation: Suboptimal bolus density. Calcified plaque along the bilateral carotid siphons without evidence of severe narrowing. Posterior circulation: The vertebral and basilar arteries are smooth and widely patent. Negative for branch occlusion or flow limiting stenosis. Negative for aneurysm. Venous sinuses: Patent Anatomic variants: None significant Delayed phase: Not obtained in the emergent setting Review of the MIP images confirms the above findings IMPRESSION: 1. No emergent finding. 2. Severe atheromatous narrowing at the right ICA origin to the degree that the lumen is not measurable. 3. Left  carotid endarterectomy with ~40% stenosis at the proximal anastomosis. 4. Moderate atheromatous narrowing at the left vertebral origin. 5. Upper thoracic adenopathy that is partially covered. Recommend dedicated chest CT after convalescence. 6. Layering pleural effusions on both sides. Electronically Signed   By: Monte Fantasia M.D.   On: 07/24/2018 16:49   Dg Chest 2 View  Result Date: 07/26/2018 CLINICAL DATA:  Shortness of breath EXAM: CHEST - 2 VIEW COMPARISON:  07/24/2018 FINDINGS: Bilateral pleural effusions are noted similar to that seen on prior CT examination. Central vascular congestion is again noted. Mild interstitial edema is noted. No bony abnormality is seen. IMPRESSION: Changes of CHF stable from the previous exam. Electronically Signed   By: Inez Catalina M.D.   On: 07/26/2018 08:31   Ct Head Wo Contrast  Result Date: 07/24/2018 CLINICAL DATA:  Ataxia EXAM: CT HEAD WITHOUT CONTRAST TECHNIQUE: Contiguous axial images were obtained from the base of the skull through the vertex without intravenous  contrast. COMPARISON:  None. FINDINGS: Brain: There is atrophy and chronic small vessel disease changes. No acute intracranial abnormality. Specifically, no hemorrhage, hydrocephalus, mass lesion, acute infarction, or significant intracranial injury. Vascular: No hyperdense vessel or unexpected calcification. Skull: No acute calvarial abnormality. Sinuses/Orbits: Visualized paranasal sinuses and mastoids clear. Orbital soft tissues unremarkable. Other: None IMPRESSION: Atrophy, chronic microvascular disease. No acute intracranial abnormality. Electronically Signed   By: Rolm Baptise M.D.   On: 07/24/2018 15:17   Ct Angio Neck W And/or Wo Contrast  Result Date: 07/24/2018 CLINICAL DATA:  Ataxia with stroke suspected EXAM: CT ANGIOGRAPHY HEAD AND NECK TECHNIQUE: Multidetector CT imaging of the head and neck was performed using the standard protocol during bolus administration of intravenous contrast. Multiplanar CT image reconstructions and MIPs were obtained to evaluate the vascular anatomy. Carotid stenosis measurements (when applicable) are obtained utilizing NASCET criteria, using the distal internal carotid diameter as the denominator. CONTRAST:  109mL OMNIPAQUE IOHEXOL 350 MG/ML SOLN COMPARISON:  Noncontrast head CT earlier today FINDINGS: CTA NECK FINDINGS Aortic arch: Atherosclerotic plaque.  Three vessel branching. Right carotid system: Bulky calcified plaque at the bifurcation with high-grade proximal ICA narrowing difficult to quantify due to degree of calcified plaque blooming. Downstream vessel is smooth and patent. Left carotid system: Mixed density plaque along the common carotid artery. Patulous bifurcation and proximal ICA after endarterectomy. There is narrowing at the presumed proximal anastomosis which measures 40% (narrowed segment compared to the more proximal vessel given the immediate subsequent bifurcation and postoperative patulous appearance). Vertebral arteries: Proximal subclavian  atherosclerosis. Calcified plaque at the left vertebral origin with moderate narrowing. The downstream vessels are smooth and widely patent. Skeleton: Negative Other neck: No emergent finding Upper chest: There are layering pleural effusions. Borderline interlobular septal thickening with airway thickening. There are enlarged mediastinal lymph nodes which are partially covered, measuring 17 mm at the left paratracheal station. Review of the MIP images confirms the above findings CTA HEAD FINDINGS Anterior circulation: Suboptimal bolus density. Calcified plaque along the bilateral carotid siphons without evidence of severe narrowing. Posterior circulation: The vertebral and basilar arteries are smooth and widely patent. Negative for branch occlusion or flow limiting stenosis. Negative for aneurysm. Venous sinuses: Patent Anatomic variants: None significant Delayed phase: Not obtained in the emergent setting Review of the MIP images confirms the above findings IMPRESSION: 1. No emergent finding. 2. Severe atheromatous narrowing at the right ICA origin to the degree that the lumen is not measurable. 3. Left carotid endarterectomy with ~40% stenosis at the proximal  anastomosis. 4. Moderate atheromatous narrowing at the left vertebral origin. 5. Upper thoracic adenopathy that is partially covered. Recommend dedicated chest CT after convalescence. 6. Layering pleural effusions on both sides. Electronically Signed   By: Monte Fantasia M.D.   On: 07/24/2018 16:49   Ct Chest Wo Contrast  Result Date: 07/25/2018 CLINICAL DATA:  Enlarged lymph nodes identified on neck CT. EXAM: CT CHEST WITHOUT CONTRAST TECHNIQUE: Multidetector CT imaging of the chest was performed following the standard protocol without IV contrast. COMPARISON:  Radiograph 07/24/2018 neck CT 07/24/2018 FINDINGS: Cardiovascular: Coronary artery calcification and aortic atherosclerotic calcification. Mediastinum/Nodes: Small bilateral axillary lymph nodes  measure up to 12 mm short axis in the RIGHT axilla. No supraclavicular adenopathy. Multiple mildly enlarged paratracheal lymph nodes are present. Enlarged 16 mm RIGHT lower paratracheal lymph node. Similar 16 mm LEFT lower paratracheal lymph node. Subcarinal nodes measured 18 mm short axis. Lungs/Pleura: Moderate layering RIGHT effusion and small LEFT effusion. There is bibasilar passive atelectasis. Interlobular septal thickening within the lungs consistent with interstitial edema. Upper Abdomen: Limited view of the liver, kidneys, pancreas are unremarkable. Normal adrenal glands. Musculoskeletal: No aggressive osseous lesion. IMPRESSION: 1. Multiple enlarged medium-size mediastinal lymph nodes. Differential would include reactive adenopathy versus lymphoproliferative process (e.g. CLL). Favor lymphoproliferative process. Recommend clinical correlation and consider hematology oncology consult. At minimum recommend follow-up CT chest abdomen pelvis with contrast in 1 month. Include CT abdomen pelvis to evaluate for abdominal/pelvic lymphadenopathy. 2. Bilateral pleural effusions with interstitial edema consistent with congestive heart failure. 3. Coronary artery calcification and Aortic Atherosclerosis (ICD10-I70.0). Electronically Signed   By: Suzy Bouchard M.D.   On: 07/25/2018 15:45   Mr Brain Wo Contrast  Result Date: 07/25/2018 CLINICAL DATA:  Acute neurological of an yesterday with altered mental status, slurred speech and balance disturbance. EXAM: MRI HEAD WITHOUT CONTRAST TECHNIQUE: Multiplanar, multiecho pulse sequences of the brain and surrounding structures were obtained without intravenous contrast. COMPARISON:  CT studies done yesterday. FINDINGS: Brain: Diffusion imaging does not show any acute or subacute infarction. No abnormality is seen affecting the pons or cerebellum. There are mild to moderate chronic small-vessel ischemic changes of the cerebral hemispheric deep and subcortical white  matter. No large vessel territory infarction. No mass lesion, hemorrhage, hydrocephalus or extra-axial collection. Vascular: Major vessels at the base of the brain show flow. Skull and upper cervical spine: Negative. Benign appearing heterogeneity of the clivus. Sinuses/Orbits: Clear/normal Other: None IMPRESSION: No acute or subacute finding by MRI. Mild to moderate chronic small-vessel ischemic changes of the cerebral hemispheric white matter. Electronically Signed   By: Nelson Chimes M.D.   On: 07/25/2018 08:23   Dg Chest Port 1 View  Result Date: 07/27/2018 CLINICAL DATA:  Status post PICC line placement. EXAM: PORTABLE CHEST 1 VIEW COMPARISON:  PA and lateral chest 07/26/2018. FINDINGS: Tip of new PICC projects in the lower superior vena cava. Pulmonary edema seen on the comparison examination has resolved. There small bilateral pleural effusions and basilar atelectasis. Atherosclerosis and cardiomegaly noted. IMPRESSION: Tip of right PICC projects in the lower superior vena cava. Resolved pulmonary edema. Small bilateral pleural effusions basilar atelectasis. Electronically Signed   By: Inge Rise M.D.   On: 07/27/2018 13:19   Dg Chest Portable 1 View  Result Date: 07/24/2018 CLINICAL DATA:  Altered mental status today. EXAM: PORTABLE CHEST 1 VIEW COMPARISON:  Single-view of the chest 07/21/2018. PA and lateral chest 10/17/2005. FINDINGS: Cardiomegaly, pulmonary edema and small bilateral pleural effusions appear unchanged. Atherosclerosis is noted. IMPRESSION:  No change in congestive heart failure with associated small bilateral pleural effusions. Electronically Signed   By: Inge Rise M.D.   On: 07/24/2018 16:27   Dg Chest Portable 1 View  Result Date: 07/21/2018 CLINICAL DATA:  Dyspnea EXAM: PORTABLE CHEST 1 VIEW COMPARISON:  10/17/2004 FINDINGS: The heart size is enlarged. There are prominent interstitial lung markings bilaterally. There are small bilateral pleural effusions. There is  adjacent compressive presumed atelectasis. There is no pneumothorax. No acute osseous abnormality. IMPRESSION: Congestive heart failure with small bilateral pleural effusions. Electronically Signed   By: Constance Holster M.D.   On: 07/21/2018 18:23   Mr Cardiac Morphology W Wo Contrast  Result Date: 07/30/2018 CLINICAL DATA:  Ischemic cardiomyopathy EXAM: CARDIAC MRI TECHNIQUE: The patient was scanned on a 1.5 Tesla GE magnet. A dedicated cardiac coil was used. Functional imaging was done using Fiesta sequences. 2,3, and 4 chamber views were done to assess for RWMA's. Modified Simpson's rule using a short axis stack was used to calculate an ejection fraction on a dedicated work Conservation officer, nature. The patient received 8 cc of Multihance. After 10 minutes inversion recovery sequences were used to assess for infiltration and scar tissue. CONTRAST:  8 cc Gadavist FINDINGS: Small to moderate right pleural effusion, small left pleural effusion. Small pericardial effusion. The left ventricle is mildly dilated with normal wall thickness, EF 23%. Akinesis of the mid to apical anterior/anteroseptal/inferoseptal walls, the apical inferior and lateral walls, and the true apex. No LV apical thrombus noted. Mildly dilated right ventricle, EF 32%. Severe left atrial enlargement. Moderate right atrial enlargement. Mild-moderate mitral regurgitation. Trileaflet aortic valve with trivial regurgitation, no stenosis. Mild-moderate tricuspid regurgitation. Delayed enhancement images: 76-99% wall thickness late gadolinium enhancement (LGE) in the mid anteroseptal and mid inferoseptal wall segments. 76-99% wall thickness LGE at the apical cap and the apical inferior wall segment. Measurements: LVEDV 246 mL LVSV 57 mL LVEF 23% RVEDV 203 mL RVSV 65 mL RVEF 32% IMPRESSION: 1. Mildly dilated LV with EF 23%, wall motion abnormalities as noted above. 2. Mildly dilated RV with EF 32%, moderately depressed systolic function. 3.  Delayed enhancement presents a mixed picture. There are extensive wall motion abnormalities. The delayed enhancement images suggest that the mid anteroseptal/inferoseptal and the apical inferior and true apex wall segments would be unlikely to improve with revascularization. However, the other affected wall segments appear viable. [name] Electronically Signed   By: Loralie Champagne M.D.   On: 07/30/2018 16:34   Dg Hips Bilat With Pelvis 3-4 Views  Result Date: 08/02/2018 CLINICAL DATA:  Fall, soreness, initial encounter. EXAM: DG HIP (WITH OR WITHOUT PELVIS) 3-4V BILAT COMPARISON:  None. FINDINGS: No acute osseous or joint abnormality. IMPRESSION: No acute osseous or joint abnormality. Electronically Signed   By: Lorin Picket M.D.   On: 08/02/2018 12:43   Vas US Carotid  Result Date: 07/25/2018 Carotid Arterial Duplex Study Indications:       TIA. Risk Factors:      Diabetes, coronary artery disease. Comparison Study:  Previous study: 02/09/2014 Performing Technologist: Abram Sander RVS  Examination Guidelines: A complete evaluation includes B-mode imaging, spectral Doppler, color Doppler, and power Doppler as needed of all accessible portions of each vessel. Bilateral testing is considered an integral part of a complete examination. Limited examinations for reoccurring indications may be performed as noted.  Right Carotid Findings: +----------+--------+--------+--------+----------------------+--------+           PSV cm/sEDV cm/sStenosisDescribe  Comments +----------+--------+--------+--------+----------------------+--------+ CCA Prox  38      7               calcific and irregular         +----------+--------+--------+--------+----------------------+--------+ CCA Distal39      9               calcific and irregular         +----------+--------+--------+--------+----------------------+--------+ ICA Prox  316     62      60-79%  calcific and irregular          +----------+--------+--------+--------+----------------------+--------+ ICA Mid   72      11                                             +----------+--------+--------+--------+----------------------+--------+ ICA Distal62      22                                             +----------+--------+--------+--------+----------------------+--------+ ECA       97      12                                             +----------+--------+--------+--------+----------------------+--------+ +----------+--------+-------+--------+-------------------+           PSV cm/sEDV cmsDescribeArm Pressure (mmHG) +----------+--------+-------+--------+-------------------+ [id number]                                         +----------+--------+-------+--------+-------------------+ +---------+--------+--+--------+--+---------+ VertebralPSV cm/s49EDV cm/s11Antegrade +---------+--------+--+--------+--+---------+  Left Carotid Findings: +----------+--------+--------+--------+-------------------------+--------+           PSV cm/sEDV cm/sStenosisDescribe                 Comments +----------+--------+--------+--------+-------------------------+--------+ CCA Prox  47      7               heterogenous                      +----------+--------+--------+--------+-------------------------+--------+ CCA Distal111     9               heterogenous and calcific         +----------+--------+--------+--------+-------------------------+--------+ ICA Prox  56      17      1-39%   heterogenous                      +----------+--------+--------+--------+-------------------------+--------+ ICA Distal64      21                                                +----------+--------+--------+--------+-------------------------+--------+ ECA       137                                                       +----------+--------+--------+--------+-------------------------+--------+  +----------+--------+--------+--------+-------------------+  SubclavianPSV cm/sEDV cm/sDescribeArm Pressure (mmHG) +----------+--------+--------+--------+-------------------+           62                                          +----------+--------+--------+--------+-------------------+ +---------+--------+--+--------+-+---------+ VertebralPSV cm/s36EDV cm/s7Antegrade +---------+--------+--+--------+-+---------+  Summary: Right Carotid: Velocities in the right ICA are consistent with a 60-79%                stenosis. 60-79% stenosis based off plaque morphology. Left Carotid: Velocities in the left ICA are consistent with a 1-39% stenosis. Vertebrals: Bilateral vertebral arteries demonstrate antegrade flow. *See table(s) above for measurements and observations.  Electronically signed by Monica Martinez MD on 07/25/2018 at 3:07:12 PM.    Final    [place] Ekg Site Rite  Result Date: 07/27/2018 If Site Rite image not attached, placement could not be confirmed due to current cardiac rhythm.  [place] Ekg Site Rite  Result Date: 07/27/2018 If Site Rite image not attached, placement could not be confirmed due to current cardiac rhythm.     Subjective: Patient seen this morning.  Denied complaints.  Insistent on going home.  Clearly states that he does not plan to have carotid surgery during this hospitalization.  No chest pain, dyspnea, palpitations, dizziness or lightheadedness reported.  Discharge Exam:  Vitals:   08/02/18 0400 08/02/18 0627 08/02/18 0744 08/02/18 1552  BP:  128/87 113/65 115/72  Pulse:  82 95 65  Resp: (!) 21  (!) 26 (!) 21  Temp:   97.6 F (36.4 C) 97.8 F (36.6 C)  TempSrc:   Oral Oral  SpO2:   97%   Weight:      Height:        General exam: Pleasant elderly male, moderately built and nourished seen ambulating comfortably in the room. Respiratory system: Clear to auscultation.  No increased work of breathing.   Cardiovascular system: S1 & S2 heard, irregularly  irregular. No JVD, murmurs, rubs, gallops or clicks. No pedal edema.  Telemetry personally reviewed: A. fib with controlled ventricular rate.   Gastrointestinal system: Abdomen is nondistended, soft and nontender. No organomegaly or masses felt. Normal bowel sounds heard.  Central nervous system: Alert and oriented. No focal neurological deficits. Extremities: Symmetric 5 x 5 power. Skin: No rashes, lesions or ulcers Psychiatry: Judgement and insight appear normal. Mood & affect appropriate.     The results of significant diagnostics from this hospitalization (including imaging, microbiology, ancillary and laboratory) are listed below for reference.     Microbiology: Recent Results (from the past 240 hour(s))  SARS Coronavirus 2 (CEPHEID - Performed in [place] hospital lab), Hosp Order     Status: None   Collection Time: 07/24/18  4:48 PM  Result Value Ref Range Status   SARS Coronavirus 2 NEGATIVE NEGATIVE Final    Comment: (NOTE) If result is NEGATIVE SARS-CoV-2 target nucleic acids are NOT DETECTED. The SARS-CoV-2 RNA is generally detectable in upper and lower  respiratory specimens during the acute phase of infection. The lowest  concentration of SARS-CoV-2 viral copies this assay can detect is 250  copies / mL. A negative result does not preclude SARS-CoV-2 infection  and should not be used as the sole basis for treatment or other  patient management decisions.  A negative result may occur with  improper specimen collection / handling, submission of specimen other  than nasopharyngeal swab, presence of  viral mutation(s) within the  areas targeted by this assay, and inadequate number of viral copies  (<250 copies / mL). A negative result must be combined with clinical  observations, patient history, and epidemiological information. If result is POSITIVE SARS-CoV-2 target nucleic acids are DETECTED. The SARS-CoV-2 RNA is generally detectable in upper and lower  respiratory  specimens dur ing the acute phase of infection.  Positive  results are indicative of active infection with SARS-CoV-2.  Clinical  correlation with patient history and other diagnostic information is  necessary to determine patient infection status.  Positive results do  not rule out bacterial infection or co-infection with other viruses. If result is PRESUMPTIVE POSTIVE SARS-CoV-2 nucleic acids MAY BE PRESENT.   A presumptive positive result was obtained on the submitted specimen  and confirmed on repeat testing.  While 2019 novel coronavirus  (SARS-CoV-2) nucleic acids may be present in the submitted sample  additional confirmatory testing may be necessary for epidemiological  and / or clinical management purposes  to differentiate between  SARS-CoV-2 and other Sarbecovirus currently known to infect humans.  If clinically indicated additional testing with an alternate test  methodology [phone]) is advised. The SARS-CoV-2 RNA is generally  detectable in upper and lower respiratory sp ecimens during the acute  phase of infection. The expected result is Negative. Fact Sheet for Patients:  StrictlyIdeas.no Fact Sheet for Healthcare Providers: BankingDealers.co.za This test is not yet approved or cleared by the [place] FDA and has been authorized for detection and/or diagnosis of SARS-CoV-2 by FDA under an Emergency Use Authorization (EUA).  This EUA will remain in effect (meaning this test can be used) for the duration of the COVID-19 declaration under Section 564(b)(1) of the Act, 21 U.S.C. section 360bbb-3(b)(1), unless the authorization is terminated or revoked sooner. Performed at [facility], [address], [place], [place] [address]      Labs: CBC: Recent Labs  Lab 07/28/18 [phone]  07/29/18 1642 07/30/18 0553 07/31/18 0332 08/01/18 0517 08/02/18 0500  WBC 10.9*  --   --  9.7 9.3 8.2 8.3  HGB 14.1   < > 16.0 13.9 13.9  13.3 12.9*  HCT 43.9   < > 47.0 43.3 43.7 41.7 40.4  MCV 82.8  --   --  83.8 83.7 83.7 84.2  PLT 443*  --   --  454* 431* 389 362   < > = values in this interval not displayed.   Basic Metabolic Panel: Recent Labs  Lab 07/28/18 0333 07/29/18 0451  07/29/18 1642 07/30/18 0553 07/31/18 0332 08/01/18 0517 08/02/18 0500  NA 138 137   < > 139 137 140 139 137  K 3.0* 3.9   < > 3.7 3.8 3.8 4.2 4.2  CL 92* 97*  --   --  99 104 103 104  CO2 31 30  --   --  27 26 26 25   GLUCOSE 167* 161*  --   --  152* 124* 164* 153*  BUN 29* 26*  --   --  23 18 19 20   CREATININE 1.16 1.17  --   --  1.08 0.96 0.91 0.99  CALCIUM 8.7* 8.9  --   --  9.2 9.0 9.2 9.1  MG 2.1  --   --   --   --   --   --   --    < > = values in this interval not displayed.   BNP (last 3 results) Recent Labs    07/21/18  1754  BNP 2,433.0*   Cardiac Enzymes: Recent Labs  Lab 07/26/18 1706 07/27/18 0021  TROPONINI 0.36* 0.35*   CBG: Recent Labs  Lab 08/01/18 1548 08/01/18 2154 08/02/18 0626 08/02/18 1105 08/02/18 1555  GLUCAP 219* 120* 149* 161* 148*   Urinalysis    Component Value Date/Time   COLORURINE YELLOW 07/24/2018 1451   APPEARANCEUR CLEAR 07/24/2018 1451   LABSPEC 1.009 07/24/2018 1451   PHURINE 6.0 07/24/2018 1451   [place] 07/24/2018 1451   [place] 07/24/2018 1451   [place] 07/24/2018 1451   KETONESUR NEGATIVE 07/24/2018 1451   PROTEINUR NEGATIVE 07/24/2018 1451   NITRITE NEGATIVE 07/24/2018 [place] 07/24/2018 1451      Time coordinating discharge: 60 minutes  SIGNED:  Vernell Leep, MD, FACP, [facility]. [facility]  To contact the attending provider between 7A-7P or the covering provider during after hours 7P-7A, please log into the web site [email or web address] and access using universal [place] password for that web site. If you do not have the password, please call the hospital operator.

## 2018-08-02 NOTE — Discharge Instructions (Signed)

## 2018-08-02 NOTE — Care Management (Signed)
CM acknowledges consult for lovenox.  CM submitted benefit check for both Lovenox and entresto.  CM discussed the ease of pt utilizing [facility] for Lovenox and Entresto - pt is in agreement.  CM confirmed with TOC pharamcy that inventory is avialable today for lovenox fill.  Pt informed CM that he would like to get his next fill of Entresto from [facility] in [place].  Pt will be given free Entresto 30 day supply card.  Pt informed CM that his PCP is with [facility] in [place] at [address] - CM contacted office and requested appt for INR draw on this Thursday - pt informed of appt specifics and information placed on AVS

## 2018-08-02 NOTE — Progress Notes (Signed)
Inpatient Diabetes Program Recommendations  AACE/ADA: New Consensus Statement on Inpatient Glycemic Control (2015)  Target Ranges:  Prepandial:   less than 140 mg/dL      Peak postprandial:   less than 180 mg/dL (1-2 hours)      Critically ill patients:  140 - 180 mg/dL   Results for Oscar [name], Oscar [name] (MRN [id number]) as of 08/02/2018 09:30  Ref. Range 08/01/2018 05:18 08/01/2018 11:39 08/01/2018 15:48 08/01/2018 21:54 08/02/2018 06:26  Glucose-Capillary Latest Ref Range: 70 - 99 mg/dL 149 (H) 285 (H) 219 (H) 120 (H) 149 (H)    Review of Glycemic Control  Diabetes history: DM 2 Outpatient Diabetes medications: Glipizide 10 mg bid, metformin 500 mg bid, Tradjenta 5 mg daily Current orders for Inpatient glycemic control: Novolog 0-15 units tid, Novolog 0-5 units qhs, Novolog 4 units tid meal coverage  A1c 6.9% on 5/31  Inpatient Diabetes Program Recommendations:    Glucose trends increase after meals, Consider increasing meal coverage to Novolog 6 units tid meal coverage if patient consumes at least 50% of meals.  Thanks,  Tama Headings RN, MSN, BC-ADM Inpatient Diabetes Coordinator Team Pager [phone] (8a-5p)

## 2018-08-02 NOTE — Progress Notes (Signed)
Patient ID: Oscar [name], male   DOB: 12/15/1940, 78 y.o.   MRN: [id number]    Advanced Heart Failure Rounding Note   Subjective:    Oscar [name] he wants to go home today. Denies CP or SOB. No further TIA symptoms. Co-ox 59%   Refusing CEA.   On heparin/coumadin with INR 1.5.   Objective:   Weight Range:  Vital Signs:   Temp:  [97.6 F (36.4 C)-97.8 F (36.6 C)] 97.6 F (36.4 C) (06/08 0744) Pulse Rate:  [76-95] 95 (06/08 0744) Resp:  [17-26] 26 (06/08 0744) BP: (107-128)/(65-87) 113/65 (06/08 0744) SpO2:  [95 %-97 %] 97 % (06/08 0744) Last BM Date: 08/01/18  Weight change: Filed Weights   07/24/18 2110 07/27/18 0500 07/28/18 0649  Weight: 77 kg 73.3 kg 69.5 kg    Intake/Output:   Intake/Output Summary (Last 24 hours) at 08/02/2018 0913 Last data filed at 08/02/2018 0746 Gross per 24 hour  Intake 1392.67 ml  Output 1525 ml  Net -132.33 ml     Physical Exam: General:  Elderly. Sitting in chair. No resp difficulty HEENT: normal Neck: supple. no JVD. Carotids 2+ bilat; no bruits. No lymphadenopathy or thryomegaly appreciated. Cor: PMI nondisplaced. Irregular rate & rhythm. No rubs, gallops or murmurs. Lungs: clear Abdomen: soft, nontender, nondistended. No hepatosplenomegaly. No bruits or masses. Good bowel sounds. Extremities: no cyanosis, clubbing, rash, edema Neuro: alert & orientedx3, cranial nerves grossly intact. moves all 4 extremities w/o difficulty. Affect pleasant   Telemetry: AF 80-90s Personally reviewed   Labs: Basic Metabolic Panel: Recent Labs  Lab 07/28/18 0333 07/29/18 0451  07/29/18 1642 07/30/18 0553 07/31/18 0332 08/01/18 0517 08/02/18 0500  NA 138 137   < > 139 137 140 139 137  K 3.0* 3.9   < > 3.7 3.8 3.8 4.2 4.2  CL 92* 97*  --   --  99 104 103 104  CO2 31 30  --   --  27 26 26 25   GLUCOSE 167* 161*  --   --  152* 124* 164* 153*  BUN 29* 26*  --   --  23 18 19 20   CREATININE 1.16 1.17  --   --  1.08 0.96 0.91 0.99  CALCIUM 8.7*  8.9  --   --  9.2 9.0 9.2 9.1  MG 2.1  --   --   --   --   --   --   --    < > = values in this interval not displayed.    Liver Function Tests: No results for input(s): AST, ALT, ALKPHOS, BILITOT, PROT, ALBUMIN in the last 168 hours. No results for input(s): LIPASE, AMYLASE in the last 168 hours. No results for input(s): AMMONIA in the last 168 hours.  CBC: Recent Labs  Lab 07/28/18 0333  07/29/18 1642 07/30/18 0553 07/31/18 0332 08/01/18 0517 08/02/18 0500  WBC 10.9*  --   --  9.7 9.3 8.2 8.3  HGB 14.1   < > 16.0 13.9 13.9 13.3 12.9*  HCT 43.9   < > 47.0 43.3 43.7 41.7 40.4  MCV 82.8  --   --  83.8 83.7 83.7 84.2  PLT 443*  --   --  454* 431* 389 362   < > = values in this interval not displayed.    Cardiac Enzymes: Recent Labs  Lab 07/26/18 1008 07/26/18 1229 07/26/18 1706 07/27/18 0021  TROPONINI 0.08* 0.23* 0.36* 0.35*    BNP: BNP (last 3 results) Recent Labs  07/21/18 1754  BNP 2,433.0*    ProBNP (last 3 results) No results for input(s): PROBNP in the last 8760 hours.    Other results:  Imaging: No results found.   Medications:     Scheduled Medications: . aspirin EC  81 mg Oral Daily  . carvedilol  3.125 mg Oral BID WC  . digoxin  0.125 mg Oral Daily  . enoxaparin (LOVENOX) injection  1 mg/kg Subcutaneous Q12H  . ezetimibe  10 mg Oral Daily  . fenofibrate  160 mg Oral Daily  . furosemide  40 mg Oral Daily  . insulin aspart  0-15 Units Subcutaneous TID WC  . insulin aspart  0-5 Units Subcutaneous QHS  . insulin aspart  4 Units Subcutaneous TID WC  . multivitamin with minerals  1 tablet Oral Daily  . omega-3 acid ethyl esters  2 g Oral BID  . pravastatin  80 mg Oral Daily  . sacubitril-valsartan  1 tablet Oral BID  . sodium chloride flush  10-40 mL Intracatheter Q12H  . sodium chloride flush  3 mL Intravenous Q12H  . sodium chloride flush  3 mL Intravenous Q12H  . spironolactone  25 mg Oral Daily    Infusions: . sodium chloride       PRN Medications: sodium chloride, acetaminophen **OR** acetaminophen (TYLENOL) oral liquid 160 mg/5 mL **OR** acetaminophen, albuterol, calcium carbonate, hydrALAZINE, ipratropium-albuterol, ondansetron (ZOFRAN) IV, senna-docusate, sodium chloride flush, sodium chloride flush   Assessment/Plan:   1. Acute systolic HF -> cardiogenic shock  - Echo this admit EF 20-25% with RWMA - Echo 7/19 EF 65% - Cath on 6/5: Left dominant system  LM 50-60% LAD 99% pLCx 60% RCA non-dominant occluded - Patient refusing CABG eval. Dr. Haroldine Laws reviewed films with Dr. Burt Knack and feels LAD is more like a CTO. cMRI ordered to look for viability => This was a mixed picture showing areas that may improve with revascularization and areas that likely would not improve with revascularization in the LAD distribution. Could consider high-risk PCI with atherectomy and Impella support. We discussed PCI with patient several times.  Unless there would be significant recovery with minimal risk, he is really not interested.  He wants to pursue medical management for now.  - Volume status improved. CVP not elevated. Now back on Lasix 40 mg daily for home. Reinforced need for daily weights and reviewed use of sliding scale diuretics. - Co-ox adequate at 59%, continue digoxin. - Continue current digoxin, spiro, Entresto 24/26. No b-blocker for now.   2. CAD - plan is for medical therapy as above - continue ASA, statin.  - Once warfarin back on board can stop ASA  3. Acute hypoxic respiratory failure - Improved with diuresis  4. Chronic AF - rate ok - on heparin with INR < 2.0, warfarin has been restarted, INR 1.5.  With recent TIA, will need bridging to therapeutic INR. We discussed lovenox shots and he is willing to do this. We will get him trained. Discussed with PharmD team who will work with his local pharmacy to arrange   5. TIA with severe right carotid stenosis - VVS has seen. Plane for CEA at end of this month  with lovenox bridge. We discussed the risk of having a potentially devastating  CVA in the interim. He is aware and of this risk and but wants to wait.   6. DM2 - per primary team    Addendum:  Patient had a witnessed mechanical fall while ambulating room. Was bale to  break fall with hid hands and IV pole. Fell on R hip. No head trauma. No outward evidence of injury/deformity. Will get plain films. D/w Dr. Lavella Lemons by phone.   Length of Stay: 8   Glori Bickers MD 08/02/2018, 9:13 AM  Advanced Heart Failure Team Pager [phone] (M-F; [place])  Please contact [facility] for night-coverage after hours (4p -7a ) and weekends on [email or web address]

## 2018-08-06 ENCOUNTER — Telehealth: Payer: Self-pay | Admitting: *Deleted

## 2018-08-06 NOTE — Telephone Encounter (Signed)
I have made several attempts to call patient to scheduled surgery. NO answer no voice mail at the number he gave me [phone]/ I called wife's cell and was told wrong number.

## 2018-08-07 ENCOUNTER — Inpatient Hospital Stay (HOSPITAL_COMMUNITY)
Admission: EM | Admit: 2018-08-07 | Discharge: 2018-08-12 | DRG: 038 | Disposition: A | Discharge status: Home / Self Care | Payer: Medicare Other | Attending: Internal Medicine | Admitting: Internal Medicine

## 2018-08-07 ENCOUNTER — Emergency Department (HOSPITAL_COMMUNITY): Payer: Medicare Other

## 2018-08-07 ENCOUNTER — Observation Stay (HOSPITAL_COMMUNITY): Payer: Medicare Other

## 2018-08-07 ENCOUNTER — Other Ambulatory Visit: Payer: Self-pay

## 2018-08-07 ENCOUNTER — Encounter (HOSPITAL_COMMUNITY): Payer: Self-pay | Admitting: Emergency Medicine

## 2018-08-07 ENCOUNTER — Encounter (HOSPITAL_COMMUNITY): Payer: TRICARE For Life (TFL)

## 2018-08-07 DIAGNOSIS — I4819 Other persistent atrial fibrillation: Secondary | ICD-10-CM

## 2018-08-07 DIAGNOSIS — N179 Acute kidney failure, unspecified: Secondary | ICD-10-CM | POA: Diagnosis not present

## 2018-08-07 DIAGNOSIS — I5022 Chronic systolic (congestive) heart failure: Secondary | ICD-10-CM | POA: Diagnosis present

## 2018-08-07 DIAGNOSIS — Z7982 Long term (current) use of aspirin: Secondary | ICD-10-CM

## 2018-08-07 DIAGNOSIS — E7800 Pure hypercholesterolemia, unspecified: Secondary | ICD-10-CM | POA: Diagnosis present

## 2018-08-07 DIAGNOSIS — I4891 Unspecified atrial fibrillation: Secondary | ICD-10-CM | POA: Diagnosis present

## 2018-08-07 DIAGNOSIS — J449 Chronic obstructive pulmonary disease, unspecified: Secondary | ICD-10-CM

## 2018-08-07 DIAGNOSIS — I2510 Atherosclerotic heart disease of native coronary artery without angina pectoris: Secondary | ICD-10-CM | POA: Diagnosis present

## 2018-08-07 DIAGNOSIS — J9611 Chronic respiratory failure with hypoxia: Secondary | ICD-10-CM | POA: Diagnosis present

## 2018-08-07 DIAGNOSIS — I4821 Permanent atrial fibrillation: Secondary | ICD-10-CM | POA: Diagnosis present

## 2018-08-07 DIAGNOSIS — Z9981 Dependence on supplemental oxygen: Secondary | ICD-10-CM

## 2018-08-07 DIAGNOSIS — Z7984 Long term (current) use of oral hypoglycemic drugs: Secondary | ICD-10-CM

## 2018-08-07 DIAGNOSIS — D649 Anemia, unspecified: Secondary | ICD-10-CM | POA: Diagnosis present

## 2018-08-07 DIAGNOSIS — E11649 Type 2 diabetes mellitus with hypoglycemia without coma: Secondary | ICD-10-CM | POA: Diagnosis present

## 2018-08-07 DIAGNOSIS — D509 Iron deficiency anemia, unspecified: Secondary | ICD-10-CM | POA: Diagnosis present

## 2018-08-07 DIAGNOSIS — I959 Hypotension, unspecified: Secondary | ICD-10-CM | POA: Diagnosis present

## 2018-08-07 DIAGNOSIS — Z7901 Long term (current) use of anticoagulants: Secondary | ICD-10-CM

## 2018-08-07 DIAGNOSIS — I6521 Occlusion and stenosis of right carotid artery: Secondary | ICD-10-CM | POA: Diagnosis not present

## 2018-08-07 DIAGNOSIS — E785 Hyperlipidemia, unspecified: Secondary | ICD-10-CM | POA: Diagnosis present

## 2018-08-07 DIAGNOSIS — G459 Transient cerebral ischemic attack, unspecified: Secondary | ICD-10-CM | POA: Diagnosis not present

## 2018-08-07 DIAGNOSIS — N289 Disorder of kidney and ureter, unspecified: Secondary | ICD-10-CM

## 2018-08-07 DIAGNOSIS — Z8673 Personal history of transient ischemic attack (TIA), and cerebral infarction without residual deficits: Secondary | ICD-10-CM

## 2018-08-07 DIAGNOSIS — I110 Hypertensive heart disease with heart failure: Secondary | ICD-10-CM | POA: Diagnosis present

## 2018-08-07 DIAGNOSIS — Z1159 Encounter for screening for other viral diseases: Secondary | ICD-10-CM

## 2018-08-07 DIAGNOSIS — Z8249 Family history of ischemic heart disease and other diseases of the circulatory system: Secondary | ICD-10-CM

## 2018-08-07 DIAGNOSIS — Z8349 Family history of other endocrine, nutritional and metabolic diseases: Secondary | ICD-10-CM

## 2018-08-07 DIAGNOSIS — Z87891 Personal history of nicotine dependence: Secondary | ICD-10-CM

## 2018-08-07 DIAGNOSIS — E871 Hypo-osmolality and hyponatremia: Secondary | ICD-10-CM | POA: Diagnosis present

## 2018-08-07 DIAGNOSIS — E162 Hypoglycemia, unspecified: Secondary | ICD-10-CM

## 2018-08-07 DIAGNOSIS — Z833 Family history of diabetes mellitus: Secondary | ICD-10-CM

## 2018-08-07 LAB — APTT: aPTT: 44 seconds — ABNORMAL HIGH (ref 24–36)

## 2018-08-07 LAB — I-STAT CHEM 8, ED
BUN: 37 mg/dL — ABNORMAL HIGH (ref 8–23)
Calcium, Ion: 1.26 mmol/L (ref 1.15–1.40)
Chloride: 100 mmol/L (ref 98–111)
Creatinine, Ser: 1.3 mg/dL — ABNORMAL HIGH (ref 0.61–1.24)
Glucose, Bld: 132 mg/dL — ABNORMAL HIGH (ref 70–99)
HCT: 39 % (ref 39.0–52.0)
Hemoglobin: 13.3 g/dL (ref 13.0–17.0)
Potassium: 4.6 mmol/L (ref 3.5–5.1)
Sodium: 132 mmol/L — ABNORMAL LOW (ref 135–145)
TCO2: 24 mmol/L (ref 22–32)

## 2018-08-07 LAB — COMPREHENSIVE METABOLIC PANEL
ALT: 38 U/L (ref 0–44)
AST: 52 U/L — ABNORMAL HIGH (ref 15–41)
Albumin: 3.4 g/dL — ABNORMAL LOW (ref 3.5–5.0)
Alkaline Phosphatase: 49 U/L (ref 38–126)
Anion gap: 10 (ref 5–15)
BUN: 39 mg/dL — ABNORMAL HIGH (ref 8–23)
CO2: 22 mmol/L (ref 22–32)
Calcium: 9.7 mg/dL (ref 8.9–10.3)
Chloride: 100 mmol/L (ref 98–111)
Creatinine, Ser: 1.32 mg/dL — ABNORMAL HIGH (ref 0.61–1.24)
GFR calc Af Amer: 59 mL/min — ABNORMAL LOW (ref >60)
GFR calc non Af Amer: 51 mL/min — ABNORMAL LOW (ref >60)
Glucose, Bld: 135 mg/dL — ABNORMAL HIGH (ref 70–99)
Potassium: 4.5 mmol/L (ref 3.5–5.1)
Sodium: 132 mmol/L — ABNORMAL LOW (ref 135–145)
Total Bilirubin: 0.9 mg/dL (ref 0.3–1.2)
Total Protein: 5.9 g/dL — ABNORMAL LOW (ref 6.5–8.1)

## 2018-08-07 LAB — CBC
HCT: 37.3 % — ABNORMAL LOW (ref 39.0–52.0)
Hemoglobin: 11.8 g/dL — ABNORMAL LOW (ref 13.0–17.0)
MCH: 27.2 pg (ref 26.0–34.0)
MCHC: 31.6 g/dL (ref 30.0–36.0)
MCV: 85.9 fL (ref 80.0–100.0)
Platelets: 350 10*3/uL (ref 150–400)
RBC: 4.34 MIL/uL (ref 4.22–5.81)
RDW: 17.5 % — ABNORMAL HIGH (ref 11.5–15.5)
WBC: 9.5 10*3/uL (ref 4.0–10.5)
nRBC: 0 % (ref 0.0–0.2)

## 2018-08-07 LAB — DIFFERENTIAL
Abs Immature Granulocytes: 0.04 10*3/uL (ref 0.00–0.07)
Basophils Absolute: 0 10*3/uL (ref 0.0–0.1)
Basophils Relative: 0 %
Eosinophils Absolute: 0.1 10*3/uL (ref 0.0–0.5)
Eosinophils Relative: 1 %
Immature Granulocytes: 0 %
Lymphocytes Relative: 11 %
Lymphs Abs: 1.1 10*3/uL (ref 0.7–4.0)
Monocytes Absolute: 0.9 10*3/uL (ref 0.1–1.0)
Monocytes Relative: 10 %
Neutro Abs: 7.4 10*3/uL (ref 1.7–7.7)
Neutrophils Relative %: 78 %

## 2018-08-07 LAB — CBG MONITORING, ED: Glucose-Capillary: 119 mg/dL — ABNORMAL HIGH (ref 70–99)

## 2018-08-07 LAB — GLUCOSE, CAPILLARY
Glucose-Capillary: 48 mg/dL — ABNORMAL LOW (ref 70–99)
Glucose-Capillary: 75 mg/dL (ref 70–99)
Glucose-Capillary: 110 mg/dL — ABNORMAL HIGH (ref 70–99)
Glucose-Capillary: 71 mg/dL (ref 70–99)

## 2018-08-07 LAB — DIGOXIN LEVEL: Digoxin Level: 0.5 ng/mL — ABNORMAL LOW (ref 0.8–2.0)

## 2018-08-07 LAB — SARS CORONAVIRUS 2: SARS Coronavirus 2: NOT DETECTED

## 2018-08-07 LAB — PROTIME-INR
INR: 2.6 — ABNORMAL HIGH (ref 0.8–1.2)
Prothrombin Time: 27.7 s — ABNORMAL HIGH (ref 11.4–15.2)

## 2018-08-07 MED ORDER — SPIRONOLACTONE 25 MG PO TABS
25.0000 mg | ORAL_TABLET | Freq: Every day | ORAL | Status: DC
  Administered 2018-08-07 – 2018-08-12 (×5): 25 mg via ORAL
  Filled 2018-08-07 (×5): qty 1

## 2018-08-07 MED ORDER — ACETAMINOPHEN 650 MG RE SUPP: 650.0000 mg | Freq: Four times a day (QID) | RECTAL | Status: DC | PRN

## 2018-08-07 MED ORDER — INSULIN ASPART 100 UNIT/ML ~~LOC~~ SOLN
0.0000 [IU] | Freq: Three times a day (TID) | SUBCUTANEOUS | Status: DC
  Administered 2018-08-09: 2 [IU] via SUBCUTANEOUS
  Administered 2018-08-09 (×2): 3 [IU] via SUBCUTANEOUS
  Administered 2018-08-10: 5 [IU] via SUBCUTANEOUS
  Administered 2018-08-10: 2 [IU] via SUBCUTANEOUS
  Administered 2018-08-11: 1 [IU] via SUBCUTANEOUS
  Administered 2018-08-12: 5 [IU] via SUBCUTANEOUS

## 2018-08-07 MED ORDER — SACUBITRIL-VALSARTAN 24-26 MG PO TABS
1.0000 | ORAL_TABLET | Freq: Two times a day (BID) | ORAL | Status: DC
  Administered 2018-08-07 – 2018-08-12 (×11): 1 via ORAL
  Filled 2018-08-07 (×13): qty 1

## 2018-08-07 MED ORDER — CARVEDILOL 3.125 MG PO TABS
3.1250 mg | ORAL_TABLET | Freq: Two times a day (BID) | ORAL | Status: DC
  Administered 2018-08-07: 3.125 mg via ORAL
  Filled 2018-08-07: qty 1

## 2018-08-07 MED ORDER — ONDANSETRON HCL 4 MG PO TABS: 4.0000 mg | ORAL_TABLET | Freq: Four times a day (QID) | ORAL | Status: DC | PRN

## 2018-08-07 MED ORDER — PRAVASTATIN SODIUM 40 MG PO TABS
80.0000 mg | ORAL_TABLET | Freq: Every day | ORAL | Status: DC
  Administered 2018-08-07 – 2018-08-12 (×6): 80 mg via ORAL
  Filled 2018-08-07 (×7): qty 2

## 2018-08-07 MED ORDER — ALBUTEROL SULFATE (2.5 MG/3ML) 0.083% IN NEBU: 2.5000 mg | INHALATION_SOLUTION | RESPIRATORY_TRACT | Status: DC | PRN

## 2018-08-07 MED ORDER — ONDANSETRON HCL 4 MG/2ML IJ SOLN: 4.0000 mg | Freq: Four times a day (QID) | INTRAMUSCULAR | Status: DC | PRN

## 2018-08-07 MED ORDER — SODIUM CHLORIDE 0.9% FLUSH
3.0000 mL | Freq: Once | INTRAVENOUS | Status: AC
  Administered 2018-08-07: 3 mL via INTRAVENOUS

## 2018-08-07 MED ORDER — FUROSEMIDE 40 MG PO TABS
40.0000 mg | ORAL_TABLET | Freq: Every day | ORAL | Status: DC
  Administered 2018-08-09 – 2018-08-12 (×4): 40 mg via ORAL
  Filled 2018-08-07 (×4): qty 1

## 2018-08-07 MED ORDER — ASPIRIN EC 81 MG PO TBEC
81.0000 mg | DELAYED_RELEASE_TABLET | Freq: Every day | ORAL | Status: DC
  Administered 2018-08-07 – 2018-08-12 (×6): 81 mg via ORAL
  Filled 2018-08-07 (×6): qty 1

## 2018-08-07 MED ORDER — EZETIMIBE 10 MG PO TABS
10.0000 mg | ORAL_TABLET | Freq: Every day | ORAL | Status: DC
  Administered 2018-08-07 – 2018-08-12 (×6): 10 mg via ORAL
  Filled 2018-08-07 (×6): qty 1

## 2018-08-07 MED ORDER — INSULIN ASPART 100 UNIT/ML ~~LOC~~ SOLN
0.0000 [IU] | Freq: Every day | SUBCUTANEOUS | Status: DC
  Administered 2018-08-08: 2 [IU] via SUBCUTANEOUS
  Administered 2018-08-09: 0 [IU] via SUBCUTANEOUS
  Administered 2018-08-10: 2 [IU] via SUBCUTANEOUS
  Administered 2018-08-11: 3 [IU] via SUBCUTANEOUS

## 2018-08-07 MED ORDER — FENOFIBRATE 160 MG PO TABS
160.0000 mg | ORAL_TABLET | Freq: Every day | ORAL | Status: DC
  Administered 2018-08-07 – 2018-08-12 (×6): 160 mg via ORAL
  Filled 2018-08-07 (×6): qty 1

## 2018-08-07 MED ORDER — ACETAMINOPHEN 325 MG PO TABS: 650.0000 mg | ORAL_TABLET | Freq: Four times a day (QID) | ORAL | Status: DC | PRN

## 2018-08-07 MED ORDER — DEXTROSE 5 % IV SOLN
Freq: Once | INTRAVENOUS | Status: AC
  Administered 2018-08-07: 14:00:00 via INTRAVENOUS

## 2018-08-07 MED ORDER — SODIUM CHLORIDE 0.9% FLUSH
3.0000 mL | Freq: Two times a day (BID) | INTRAVENOUS | Status: DC
  Administered 2018-08-07 – 2018-08-12 (×9): 3 mL via INTRAVENOUS

## 2018-08-07 MED ORDER — DIGOXIN 125 MCG PO TABS
0.1250 mg | ORAL_TABLET | Freq: Every day | ORAL | Status: DC
  Administered 2018-08-07 – 2018-08-12 (×6): 0.125 mg via ORAL
  Filled 2018-08-07 (×6): qty 1

## 2018-08-07 NOTE — ED Triage Notes (Signed)
Pt BIB [facility] for a CODE STROKE. EMS reports the pt was LKW at 1330 on 08/06/2018. EMS reports the family reported slurring of his speech and left sided weakness that started yesterday at 1330. EMS reports slurring of his speech and left sided weakness as well as hypoglycemia @ 55. EMS reports LVO+, left sided weakness, but all other symptoms have resolved after the administration of D10. EMS reports bilateral 18g IV's in the AC's. EMS reports 12 lead unremarkable and vital signs stable.  Pt reports he did notice some slurring of his speech but denies any weakness at this time.

## 2018-08-07 NOTE — ED Notes (Signed)
Pt daughter Brayton Layman [phone], pt wife Kalman Shan [phone]

## 2018-08-07 NOTE — ED Notes (Signed)
Attempted report and Loma Sousa the Network engineer advised the RN would call back because she was in a contact room.

## 2018-08-07 NOTE — Consult Note (Signed)
Neurology Consultation Reason for Consult: Left-sided weakness Referring Physician: Jeanell Sparrow, D  CC: Left-sided weakness  History is obtained from:patient  HPI: Oscar [name] is a 78 y.o. male with a recent admission for TIA during which carotid endarterectomy was recommended.  The patient was hesitant to pursue that at this time, and the plan was to do this later in June.  Unfortunately, this morning he awoke and found himself after he awoke to develop left-sided weakness.  He states that this lasted for about 30 minutes  The patient has not noticed it, his wife reports some difficulty with walking since 1:30 PM yesterday.  LKW: Unclear, possible difficulty with gait since 1:30 PM yesterday tpa given?: no, improving symptoms, okay W 1:30 PM yesterday    ROS: A 14 point ROS was performed and is negative except as noted in the HPI.  Past Medical History:  Diagnosis Date  . Anemia   . Atrial fibrillation ([place])    permanent  . Cardiomyopathy    EF 35% in the past--Improved to 55% echo 2/10. Moderately severe mitral regurgitation in the past, now improved,  . Carotid artery stenosis   . CHF (congestive heart failure) ([place]) 2002  . COPD (chronic obstructive pulmonary disease) ([place])   . Coronary artery disease    non obstructive  . Diabetes mellitus   . Dyslipidemia   . History of tobacco abuse   . Nephrolithiasis   . Tremors of nervous system      Family History  Problem Relation Age of Onset  . Coronary artery disease Mother   . Diabetes Mother   . Heart disease Mother        Before age 81 and  CHF  . Hypertension Mother   . Other Mother        varicose veins  . Heart attack Mother   . Heart disease Sister   . Hypertension Sister   . Heart attack Sister   . Heart disease Brother   . Hyperlipidemia Brother   . Hypertension Brother   . Other Brother        varicose veins  . Heart attack Brother   . Heart disease Son 68       Heart Disease before age 61- Open hear  surgery  . Deep vein thrombosis Son   . Heart attack Son   . Coronary artery disease Other   . Diabetes Daughter   . Hypertension Daughter   . Other Daughter        varicose veins     Social History:  reports that he quit smoking about 18 years ago. His smoking use included cigarettes. He has quit using smokeless tobacco. He reports that he does not drink alcohol or use drugs.   Exam: Current vital signs: BP (!) 103/56   Pulse 74   Temp 98.7 F (37.1 C) (Oral)   Resp (!) 26   Ht 5\' 7"  (1.702 m)   Wt 70 kg   SpO2 96%   BMI 24.17 kg/m  Vital signs in last 24 hours: Temp:  [98.7 F (37.1 C)] 98.7 F (37.1 C) (06/13 0818) Pulse Rate:  [52-88] 74 (06/13 1000) Resp:  [15-26] 26 (06/13 1000) BP: (103-132)/(50-79) 103/56 (06/13 1000) SpO2:  [96 %-98 %] 96 % (06/13 1000) Weight:  [70 kg] 70 kg (06/13 0836)   Physical Exam  Constitutional: Appears well-developed and well-nourished.  Psych: Affect appropriate to situation Eyes: No scleral injection HENT: No OP obstrucion Head: Normocephalic.  Cardiovascular:  Normal rate and regular rhythm.  Respiratory: Effort normal, non-labored breathing GI: Soft.  No distension. There is no tenderness.  Skin: WDI  Neuro: Mental Status: Patient is awake, alert, oriented to person, place, month, year, and situation. Patient is able to give a clear and coherent history. No signs of aphasia or neglect Cranial Nerves: II: Visual Fields are full. Pupils are equal, round, and reactive to light.   III,IV, VI: EOMI without ptosis or diploplia.  V: Facial sensation is symmetric to temperature VII: Facial movement is symmetric.  VIII: hearing is intact to voice X: Uvula elevates symmetrically XI: Shoulder shrug is symmetric. XII: tongue is midline without atrophy or fasciculations.  Motor: Tone is normal. Bulk is normal. 5/5 strength was present in all four extremities.  Sensory: Sensation is symmetric to light touch and temperature in  the arms and legs. Cerebellar: Mild tremor on finger-nose-finger bilaterally     I have reviewed labs in epic and the results pertinent to this consultation are: Sodium 132, creatinine 1.3  I have reviewed the images obtained: CT head-unremarkable  Impression: 78 year old male with known symptomatic carotid stenosis who had transient left-sided weakness again this morning.  With recurrent episodes, I would favor discussion with vascular surgery for consideration of treatment.  I do not think he needs a full repeat evaluation since he just recently had a TIA work-up with LDL 55, A1c 6.9, echo with EF 25 to 30%, right carotid with 60 to 79% stenosis  Though consistent with TIA clinically, I do think an MRI to assess for stroke would be prudent.  Recommendations: 1) vascular surgery consultation 2) continue Coumadin, aspirin 3) MRI brain  4) stroke team to follow   Roland Rack, MD [facility] [phone]  If 7pm- 7am, please page neurology on call as listed in [place].

## 2018-08-07 NOTE — Progress Notes (Signed)
ANTICOAGULATION CONSULT NOTE - Initial Consult  Pharmacy Consult for heparin Indication: atrial fibrillation   Patient Measurements: Height: 5\' 7"  (170.2 cm) Weight: 154 lb 5.2 oz (70 kg) IBW/kg (Calculated) : 66.1 Heparin Dosing Weight: 70 kg  Vital Signs: Temp: 98.7 F (37.1 C) (06/13 0818) Temp Source: Oral (06/13 0818) BP: 108/55 (06/13 0945) Pulse Rate: 52 (06/13 0945)  Labs: Recent Labs    08/07/18 0806 08/07/18 0809  HGB 11.8* 13.3  HCT 37.3* 39.0  PLT 350  --   APTT 44*  --   LABPROT 27.7*  --   INR 2.6*  --   CREATININE 1.32* 1.30*     Medical History: Past Medical History:  Diagnosis Date  . Anemia   . Atrial fibrillation ([place])    permanent  . Cardiomyopathy    EF 35% in the past--Improved to 55% echo 2/10. Moderately severe mitral regurgitation in the past, now improved,  . Carotid artery stenosis   . CHF (congestive heart failure) ([place]) 2002  . COPD (chronic obstructive pulmonary disease) ([place])   . Coronary artery disease    non obstructive  . Diabetes mellitus   . Dyslipidemia   . History of tobacco abuse   . Nephrolithiasis   . Tremors of nervous system      Assessment: 78 yo male admitted with slurred speech and L-sided weakness. He is on warfarin prior to admission for AFib. He has had a prior TIA in the past. INR on admit is 2.6. Appears that patient is going to proceed with neurovascular procedure.   Goal of Therapy:  INR 2-3 Monitor platelets by anticoagulation protocol: Yes    Plan:  -Will start heparin when INR < 2 -Daily INR   Harvel Quale 08/07/2018,10:10 AM

## 2018-08-07 NOTE — ED Notes (Signed)
ED TO INPATIENT HANDOFF REPORT  ED Nurse Name and Phone #: Nicki Reaper [place] Name/Age/Gender Oscar [name] 78 y.o. male Room/Bed: RESUSC/RESUSC  Code Status   Code Status: Full Code  Home/SNF/Other Home Patient oriented to: self, place, time and situation Is this baseline? Yes   Triage Complete: Triage complete  Chief Complaint Code stroke  Triage Note Pt BIB [facility] for a CODE STROKE. EMS reports the pt was LKW at 1330 on 08/06/2018. EMS reports the family reported slurring of his speech and left sided weakness that started yesterday at 1330. EMS reports slurring of his speech and left sided weakness as well as hypoglycemia @ 55. EMS reports LVO+, left sided weakness, but all other symptoms have resolved after the administration of D10. EMS reports bilateral 18g IV's in the AC's. EMS reports 12 lead unremarkable and vital signs stable.  Pt reports he did notice some slurring of his speech but denies any weakness at this time.    Allergies Allergies  Allergen Reactions  . Penicillins Hives    Blisters on hands  . Statins     Joint and muscle pain and low energy.    Level of Care/Admitting Diagnosis ED Disposition    ED Disposition Condition Comment   Admit  Hospital Area: [facility] [[id number]]  Level of Care: Progressive [102]  I expect the patient will be discharged within 24 hours: No (not a candidate for 5C-Observation unit)  Covid Evaluation: Screening Protocol (No Symptoms)  Diagnosis: Carotid artery stenosis, symptomatic, right [233007]  Admitting Physician: Norval Morton [[id number]]  Attending Physician: Norval Morton [[id number]]  PT Class (Do Not Modify): Observation [104]  PT Acc Code (Do Not Modify): Observation [10022]       B Medical/Surgery History Past Medical History:  Diagnosis Date  . Anemia   . Atrial fibrillation ([place])    permanent  . Cardiomyopathy    EF 35% in the past--Improved to 55% echo 2/10.  Moderately severe mitral regurgitation in the past, now improved,  . Carotid artery stenosis   . CHF (congestive heart failure) ([place]) 2002  . COPD (chronic obstructive pulmonary disease) ([place])   . Coronary artery disease    non obstructive  . Diabetes mellitus   . Dyslipidemia   . History of tobacco abuse   . Nephrolithiasis   . Tremors of nervous system    Past Surgical History:  Procedure Laterality Date  . CAROTID ENDARTERECTOMY Left 10-21-05   cea  . left carotid endarterectomy    . RIGHT/LEFT HEART CATH AND CORONARY ANGIOGRAPHY N/A 07/29/2018   Procedure: RIGHT/LEFT HEART CATH AND CORONARY ANGIOGRAPHY;  Surgeon: Jolaine Artist, MD;  Location: [facility];  Service: Cardiovascular;  Laterality: N/A;  . TONSILLECTOMY       A IV Location/Drains/Wounds Patient Lines/Drains/Airways Status   Active Line/Drains/Airways    Name:   Placement date:   Placement time:   Site:   Days:   Peripheral IV 08/07/18 Left Antecubital   08/07/18    -    Antecubital   less than 1   Peripheral IV 08/07/18 Right Antecubital   08/07/18    -    Antecubital   less than 1          Intake/Output Last 24 hours  Intake/Output Summary (Last 24 hours) at 08/07/2018 1019 Last data filed at 08/07/2018 0803 Gross per 24 hour  Intake 20 ml  Output 0 ml  Net 20 ml  Labs/Imaging Results for orders placed or performed during the hospital encounter of 08/07/18 (from the past 48 hour(s))  Protime-INR     Status: Abnormal   Collection Time: 08/07/18  8:06 AM  Result Value Ref Range   Prothrombin Time 27.7 (H) 11.4 - 15.2 seconds   INR 2.6 (H) 0.8 - 1.2    Comment: (NOTE) INR goal varies based on device and disease states. Performed at [facility], [place] [address], [place], [place] [address]   APTT     Status: Abnormal   Collection Time: 08/07/18  8:06 AM  Result Value Ref Range   aPTT 44 (H) 24 - 36 seconds    Comment:        IF BASELINE aPTT IS ELEVATED, SUGGEST PATIENT RISK  ASSESSMENT BE USED TO DETERMINE APPROPRIATE ANTICOAGULANT THERAPY. Performed at [facility], [place] [address], [place], [place] [address]   CBC     Status: Abnormal   Collection Time: 08/07/18  8:06 AM  Result Value Ref Range   WBC 9.5 4.0 - 10.5 K/uL   RBC 4.34 4.22 - 5.81 MIL/uL   Hemoglobin 11.8 (L) 13.0 - 17.0 g/dL   HCT 37.3 (L) 39.0 - 52.0 %   MCV 85.9 80.0 - 100.0 fL   MCH 27.2 26.0 - 34.0 pg   MCHC 31.6 30.0 - 36.0 g/dL   RDW 17.5 (H) 11.5 - 15.5 %   Platelets 350 150 - 400 K/uL   nRBC 0.0 0.0 - 0.2 %    Comment: Performed at [facility], [place] [address], [place], [place] [address]  Differential     Status: None   Collection Time: 08/07/18  8:06 AM  Result Value Ref Range   Neutrophils Relative % 78 %   Neutro Abs 7.4 1.7 - 7.7 K/uL   Lymphocytes Relative 11 %   Lymphs Abs 1.1 0.7 - 4.0 K/uL   Monocytes Relative 10 %   Monocytes Absolute 0.9 0.1 - 1.0 K/uL   Eosinophils Relative 1 %   Eosinophils Absolute 0.1 0.0 - 0.5 K/uL   Basophils Relative 0 %   Basophils Absolute 0.0 0.0 - 0.1 K/uL   Immature Granulocytes 0 %   Abs Immature Granulocytes 0.04 0.00 - 0.07 K/uL    Comment: Performed at [facility] [address], [place], [place] [address]  Comprehensive metabolic panel     Status: Abnormal   Collection Time: 08/07/18  8:06 AM  Result Value Ref Range   Sodium 132 (L) 135 - 145 mmol/L   Potassium 4.5 3.5 - 5.1 mmol/L   Chloride 100 98 - 111 mmol/L   CO2 22 22 - 32 mmol/L   Glucose, Bld 135 (H) 70 - 99 mg/dL   BUN 39 (H) 8 - 23 mg/dL   Creatinine, Ser 1.32 (H) 0.61 - 1.24 mg/dL   Calcium 9.7 8.9 - 10.3 mg/dL   Total Protein 5.9 (L) 6.5 - 8.1 g/dL   Albumin 3.4 (L) 3.5 - 5.0 g/dL   AST 52 (H) 15 - 41 U/L   ALT 38 0 - 44 U/L   Alkaline Phosphatase 49 38 - 126 U/L   Total Bilirubin 0.9 0.3 - 1.2 mg/dL   GFR calc non Af Amer 51 (L) >60 mL/min   GFR calc Af Amer 59 (L) >60 mL/min   Anion gap 10 5 - 15    Comment: Performed at [facility], [place],  [place] [address]  I-stat chem 8, ED     Status: Abnormal   Collection Time: 08/07/18  8:09 AM  Result Value Ref Range   Sodium 132 (L) 135 - 145 mmol/L   Potassium 4.6 3.5 - 5.1 mmol/L   Chloride 100 98 - 111 mmol/L   BUN 37 (H) 8 - 23 mg/dL   Creatinine, Ser 1.30 (H) 0.61 - 1.24 mg/dL   Glucose, Bld 132 (H) 70 - 99 mg/dL   Calcium, Ion 1.26 1.15 - 1.40 mmol/L   TCO2 24 22 - 32 mmol/L   Hemoglobin 13.3 13.0 - 17.0 g/dL   HCT 39.0 39.0 - 52.0 %  CBG monitoring, ED     Status: Abnormal   Collection Time: 08/07/18  8:21 AM  Result Value Ref Range   Glucose-Capillary 119 (H) 70 - 99 mg/dL   Comment 1 Notify RN    Comment 2 Document in Chart   Digoxin level     Status: Abnormal   Collection Time: 08/07/18  9:01 AM  Result Value Ref Range   Digoxin Level 0.5 (L) 0.8 - 2.0 ng/mL    Comment: Performed at [facility], [place] [address], [place], [place] [address]   Ct Head Code Stroke Wo Contrast  Result Date: 08/07/2018 CLINICAL DATA:  Code stroke.  Left-sided weakness. EXAM: CT HEAD WITHOUT CONTRAST TECHNIQUE: Contiguous axial images were obtained from the base of the skull through the vertex without intravenous contrast. COMPARISON:  Head CT 07/24/2018 and MRI 07/25/2018 FINDINGS: Brain: There is no evidence of acute infarct, intracranial hemorrhage, mass, midline shift, or extra-axial fluid collection. Mild cerebral atrophy is again noted. Cerebral white matter hypodensities are unchanged and nonspecific but compatible with mild chronic small vessel ischemic disease. Vascular: Calcified atherosclerosis at the skull base. Likely iatrogenic venous gas in the left cavernous sinus and scattered small scalp veins bilaterally. Skull: No fracture or focal osseous lesion. Sinuses/Orbits: Visualized paranasal sinuses and mastoid air cells are clear. Orbits are unremarkable. Other: None. ASPECTS [facility] Stroke Program Early CT Score) - Ganglionic level infarction  (caudate, lentiform nuclei, internal capsule, insula, M1-M3 cortex): 7 - Supraganglionic infarction (M4-M6 cortex): 3 Total score (0-10 with 10 being normal): 10 IMPRESSION: 1. No evidence of acute intracranial abnormality. 2. ASPECTS is 10. 3. Mild chronic small vessel ischemic disease. These results were communicated to Dr. Leonel Ramsay at 8:19 am on 08/07/2018 by text page via the [facility] messaging system. Electronically Signed   By: Logan Bores M.D.   On: 08/07/2018 08:19    Pending Labs Unresulted Labs (From admission, onward)    Start     Ordered   08/07/18 0900  SARS Coronavirus 2  Once,   R     08/07/18 0900          Vitals/Pain Today's Vitals   08/07/18 0915 08/07/18 0934 08/07/18 0945 08/07/18 1000  BP: 119/79  (!) 108/55 (!) 103/56  Pulse: 85  (!) 52 74  Resp: 19  19 (!) 26  Temp:      TempSrc:      SpO2: 98%  98% 96%  Weight:      Height:      PainSc:  0-No pain      Isolation Precautions No active isolations  Medications Medications  carvedilol (COREG) tablet 3.125 mg (has no administration in time range)  digoxin (LANOXIN) tablet 0.125 mg (has no administration in time range)  sodium chloride flush (NS) 0.9 % injection 3 mL (has no administration  in time range)  ezetimibe (ZETIA) tablet 10 mg (has no administration in time range)  acetaminophen (TYLENOL) tablet 650 mg (has no administration in time range)    Or  acetaminophen (TYLENOL) suppository 650 mg (has no administration in time range)  ondansetron (ZOFRAN) tablet 4 mg (has no administration in time range)    Or  ondansetron (ZOFRAN) injection 4 mg (has no administration in time range)  albuterol (PROVENTIL) (2.5 MG/3ML) 0.083% nebulizer solution 2.5 mg (has no administration in time range)  aspirin EC tablet 81 mg (has no administration in time range)  insulin aspart (novoLOG) injection 0-9 Units (has no administration in time range)  insulin aspart (novoLOG) injection 0-5 Units (has no administration in  time range)  sodium chloride flush (NS) 0.9 % injection 3 mL (3 mLs Intravenous Given 08/07/18 0810)    Mobility walks Low fall risk   Focused Assessments Neuro Assessment Handoff:  Swallow screen pass? Yes  Cardiac Rhythm: Normal sinus rhythm NIH Stroke Scale ( + Modified Stroke Scale Criteria)  Interval: Initial Level of Consciousness (1a.)   : Alert, keenly responsive LOC Questions (1b. )   +: Answers both questions correctly LOC Commands (1c. )   + : Performs both tasks correctly Best Gaze (2. )  +: Normal Visual (3. )  +: No visual loss Facial Palsy (4. )    : Normal symmetrical movements Motor Arm, Left (5a. )   +: No drift Motor Arm, Right (5b. )   +: No drift Motor Leg, Left (6a. )   +: No drift Motor Leg, Right (6b. )   +: No drift Limb Ataxia (7. ): Absent Sensory (8. )   +: Normal, no sensory loss Best Language (9. )   +: No aphasia Dysarthria (10. ): Normal Extinction/Inattention (11.)   +: No Abnormality Modified SS Total  +: 0 Complete NIHSS TOTAL: 0 Last date known well: 08/06/18 Last time known well: 1330 Neuro Assessment: Exceptions to WDL Neuro Checks:   Initial (08/07/18 0808)  Last Documented NIHSS Modified Score: 0 (08/07/18 0981) Has TPA been given? No If patient is a Neuro Trauma and patient is going to OR before floor call report to [facility] nurse: [phone] or [phone]     R Recommendations: See Admitting Provider Note  Report given to:   Additional Notes:

## 2018-08-07 NOTE — Code Documentation (Signed)
Code Stroke activated at 0758 -- arrived at 0803. LT sided weakness and slurred speech, along with BS 55. Symptoms resolved at receiving D10 with EMS. Cleared for CT, NIH 0. LSN 1300 (yesterday).   Last admission to hospital ( just discharged on 6/8), patient was advised to proceed with RT CAROTID ENDARTERECTOMY.   Plan: [place] admission, Vascular consults, Neuro Checks Q2H.   End Time 0825

## 2018-08-07 NOTE — Consult Note (Signed)
Vascular and Vein Specialist of [facility]  Patient name: Oscar [name] MRN: [id number] DOB: 05/11/1940 Sex: male   REQUESTING PROVIDER:    Hospital service   REASON FOR CONSULT:    TIA  HISTORY OF PRESENT ILLNESS:   Oscar [name] is a 78 y.o. male, who presented to the emergency department earlier today with left-sided weakness and some difficulty walking since yesterday.  The patient was admitted 2 weeks ago for similar symptoms.  He had a MRI which was negative for acute stroke.  A CT angiogram showed a patent left carotid endarterectomy.  He had a high-grade calcified lesion in the right carotid artery.  While in the hospital he underwent cardiac catheterization which showed severe three-vessel disease.  The patient did not want CABG or PCI however was considering carotid surgery but wanted to wait towards the end of the month.   Patient has a history of atrial fibrillation, on Coumad he is a former smoker.  In.  He has congestive heart failure with a recent decline in his ejection fraction.  He is medically managed for hypertension.  He is on a statin for hypercholesterolemia.   PAST MEDICAL HISTORY    Past Medical History:  Diagnosis Date  . Anemia   . Atrial fibrillation ([place])    permanent  . Cardiomyopathy    EF 35% in the past--Improved to 55% echo 2/10. Moderately severe mitral regurgitation in the past, now improved,  . Carotid artery stenosis   . CHF (congestive heart failure) ([place]) 2002  . COPD (chronic obstructive pulmonary disease) ([place])   . Coronary artery disease    non obstructive  . Diabetes mellitus   . Dyslipidemia   . History of tobacco abuse   . Nephrolithiasis   . Tremors of nervous system      FAMILY HISTORY   Family History  Problem Relation Age of Onset  . Coronary artery disease Mother   . Diabetes Mother   . Heart disease Mother        Before age 34 and  CHF  . Hypertension Mother   . Other  Mother        varicose veins  . Heart attack Mother   . Heart disease Sister   . Hypertension Sister   . Heart attack Sister   . Heart disease Brother   . Hyperlipidemia Brother   . Hypertension Brother   . Other Brother        varicose veins  . Heart attack Brother   . Heart disease Son 44       Heart Disease before age 66- Open hear surgery  . Deep vein thrombosis Son   . Heart attack Son   . Coronary artery disease Other   . Diabetes Daughter   . Hypertension Daughter   . Other Daughter        varicose veins    SOCIAL HISTORY:   Social History   Socioeconomic History  . Marital status: Married    Spouse name: Not on file  . Number of children: 5  . Years of education: 47  . Highest education level: Not on file  Occupational History  . Occupation: Retired  Scientific laboratory technician  . Financial resource strain: Not on file  . Food insecurity    Worry: Not on file    Inability: Not on file  . Transportation needs    Medical: Not on file    Non-medical: Not on file  Tobacco Use  .  Smoking status: Former Smoker    Types: Cigarettes    Quit date: 02/25/2000    Years since quitting: 18.4  . Smokeless tobacco: Former Network engineer and Sexual Activity  . Alcohol use: No  . Drug use: No  . Sexual activity: Not on file  Lifestyle  . Physical activity    Days per week: Not on file    Minutes per session: Not on file  . Stress: Not on file  Relationships  . Social Herbalist on phone: Not on file    Gets together: Not on file    Attends religious service: Not on file    Active member of club or organization: Not on file    Attends meetings of clubs or organizations: Not on file    Relationship status: Not on file  . Intimate partner violence    Fear of current or ex partner: Not on file    Emotionally abused: Not on file    Physically abused: Not on file    Forced sexual activity: Not on file  Other Topics Concern  . Not on file  Social History Narrative    Lives at home with his wife.   Right-handed.   3-4 cups caffeine per day.    ALLERGIES:    Allergies  Allergen Reactions  . Penicillins Hives    Blisters on hands  . Statins     Joint and muscle pain and low energy.    CURRENT MEDICATIONS:    Current Facility-Administered Medications  Medication Dose Route Frequency Provider Last Rate Last Dose  . acetaminophen (TYLENOL) tablet 650 mg  650 mg Oral Q6H PRN Norval Morton, MD       Or  . acetaminophen (TYLENOL) suppository 650 mg  650 mg Rectal Q6H PRN Smith, Rondell A, MD      . albuterol (PROVENTIL) (2.5 MG/3ML) 0.083% nebulizer solution 2.5 mg  2.5 mg Nebulization Q4H PRN Fuller Plan A, MD      . aspirin EC tablet 81 mg  81 mg Oral Daily Smith, Rondell A, MD   81 mg at 08/07/18 1140  . carvedilol (COREG) tablet 3.125 mg  3.125 mg Oral BID WC Smith, Rondell A, MD      . digoxin (LANOXIN) tablet 0.125 mg  0.125 mg Oral Daily Smith, Rondell A, MD   0.125 mg at 08/07/18 1139  . ezetimibe (ZETIA) tablet 10 mg  10 mg Oral Daily Tamala Julian, Rondell A, MD   10 mg at 08/07/18 1140  . fenofibrate tablet 160 mg  160 mg Oral Daily Tamala Julian, Rondell A, MD   160 mg at 08/07/18 1409  . [START ON 08/08/2018] furosemide (LASIX) tablet 40 mg  40 mg Oral Daily Smith, Rondell A, MD      . insulin aspart (novoLOG) injection 0-5 Units  0-5 Units Subcutaneous QHS Smith, Rondell A, MD      . insulin aspart (novoLOG) injection 0-9 Units  0-9 Units Subcutaneous TID WC Smith, Rondell A, MD      . ondansetron (ZOFRAN) tablet 4 mg  4 mg Oral Q6H PRN Fuller Plan A, MD       Or  . ondansetron (ZOFRAN) injection 4 mg  4 mg Intravenous Q6H PRN Smith, Rondell A, MD      . pravastatin (PRAVACHOL) tablet 80 mg  80 mg Oral Daily Smith, Rondell A, MD   80 mg at 08/07/18 1409  . sacubitril-valsartan (ENTRESTO) 24-26 mg per tablet  1  tablet Oral BID Tamala Julian, Rondell A, MD      . sodium chloride flush (NS) 0.9 % injection 3 mL  3 mL Intravenous Q12H Smith, Rondell A,  MD   3 mL at 08/07/18 1148  . spironolactone (ALDACTONE) tablet 25 mg  25 mg Oral Daily Fuller Plan A, MD   25 mg at 08/07/18 1409    REVIEW OF SYSTEMS:   [X]  denotes positive finding, [ ]  denotes negative finding Cardiac  Comments:  Chest pain or chest pressure:    Shortness of breath upon exertion:    Short of breath when lying flat:    Irregular heart rhythm:        Vascular    Pain in calf, thigh, or hip brought on by ambulation:    Pain in feet at night that wakes you up from your sleep:     Blood clot in your veins:    Leg swelling:         Pulmonary    Oxygen at home:    Productive cough:     Wheezing:         Neurologic    Sudden weakness in arms or legs:  x   Sudden numbness in arms or legs:  x   Sudden onset of difficulty speaking or slurred speech:    Temporary loss of vision in one eye:     Problems with dizziness:         Gastrointestinal    Blood in stool:      Vomited blood:         Genitourinary    Burning when urinating:     Blood in urine:        Psychiatric    Major depression:         Hematologic    Bleeding problems:    Problems with blood clotting too easily:        Skin    Rashes or ulcers:        Constitutional    Fever or chills:     PHYSICAL EXAM:   Vitals:   08/07/18 0945 08/07/18 1000 08/07/18 1139 08/07/18 1202  BP: (!) 108/55 (!) 103/56  (!) 111/58  Pulse: (!) 52 74 75 (!) 59  Resp: 19 (!) 26  (!) 24  Temp:      TempSrc:      SpO2: 98% 96%  93%  Weight:      Height:        GENERAL: The patient is a well-nourished male, in no acute distress. The vital signs are documented above. CARDIAC: There is a regular rate and rhythm.  VASCULAR: extremities warm and well perfused PULMONARY: Nonlabored respirations ABDOMEN: Soft and non-tender with normal pitched bowel sounds.  MUSCULOSKELETAL: There are no major deformities or cyanosis. NEUROLOGIC: No focal weakness or paresthesias are detected. SKIN: There are no ulcers or  rashes noted. PSYCHIATRIC: The patient has a normal affect.  STUDIES:   I have reviewed the following studies: --CT head:  1. No evidence of acute intracranial abnormality. 2. ASPECTS is 10. 3. Mild chronic small vessel ischemic disease. ASSESSMENT and PLAN   Symptomatic right carotid stenosis: I would like to repeat his carotid duplex to confirm patency of his high-grade carotid stenosis.  He will be need to be transitioned off of his Coumadin.  We will plan for carotid revascularization next week.   Leia Alf, MD, FACS [facility] and Vein Specialists of [facility] [phone] Pager [phone])  [phone] 

## 2018-08-07 NOTE — Progress Notes (Signed)
hHypoglycemic Event  CBG: 48  Treatment: Symptoms: 8 oz juice   Follow-up CBG: Time:1215 CBG Result: 75  Possible Reasons for Event: New admit had not eaten prior to admit  Comments/MD notified: Dr Rejeana Brock

## 2018-08-07 NOTE — H&P (Signed)
History and Physical    Oscar [name] IRS:[id number] DOB: January 01, 1941 DOA: 08/07/2018  Referring MD/NP/PA: Pattricia Boss, MD PCP: No primary care provider on file.  Patient coming from: Home via EMS  Chief Complaint: Slurred speech and left-sided weakness  I have personally briefly reviewed patient's old medical records in [place]   HPI: Oscar [name] is a 78 y.o. male with medical history significant of atrial fibrillation on Coumadin, systolic CHF 00-93%, Lt CEA in 2007, CAD, COPD on 4 L at night, DM type II, and HLD; who presented with complaints of left-sided weakness.  Patient's wife noted some difficulty with them walking yesterday at 1:30 pm.  Patient reports waking up this morning and feeling loopy with slurred speech and left-sided weakness. Upon EMS arrival patient reported patient's initial blood glucose 55.  Patient symptoms reportedly resolved after receiving D10. Patient does not routinely check his blood sugars at home, but does not recall having any significant lows prior. He is on glipizide, Tradjenta, and metformin.  Patient had just been admitted into the hospital from 5/30-6/8, after presenting with suspected TIA found to have occluded right internal carotid artery stenosis, acute hypoxic respiratory failure, and new onset systolic congestive heart failure with EF 25 to 30%.  Patient was diuresed and underwent cardiac catheter which revealed severe three-vessel coronary artery disease.  Patient was evaluated and appears to have declined carotid endarterectomy by vascular surgery as well as CABG or high risk PCI.  ED Course: Patient presented as a code stroke.  Dr. Leonel Ramsay neurology was consulted and initial CT scan of the brain showed no acute abnormalities.  Vital signs relatively within normal limits.  Labs revealed WBC 9.5, hemoglobin 11.8 with elevated RDW, 2, BUN 39, and creatinine 1.32.  Neurology recommended checking MRI of the brain.  Vascular surgery Dr.  Trula Slade had formally consulted.  Review of Systems  Constitutional: Negative for chills and fever.  HENT: Negative for ear discharge and ear pain.   Eyes: Negative for double vision and photophobia.  Respiratory: Negative for cough and shortness of breath.   Cardiovascular: Negative for chest pain and orthopnea.  Gastrointestinal: Negative for abdominal pain, nausea and vomiting.  Genitourinary: Negative for dysuria and frequency.  Musculoskeletal: Negative for falls and myalgias.  Neurological: Positive for speech change and focal weakness. Negative for loss of consciousness.  Psychiatric/Behavioral: Negative for substance abuse.    Past Medical History:  Diagnosis Date  . Anemia   . Atrial fibrillation ([place])    permanent  . Cardiomyopathy    EF 35% in the past--Improved to 55% echo 2/10. Moderately severe mitral regurgitation in the past, now improved,  . Carotid artery stenosis   . CHF (congestive heart failure) ([place]) 2002  . COPD (chronic obstructive pulmonary disease) ([place])   . Coronary artery disease    non obstructive  . Diabetes mellitus   . Dyslipidemia   . History of tobacco abuse   . Nephrolithiasis   . Tremors of nervous system     Past Surgical History:  Procedure Laterality Date  . CAROTID ENDARTERECTOMY Left 10-21-05   cea  . left carotid endarterectomy    . RIGHT/LEFT HEART CATH AND CORONARY ANGIOGRAPHY N/A 07/29/2018   Procedure: RIGHT/LEFT HEART CATH AND CORONARY ANGIOGRAPHY;  Surgeon: Jolaine Artist, MD;  Location: [place] [facility];  Service: Cardiovascular;  Laterality: N/A;  . TONSILLECTOMY       reports that he quit smoking about 18 years ago. His smoking use included  cigarettes. He has quit using smokeless tobacco. He reports that he does not drink alcohol or use drugs.  Allergies  Allergen Reactions  . Penicillins Hives    Blisters on hands  . Statins     Joint and muscle pain and low energy.    Family History  Problem Relation Age of  Onset  . Coronary artery disease Mother   . Diabetes Mother   . Heart disease Mother        Before age 59 and  CHF  . Hypertension Mother   . Other Mother        varicose veins  . Heart attack Mother   . Heart disease Sister   . Hypertension Sister   . Heart attack Sister   . Heart disease Brother   . Hyperlipidemia Brother   . Hypertension Brother   . Other Brother        varicose veins  . Heart attack Brother   . Heart disease Son 27       Heart Disease before age 25- Open hear surgery  . Deep vein thrombosis Son   . Heart attack Son   . Coronary artery disease Other   . Diabetes Daughter   . Hypertension Daughter   . Other Daughter        varicose veins    Prior to Admission medications   Medication Sig Start Date End Date Taking? Authorizing Provider  albuterol (PROAIR HFA) 108 (90 Base) MCG/ACT inhaler Inhale 2 puffs into the lungs every 6 (six) hours as needed for wheezing or shortness of breath. 08/02/18   Hongalgi, Lenis Dickinson, MD  aspirin EC 81 MG EC tablet Take 1 tablet (81 mg total) by mouth daily. 08/03/18   Hongalgi, Lenis Dickinson, MD  carvedilol (COREG) 3.125 MG tablet Take 1 tablet (3.125 mg total) by mouth 2 (two) times daily with a meal. 08/02/18   Hongalgi, Lenis Dickinson, MD  digoxin (LANOXIN) 0.125 MG tablet Take 1 tablet (0.125 mg total) by mouth daily. 08/14/17   Minus Breeding, MD  enoxaparin (LOVENOX) 60 MG/0.6ML injection Inject 0.6 mLs (60 mg total) into the skin every 12 (twelve) hours. 08/02/18   Hongalgi, Lenis Dickinson, MD  ezetimibe (ZETIA) 10 MG tablet Take 10 mg by mouth daily.      [provider]  fenofibrate 160 MG tablet Take 160 mg by mouth daily.    [provider]  furosemide (LASIX) 40 MG tablet Take 1 tablet (40 mg total) by mouth daily. 08/02/18   Hongalgi, Lenis Dickinson, MD  glipiZIDE (GLUCOTROL XL) 10 MG 24 hr tablet TAKE 1 TABLET BY MOUTH TWICE DAILY 05/22/14   [provider]  linagliptin (TRADJENTA) 5 MG TABS tablet Take 5 mg by mouth daily.   05/15/14 07/22/17  [provider]  metFORMIN (GLUCOPHAGE-XR) 500 MG 24 hr tablet Take 500 mg by mouth 2 (two) times daily.     [provider]  Multiple Vitamin (MULTIVITAMIN) tablet Take 1 tablet by mouth daily.    [provider]  Omega-3 Fatty Acids (FISH OIL) 1200 MG CAPS Take by mouth 3 (three) times daily.    [provider]  pravastatin (PRAVACHOL) 40 MG tablet Take 2 tablets by mouth daily. 07/12/18   [provider]  sacubitril-valsartan (ENTRESTO) 24-26 MG Take 1 tablet by mouth 2 (two) times daily. 08/02/18   Hongalgi, Lenis Dickinson, MD  spironolactone (ALDACTONE) 25 MG tablet Take 1 tablet (25 mg total) by mouth daily. 08/03/18  Modena Jansky, MD  warfarin (COUMADIN) 7.5 MG tablet Take 1 tablet (7.5 mg total) by mouth daily. 08/03/18   Modena Jansky, MD    Physical Exam:  Constitutional: Elderly male in NAD, calm, comfortable Vitals:   08/07/18 0836 08/07/18 0845 08/07/18 0900 08/07/18 0915  BP:  112/66 (!) 117/50 119/79  Pulse:  88 86 85  Resp:  17 (!) 22 19  Temp:      TempSrc:      SpO2:  98% 97% 98%  Weight: 70 kg     Height: 5\' 7"  (1.702 m)      Eyes: PERRL, lids and conjunctivae normal ENMT: Mucous membranes are moist. Posterior pharynx clear of any exudate or lesions.  Neck: normal, supple, no masses, no thyromegaly Respiratory: clear to auscultation bilaterally, no wheezing, no crackles. Normal respiratory effort. No accessory muscle use.  Cardiovascular: Irregular irregular, no murmurs / rubs / gallops. No extremity edema. 2+ pedal pulses. No carotid bruits.  Abdomen: no tenderness, no masses palpated. No hepatosplenomegaly. Bowel sounds positive.  Musculoskeletal: no clubbing / cyanosis. No joint deformity upper and lower extremities. Good ROM, no contractures. Normal muscle tone.  Skin: no rashes, lesions, ulcers. No induration Neurologic: CN 2-12 grossly intact. Sensation intact, DTR normal. Strength 5/5 in all 4.   Psychiatric: Normal judgment and insight. Alert and oriented x 3. Normal mood.     Labs on Admission: I have personally reviewed following labs and imaging studies  CBC: Recent Labs  Lab 08/01/18 0517 08/02/18 0500 08/07/18 0806 08/07/18 0809  WBC 8.2 8.3 9.5  --   NEUTROABS  --   --  7.4  --   HGB 13.3 12.9* 11.8* 13.3  HCT 41.7 40.4 37.3* 39.0  MCV 83.7 84.2 85.9  --   PLT 389 362 350  --    Basic Metabolic Panel: Recent Labs  Lab 08/01/18 0517 08/02/18 0500 08/07/18 0809  NA 139 137 132*  K 4.2 4.2 4.6  CL 103 104 100  CO2 26 25  --   GLUCOSE 164* 153* 132*  BUN 19 20 37*  CREATININE 0.91 0.99 1.30*  CALCIUM 9.2 9.1  --    GFR: Estimated Creatinine Clearance: 43.1 mL/min (A) (by C-G formula based on SCr of 1.3 mg/dL (H)). Liver Function Tests: No results for input(s): AST, ALT, ALKPHOS, BILITOT, PROT, ALBUMIN in the last 168 hours. No results for input(s): LIPASE, AMYLASE in the last 168 hours. No results for input(s): AMMONIA in the last 168 hours. Coagulation Profile: Recent Labs  Lab 08/01/18 0517 08/02/18 0500 08/07/18 0806  INR 1.3* 1.5* 2.6*   Cardiac Enzymes: No results for input(s): CKTOTAL, CKMB, CKMBINDEX, TROPONINI in the last 168 hours. BNP (last 3 results) No results for input(s): PROBNP in the last 8760 hours. HbA1C: No results for input(s): HGBA1C in the last 72 hours. CBG: Recent Labs  Lab 08/01/18 2154 08/02/18 0626 08/02/18 1105 08/02/18 1555 08/07/18 0821  GLUCAP 120* 149* 161* 148* 119*   Lipid Profile: No results for input(s): CHOL, HDL, LDLCALC, TRIG, CHOLHDL, LDLDIRECT in the last 72 hours. Thyroid Function Tests: No results for input(s): TSH, T4TOTAL, FREET4, T3FREE, THYROIDAB in the last 72 hours. Anemia Panel: No results for input(s): VITAMINB12, FOLATE, FERRITIN, TIBC, IRON, RETICCTPCT in the last 72 hours. Urine analysis:    Component Value Date/Time   COLORURINE YELLOW 07/24/2018 [place]  07/24/2018 1451   LABSPEC 1.009 07/24/2018 1451   PHURINE 6.0 07/24/2018 1451  GLUCOSEU NEGATIVE 07/24/2018 [place] 07/24/2018 [place] 07/24/2018 [place] 07/24/2018 1451   PROTEINUR NEGATIVE 07/24/2018 1451   NITRITE NEGATIVE 07/24/2018 [place] 07/24/2018 1451   Sepsis Labs: No results found for this or any previous visit (from the past 240 hour(s)).   Radiological Exams on Admission: Ct Head Code Stroke Wo Contrast  Result Date: 08/07/2018 CLINICAL DATA:  Code stroke.  Left-sided weakness. EXAM: CT HEAD WITHOUT CONTRAST TECHNIQUE: Contiguous axial images were obtained from the base of the skull through the vertex without intravenous contrast. COMPARISON:  Head CT 07/24/2018 and MRI 07/25/2018 FINDINGS: Brain: There is no evidence of acute infarct, intracranial hemorrhage, mass, midline shift, or extra-axial fluid collection. Mild cerebral atrophy is again noted. Cerebral white matter hypodensities are unchanged and nonspecific but compatible with mild chronic small vessel ischemic disease. Vascular: Calcified atherosclerosis at the skull base. Likely iatrogenic venous gas in the left cavernous sinus and scattered small scalp veins bilaterally. Skull: No fracture or focal osseous lesion. Sinuses/Orbits: Visualized paranasal sinuses and mastoid air cells are clear. Orbits are unremarkable. Other: None. ASPECTS [facility] Stroke Program Early CT Score) - Ganglionic level infarction (caudate, lentiform nuclei, internal capsule, insula, M1-M3 cortex): 7 - Supraganglionic infarction (M4-M6 cortex): 3 Total score (0-10 with 10 being normal): 10 IMPRESSION: 1. No evidence of acute intracranial abnormality. 2. ASPECTS is 10. 3. Mild chronic small vessel ischemic disease. These results were communicated to Dr. Leonel Ramsay at 8:19 am on 08/07/2018 by text page via the [facility] messaging system. Electronically Signed   By: Logan Bores M.D.    On: 08/07/2018 08:19    EKG: Independently reviewed.  Atrial fibrillation at 78 bpm  Assessment/Plan  Dysarthria and left-sided weakness secondary to TIA /symptomatic right carotid artery stenosis: Acute.  Patient found to have left-sided weakness which may have started yesterday and acute onset of slurred speech this morning.  In addition patient noted to be hypoglycemic with blood sugar 55 and symptoms were reported to have improved after given D10.  CT scan of the brain showed no acute abnormalities.  Suspecting TIA.  Neurology was consulted.  During last hospitalization CT angiogram of the head neck on 5/31 revealed severe right coronary artery stenosis.  Patient had been evaluated by Dr. Sherren Mocha early at that time and had declined intervention.  Previous left carotid artery endarterectomy performed back in 2007.  Dr.  Chaya Jan of vascular surgery was consulted by the ED physician. -Admit to a progressive bed -Neurochecks -Continue aspirin and statin -Check MRI per neurology -Appreciate neurology and vascular surgery consultative services, will follow for further recommendations  Diabetes mellitus type 2 with hypoglycemia: Blood sugars initially noted to be as low as 55 by EMS.  Patient on oral hypoglycemic agents of Tradjenta, metformin, repaglinide, and glipizide. Last hemoglobin A1c noted to be 6.9 on 5/31.  Patient with repeat blood glucose of 45 later during hospitalization -Hypoglycemic protocols  -Give the D5 at 75 mL/h for 500 mL, and then discontinue. -Hold home oral medications -May likely benefit from discontinuing glipizide given lows before and during hospitalization -CBGs q. before meals and at bedtime with sensitive SSI  Acute kidney injury: Baseline creatinine previously had been within normal limits last noted to be 0.99.  Patient presents with creatinine 1.3 with BUN 37.  Elevated BUN to creatinine ratio suggest prerenal cause of symptoms. -Gentle IV fluids of dextrose 5% at 75  mL/h for 500 mL -Recheck BMP  in a.m.   Hyponatremia: Acute. Sodium noted to be 132 on admission.  Suspect secondary to hypovolemia. -Continue to monitor  Systolic congestive heart failure: Last echocardiogram noted EF of 25 to 30% on 5/31.  On physical exam patient does not show any acute signs of being fluid overloaded.  -Strict intake and output -Daily weights -Continue Entresto, beta-blocker  Coronary artery disease: Patient is noted to have severe three-vessel disease on cardiac cath performed on 6/3.  Patient was noted to be high risk for CABG and declined PCI during his last hospitalization. -Continue aspirin and statin  Essential hypertension: Blood pressures appear stable. -Continue Coreg, Entresto, spironolactone -Restart furosemide in a.m. given acute kidney injury  Permanent atrial fibrillation on chronic anticoagulation: Patient on anticoagulation of Coumadin with INR therapeutic at 2.6. -Continue Coumadin -Continue digoxin, Coreg  COPD, : Patient on nasal cannula oxygen 4 L usually at night. -Continue 4 L nasal cannula oxygen at night -Albuterol as needed  Normocytic normochromic anemia: Patient hemoglobin 11.8 with elevated RDW of 17.5.  Elevated RDW suggestive of iron deficiency. -Check iron studies in a.m. -Repeat CBC in a.m.  Dyslipidemia: Last lipid panel performed on 07/25/2018 revealed total cholesterol 95, HDL 17, LDL 55, triglycerides 113. -Continue Zetia, fenofibrate, and pravastatin  DVT prophylaxis: coumadin Code Status: Full Family Communication: Discussed plan of care with the patient's wife and daughter over the phone Disposition Plan: To be determined Consults called: Neurology, vascular surgery Admission status:inpatient  Norval Morton MD [facility] Pager [phone]   If 7PM-7AM, please contact night-coverage [email or web address] Password [email or web address]  08/07/2018, 9:20 AM

## 2018-08-07 NOTE — ED Provider Notes (Addendum)
[facility] EMERGENCY DEPARTMENT Provider Note   CSN: [id number] Arrival date & time: 08/07/18  4034     History   Chief Complaint No chief complaint on file.   HPI Oscar [name] is a 78 y.o. male.     HPI  78 yo male ho recent tia with known right carotid occlusion on coumadin presents today with onset of left sided weakness, difficulty with gait yesterday at 1330.  History from EMS report given from wife.  Patient endorses same.  Review of record with d/c 07/24/18.  EMS reports bs was 55 - bs increased with d10- sxs resolved .  Brief Summary: 78 year old married male, PMH of permanent A. fib on warfarin, COPD on home oxygen 4 L/min at bedtime, chronic systolic CHF with subsequent normalization of EF, nonobstructive CAD, DM 2, HTN, HLD, carotid artery disease status post left CEA in 2007, presented to ED for evaluation of transient episode of AMS, slurred speech and balance difficulty.  He was admitted for suspected TIA and found to have occluded right ICA.  Hospital course complicated by acute new systolic CHF with EF 74-25% and acute respiratory failure with hypoxia.  Cardiology consulted.  Aggressively diuresed when clinically euvolemic.  Underwent cardiac cath that showed severe three-vessel CAD.  Patient declines evaluation for CABG or PCI.  Patient declines carotid surgery this admission.       Past Medical History:  Diagnosis Date  . Anemia   . Atrial fibrillation ([place])    permanent  . Cardiomyopathy    EF 35% in the past--Improved to 55% echo 2/10. Moderately severe mitral regurgitation in the past, now improved,  . Carotid artery stenosis   . CHF (congestive heart failure) ([place]) 2002  . COPD (chronic obstructive pulmonary disease) ([place])   . Coronary artery disease    non obstructive  . Diabetes mellitus   . Dyslipidemia   . History of tobacco abuse   . Nephrolithiasis   . Tremors of nervous system     Patient Active Problem List   Diagnosis  Date Noted  . TIA (transient ischemic attack) 07/24/2018  . History of left-sided carotid endarterectomy 02/27/2015  . Carotid artery stenosis 02/27/2015  . Encounter for postoperative carotid endarterectomy surveillance 02/27/2015  . Aftercare following surgery of the circulatory system, [place] 08/10/2012  . Skin lesion 07/02/2011  . MITRAL REGURGITATION 05/03/2009  . CARDIOMYOPATHY 05/03/2009  . CAROTID ARTERY DISEASE 05/02/2009  . ATRIAL FIBRILLATION 08/29/2008    Past Surgical History:  Procedure Laterality Date  . CAROTID ENDARTERECTOMY Left 10-21-05   cea  . left carotid endarterectomy    . RIGHT/LEFT HEART CATH AND CORONARY ANGIOGRAPHY N/A 07/29/2018   Procedure: RIGHT/LEFT HEART CATH AND CORONARY ANGIOGRAPHY;  Surgeon: Jolaine Artist, MD;  Location: [facility];  Service: Cardiovascular;  Laterality: N/A;  . TONSILLECTOMY          Home Medications    Prior to Admission medications   Medication Sig Start Date End Date Taking? Authorizing Provider  albuterol (PROAIR HFA) 108 (90 Base) MCG/ACT inhaler Inhale 2 puffs into the lungs every 6 (six) hours as needed for wheezing or shortness of breath. 08/02/18   Hongalgi, Lenis Dickinson, MD  aspirin EC 81 MG EC tablet Take 1 tablet (81 mg total) by mouth daily. 08/03/18   Hongalgi, Lenis Dickinson, MD  carvedilol (COREG) 3.125 MG tablet Take 1 tablet (3.125 mg total) by mouth 2 (two) times daily with a meal. 08/02/18   Hongalgi, [place]  D, MD  digoxin (LANOXIN) 0.125 MG tablet Take 1 tablet (0.125 mg total) by mouth daily. 08/14/17   Minus Breeding, MD  enoxaparin (LOVENOX) 60 MG/0.6ML injection Inject 0.6 mLs (60 mg total) into the skin every 12 (twelve) hours. 08/02/18   Hongalgi, Lenis Dickinson, MD  ezetimibe (ZETIA) 10 MG tablet Take 10 mg by mouth daily.      [provider]  fenofibrate 160 MG tablet Take 160 mg by mouth daily.    [provider]  furosemide (LASIX) 40 MG tablet Take 1 tablet (40 mg total) by mouth daily. 08/02/18    Hongalgi, Lenis Dickinson, MD  glipiZIDE (GLUCOTROL XL) 10 MG 24 hr tablet TAKE 1 TABLET BY MOUTH TWICE DAILY 05/22/14   [provider]  linagliptin (TRADJENTA) 5 MG TABS tablet Take 5 mg by mouth daily.  05/15/14 07/22/17  [provider]  metFORMIN (GLUCOPHAGE-XR) 500 MG 24 hr tablet Take 500 mg by mouth 2 (two) times daily.     [provider]  Multiple Vitamin (MULTIVITAMIN) tablet Take 1 tablet by mouth daily.    [provider]  Omega-3 Fatty Acids (FISH OIL) 1200 MG CAPS Take by mouth 3 (three) times daily.    [provider]  pravastatin (PRAVACHOL) 40 MG tablet Take 2 tablets by mouth daily. 07/12/18   [provider]  sacubitril-valsartan (ENTRESTO) 24-26 MG Take 1 tablet by mouth 2 (two) times daily. 08/02/18   Hongalgi, Lenis Dickinson, MD  spironolactone (ALDACTONE) 25 MG tablet Take 1 tablet (25 mg total) by mouth daily. 08/03/18   Hongalgi, Lenis Dickinson, MD  warfarin (COUMADIN) 7.5 MG tablet Take 1 tablet (7.5 mg total) by mouth daily. 08/03/18   Hongalgi, Lenis Dickinson, MD    Family History Family History  Problem Relation Age of Onset  . Coronary artery disease Mother   . Diabetes Mother   . Heart disease Mother        Before age 50 and  CHF  . Hypertension Mother   . Other Mother        varicose veins  . Heart attack Mother   . Heart disease Sister   . Hypertension Sister   . Heart attack Sister   . Heart disease Brother   . Hyperlipidemia Brother   . Hypertension Brother   . Other Brother        varicose veins  . Heart attack Brother   . Heart disease Son 55       Heart Disease before age 12- Open hear surgery  . Deep vein thrombosis Son   . Heart attack Son   . Coronary artery disease Other   . Diabetes Daughter   . Hypertension Daughter   . Other Daughter        varicose veins    Social History Social History   Tobacco Use  . Smoking status: Former Smoker    Types: Cigarettes    Quit date: 02/25/2000    Years since quitting: 18.4   . Smokeless tobacco: Former Network engineer Use Topics  . Alcohol use: No  . Drug use: No     Allergies   Penicillins and Statins   Review of Systems Review of Systems  All other systems reviewed and are negative.    Physical Exam Updated Vital Signs There were no vitals taken for this visit.  Physical Exam Vitals signs and nursing note reviewed.  Constitutional:      Appearance: Normal appearance. He is normal weight.  HENT:     Head: Normocephalic.     Right Ear: External ear normal.     Left Ear: External ear normal.     Nose: Nose normal.     Mouth/Throat:     Mouth: Mucous membranes are moist.  Eyes:     Extraocular Movements: Extraocular movements intact.     Pupils: Pupils are equal, round, and reactive to light.  Neck:     Musculoskeletal: Normal range of motion.  Cardiovascular:     Rate and Rhythm: Normal rate. Rhythm irregular.  Pulmonary:     Effort: Pulmonary effort is normal.     Breath sounds: Normal breath sounds.  Abdominal:     General: Abdomen is flat. Bowel sounds are normal. There is no distension.     Palpations: Abdomen is soft. There is no mass.  Musculoskeletal: Normal range of motion.  Skin:    General: Skin is warm and dry.     Capillary Refill: Capillary refill takes less than 2 seconds.  Neurological:     General: No focal deficit present.     Mental Status: He is alert and oriented to person, place, and time.     Cranial Nerves: No cranial nerve deficit.     Sensory: No sensory deficit.     Motor: No weakness.     Coordination: Coordination normal.     Gait: Gait normal.     Deep Tendon Reflexes: Reflexes normal.  Psychiatric:        Mood and Affect: Mood normal.        Behavior: Behavior normal.      ED Treatments / Results  Labs (all labs ordered are listed, but only abnormal results are displayed) Labs Reviewed  I-STAT CHEM 8, ED - Abnormal; Notable for the following components:      Result Value   Sodium 132  (*)    BUN 37 (*)    Creatinine, Ser 1.30 (*)    Glucose, Bld 132 (*)    All other components within normal limits  PROTIME-INR  APTT  CBC  DIFFERENTIAL  COMPREHENSIVE METABOLIC PANEL  CBG MONITORING, ED    EKG    ED ECG REPORT 78  Date: 08/07/2018  Rate: normal  Rhythm: atrial fibrillation  QRS Axis: right  Intervals: atrial fib  ST/T Wave abnormalities: nonspecific ST/T changes  Conduction Disutrbances:none  Narrative Interpretation:   Old EKG Reviewed: changes noted  I have personally reviewed the EKG tracing and agree with the computerized printout as noted.   Radiology Ct Head Code Stroke Wo Contrast  Result Date: 08/07/2018 CLINICAL DATA:  Code stroke.  Left-sided weakness. EXAM: CT HEAD WITHOUT CONTRAST TECHNIQUE: Contiguous axial images were obtained from the base of the skull through the vertex without intravenous contrast. COMPARISON:  Head CT 07/24/2018 and MRI 07/25/2018 FINDINGS: Brain: There is no evidence of acute infarct, intracranial hemorrhage, mass, midline shift, or extra-axial fluid collection. Mild cerebral atrophy is again noted. Cerebral white matter hypodensities are unchanged and nonspecific but compatible with mild chronic small vessel ischemic disease. Vascular: Calcified atherosclerosis at the skull base. Likely iatrogenic venous gas in the left cavernous sinus and scattered small scalp veins bilaterally. Skull: No fracture or focal osseous lesion. Sinuses/Orbits: Visualized paranasal sinuses and mastoid air cells are clear. Orbits are unremarkable. Other: None. ASPECTS [facility] Stroke Program Early CT Score) - Ganglionic level infarction (caudate, lentiform nuclei, internal capsule, insula, M1-M3 cortex): 7 - Supraganglionic infarction (M4-M6 cortex): 3 Total score (0-10 with 10  being normal): 10 IMPRESSION: 1. No evidence of acute intracranial abnormality. 2. ASPECTS is 10. 3. Mild chronic small vessel ischemic disease. These results were communicated  to Dr. Leonel Ramsay at 8:19 am on 08/07/2018 by text page via the [facility] messaging system. Electronically Signed   By: Logan Bores M.D.   On: 08/07/2018 08:19    Procedures .Critical Care Performed by: Pattricia Boss, MD Authorized by: Pattricia Boss, MD   Critical care provider statement:    Critical care time (minutes):  45   Critical care end time:  08/07/2018 10:01 AM   Critical care was necessary to treat or prevent imminent or life-threatening deterioration of the following conditions:  CNS failure or compromise   Critical care was time spent personally by me on the following activities:  Discussions with consultants, evaluation of patient's response to treatment, examination of patient, ordering and performing treatments and interventions, ordering and review of laboratory studies, ordering and review of radiographic studies, pulse oximetry, re-evaluation of patient's condition, obtaining history from patient or surrogate and review of old charts   (including critical care time)  Medications Ordered in ED Medications  sodium chloride flush (NS) 0.9 % injection 3 mL (has no administration in time range)     Initial Impression / Assessment and Plan / ED Course  I have reviewed the triage vital signs and the nursing notes.  Pertinent labs & imaging results that were available during my care of the patient were reviewed by me and considered in my medical decision making (see chart for details).       78 yo male with known right carotid stenosis 60-79 % on [place] 07/25/2018 presents today with left side weakness and difficulty walking with lkn yesterday at 1330.  Here with normal neuro exam.  Seen with neurology.  Discussed with vascular surgery who will see in consult.  Plan admission to hospitalist service-recent d/c 07/24/18 for TIA Patient on coumadin inr 2.7 Covid ordered Discussed with Dr. Tamala Julian and will see for admission  Final Clinical Impressions(s) / ED Diagnoses   Final  diagnoses:  TIA (transient ischemic attack)  Arteriosclerosis of right carotid artery  Hypoglycemia  Renal insufficiency    ED Discharge Orders    None       Pattricia Boss, MD 08/07/18 [id number]    Pattricia Boss, MD 08/07/18 1001

## 2018-08-08 ENCOUNTER — Observation Stay (HOSPITAL_COMMUNITY): Payer: Medicare Other

## 2018-08-08 DIAGNOSIS — I2510 Atherosclerotic heart disease of native coronary artery without angina pectoris: Secondary | ICD-10-CM | POA: Diagnosis present

## 2018-08-08 DIAGNOSIS — Z01810 Encounter for preprocedural cardiovascular examination: Secondary | ICD-10-CM

## 2018-08-08 DIAGNOSIS — G459 Transient cerebral ischemic attack, unspecified: Secondary | ICD-10-CM | POA: Diagnosis present

## 2018-08-08 DIAGNOSIS — Z7984 Long term (current) use of oral hypoglycemic drugs: Secondary | ICD-10-CM | POA: Diagnosis not present

## 2018-08-08 DIAGNOSIS — D509 Iron deficiency anemia, unspecified: Secondary | ICD-10-CM | POA: Diagnosis present

## 2018-08-08 DIAGNOSIS — Z8249 Family history of ischemic heart disease and other diseases of the circulatory system: Secondary | ICD-10-CM | POA: Diagnosis not present

## 2018-08-08 DIAGNOSIS — Z1159 Encounter for screening for other viral diseases: Secondary | ICD-10-CM | POA: Diagnosis not present

## 2018-08-08 DIAGNOSIS — E11649 Type 2 diabetes mellitus with hypoglycemia without coma: Secondary | ICD-10-CM | POA: Diagnosis present

## 2018-08-08 DIAGNOSIS — I959 Hypotension, unspecified: Secondary | ICD-10-CM | POA: Diagnosis present

## 2018-08-08 DIAGNOSIS — Z833 Family history of diabetes mellitus: Secondary | ICD-10-CM | POA: Diagnosis not present

## 2018-08-08 DIAGNOSIS — I6521 Occlusion and stenosis of right carotid artery: Secondary | ICD-10-CM | POA: Diagnosis present

## 2018-08-08 DIAGNOSIS — I110 Hypertensive heart disease with heart failure: Secondary | ICD-10-CM | POA: Diagnosis present

## 2018-08-08 DIAGNOSIS — Z9981 Dependence on supplemental oxygen: Secondary | ICD-10-CM | POA: Diagnosis not present

## 2018-08-08 DIAGNOSIS — Z7982 Long term (current) use of aspirin: Secondary | ICD-10-CM | POA: Diagnosis not present

## 2018-08-08 DIAGNOSIS — I4821 Permanent atrial fibrillation: Secondary | ICD-10-CM | POA: Diagnosis present

## 2018-08-08 DIAGNOSIS — E871 Hypo-osmolality and hyponatremia: Secondary | ICD-10-CM | POA: Diagnosis present

## 2018-08-08 DIAGNOSIS — Z8349 Family history of other endocrine, nutritional and metabolic diseases: Secondary | ICD-10-CM | POA: Diagnosis not present

## 2018-08-08 DIAGNOSIS — J449 Chronic obstructive pulmonary disease, unspecified: Secondary | ICD-10-CM | POA: Diagnosis present

## 2018-08-08 DIAGNOSIS — E785 Hyperlipidemia, unspecified: Secondary | ICD-10-CM | POA: Diagnosis present

## 2018-08-08 DIAGNOSIS — Z8673 Personal history of transient ischemic attack (TIA), and cerebral infarction without residual deficits: Secondary | ICD-10-CM | POA: Diagnosis not present

## 2018-08-08 DIAGNOSIS — E7800 Pure hypercholesterolemia, unspecified: Secondary | ICD-10-CM | POA: Diagnosis present

## 2018-08-08 DIAGNOSIS — Z7901 Long term (current) use of anticoagulants: Secondary | ICD-10-CM | POA: Diagnosis not present

## 2018-08-08 DIAGNOSIS — I5022 Chronic systolic (congestive) heart failure: Secondary | ICD-10-CM | POA: Diagnosis present

## 2018-08-08 DIAGNOSIS — J9611 Chronic respiratory failure with hypoxia: Secondary | ICD-10-CM | POA: Diagnosis present

## 2018-08-08 DIAGNOSIS — N179 Acute kidney failure, unspecified: Secondary | ICD-10-CM | POA: Diagnosis present

## 2018-08-08 DIAGNOSIS — Z87891 Personal history of nicotine dependence: Secondary | ICD-10-CM | POA: Diagnosis not present

## 2018-08-08 LAB — GLUCOSE, CAPILLARY
Glucose-Capillary: 145 mg/dL — ABNORMAL HIGH (ref 70–99)
Glucose-Capillary: 45 mg/dL — ABNORMAL LOW (ref 70–99)
Glucose-Capillary: 163 mg/dL — ABNORMAL HIGH (ref 70–99)
Glucose-Capillary: 181 mg/dL — ABNORMAL HIGH (ref 70–99)
Glucose-Capillary: 253 mg/dL — ABNORMAL HIGH (ref 70–99)

## 2018-08-08 LAB — PROTIME-INR
INR: 2.4 — ABNORMAL HIGH (ref 0.8–1.2)
Prothrombin Time: 26.1 seconds — ABNORMAL HIGH (ref 11.4–15.2)

## 2018-08-08 LAB — MAGNESIUM: Magnesium: 2 mg/dL (ref 1.7–2.4)

## 2018-08-08 LAB — FERRITIN: Ferritin: 99 ng/mL (ref 24–336)

## 2018-08-08 LAB — IRON AND TIBC
Iron: 60 ug/dL (ref 45–182)
Saturation Ratios: 13 % — ABNORMAL LOW (ref 17.9–39.5)
TIBC: 454 ug/dL — ABNORMAL HIGH (ref 250–450)
UIBC: 394 ug/dL

## 2018-08-08 MED ORDER — DEXTROSE 50 % IV SOLN
25.0000 mL | Freq: Once | INTRAVENOUS | Status: AC
  Administered 2018-08-08: 25 mL via INTRAVENOUS

## 2018-08-08 MED ORDER — DEXTROSE-NACL 5-0.9 % IV SOLN
INTRAVENOUS | Status: DC
  Administered 2018-08-08: 10:00:00 via INTRAVENOUS

## 2018-08-08 MED ORDER — DEXTROSE 50 % IV SOLN
INTRAVENOUS | Status: AC
  Filled 2018-08-08: qty 50

## 2018-08-08 NOTE — Progress Notes (Signed)
STROKE TEAM PROGRESS NOTE   SUBJECTIVE (INTERVAL HISTORY) No family is at the bedside.  Patient currently no neuro deficit.  Repeat carotid Doppler continue to show right ICA 60 to 79% stenosis. VVS plan to have intervention on the right ICA mid next week.   OBJECTIVE Vitals:   08/07/18 2319 08/08/18 0547 08/08/18 0729 08/08/18 0945  BP: 101/62 99/72 112/64   Pulse: 70 90 (!) 58 84  Resp: 18 18    Temp: 97.6 F (36.4 C) 98 F (36.7 C)    TempSrc: Oral Oral    SpO2: 98% 99% 98%   Weight:      Height:        CBC:  Recent Labs  Lab 08/02/18 0500 08/07/18 0806 08/07/18 0809  WBC 8.3 9.5  --   NEUTROABS  --  7.4  --   HGB 12.9* 11.8* 13.3  HCT 40.4 37.3* 39.0  MCV 84.2 85.9  --   PLT 362 350  --     Basic Metabolic Panel:  Recent Labs  Lab 08/02/18 0500 08/07/18 0806 08/07/18 0809  NA 137 132* 132*  K 4.2 4.5 4.6  CL 104 100 100  CO2 25 22  --   GLUCOSE 153* 135* 132*  BUN 20 39* 37*  CREATININE 0.99 1.32* 1.30*  CALCIUM 9.1 9.7  --     Lipid Panel:     Component Value Date/Time   CHOL 95 07/25/2018 0443   TRIG 113 07/25/2018 0443   HDL 17 (L) 07/25/2018 0443   CHOLHDL 5.6 07/25/2018 0443   VLDL 23 07/25/2018 0443   LDLCALC 55 07/25/2018 0443   HgbA1c:  Lab Results  Component Value Date   HGBA1C 6.9 (H) 07/25/2018   Urine Drug Screen:     Component Value Date/Time   LABOPIA NONE DETECTED 07/24/2018 1451   COCAINSCRNUR NONE DETECTED 07/24/2018 1451   LABBENZ NONE DETECTED 07/24/2018 1451   AMPHETMU NONE DETECTED 07/24/2018 1451   THCU NONE DETECTED 07/24/2018 1451   LABBARB NONE DETECTED 07/24/2018 1451    Alcohol Level     Component Value Date/Time   ETH <10 07/24/2018 1508    IMAGING  Mr Brain Wo Contrast 08/07/2018 IMPRESSION:  1. No acute infarct.  2. Stable moderate atrophy and white matter disease. This likely reflects the sequela of chronic microvascular ischemia.   Ct Head Code Stroke Wo Contrast 08/07/2018 IMPRESSION:  1.  No evidence of acute intracranial abnormality.  2. ASPECTS is 10.  3. Mild chronic small vessel ischemic disease.   CTA Neck 07/24/2018 IMPRESSION: 1. No emergent finding. 2. Severe atheromatous narrowing at the right ICA origin to the degree that the lumen is not measurable. 3. Left carotid endarterectomy with ~40% stenosis at the proximal anastomosis. 4. Moderate atheromatous narrowing at the left vertebral origin. 5. Upper thoracic adenopathy that is partially covered. Recommend dedicated chest CT after convalescence. 6. Layering pleural effusions on both sides.  Chest CT Wo Contrast  07/25/2018 IMPRESSION: 1. Multiple enlarged medium-size mediastinal lymph nodes. Differential would include reactive adenopathy versus lymphoproliferative process (e.g. CLL). Favor lymphoproliferative process. Recommend clinical correlation and consider hematology oncology consult. At minimum recommend follow-up CT chest abdomen pelvis with contrast in 1 month. Include CT abdomen pelvis to evaluate for abdominal/pelvic lymphadenopathy. 2. Bilateral pleural effusions with interstitial edema consistent with congestive heart failure. 3. Coronary artery calcification and Aortic Atherosclerosis   Transthoracic Echocardiogram  07/25/2018 IMPRESSIONS  1. The left ventricle has severely reduced systolic function, with an ejection  fraction of 25-30%. The cavity size was mildly dilated. Left ventricular diastolic Doppler parameters are indeterminate.  2. LVEF is severly depressed Only base of ventricle moves well.  3. The right ventricle has moderately reduced systolic function. The cavity was mildly enlarged. There is not assessed.  4. Left atrial size was severely dilated.  5. Right atrial size was severely dilated.  6. Mildly thickened tricuspid valve leaflets.  7. The mitral valve is abnormal. Mild thickening of the mitral valve leaflet. There is mild mitral annular calcification present.  8. The tricuspid  valve is abnormal.  9. The aortic valve is tricuspid. Mild thickening of the aortic valve. Mild calcification of the aortic valve. Aortic valve regurgitation is mild by color flow Doppler. 10. The inferior vena cava was dilated in size with <50% respiratory variability. 11. The interatrial septum was not assessed.   Bilateral Carotid Dopplers   08/08/2018 Right Carotid: Velocities in the right ICA are consistent with a 60-79%                stenosis. No significant change in stenosis since prior study                done 07/25/18. Left Carotid: Patent CEA.  EKG - atrial fibrillation - ventricular response 78 BPM (See cardiology reading for complete details)   PHYSICAL EXAM  Temp:  [97.6 F (36.4 C)-98.5 F (36.9 C)] 98.2 F (36.8 C) (06/14 1132) Pulse Rate:  [58-90] 72 (06/14 1132) Resp:  [18-28] 20 (06/14 1132) BP: (99-127)/(62-76) 122/73 (06/14 1132) SpO2:  [97 %-99 %] 97 % (06/14 1132)  General - Well nourished, well developed, no acute distress  Ophthalmologic - fundi not visualized due to noncooperation.  Cardiovascular - irregularly irregular heart rate and rhythm.  Mental Status -  Level of arousal and orientation to time, place, and person were intact. Language including expression, naming, repetition, comprehension was assessed and found intact.   Fund of Knowledge was assessed and was intact.  Cranial Nerves II - XII - II - Visual field intact OU. III, IV, VI - Extraocular movements intact. V - Facial sensation intact bilaterally. VII - Facial movement intact bilaterally. VIII - Hearing & vestibular intact bilaterally. X - Palate elevates symmetrically. XI - Chin turning & shoulder shrug intact bilaterally. XII - Tongue protrusion intact.  Motor Strength - The patient's strength was normal in all extremities and pronator drift was absent.  Bulk was normal and fasciculations were absent.   Motor Tone - Muscle tone was assessed at the neck and appendages  and was normal.  Reflexes - The patient's reflexes were symmetrical in all extremities and he had no pathological reflexes.  Sensory - Light touch, temperature/pinprick were assessed and were symmetrical.    Coordination - The patient had normal movements in the hands and feet with no ataxia or dysmetria.  Tremor was absent.  Gait and Station - deferred.    ASSESSMENT/PLAN Mr. GARFIELD COINER is a 78 y.o. male with history of atrial fibrillation on coumadin, (INR - 2.6 on admission), CAD, DM, COPD, Hld, CHF, CM (EF 25-30%), and previous Lt CEA, with recent admission for TIA during which carotid endarterectomy was recommended presenting with left sided weakness. He did not receive IV t-PA due to improving deficits, late presentation (>4.5 hours from time of onset) and anticoagulation.   Right brain TIA, likely due to right ICA high-grade stenosis  CT head  - no acute findings.  MRI head -  No acute  infarct.   Carotid Doppler 08/08/2018 again right ICA 60 to 79% stenosis  VTE prophylaxis - IV Heparin  Diet - Heart healthy / carb modified with thin liquids.  aspirin 81 mg daily, warfarin daily prior to admission, now on aspirin 81 mg daily and heparin IV in preparation for right CEA next week  Patient counseled to be compliant with his antithrombotic medications  Ongoing aggressive stroke risk factor management  Therapy recommendations:  pending  Disposition:  Pending  History of TIA  07/24/2018, transient dysarthria and off balance.  CT no acute abnormality.  MRI no acute stroke.  CTA head and neck showed severe atheromatous narrowing at the right ICA origin to the degree that the lumen is not measurable.  Carotid Doppler right ICA 60 to 79% stenosis, EF 25 to 30%.  LDL 55 and A1c 6.9.  INR 2.8.  Put on aspirin on top of Coumadin.  Recommend right CEA.  Carotid stenosis  Left CEA done with Dr. Donnetta Hutching in the past  CTA neck showed right ICA/CCA high grade stenosis and left  ICA 40% stenosis  CUS right ICA 60-79 % stenosis  Dr. Donnetta Hutching is on board for right CEA mid next week  Continue heparin IV bridge to coumadin perioperatively.   BP goal 130-150 prior to procedure  Afib, chronic  On coumadin PTA  INR on admission 2.6  Rate controlled  Continue heparin IV bridge to coumadin perioperatively.   CHF  08/2017 EF 65 to 70%  07/25/18 EF 25 to 30%  On digoxin, Coreg, Lasix and Entresto as well as spironolactone  On heparin IV  Hypertension  Stable  Long-term BP goal 130-150 given carotid stenosis  Hyperlipidemia  Lipid lowering medication PTA: Zetia 10 mg daily, Fenofibrate 160 mg daily and pravastatin 80 mg daily  LDL 55, goal < 70  Current lipid lowering medication: Home medication resumed  Continue home regimen at discharge  Diabetes  HgbA1c 6.9, goal < 7.0  Controlled  SSI  CBG monitoring  Other Stroke Risk Factors  Advanced age  Former cigarette smoker - quit 18 years ago.  Coronary artery disease  Other Active Problems  Creatinine -0.99->1.32->1.30  Chest CT - 07/25/2018 - Multiple enlarged medium-size mediastinal lymph nodes. Favor lymphoproliferative process.  At minimum recommend follow-up CT chest abdomen pelvis with contrast in 1 month. Include CT abdomen pelvis to evaluate for abdominal/pelvic lymphadenopathy.  Mild hyponatremia Na - 132  Hospital day # 0  Neurology will sign off. Please call with questions. Pt will follow up with stroke clinic NP at [facility] in about 4 weeks. Thanks for the consult.   Rosalin Hawking, MD PhD Stroke Neurology 08/08/2018 3:04 PM    To contact Stroke Continuity provider, please refer to [email or web address] After hours, contact General Neurology

## 2018-08-08 NOTE — Progress Notes (Addendum)
PROGRESS NOTE    Oscar Oscar  IEP:[id number] DOB: 05-05-1940 DOA: 08/07/2018 PCP: Oscar Housekeeper, MD  Brief Narrative:  78 y.o. male with medical history significant of atrial fibrillation on Coumadin, systolic CHF 66-06%, Lt CEA in 2007, CAD, COPD on 4 L at night, DM type II, and HLD; who presented with complaints of left-sided weakness.  Patient's wife noted some difficulty with them walking yesterday at 1:30 pm.  Patient reports waking up this morning and feeling loopy with slurred speech and left-sided weakness. Upon EMS arrival patient reported patient's initial blood glucose 55.  Patient symptoms reportedly resolved after receiving D10. Patient does not routinely check his blood sugars at home, but does not recall having any significant lows prior. He is on glipizide, Tradjenta, and metformin.  Patient had just been admitted into the hospital from 5/30-6/8, after presenting with suspected TIA found to have occluded right internal carotid artery stenosis, acute hypoxic respiratory failure, and new onset systolic congestive heart failure with EF 25 to 30%.  Patient was diuresed and underwent cardiac catheter which revealed severe three-vessel coronary artery disease.  Patient was evaluated and appears to have declined carotid endarterectomy by vascular surgery as well as CABG or high risk PCI.  ED Course: Patient presented as a code stroke.  Dr. Leonel Oscar neurology was consulted and initial CT scan of the brain showed no acute abnormalities.  Vital signs relatively within normal limits.  Labs revealed WBC 9.5, hemoglobin 11.8 with elevated RDW, 2, BUN 39, and creatinine 1.32.  Neurology recommended checking MRI of the brain.  Vascular surgery Dr. Trula [name] had formally consulted.   Assessment & Plan:   Principal Problem:   TIA (transient ischemic attack) Active Problems:   ATRIAL FIBRILLATION   Carotid artery stenosis, symptomatic, right   Dyslipidemia   Normocytic normochromic  anemia   COPD without exacerbation (HCC)   AKI (acute kidney injury) ([place])   Type 2 diabetes mellitus with hypoglycemia without coma ([place])    #1 TIA secondary to symptomatic right carotid stenosis.  Patient was found to have right upper extremity weakness which lasted for 30 minutes.  During his last hospital stay 2 weeks ago he was told he needed carotid endarterectomy but he refused and wanted to have it done as an outpatient.  He also had a cardiac cath during his last hospital stay showed severe three-vessel disease but he did not want any cardiac work-up CABG or PCI.  Seen by vascular surgery plan for surgery on Monday.  Patient had recent TIA work-up with LDL of 55 A1c of 6.9 right carotid stenosis 60 to 79%. PATIENT IS AT RISK FOR RECURRENT STROKE WITHOUT CEA.Oscar Oscar #2 chronic atrial fibrillation on Coumadin rate controlled on digoxin and Coreg.  Patient having on and off.'s of bradycardia hold Coreg  check a digoxin level.  On heparin bridge in preparation for surgery Coumadin on hold.  #3 hyperlipidemia last lipids Jul 25, 2018 was total cholesterol 95 HDL 17 LDL 55 triglycerides 113 he is on pravastatin and Zetia and fenofibrate.  #4 type 2 diabetes with.'s of hypoglycemia-patient takes Tradjenta metformin repaglinide and glipizide at home.  His last hemoglobin A1c is 6.9 on May 31.  His blood sugar stays low during the hospital stay this morning it was 45 went up to 145.  Hold all anti-hypoglycemic agents and I will place him on D5 drip.  #5 AKI last creatinine noted is 0.99 he was admitted with a creatinine of 1.32 he was given  gentle IV fluids and repeat creatinine 1.30.  He is on Entresto Lasix and Aldactone which is probably another reason why his creatinine is elevated than his baseline.  Monitor closely.  #6 hyponatremia sodium 132 he is on D5 NS monitor closely.  #7 systolic heart failure with ejection fraction 30% on Jul 25, 2018.  He appears more volume  depleted.  He is on slow IV fluids monitor closely..  #8 coronary artery disease with three-vessel CAD patient does not want PCI or CABG.  He was noted to be high risk candidate for CABG.  #9 essential hypertension blood pressure appears soft this morning.on lasix aldactone and entresto..when I saw him this morning his systolic bp was in high 96E.will continue entresto and hold lasix and aldactone for sbp less than 110.  #10 COPD stable uses 4 L of oxygen at night at home.  PRN albuterol.  #11 chronic iron deficiency anemia hemoglobin stable at 13.3.  This patient needs continuous inpatient monitoring due to persistent hypoglycemia hypotension and is symptomatic carotid artery stenosis needing carotid endarterectomy ASAP.he is at high risk for recurrent stroke,  DVT prophylaxis: heparin Code Status: Full Family Communication: dw rose wife Disposition Plan: Pending carotid endarterectomy Consults called: Neurology, vascular surgery     Estimated body mass index is 24.17 kg/m as calculated from the following:   Height as of this encounter: 5\' 7"  (1.702 m).   Weight as of this encounter: 70 kg.   Subjective: Feels okay weakness is getting better overnight staff reports his blood sugar was low at 45 this morning given oral orange juice and came up to 145.  Objective: Vitals:   08/07/18 1934 08/07/18 2319 08/08/18 0547 08/08/18 0729  BP: 107/66 101/62 99/72 112/64  Pulse: 79 70 90 (!) 58  Resp: (!) 25 18 18    Temp: 97.9 F (36.6 C) 97.6 F (36.4 C) 98 F (36.7 C)   TempSrc: Oral Oral Oral   SpO2: 98% 98% 99% 98%  Weight:      Height:        Intake/Output Summary (Last 24 hours) at 08/08/2018 0912 Last data filed at 08/08/2018 0200 Gross per 24 hour  Intake -  Output 570 ml  Net -570 ml   Filed Weights   08/07/18 0836  Weight: 70 kg    Examination:  General exam: Appears calm and comfortable  Respiratory system: Clear to auscultation. Respiratory effort  normal. Cardiovascular system: S1 & S2 heard, RRR. No JVD, murmurs, rubs, gallops or clicks. No pedal edema. Gastrointestinal system: Abdomen is nondistended, soft and nontender. No organomegaly or masses felt. Normal bowel sounds heard. Central nervous system: Alert and oriented. No focal neurological deficits. Extremities: Symmetric 5 x 5 power. Skin: No rashes, lesions or ulcers Psychiatry: Judgement and insight appear normal. Mood & affect appropriate.     Data Reviewed: I have personally reviewed following labs and imaging studies  CBC: Recent Labs  Lab 08/02/18 0500 08/07/18 0806 08/07/18 0809  WBC 8.3 9.5  --   NEUTROABS  --  7.4  --   HGB 12.9* 11.8* 13.3  HCT 40.4 37.3* 39.0  MCV 84.2 85.9  --   PLT 362 350  --    Basic Metabolic Panel: Recent Labs  Lab 08/02/18 0500 08/07/18 0806 08/07/18 0809  NA 137 132* 132*  K 4.2 4.5 4.6  CL 104 100 100  CO2 25 22  --   GLUCOSE 153* 135* 132*  BUN 20 39* 37*  CREATININE 0.99 1.32*  1.30*  CALCIUM 9.1 9.7  --    GFR: Estimated Creatinine Clearance: 43.1 mL/min (A) (by C-G formula based on SCr of 1.3 mg/dL (H)). Liver Function Tests: Recent Labs  Lab 08/07/18 0806  AST 52*  ALT 38  ALKPHOS 49  BILITOT 0.9  PROT 5.9*  ALBUMIN 3.4*   No results for input(s): LIPASE, AMYLASE in the last 168 hours. No results for input(s): AMMONIA in the last 168 hours. Coagulation Profile: Recent Labs  Lab 08/02/18 0500 08/07/18 0806  INR 1.5* 2.6*   Cardiac Enzymes: No results for input(s): CKTOTAL, CKMB, CKMBINDEX, TROPONINI in the last 168 hours. BNP (last 3 results) No results for input(s): PROBNP in the last 8760 hours. HbA1C: No results for input(s): HGBA1C in the last 72 hours. CBG: Recent Labs  Lab 08/07/18 1214 08/07/18 1610 08/07/18 2136 08/08/18 0637 08/08/18 0713  GLUCAP 75 110* 71 45* 145*   Lipid Profile: No results for input(s): CHOL, HDL, LDLCALC, TRIG, CHOLHDL, LDLDIRECT in the last 72  hours. Thyroid Function Tests: No results for input(s): TSH, T4TOTAL, FREET4, T3FREE, THYROIDAB in the last 72 hours. Anemia Panel: No results for input(s): VITAMINB12, FOLATE, FERRITIN, TIBC, IRON, RETICCTPCT in the last 72 hours. Sepsis Labs: No results for input(s): PROCALCITON, LATICACIDVEN in the last 168 hours.  Recent Results (from the past 240 hour(s))  SARS Coronavirus 2     Status: None   Collection Time: 08/07/18  9:00 AM  Result Value Ref Range Status   SARS Coronavirus 2 NOT DETECTED NOT DETECTED Final    Comment: (NOTE) SARS-CoV-2 target nucleic acids are NOT DETECTED. The SARS-CoV-2 RNA is generally detectable in upper and lower respiratory specimens during the acute phase of infection.  Negative  results do not preclude SARS-CoV-2 infection, do not rule out co-infections with other pathogens, and should not be used as the sole basis for treatment or other patient management decisions.  Negative results must be combined with clinical observations, patient history, and epidemiological information. The expected result is Not Detected. Fact Sheet for Patients: http://www.biofiredefense.com/wp-content/uploads/2020/03/BIOFIRE-COVID -19-patients.pdf Fact Sheet for Healthcare Providers: http://www.biofiredefense.com/wp-content/uploads/2020/03/BIOFIRE-COVID -19-hcp.pdf This test is not yet approved or cleared by the [place] and  has been authorized for detection and/or diagnosis of SARS-CoV-2 by FDA under an Emergency Use Authorization (EUA).  This EUA will remain in effec t (meaning this test can be used) for the duration of  the COVID-19 declaration under Section 564(b)(1) of the Act, 21 U.S.C. section 360bbb-3(b)(1), unless the authorization is terminated or revoked sooner. Performed at [facility], [place] [name] [place], [place] [address]          Radiology Studies: Mr Brain Wo Contrast  Result Date: 08/07/2018 CLINICAL DATA:  Stroke  follow-up. Acute onset of recurrent left-sided weakness lasting approximately 30 minutes this morning. Known right carotid artery stenosis. EXAM: MRI HEAD WITHOUT CONTRAST TECHNIQUE: Multiplanar, multiecho pulse sequences of the brain and surrounding structures were obtained without intravenous contrast. COMPARISON:  Insert CT head without contrast 08/07/2018. MRI brain 07/25/2018 FINDINGS: Brain: The diffusion-weighted images demonstrate no acute or subacute infarction. Moderate periventricular and subcortical T2 hyperintensities bilaterally are associated with atrophy. No acute hemorrhage or mass lesion is present. Remote ischemic changes are present in the thalami bilaterally. There is some white matter changes extending into the brainstem. The cerebellum is normal. The ventricles are proportionate to the degree of atrophy. No significant extraaxial fluid collection is present. Vascular: Flow is present in the major intracranial arteries. Skull and upper  cervical spine: The craniocervical junction is normal. Upper cervical spine is within normal limits. Chronic changes of clivus are stable. Sinuses/Orbits: A polyp or mucous retention cyst is noted at the floor of the right maxillary sinus. The paranasal sinuses and mastoid air cells are otherwise clear. The globes and orbits are within normal limits. IMPRESSION: 1. No acute infarct. 2. Stable moderate atrophy and white matter disease. This likely reflects the sequela of chronic microvascular ischemia. Electronically Signed   By: San Morelle M.D.   On: 08/07/2018 16:10   Ct Head Code Stroke Wo Contrast  Result Date: 08/07/2018 CLINICAL DATA:  Code stroke.  Left-sided weakness. EXAM: CT HEAD WITHOUT CONTRAST TECHNIQUE: Contiguous axial images were obtained from the base of the skull through the vertex without intravenous contrast. COMPARISON:  Head CT 07/24/2018 and MRI 07/25/2018 FINDINGS: Brain: There is no evidence of acute infarct, intracranial  hemorrhage, mass, midline shift, or extra-axial fluid collection. Mild cerebral atrophy is again noted. Cerebral white matter hypodensities are unchanged and nonspecific but compatible with mild chronic small vessel ischemic disease. Vascular: Calcified atherosclerosis at the skull base. Likely iatrogenic venous gas in the left cavernous sinus and scattered small scalp veins bilaterally. Skull: No fracture or focal osseous lesion. Sinuses/Orbits: Visualized paranasal sinuses and mastoid air cells are clear. Orbits are unremarkable. Other: None. ASPECTS [facility] Stroke Program Early CT Score) - Ganglionic level infarction (caudate, lentiform nuclei, internal capsule, insula, M1-M3 cortex): 7 - Supraganglionic infarction (M4-M6 cortex): 3 Total score (0-10 with 10 being normal): 10 IMPRESSION: 1. No evidence of acute intracranial abnormality. 2. ASPECTS is 10. 3. Mild chronic small vessel ischemic disease. These results were communicated to Dr. Leonel Oscar at 8:19 am on 08/07/2018 by text page via the [facility] messaging system. Electronically Signed   By: Logan Bores M.D.   On: 08/07/2018 08:19        Scheduled Meds: . aspirin EC  81 mg Oral Daily  . carvedilol  3.125 mg Oral BID WC  . dextrose      . digoxin  0.125 mg Oral Daily  . ezetimibe  10 mg Oral Daily  . fenofibrate  160 mg Oral Daily  . furosemide  40 mg Oral Daily  . insulin aspart  0-5 Units Subcutaneous QHS  . insulin aspart  0-9 Units Subcutaneous TID WC  . pravastatin  80 mg Oral Daily  . sacubitril-valsartan  1 tablet Oral BID  . sodium chloride flush  3 mL Intravenous Q12H  . spironolactone  25 mg Oral Daily   Continuous Infusions:   LOS: 0 days     Georgette Shell, MD [facility]  If 7PM-7AM, please contact night-coverage [email or web address] Password [facility] 08/08/2018, 9:12 AM

## 2018-08-08 NOTE — Progress Notes (Signed)
VASCULAR LAB PRELIMINARY  PRELIMINARY  PRELIMINARY  PRELIMINARY  Right carotid duplex completed.    Preliminary report:  See CV proc for preliminary results.   Devere Brem, RVT 08/08/2018, 12:10 PM

## 2018-08-08 NOTE — Progress Notes (Signed)
We will plan for carotid revascularization, either CEA or TCAR mid week.  Continue to hold coumadin.  Dr Donnetta Hutching to see patient tomorrow   Annamarie Major

## 2018-08-09 LAB — CBC WITH DIFFERENTIAL/PLATELET
Abs Immature Granulocytes: 0.07 10*3/uL (ref 0.00–0.07)
Basophils Absolute: 0.1 10*3/uL (ref 0.0–0.1)
Basophils Relative: 1 %
Eosinophils Absolute: 0.1 10*3/uL (ref 0.0–0.5)
Eosinophils Relative: 1 %
HCT: 36.8 % — ABNORMAL LOW (ref 39.0–52.0)
Hemoglobin: 11.9 g/dL — ABNORMAL LOW (ref 13.0–17.0)
Immature Granulocytes: 1 %
Lymphocytes Relative: 19 %
Lymphs Abs: 1.7 10*3/uL (ref 0.7–4.0)
MCH: 27 pg (ref 26.0–34.0)
MCHC: 32.3 g/dL (ref 30.0–36.0)
MCV: 83.6 fL (ref 80.0–100.0)
Monocytes Absolute: 1 10*3/uL (ref 0.1–1.0)
Monocytes Relative: 11 %
Neutro Abs: 6.1 10*3/uL (ref 1.7–7.7)
Neutrophils Relative %: 67 %
Platelets: 444 10*3/uL — ABNORMAL HIGH (ref 150–400)
RBC: 4.4 MIL/uL (ref 4.22–5.81)
RDW: 17.5 % — ABNORMAL HIGH (ref 11.5–15.5)
WBC: 9 10*3/uL (ref 4.0–10.5)
nRBC: 0 % (ref 0.0–0.2)

## 2018-08-09 LAB — COMPREHENSIVE METABOLIC PANEL
ALT: 34 U/L (ref 0–44)
AST: 35 U/L (ref 15–41)
Albumin: 3.2 g/dL — ABNORMAL LOW (ref 3.5–5.0)
Alkaline Phosphatase: 44 U/L (ref 38–126)
Anion gap: 11 (ref 5–15)
BUN: 33 mg/dL — ABNORMAL HIGH (ref 8–23)
CO2: 19 mmol/L — ABNORMAL LOW (ref 22–32)
Calcium: 9.1 mg/dL (ref 8.9–10.3)
Chloride: 103 mmol/L (ref 98–111)
Creatinine, Ser: 1.17 mg/dL (ref 0.61–1.24)
GFR calc Af Amer: >60 mL/min (ref >60)
GFR calc non Af Amer: 59 mL/min — ABNORMAL LOW (ref >60)
Glucose, Bld: 171 mg/dL — ABNORMAL HIGH (ref 70–99)
Potassium: 4.5 mmol/L (ref 3.5–5.1)
Sodium: 133 mmol/L — ABNORMAL LOW (ref 135–145)
Total Bilirubin: 1.6 mg/dL — ABNORMAL HIGH (ref 0.3–1.2)
Total Protein: 6.1 g/dL — ABNORMAL LOW (ref 6.5–8.1)

## 2018-08-09 LAB — DIGOXIN LEVEL: Digoxin Level: 0.5 ng/mL — ABNORMAL LOW (ref 0.8–2.0)

## 2018-08-09 LAB — GLUCOSE, CAPILLARY
Glucose-Capillary: 156 mg/dL — ABNORMAL HIGH (ref 70–99)
Glucose-Capillary: 234 mg/dL — ABNORMAL HIGH (ref 70–99)
Glucose-Capillary: 228 mg/dL — ABNORMAL HIGH (ref 70–99)
Glucose-Capillary: 158 mg/dL — ABNORMAL HIGH (ref 70–99)

## 2018-08-09 LAB — PROTIME-INR
INR: 1.9 — ABNORMAL HIGH (ref 0.8–1.2)
Prothrombin Time: 21.4 seconds — ABNORMAL HIGH (ref 11.4–15.2)

## 2018-08-09 LAB — HEPARIN LEVEL (UNFRACTIONATED): Heparin Unfractionated: 0.17 IU/mL — ABNORMAL LOW (ref 0.30–0.70)

## 2018-08-09 MED ORDER — HEPARIN (PORCINE) 25000 UT/250ML-% IV SOLN
1350.0000 [IU]/h | INTRAVENOUS | Status: DC
  Administered 2018-08-09: 1200 [IU]/h via INTRAVENOUS
  Administered 2018-08-10: 1350 [IU]/h via INTRAVENOUS
  Filled 2018-08-09 (×3): qty 250

## 2018-08-09 NOTE — Progress Notes (Addendum)
PROGRESS NOTE    Oscar [name]  WEX:[id number] DOB: 1940/12/08 DOA: 08/07/2018 PCP: Dione Housekeeper, MD  Brief Narrative: 78 y.o.malewith medical history significant ofatrial fibrillation on Coumadin, systolic CHF 93-81%,OFBPZ in 2007,CAD, COPDon 4 L at night, DM type II, and HLD; who presented with complaints of left-sided weakness.Patient's wife noted some difficulty with them walking yesterday at 1:30 pm. Patient reports waking up this morning and feeling loopy with slurred speech and left-sided weakness. Upon EMS arrival patient reported patient's initial blood glucose 55. Patient symptoms reportedly resolved after receiving D10. Patient does not routinely check his blood sugars at home, but does not recall having any significant lows prior. He is on glipizide, Tradjenta, and metformin.  Patient had just been admitted into the hospital from 5/30-6/8,after presenting with suspected TIA found to have occluded right internal carotid artery stenosis, acute hypoxic respiratory failure, and new onset systolic congestive heart failure with EF 25 to 30%. Patient was diuresed and underwent cardiac catheter which revealed severe three-vessel coronary artery disease. Patient was evaluated and appears to have declined carotid endarterectomy by vascular surgery as well as CABG or high risk PCI.  ED Course:Patient presented as a code stroke.Dr. Leonel Ramsay neurology was consulted and initial CT scan of the brain showed no acute abnormalities. Vital signs relatively within normal limits. Labs revealed WBC 9.5, hemoglobin 11.8 with elevated RDW, 2, BUN 39, andcreatinine 1.32.Neurology recommended checking MRI of the brain. Vascular surgery Dr. Trula Slade hadformally consulted  Assessment & Plan:   Principal Problem:   TIA (transient ischemic attack) Active Problems:   ATRIAL FIBRILLATION   Carotid artery stenosis, symptomatic, right   Dyslipidemia   Normocytic normochromic anemia    COPD without exacerbation (HCC)   AKI (acute kidney injury) ([place])   Type 2 diabetes mellitus with hypoglycemia without coma ([place])   #1 TIA secondary to symptomatic right carotid stenosis.  Patient was found to have right upper extremity weakness which lasted for 30 minutes.  During his last hospital stay 2 weeks ago he was told he needed carotid endarterectomy but he refused and wanted to have it done as an outpatient.  He also had a cardiac cath during his last hospital stay showed severe three-vessel disease but he did not want any cardiac work-up CABG or PCI.  Seen by vascular surgery plan for surgery on Monday.  Patient had recent TIA work-up with LDL of 55 A1c of 6.9 right carotid stenosis 60 to 79%. PATIENT IS AT RISK FOR RECURRENT STROKE WITHOUT CEA.[place] #2 chronic atrial fibrillation on Coumadin rate controlled on digoxin and Coreg.  Patient having on and off.'s of bradycardia hold Coreg  check a digoxin level.  On heparin bridge in preparation for surgery Coumadin on hold. PLAN FOR CEA 6/17  #3 hyperlipidemia last lipids Jul 25, 2018 was total cholesterol 95 HDL 17 LDL 55 triglycerides 113 he is on pravastatin and Zetia and fenofibrate.  #4 type 2 diabetes with.'s of hypoglycemia-resolved d5 stopped.continue to hold hypoglycemic agents .patient takes Tradjenta metformin repaglinide and glipizide at home.  His last hemoglobin A1c is 6.9 on May 31.    #5 AKI last creatinine noted is 0.99 he was admitted with a creatinine of 1.32 he was given gentle IV fluids and repeat creatinine 1.30.  He is on Entresto Lasix and Aldactone which is probably another reason why his creatinine is elevated than his baseline.  Monitor closely.todays bmp pending.  #6 hyponatremia sodium 132 pending today lab  #7 systolic heart failure  with ejection fraction 30% on Jul 25, 2018.  continue lasix and aldactone and entresto  #8 coronary artery disease with three-vessel CAD patient does  not want PCI or CABG.  He was noted to be high risk candidate for CABG.  #9 essential hypertension improved after holding bp meds yesterday.monitor.  #10 COPD stable uses 4 L of oxygen at night at home.  PRN albuterol.  #11 chronic iron deficiency anemia hemoglobin stable at 13.3.  This patient needs continuous inpatient monitoring due to symptomatic carotid artery stenosis needing carotid endarterectomy .he is at high risk for recurrent stroke.  DVT prophylaxis:heparin Code Status:Full Family Communication:dw rose wife Disposition Plan: Pending carotid endarterectomy Consults called:Neurology, vascular surgery     Estimated body mass index is 24.17 kg/m as calculated from the following:   Height as of this encounter: 5\' 7"  (1.702 m).   Weight as of this encounter: 70 kg.   Subjective: Resting in bed no further weakness anxious to have surgery and get out of the hospital  Objective: Vitals:   08/08/18 2032 08/08/18 2321 08/09/18 0421 08/09/18 0700  BP: 136/75 122/70  131/77  Pulse:    93  Resp:    (!) 26  Temp: 98 F (36.7 C) 97.8 F (36.6 C) 97.7 F (36.5 C) (!) 97.3 F (36.3 C)  TempSrc: Oral Oral Oral Oral  SpO2:    100%  Weight:      Height:        Intake/Output Summary (Last 24 hours) at 08/09/2018 0852 Last data filed at 08/09/2018 5621 Gross per 24 hour  Intake 1447.04 ml  Output 300 ml  Net 1147.04 ml   Filed Weights   08/07/18 0836  Weight: 70 kg    Examination:  General exam: Appears calm and comfortable  Respiratory system: Clear to auscultation. Respiratory effort normal. Cardiovascular system: S1 & S2 heard, RRR. No JVD, murmurs, rubs, gallops or clicks. No pedal edema. Gastrointestinal system: Abdomen is nondistended, soft and nontender. No organomegaly or masses felt. Normal bowel sounds heard. Central nervous system: Alert and oriented. No focal neurological deficits. Extremities: Symmetric 5 x 5 power. Skin: No rashes, lesions  or ulcers Psychiatry: Judgement and insight appear normal. Mood & affect appropriate.     Data Reviewed: I have personally reviewed following labs and imaging studies  CBC: Recent Labs  Lab 08/07/18 0806 08/07/18 0809  WBC 9.5  --   NEUTROABS 7.4  --   HGB 11.8* 13.3  HCT 37.3* 39.0  MCV 85.9  --   PLT 350  --    Basic Metabolic Panel: Recent Labs  Lab 08/07/18 0806 08/07/18 0809 08/08/18 1033  NA 132* 132*  --   K 4.5 4.6  --   CL 100 100  --   CO2 22  --   --   GLUCOSE 135* 132*  --   BUN 39* 37*  --   CREATININE 1.32* 1.30*  --   CALCIUM 9.7  --   --   MG  --   --  2.0   GFR: Estimated Creatinine Clearance: 43.1 mL/min (A) (by C-G formula based on SCr of 1.3 mg/dL (H)). Liver Function Tests: Recent Labs  Lab 08/07/18 0806  AST 52*  ALT 38  ALKPHOS 49  BILITOT 0.9  PROT 5.9*  ALBUMIN 3.4*   No results for input(s): LIPASE, AMYLASE in the last 168 hours. No results for input(s): AMMONIA in the last 168 hours. Coagulation Profile: Recent Labs  Lab 08/07/18 [phone]  08/08/18 1033  INR 2.6* 2.4*   Cardiac Enzymes: No results for input(s): CKTOTAL, CKMB, CKMBINDEX, TROPONINI in the last 168 hours. BNP (last 3 results) No results for input(s): PROBNP in the last 8760 hours. HbA1C: No results for input(s): HGBA1C in the last 72 hours. CBG: Recent Labs  Lab 08/08/18 0713 08/08/18 1152 08/08/18 1651 08/08/18 2116 08/09/18 0628  GLUCAP 145* 163* 181* 253* 156*   Lipid Profile: No results for input(s): CHOL, HDL, LDLCALC, TRIG, CHOLHDL, LDLDIRECT in the last 72 hours. Thyroid Function Tests: No results for input(s): TSH, T4TOTAL, FREET4, T3FREE, THYROIDAB in the last 72 hours. Anemia Panel: Recent Labs    08/08/18 1033  FERRITIN 99  TIBC 454*  IRON 60   Sepsis Labs: No results for input(s): PROCALCITON, LATICACIDVEN in the last 168 hours.  Recent Results (from the past 240 hour(s))  SARS Coronavirus 2     Status: None   Collection Time:  08/07/18  9:00 AM  Result Value Ref Range Status   SARS Coronavirus 2 NOT DETECTED NOT DETECTED Final    Comment: (NOTE) SARS-CoV-2 target nucleic acids are NOT DETECTED. The SARS-CoV-2 RNA is generally detectable in upper and lower respiratory specimens during the acute phase of infection.  Negative  results do not preclude SARS-CoV-2 infection, do not rule out co-infections with other pathogens, and should not be used as the sole basis for treatment or other patient management decisions.  Negative results must be combined with clinical observations, patient history, and epidemiological information. The expected result is Not Detected. Fact Sheet for Patients: http://www.biofiredefense.com/wp-content/uploads/2020/03/BIOFIRE-COVID -19-patients.pdf Fact Sheet for Healthcare Providers: http://www.biofiredefense.com/wp-content/uploads/2020/03/BIOFIRE-COVID -19-hcp.pdf This test is not yet approved or cleared by the [place] and  has been authorized for detection and/or diagnosis of SARS-CoV-2 by FDA under an Emergency Use Authorization (EUA).  This EUA will remain in effec t (meaning this test can be used) for the duration of  the COVID-19 declaration under Section 564(b)(1) of the Act, 21 U.S.C. section 360bbb-3(b)(1), unless the authorization is terminated or revoked sooner. Performed at [facility], [place] [address] [place], [place] [address]          Radiology Studies: Mr Brain Wo Contrast  Result Date: 08/07/2018 CLINICAL DATA:  Stroke follow-up. Acute onset of recurrent left-sided weakness lasting approximately 30 minutes this morning. Known right carotid artery stenosis. EXAM: MRI HEAD WITHOUT CONTRAST TECHNIQUE: Multiplanar, multiecho pulse sequences of the brain and surrounding structures were obtained without intravenous contrast. COMPARISON:  Insert CT head without contrast 08/07/2018. MRI brain 07/25/2018 FINDINGS: Brain: The diffusion-weighted images  demonstrate no acute or subacute infarction. Moderate periventricular and subcortical T2 hyperintensities bilaterally are associated with atrophy. No acute hemorrhage or mass lesion is present. Remote ischemic changes are present in the thalami bilaterally. There is some white matter changes extending into the brainstem. The cerebellum is normal. The ventricles are proportionate to the degree of atrophy. No significant extraaxial fluid collection is present. Vascular: Flow is present in the major intracranial arteries. Skull and upper cervical spine: The craniocervical junction is normal. Upper cervical spine is within normal limits. Chronic changes of clivus are stable. Sinuses/Orbits: A polyp or mucous retention cyst is noted at the floor of the right maxillary sinus. The paranasal sinuses and mastoid air cells are otherwise clear. The globes and orbits are within normal limits. IMPRESSION: 1. No acute infarct. 2. Stable moderate atrophy and white matter disease. This likely reflects the sequela of chronic microvascular ischemia. Electronically Signed   By:  San Morelle M.D.   On: 08/07/2018 16:10   Vas US Carotid  Result Date: 08/08/2018 Carotid Arterial Duplex Study Indications:       Confirm patency. Pre-surgical evaluation. Risk Factors:      Hyperlipidemia, Diabetes, coronary artery disease. Comparison Study:  Prior study from 07/25/18 is available for comparison. Performing Technologist: Sharion Dove RVS  Examination Guidelines: A complete evaluation includes B-mode imaging, spectral Doppler, color Doppler, and power Doppler as needed of all accessible portions of each vessel. Bilateral testing is considered an integral part of a complete examination. Limited examinations for reoccurring indications may be performed as noted.  Right Carotid Findings: +----------+--------+--------+--------+---------------------+------------------+           PSV cm/sEDV cm/sStenosisDescribe              Comments           +----------+--------+--------+--------+---------------------+------------------+ CCA Prox  69      14                                   intimal thickening +----------+--------+--------+--------+---------------------+------------------+ CCA Distal50      11                                   intimal thickening +----------+--------+--------+--------+---------------------+------------------+ ICA Prox  343     76              irregular and                                                             calcific                                +----------+--------+--------+--------+---------------------+------------------+ ICA Mid   139     41                                                      +----------+--------+--------+--------+---------------------+------------------+ ICA Distal90      27                                                      +----------+--------+--------+--------+---------------------+------------------+ ECA       109     2                                                       +----------+--------+--------+--------+---------------------+------------------+ +----------+--------+-------+--------+-------------------+           PSV cm/sEDV cmsDescribeArm Pressure (mmHG) +----------+--------+-------+--------+-------------------+ [id number]                                        +----------+--------+-------+--------+-------------------+ +---------+--------+--+--------+--+  VertebralPSV cm/s66EDV cm/s17 +---------+--------+--+--------+--+  Left Carotid Findings: +--------+--------+--------+--------+------------+--------+         PSV cm/sEDV cm/sStenosisDescribe    Comments +--------+--------+--------+--------+------------+--------+ ICA Prox62      14              heterogenous         +--------+--------+--------+--------+------------+--------+  Summary: Right Carotid: Velocities in the right ICA are consistent  with a 60-79%                stenosis. No significant change in stenosis since prior study                done 07/25/18. Left Carotid: Patent CEA. Vertebrals:  Right vertebral artery demonstrates antegrade flow. Subclavians: Normal flow hemodynamics were seen in the right subclavian artery. *See table(s) above for measurements and observations.  Electronically signed by Harold Barban MD on 08/08/2018 at 4:34:38 PM.    Final         Scheduled Meds: . aspirin EC  81 mg Oral Daily  . digoxin  0.125 mg Oral Daily  . ezetimibe  10 mg Oral Daily  . fenofibrate  160 mg Oral Daily  . furosemide  40 mg Oral Daily  . insulin aspart  0-5 Units Subcutaneous QHS  . insulin aspart  0-9 Units Subcutaneous TID WC  . pravastatin  80 mg Oral Daily  . sacubitril-valsartan  1 tablet Oral BID  . sodium chloride flush  3 mL Intravenous Q12H  . spironolactone  25 mg Oral Daily   Continuous Infusions: . dextrose 5 % and 0.9% NaCl 50 mL/hr at 08/08/18 0951     LOS: 1 day     Georgette Shell, MD [facility]  If 7PM-7AM, please contact night-coverage [email or web address] Password [facility] 08/09/2018, 8:52 AM

## 2018-08-09 NOTE — Progress Notes (Signed)
[place] for heparin Indication: atrial fibrillation  Patient Measurements: Height: 5\' 7"  (170.2 cm) Weight: 154 lb 5.2 oz (70 kg) IBW/kg (Calculated) : 66.1 Heparin Dosing Weight: 70 kg  Vital Signs: Temp: 97.3 F (36.3 C) (06/15 0700) Temp Source: Oral (06/15 0700) BP: 131/77 (06/15 0700) Pulse Rate: 93 (06/15 0700)  Labs: Recent Labs    08/07/18 0806 08/07/18 0809 08/08/18 1033 08/09/18 0804  HGB 11.8* 13.3  --  11.9*  HCT 37.3* 39.0  --  36.8*  PLT 350  --   --  444*  APTT 44*  --   --   --   LABPROT 27.7*  --  26.1* 21.4*  INR 2.6*  --  2.4* 1.9*  CREATININE 1.32* 1.30*  --  1.17   Assessment: 78 yo male admitted with slurred speech and L-sided weakness. He is on warfarin prior to admission for AFib. He has had a prior TIA in the past. INR is now <2 so heparin will be started. Baseline Hgb is low but stable and platelets are elevated. Pending CEA.  Goal of Therapy:  INR 2-3 Monitor platelets by anticoagulation protocol: Yes   Plan:  Heparin gtt 1200 units/hr Check an 8 hr heparin level Daily heparin level and CBC  Misha Vanoverbeke, Rande Lawman 08/09/2018,9:46 AM

## 2018-08-09 NOTE — Progress Notes (Signed)
[place] for heparin Indication: atrial fibrillation  Patient Measurements: Height: 5\' 7"  (170.2 cm) Weight: 154 lb 5.2 oz (70 kg) IBW/kg (Calculated) : 66.1 Heparin Dosing Weight: 70 kg  Vital Signs: Temp: 97.9 F (36.6 C) (06/15 1927) Temp Source: Oral (06/15 1927) BP: 146/87 (06/15 1927) Pulse Rate: 80 (06/15 1500)  Labs: Recent Labs    08/07/18 0806 08/07/18 0809 08/08/18 1033 08/09/18 0804 08/09/18 1930  HGB 11.8* 13.3  --  11.9*  --   HCT 37.3* 39.0  --  36.8*  --   PLT 350  --   --  444*  --   APTT 44*  --   --   --   --   LABPROT 27.7*  --  26.1* 21.4*  --   INR 2.6*  --  2.4* 1.9*  --   HEPARINUNFRC  --   --   --   --  0.17*  CREATININE 1.32* 1.30*  --  1.17  --    Assessment: 78 yo male admitted with slurred speech and L-sided weakness. He is on warfarin prior to admission for AFib. He has had a prior TIA in the past. INR is now <2 so heparin will be started. Baseline Hgb is low but stable and platelets are elevated. Pending CEA.  PM Heparin level = 0.17  Goal of Therapy:  INR 2-3 Monitor platelets by anticoagulation protocol: Yes   Plan:  Heparin gtt to 1350 units / hr Daily heparin level and CBC  Thank you Anette Guarneri, PharmD 08/09/2018,8:57 PM

## 2018-08-09 NOTE — Progress Notes (Signed)
Patient ID: Oscar [name], male   DOB: 1941-02-20, 78 y.o.   MRN: [id number] Comfortable.  No new neurologic deficits. Discussed recurrent neurologic deficits with high-grade right carotid stenosis. Also reviewed his films with Dr. Fortunato Curling regarding the option of TCAR under local anesthesia as an alternative to endarterectomy under general anesthesia.  He reviewed the films and also with the device manufacturer.  Is felt that he would be at high risk for stenting due to his extensive calcification with concern about not being able to open the stenosis.  Cardiology during his last admission felt that he was at some increased risk due to his cardiac anatomy but that is a risk for stroke is outweighed by his cardiac risk.  Also discussed this with anesthesia who will see him preoperatively tomorrow.  Plan right carotid endarterectomy on 08/11/2018

## 2018-08-10 LAB — GLUCOSE, CAPILLARY
Glucose-Capillary: 188 mg/dL — ABNORMAL HIGH (ref 70–99)
Glucose-Capillary: 285 mg/dL — ABNORMAL HIGH (ref 70–99)
Glucose-Capillary: 276 mg/dL — ABNORMAL HIGH (ref 70–99)
Glucose-Capillary: 137 mg/dL — ABNORMAL HIGH (ref 70–99)
Glucose-Capillary: 204 mg/dL — ABNORMAL HIGH (ref 70–99)

## 2018-08-10 LAB — CBC
HCT: 38.1 % — ABNORMAL LOW (ref 39.0–52.0)
Hemoglobin: 12.2 g/dL — ABNORMAL LOW (ref 13.0–17.0)
MCH: 26.9 pg (ref 26.0–34.0)
MCHC: 32 g/dL (ref 30.0–36.0)
MCV: 84.1 fL (ref 80.0–100.0)
Platelets: 511 10*3/uL — ABNORMAL HIGH (ref 150–400)
RBC: 4.53 MIL/uL (ref 4.22–5.81)
RDW: 17.3 % — ABNORMAL HIGH (ref 11.5–15.5)
WBC: 11.2 10*3/uL — ABNORMAL HIGH (ref 4.0–10.5)
nRBC: 0 % (ref 0.0–0.2)

## 2018-08-10 LAB — PROTIME-INR
INR: 2 — ABNORMAL HIGH (ref 0.8–1.2)
Prothrombin Time: 22.3 seconds — ABNORMAL HIGH (ref 11.4–15.2)

## 2018-08-10 LAB — HEPARIN LEVEL (UNFRACTIONATED): Heparin Unfractionated: 0.45 IU/mL (ref 0.30–0.70)

## 2018-08-10 MED ORDER — VANCOMYCIN HCL IN DEXTROSE 1-5 GM/200ML-% IV SOLN
1000.0000 mg | INTRAVENOUS | Status: DC
  Filled 2018-08-10 (×2): qty 200

## 2018-08-10 NOTE — Progress Notes (Signed)
Pharmacy Antibiotic Note  Oscar [name] is a 78 y.o. male admitted on 08/07/2018 with slurred speech and weakness. Pharmacy has been consulted for vancomycin dosing for surgical prophylaxis. Pt is scheduled for a carotid endarterectomy tomorrow at approximately at 11am.   Plan: Vancomycin 1gm IV x 1 tomorrow on call for procedure *Pharmacy will sign off  Height: 5\' 7"  (170.2 cm) Weight: 154 lb 5.2 oz (70 kg) IBW/kg (Calculated) : 66.1  Temp (24hrs), Avg:97.6 F (36.4 C), Min:97.4 F (36.3 C), Max:97.9 F (36.6 C)  Recent Labs  Lab 08/07/18 0806 08/07/18 0809 08/09/18 0804 08/10/18 0523  WBC 9.5  --  9.0 11.2*  CREATININE 1.32* 1.30* 1.17  --     Estimated Creatinine Clearance: 47.9 mL/min (by C-G formula based on SCr of 1.17 mg/dL).    Allergies  Allergen Reactions  . Penicillins Hives    Blisters on hands  . Statins     Joint and muscle pain and low energy.   Thank you for allowing pharmacy to be a part of this patient's care.  Braxen Dobek, Rande Lawman 08/10/2018 8:38 AM

## 2018-08-10 NOTE — Progress Notes (Signed)
Patient ID: Oscar [name], male   DOB: 12/29/1940, 78 y.o.   MRN: [id number] Comfortable this morning.  No neurologic deficits. Gust plan for right carotid endarterectomy tomorrow patient understands.  Will be mid morning.  Understands that he will not return to 3 W. following surgery

## 2018-08-10 NOTE — Progress Notes (Signed)
[place] for heparin Indication: atrial fibrillation  Patient Measurements: Height: 5\' 7"  (170.2 cm) Weight: 154 lb 5.2 oz (70 kg) IBW/kg (Calculated) : 66.1 Heparin Dosing Weight: 70 kg  Vital Signs: Temp: 97.4 F (36.3 C) (06/16 0824) Temp Source: Oral (06/16 0824) BP: 152/110 (06/16 0824)  Labs: Recent Labs    08/08/18 1033 08/09/18 0804 08/09/18 1930 08/10/18 0523  HGB  --  11.9*  --  12.2*  HCT  --  36.8*  --  38.1*  PLT  --  444*  --  511*  LABPROT 26.1* 21.4*  --  22.3*  INR 2.4* 1.9*  --  2.0*  HEPARINUNFRC  --   --  0.17* 0.45  CREATININE  --  1.17  --   --    Assessment: 78 yo male admitted with slurred speech and L-sided weakness. He is on warfarin prior to admission for AFib. He has had a prior TIA in the past. INR dropped to <2 yesterday so heparin started. INR is 2 today. Heparin level is therapeutic. CBC is stable and no bleeding noted. Pending CEA tomorrow.  Goal of Therapy:  Heparin level 0.3-0.7 units/ml Monitor platelets by anticoagulation protocol: Yes   Plan:  Continue Heparin gtt at 1350 units / hr Daily heparin level and CBC  Salome Arnt, PharmD, BCPS Please see AMION for all pharmacy numbers 08/10/2018 8:47 AM

## 2018-08-10 NOTE — Progress Notes (Signed)
PROGRESS NOTE  Oscar [name] MVH:[id number] DOB: 05-05-1940 DOA: 08/07/2018 PCP: Dione Housekeeper, MD  Brief History   78 y.o.malewith medical history significant ofatrial fibrillation on Coumadin, systolic CHF 84-13%,KGMWN in 2007,CAD, COPDon 4 L at night, DM type II, and HLD; who presented with complaints of left-sided weakness.Patient's wife noted some difficulty with them walking yesterday at 1:30 pm. Patient reports waking up this morning and feeling loopy with slurred speech and left-sided weakness. Upon EMS arrival patient reported patient's initial blood glucose 55. Patient symptoms reportedly resolved after receiving D10. Patient does not routinely check his blood sugars at home, but does not recall having any significant lows prior. He is on glipizide, Tradjenta, and metformin.  Patient had just been admitted into the hospital from 5/30-6/8,after presenting with suspected TIA found to have occluded right internal carotid artery stenosis, acute hypoxic respiratory failure, and new onset systolic congestive heart failure with EF 25 to 30%. Patient was diuresed and underwent cardiac catheter which revealed severe three-vessel coronary artery disease. Patient was evaluated and appears to have declined carotid endarterectomy by vascular surgery as well as CABG or high risk PCI.  ED Course:Patient presented as a code stroke.Dr. Leonel Ramsay neurology was consulted and initial CT scan of the brain showed no acute abnormalities. Vital signs relatively within normal limits. Labs revealed WBC 9.5, hemoglobin 11.8 with elevated RDW, 2, BUN 39, andcreatinine 1.32.Neurology recommended checking MRI of the brain. Vascular surgery Dr. Trula Slade hadformally consulted  Consultants  . Vascular surgery . Neurology  Procedures  . None  Antibiotics   Anti-infectives (From admission, onward)   Start     Dose/Rate Route Frequency Ordered Stop   08/11/18 1100  vancomycin (VANCOCIN)  IVPB 1000 mg/200 mL premix     1,000 mg 200 mL/hr over 60 Minutes Intravenous On call 08/10/18 0837 08/12/18 1100    .  Marland Kitchen   Subjective  The patient is resting comfortably. No new complaints.  Objective   Vitals:  Vitals:   08/10/18 1136 08/10/18 1600  BP: (!) 142/81 (!) 142/89  Pulse: 96 89  Resp:    Temp: 97.9 F (36.6 C) 98.7 F (37.1 C)  SpO2:      Exam:  Constitutional:  . The patient is awake, alert, and oriented x 3. No acute distress. Respiratory:  . CTA bilaterally, no w/r/r.  . Respiratory effort normal. No retractions or accessory muscle use Cardiovascular:  . RRR, no m/r/g . No LE extremity edema   . Normal pedal pulses Abdomen:  . Abdomen appears normal; no tenderness or masses . No hernias . No HSM Musculoskeletal:  . Digits/nails BUE: no clubbing, cyanosis, petechiae, infection . exam of joints, bones, muscles of at least one of following: head/neck, RUE, LUE, RLE, LLE   o strength and tone normal, no atrophy, no abnormal movements o No tenderness, masses o Normal ROM, no contractures  . gait and station Skin:  . No rashes, lesions, ulcers . palpation of skin: no induration or nodules Neurologic:  . CN 2-12 intact . Sensation all 4 extremities intact Psychiatric:  . Mental status o Mood, affect appropriate o Orientation to person, place, time  . judgment and insight appear intact     I have personally reviewed the following:   Today's Data  . CBC, Vitals  Scheduled Meds: . aspirin EC  81 mg Oral Daily  . digoxin  0.125 mg Oral Daily  . ezetimibe  10 mg Oral Daily  . fenofibrate  160 mg Oral Daily  .  furosemide  40 mg Oral Daily  . insulin aspart  0-5 Units Subcutaneous QHS  . insulin aspart  0-9 Units Subcutaneous TID WC  . pravastatin  80 mg Oral Daily  . sacubitril-valsartan  1 tablet Oral BID  . sodium chloride flush  3 mL Intravenous Q12H  . spironolactone  25 mg Oral Daily   Continuous Infusions: . heparin 1,350 Units/hr  (08/09/18 2057)  . [START ON 08/11/2018] vancomycin      Principal Problem:   TIA (transient ischemic attack) Active Problems:   ATRIAL FIBRILLATION   Carotid artery stenosis, symptomatic, right   Dyslipidemia   Normocytic normochromic anemia   COPD without exacerbation (HCC)   AKI (acute kidney injury) ([place])   Type 2 diabetes mellitus with hypoglycemia without coma ([place])   LOS: 2 days    A & P  TIA secondary to symptomatic right carotid stenosis. Patient was found to have right upper extremity weakness which lasted for 30 minutes. During his last hospital stay 2 weeks ago he was told he needed carotid endarterectomy but he refused and wanted to have it done as an outpatient. He also had a cardiac cath during his last hospital stay showed severe three-vessel disease but he did not want any cardiac work-up CABG or PCI. Seen by vascular surgery plan for surgery on Monday. Patient had recent TIA work-up with LDL of 55 A1c of 6.9 right carotid stenosis 60 to 79%. The patient is at risk for recurrent stroke without CEA and needs continued hospital stay. Plan is for CEA on 08/11/2018  Chronic atrial fibrillation on Coumadin rate controlled on digoxin and Coreg. Patient having on and off.'s of bradycardia. I will hold Coreg andcheck a digoxin level. On heparin bridge in preparation for surgery Coumadin on hold.   Hyperlipidemia: The patient's last lipids were drawn on  Jul 25, 2018. At that time total cholesterol  Was 95; HDL was 17; LDL was 55; triglycerides were 113. He is on pravastatin and Zetia and fenofibrate.  Type 2 diabetes: Hypoglycemia has resolved and D5 stopped. Oral hypoglycemic agents remain on hold. The patient takes Tradjenta, metformin, repaglinide, and glipizide at home. His last hemoglobin A1c is 6.9 on May 31.   AKI: The patient's last creatinine noted is 0.99 he was admitted with a creatinine of 1.32 he was given gentle IV fluids and repeat creatinine 1.30. He  is on Entresto, Lasix, and Aldactone. These medications may contribute to the patient's elevated creatinine.Monitor closely.  Hyponatremia:  Monitor.  Systolic heart failure with ejection fraction 30% on Jul 25, 2018. Continue lasix and aldactone and entresto.  Coronary artery disease with three-vessel CAD: The patient does not want PCI or CABG. He was noted to be high risk candidate for CABG.  Essential hypertension: improved after holding bp meds yesterday.monitor.  COPD: Noted and stable. The patient uses 4 L of oxygen at night at home. PRN albuterol.  Chronic iron deficiency anemia:  Hemoglobin stable at 12.2.  This patient needs continuous inpatient monitoring due to symptomatic carotid artery stenosis needing carotid endarterectomy .he is at high risk for recurrent stroke.  I have seen and examined this patient myself. I have spent 35 minutes in his evaluation and care.  DVT prophylaxis:heparin Code Status:Full Family Communication:dw rose wife Disposition Plan:Pending carotid endarterectomy Consults called:Neurology, vascular surgery  Tiara Bartoli, DO [facility] Direct contact: see [email or web address]  7PM-7AM contact night coverage as above 08/10/2018, 7:33 PM  LOS: 2 days

## 2018-08-11 ENCOUNTER — Encounter (HOSPITAL_COMMUNITY)
Admission: EM | Disposition: A | Discharge status: Home / Self Care | Payer: Self-pay | Source: Home / Self Care | Attending: Internal Medicine

## 2018-08-11 ENCOUNTER — Encounter (HOSPITAL_COMMUNITY): Payer: Self-pay | Admitting: Surgery

## 2018-08-11 ENCOUNTER — Inpatient Hospital Stay (HOSPITAL_COMMUNITY): Payer: Medicare Other | Admitting: Anesthesiology

## 2018-08-11 DIAGNOSIS — I6521 Occlusion and stenosis of right carotid artery: Secondary | ICD-10-CM

## 2018-08-11 HISTORY — PX: PATCH ANGIOPLASTY: SHX6230

## 2018-08-11 HISTORY — PX: ENDARTERECTOMY: SHX5162

## 2018-08-11 LAB — CBC WITH DIFFERENTIAL/PLATELET
Abs Immature Granulocytes: 0.11 10*3/uL — ABNORMAL HIGH (ref 0.00–0.07)
Basophils Absolute: 0.1 10*3/uL (ref 0.0–0.1)
Basophils Relative: 1 %
Eosinophils Absolute: 0.1 10*3/uL (ref 0.0–0.5)
Eosinophils Relative: 1 %
HCT: 36.6 % — ABNORMAL LOW (ref 39.0–52.0)
Hemoglobin: 11.7 g/dL — ABNORMAL LOW (ref 13.0–17.0)
Immature Granulocytes: 1 %
Lymphocytes Relative: 23 %
Lymphs Abs: 2.2 10*3/uL (ref 0.7–4.0)
MCH: 26.7 pg (ref 26.0–34.0)
MCHC: 32 g/dL (ref 30.0–36.0)
MCV: 83.6 fL (ref 80.0–100.0)
Monocytes Absolute: 0.9 10*3/uL (ref 0.1–1.0)
Monocytes Relative: 10 %
Neutro Abs: 6.4 10*3/uL (ref 1.7–7.7)
Neutrophils Relative %: 64 %
Platelets: 549 10*3/uL — ABNORMAL HIGH (ref 150–400)
RBC: 4.38 MIL/uL (ref 4.22–5.81)
RDW: 17.7 % — ABNORMAL HIGH (ref 11.5–15.5)
WBC: 9.8 10*3/uL (ref 4.0–10.5)
nRBC: 0.2 % (ref 0.0–0.2)

## 2018-08-11 LAB — GLUCOSE, CAPILLARY
Glucose-Capillary: 149 mg/dL — ABNORMAL HIGH (ref 70–99)
Glucose-Capillary: 136 mg/dL — ABNORMAL HIGH (ref 70–99)
Glucose-Capillary: 152 mg/dL — ABNORMAL HIGH (ref 70–99)
Glucose-Capillary: 293 mg/dL — ABNORMAL HIGH (ref 70–99)

## 2018-08-11 LAB — TYPE AND SCREEN
ABO/RH(D): A POS
Antibody Screen: NEGATIVE

## 2018-08-11 LAB — COMPREHENSIVE METABOLIC PANEL
ALT: 32 U/L (ref 0–44)
AST: 31 U/L (ref 15–41)
Albumin: 3.3 g/dL — ABNORMAL LOW (ref 3.5–5.0)
Alkaline Phosphatase: 44 U/L (ref 38–126)
Anion gap: 12 (ref 5–15)
BUN: 36 mg/dL — ABNORMAL HIGH (ref 8–23)
CO2: 18 mmol/L — ABNORMAL LOW (ref 22–32)
Calcium: 9.1 mg/dL (ref 8.9–10.3)
Chloride: 106 mmol/L (ref 98–111)
Creatinine, Ser: 1.4 mg/dL — ABNORMAL HIGH (ref 0.61–1.24)
GFR calc Af Amer: 55 mL/min — ABNORMAL LOW (ref >60)
GFR calc non Af Amer: 47 mL/min — ABNORMAL LOW (ref >60)
Glucose, Bld: 145 mg/dL — ABNORMAL HIGH (ref 70–99)
Potassium: 4.1 mmol/L (ref 3.5–5.1)
Sodium: 136 mmol/L (ref 135–145)
Total Bilirubin: 2 mg/dL — ABNORMAL HIGH (ref 0.3–1.2)
Total Protein: 5.8 g/dL — ABNORMAL LOW (ref 6.5–8.1)

## 2018-08-11 LAB — MRSA PCR SCREENING: MRSA by PCR: NEGATIVE

## 2018-08-11 LAB — PROTIME-INR
INR: 1.9 — ABNORMAL HIGH (ref 0.8–1.2)
Prothrombin Time: 21.4 seconds — ABNORMAL HIGH (ref 11.4–15.2)

## 2018-08-11 LAB — HEPARIN LEVEL (UNFRACTIONATED): Heparin Unfractionated: 0.35 IU/mL (ref 0.30–0.70)

## 2018-08-11 SURGERY — ENDARTERECTOMY, CAROTID
Anesthesia: General | Site: Neck | Laterality: Right

## 2018-08-11 MED ORDER — ROCURONIUM BROMIDE 50 MG/5ML IV SOSY
PREFILLED_SYRINGE | INTRAVENOUS | Status: DC | PRN
  Administered 2018-08-11 (×2): 50 mg via INTRAVENOUS

## 2018-08-11 MED ORDER — ALUM & MAG HYDROXIDE-SIMETH 200-200-20 MG/5ML PO SUSP: 15.0000 mL | ORAL | Status: DC | PRN

## 2018-08-11 MED ORDER — OXYCODONE-ACETAMINOPHEN 5-325 MG PO TABS: 1.0000 | ORAL_TABLET | ORAL | Status: DC | PRN

## 2018-08-11 MED ORDER — HEPARIN (PORCINE) 25000 UT/250ML-% IV SOLN
1350.0000 [IU]/h | INTRAVENOUS | Status: DC
  Administered 2018-08-12: 1350 [IU]/h via INTRAVENOUS
  Filled 2018-08-11: qty 250

## 2018-08-11 MED ORDER — ETOMIDATE 2 MG/ML IV SOLN
INTRAVENOUS | Status: DC | PRN
  Administered 2018-08-11: 20 mg via INTRAVENOUS

## 2018-08-11 MED ORDER — ETOMIDATE 2 MG/ML IV SOLN
INTRAVENOUS | Status: AC
  Filled 2018-08-11: qty 10

## 2018-08-11 MED ORDER — ONDANSETRON HCL 4 MG/2ML IJ SOLN
INTRAMUSCULAR | Status: DC | PRN
  Administered 2018-08-11: 4 mg via INTRAVENOUS

## 2018-08-11 MED ORDER — FENTANYL CITRATE (PF) 250 MCG/5ML IJ SOLN
INTRAMUSCULAR | Status: AC
  Filled 2018-08-11: qty 5

## 2018-08-11 MED ORDER — OXYCODONE HCL 5 MG PO TABS: 5.0000 mg | ORAL_TABLET | Freq: Once | ORAL | Status: DC | PRN

## 2018-08-11 MED ORDER — MORPHINE SULFATE (PF) 2 MG/ML IV SOLN: 2.0000 mg | INTRAVENOUS | Status: DC | PRN

## 2018-08-11 MED ORDER — SODIUM CHLORIDE 0.9 % IV SOLN
INTRAVENOUS | Status: DC
  Administered 2018-08-11: 16:00:00 via INTRAVENOUS

## 2018-08-11 MED ORDER — EPHEDRINE 5 MG/ML INJ
INTRAVENOUS | Status: AC
  Filled 2018-08-11: qty 10

## 2018-08-11 MED ORDER — MAGNESIUM SULFATE 2 GM/50ML IV SOLN: 2.0000 g | Freq: Every day | INTRAVENOUS | Status: DC | PRN

## 2018-08-11 MED ORDER — SODIUM CHLORIDE 0.9 % IV SOLN
INTRAVENOUS | Status: DC | PRN
  Administered 2018-08-11: 500 mL

## 2018-08-11 MED ORDER — PHENOL 1.4 % MT LIQD: 1.0000 | OROMUCOSAL | Status: DC | PRN

## 2018-08-11 MED ORDER — 0.9 % SODIUM CHLORIDE (POUR BTL) OPTIME
TOPICAL | Status: DC | PRN
  Administered 2018-08-11 (×2): 1000 mL

## 2018-08-11 MED ORDER — DOCUSATE SODIUM 100 MG PO CAPS
100.0000 mg | ORAL_CAPSULE | Freq: Every day | ORAL | Status: DC
  Administered 2018-08-12: 100 mg via ORAL
  Filled 2018-08-11: qty 1

## 2018-08-11 MED ORDER — ONDANSETRON HCL 4 MG/2ML IJ SOLN: 4.0000 mg | Freq: Four times a day (QID) | INTRAMUSCULAR | Status: DC | PRN

## 2018-08-11 MED ORDER — PANTOPRAZOLE SODIUM 40 MG PO TBEC
40.0000 mg | DELAYED_RELEASE_TABLET | Freq: Every day | ORAL | Status: DC
  Administered 2018-08-12: 40 mg via ORAL
  Filled 2018-08-11: qty 1

## 2018-08-11 MED ORDER — LABETALOL HCL 5 MG/ML IV SOLN
10.0000 mg | INTRAVENOUS | Status: DC | PRN
  Administered 2018-08-11 (×2): 5 mg via INTRAVENOUS

## 2018-08-11 MED ORDER — EPHEDRINE SULFATE-NACL 50-0.9 MG/10ML-% IV SOSY
PREFILLED_SYRINGE | INTRAVENOUS | Status: DC | PRN
  Administered 2018-08-11: 10 mg via INTRAVENOUS

## 2018-08-11 MED ORDER — POTASSIUM CHLORIDE CRYS ER 20 MEQ PO TBCR: 20.0000 meq | EXTENDED_RELEASE_TABLET | Freq: Every day | ORAL | Status: DC | PRN

## 2018-08-11 MED ORDER — FENTANYL CITRATE (PF) 100 MCG/2ML IJ SOLN
INTRAMUSCULAR | Status: DC | PRN
  Administered 2018-08-11: 75 ug via INTRAVENOUS

## 2018-08-11 MED ORDER — VANCOMYCIN HCL IN DEXTROSE 1-5 GM/200ML-% IV SOLN
1000.0000 mg | Freq: Two times a day (BID) | INTRAVENOUS | Status: DC
  Administered 2018-08-12: 1000 mg via INTRAVENOUS
  Filled 2018-08-11 (×2): qty 200

## 2018-08-11 MED ORDER — LABETALOL HCL 5 MG/ML IV SOLN
INTRAVENOUS | Status: AC
  Filled 2018-08-11: qty 4

## 2018-08-11 MED ORDER — SODIUM CHLORIDE 0.9 % IV SOLN
INTRAVENOUS | Status: AC
  Filled 2018-08-11: qty 1.2

## 2018-08-11 MED ORDER — FENTANYL CITRATE (PF) 100 MCG/2ML IJ SOLN: 25.0000 ug | INTRAMUSCULAR | Status: DC | PRN

## 2018-08-11 MED ORDER — HYDRALAZINE HCL 20 MG/ML IJ SOLN: 5.0000 mg | INTRAMUSCULAR | Status: DC | PRN

## 2018-08-11 MED ORDER — LIDOCAINE 2% (20 MG/ML) 5 ML SYRINGE
INTRAMUSCULAR | Status: DC | PRN
  Administered 2018-08-11: 25 mg via INTRAVENOUS

## 2018-08-11 MED ORDER — ONDANSETRON HCL 4 MG/2ML IJ SOLN
INTRAMUSCULAR | Status: AC
  Filled 2018-08-11: qty 2

## 2018-08-11 MED ORDER — DEXAMETHASONE SODIUM PHOSPHATE 10 MG/ML IJ SOLN
INTRAMUSCULAR | Status: AC
  Filled 2018-08-11: qty 1

## 2018-08-11 MED ORDER — ROCURONIUM BROMIDE 10 MG/ML (PF) SYRINGE
PREFILLED_SYRINGE | INTRAVENOUS | Status: AC
  Filled 2018-08-11: qty 10

## 2018-08-11 MED ORDER — METOPROLOL TARTRATE 5 MG/5ML IV SOLN: 2.0000 mg | INTRAVENOUS | Status: DC | PRN

## 2018-08-11 MED ORDER — SODIUM CHLORIDE 0.9 % IV SOLN: 500.0000 mL | Freq: Once | INTRAVENOUS | Status: DC | PRN

## 2018-08-11 MED ORDER — GUAIFENESIN-DM 100-10 MG/5ML PO SYRP: 15.0000 mL | ORAL_SOLUTION | ORAL | Status: DC | PRN

## 2018-08-11 MED ORDER — OXYCODONE HCL 5 MG/5ML PO SOLN: 5.0000 mg | Freq: Once | ORAL | Status: DC | PRN

## 2018-08-11 MED ORDER — DEXAMETHASONE SODIUM PHOSPHATE 10 MG/ML IJ SOLN
INTRAMUSCULAR | Status: DC | PRN
  Administered 2018-08-11: 10 mg via INTRAVENOUS

## 2018-08-11 MED ORDER — HEPARIN SODIUM (PORCINE) 1000 UNIT/ML IJ SOLN
INTRAMUSCULAR | Status: AC
  Filled 2018-08-11: qty 1

## 2018-08-11 MED ORDER — PROPOFOL 10 MG/ML IV BOLUS
INTRAVENOUS | Status: AC
  Filled 2018-08-11: qty 20

## 2018-08-11 MED ORDER — HEPARIN SODIUM (PORCINE) 1000 UNIT/ML IJ SOLN
INTRAMUSCULAR | Status: DC | PRN
  Administered 2018-08-11: 6000 [IU] via INTRAVENOUS

## 2018-08-11 MED ORDER — LACTATED RINGERS IV SOLN
INTRAVENOUS | Status: DC | PRN
  Administered 2018-08-11: 11:00:00 via INTRAVENOUS

## 2018-08-11 MED ORDER — ESMOLOL HCL 100 MG/10ML IV SOLN
INTRAVENOUS | Status: AC
  Filled 2018-08-11: qty 10

## 2018-08-11 MED ORDER — SODIUM CHLORIDE 0.9 % IV SOLN
INTRAVENOUS | Status: DC | PRN
  Administered 2018-08-11: 11:00:00 40 ug/min via INTRAVENOUS

## 2018-08-11 MED ORDER — PROTAMINE SULFATE 10 MG/ML IV SOLN
INTRAVENOUS | Status: AC
  Filled 2018-08-11: qty 5

## 2018-08-11 MED ORDER — SUGAMMADEX SODIUM 200 MG/2ML IV SOLN
INTRAVENOUS | Status: DC | PRN
  Administered 2018-08-11: 140 mg via INTRAVENOUS

## 2018-08-11 MED ORDER — LIDOCAINE 2% (20 MG/ML) 5 ML SYRINGE
INTRAMUSCULAR | Status: AC
  Filled 2018-08-11: qty 5

## 2018-08-11 MED ORDER — LIDOCAINE HCL (PF) 1 % IJ SOLN
INTRAMUSCULAR | Status: AC
  Filled 2018-08-11: qty 30

## 2018-08-11 MED ORDER — SODIUM CHLORIDE 0.9 % IV SOLN
0.0125 ug/kg/min | INTRAVENOUS | Status: AC
  Administered 2018-08-11: .1 ug/kg/min via INTRAVENOUS
  Filled 2018-08-11: qty 2000

## 2018-08-11 MED ORDER — PROTAMINE SULFATE 10 MG/ML IV SOLN
INTRAVENOUS | Status: DC | PRN
  Administered 2018-08-11: 50 mg via INTRAVENOUS

## 2018-08-11 SURGICAL SUPPLY — 43 items
ADH SKN CLS APL DERMABOND .7 (GAUZE/BANDAGES/DRESSINGS) ×2
CANISTER SUCT 3000ML PPV (MISCELLANEOUS) ×4 IMPLANT
CANNULA VESSEL 3MM 2 BLNT TIP (CANNULA) ×8 IMPLANT
CATH ROBINSON RED A/P 18FR (CATHETERS) ×4 IMPLANT
CLIP LIGATING EXTRA MED SLVR (CLIP) ×4 IMPLANT
CLIP LIGATING EXTRA SM BLUE (MISCELLANEOUS) ×4 IMPLANT
COVER WAND RF STERILE (DRAPES) ×4 IMPLANT
CRADLE DONUT ADULT HEAD (MISCELLANEOUS) ×4 IMPLANT
DECANTER SPIKE VIAL GLASS SM (MISCELLANEOUS) IMPLANT
DERMABOND ADVANCED (GAUZE/BANDAGES/DRESSINGS) ×2
DERMABOND ADVANCED .7 DNX12 (GAUZE/BANDAGES/DRESSINGS) ×2 IMPLANT
DRAIN HEMOVAC 1/8 X 5 (WOUND CARE) IMPLANT
ELECT REM PT RETURN 9FT ADLT (ELECTROSURGICAL) ×4
ELECTRODE REM PT RTRN 9FT ADLT (ELECTROSURGICAL) ×2 IMPLANT
EVACUATOR SILICONE 100CC (DRAIN) IMPLANT
GLOVE BIO SURGEON STRL SZ 6.5 (GLOVE) ×2 IMPLANT
GLOVE BIO SURGEONS STRL SZ 6.5 (GLOVE) ×2
GLOVE BIOGEL PI IND STRL 6.5 (GLOVE) IMPLANT
GLOVE BIOGEL PI INDICATOR 6.5 (GLOVE) ×4
GLOVE SS BIOGEL STRL SZ 7.5 (GLOVE) ×2 IMPLANT
GLOVE SUPERSENSE BIOGEL SZ 7.5 (GLOVE) ×2
GOWN STRL REUS W/ TWL LRG LVL3 (GOWN DISPOSABLE) ×6 IMPLANT
GOWN STRL REUS W/TWL LRG LVL3 (GOWN DISPOSABLE) ×12
KIT BASIN OR (CUSTOM PROCEDURE TRAY) ×4 IMPLANT
KIT SHUNT ARGYLE CAROTID ART 6 (VASCULAR PRODUCTS) IMPLANT
KIT TURNOVER KIT B (KITS) ×4 IMPLANT
NEEDLE 22X1 1/2 (OR ONLY) (NEEDLE) IMPLANT
NS IRRIG 1000ML POUR BTL (IV SOLUTION) ×8 IMPLANT
PACK CAROTID (CUSTOM PROCEDURE TRAY) ×4 IMPLANT
PAD ARMBOARD 7.5X6 YLW CONV (MISCELLANEOUS) ×8 IMPLANT
PATCH HEMASHIELD 8X75 (Vascular Products) ×2 IMPLANT
SHUNT CAROTID BYPASS 10 (VASCULAR PRODUCTS) ×2 IMPLANT
SHUNT CAROTID BYPASS 12FRX15.5 (VASCULAR PRODUCTS) IMPLANT
SUT ETHILON 3 0 PS 1 (SUTURE) IMPLANT
SUT PROLENE 6 0 CC (SUTURE) ×6 IMPLANT
SUT SILK 3 0 (SUTURE)
SUT SILK 3-0 18XBRD TIE 12 (SUTURE) IMPLANT
SUT VIC AB 3-0 SH 27 (SUTURE) ×8
SUT VIC AB 3-0 SH 27X BRD (SUTURE) ×4 IMPLANT
SUT VICRYL 4-0 PS2 18IN ABS (SUTURE) ×4 IMPLANT
SYR CONTROL 10ML LL (SYRINGE) IMPLANT
TOWEL GREEN STERILE (TOWEL DISPOSABLE) ×4 IMPLANT
WATER STERILE IRR 1000ML POUR (IV SOLUTION) ×4 IMPLANT

## 2018-08-11 NOTE — Transfer of Care (Signed)
Immediate Anesthesia Transfer of Care Note  Patient: Oscar [name]  Procedure(s) Performed: ENDARTERECTOMY CAROTID RIGHT (Right ) PATCH ANGIOPLAST USING HEMASHIELD PLATINUM FINESSE (Right Neck)  Patient Location: PACU  Anesthesia Type:General  Level of Consciousness: awake and patient cooperative  Airway & Oxygen Therapy: Patient Spontanous Breathing  Post-op Assessment: Report given to RN and Post -op Vital signs reviewed and stable  Post vital signs: Reviewed and stable  Last Vitals:  Vitals Value Taken Time  BP 124/78 08/11/18 1303  Temp 36.5 C 08/11/18 1303  Pulse 100 08/11/18 1306  Resp 23 08/11/18 1306  SpO2 97 % 08/11/18 1306  Vitals shown include unvalidated device data.  Last Pain:  Vitals:   08/11/18 1016  TempSrc:   PainSc: 0-No pain         Complications: No apparent anesthesia complications

## 2018-08-11 NOTE — Anesthesia Preprocedure Evaluation (Signed)
Anesthesia Evaluation  Patient identified by MRN, date of birth, ID band Patient awake    Reviewed: Allergy & Precautions, H&P , NPO status , Patient's Chart, lab work & pertinent test results  Airway Mallampati: II   Neck ROM: full    Dental   Pulmonary shortness of breath, COPD, former smoker,    breath sounds clear to auscultation       Cardiovascular + CAD and +CHF  + dysrhythmias Atrial Fibrillation  Rhythm:regular Rate:Normal  Pt has severe 3v disease and has declined the recommended CABG.  EF 25-30%   Neuro/Psych TIA   GI/Hepatic   Endo/Other  diabetes  Renal/GU Renal InsufficiencyRenal disease     Musculoskeletal   Abdominal   Peds  Hematology  (+) Blood dyscrasia, anemia ,   Anesthesia Other Findings   Reproductive/Obstetrics                             Anesthesia Physical Anesthesia Plan  ASA: IV  Anesthesia Plan: General   Post-op Pain Management:    Induction: Intravenous  PONV Risk Score and Plan: 2 and Ondansetron, Dexamethasone and Treatment may vary due to age or medical condition  Airway Management Planned: Oral ETT  Additional Equipment: Arterial line  Intra-op Plan:   Post-operative Plan:   Informed Consent: I have reviewed the patients History and Physical, chart, labs and discussed the procedure including the risks, benefits and alternatives for the proposed anesthesia with the patient or authorized representative who has indicated his/her understanding and acceptance.       Plan Discussed with: CRNA, Anesthesiologist and Surgeon  Anesthesia Plan Comments:         Anesthesia Quick Evaluation

## 2018-08-11 NOTE — Progress Notes (Signed)
  Day of Surgery Note    Subjective:  Says he needs to use the urinal, otherwise, no complaints   Vitals:   08/11/18 0744 08/11/18 1303  BP: (!) 150/97 124/78  Pulse: 100 (!) 121  Resp: 16 17  Temp: 97.8 F (36.6 C) 97.7 F (36.5 C)  SpO2: 95% 97%    Incisions:   Clean and dry without hematoma Extremities:  Moving all extremities equally Cardiac:  irregular Lungs:  Non labored Neuro:  In tact; tongue is midline   Assessment/Plan:  This is a 78 y.o. male who is s/p  Right carotid endarterectomy  -pt doing well in recovery and neuro in tact -pt on heparin/coumadin for afib-will restart heparin gtt at 0600 tomorrow morning. If pt doing well, anticipate discharge home tomorrow from vascular standpoint with Lovenox bridge back to coumadin. -to 4 east later this afternoon.   Leontine Locket, PA-C 08/11/2018 1:30 PM [phone]

## 2018-08-11 NOTE — Plan of Care (Signed)
S/p endarectomy. Informed of frequent assessment.

## 2018-08-11 NOTE — Plan of Care (Signed)

## 2018-08-11 NOTE — Anesthesia Procedure Notes (Signed)
Procedure Name: Intubation Date/Time: 08/11/2018 11:03 AM Performed by: Lance Coon, CRNA Pre-anesthesia Checklist: Patient identified, Emergency Drugs available, Suction available, Timeout performed and Patient being monitored Patient Re-evaluated:Patient Re-evaluated prior to induction Oxygen Delivery Method: Circle system utilized Preoxygenation: Pre-oxygenation with 100% oxygen Induction Type: IV induction Ventilation: Mask ventilation with difficulty Laryngoscope Size: Miller and 3 Grade View: Grade I Tube type: Oral Tube size: 7.5 mm Number of attempts: 1 Airway Equipment and Method: Stylet Placement Confirmation: ETT inserted through vocal cords under direct vision,  positive ETCO2 and breath sounds checked- equal and bilateral Secured at: 21 cm Tube secured with: Tape Dental Injury: Teeth and Oropharynx as per pre-operative assessment

## 2018-08-11 NOTE — Discharge Instructions (Signed)
° °  [facility] ° °Discharge Instructions °  °Carotid Endarterectomy (CEA) ° °Please refer to the following instructions for your post-procedure care. Your surgeon or physician assistant will discuss any changes with you. ° °Activity ° °You are encouraged to walk as much as you can. You can slowly return to normal activities but must avoid strenuous activity and heavy lifting until your doctor tell you it's okay. Avoid activities such as vacuuming or swinging a golf club. You can drive after one week if you are comfortable and you are no longer taking prescription pain medications. It is normal to feel tired for serval weeks after your surgery. It is also normal to have difficulty with sleep habits, eating, and bowel movements after surgery. These will go away with time. ° °Bathing/Showering ° °Shower daily after you go home. Do not soak in a bathtub, hot tub, or swim until the incision heals completely. ° °Incision Care ° °Shower every day. Clean your incision with mild soap and water. Pat the area dry with a clean towel. You do not need a bandage unless otherwise instructed. Do not apply any ointments or creams to your incision. You may have skin glue on your incision. Do not peel it off. It will come off on its own in about one week. Your incision may feel thickened and raised for several weeks after your surgery. This is normal and the skin will soften over time.  ° °For Men Only: It's okay to shave around the incision but do not shave the incision itself for 2 weeks. It is common to have numbness under your chin that could last for several months. ° °Diet ° °Resume your normal diet. There are no special food restrictions following this procedure. A low fat/low cholesterol diet is recommended for all patients with vascular disease. In order to heal from your surgery, it is CRITICAL to get adequate nutrition. Your body requires vitamins, minerals, and protein. Vegetables are the  best source of vitamins and minerals. Vegetables also provide the perfect balance of protein. Processed food has little nutritional value, so try to avoid this. ° °Medications ° °Resume taking all of your medications unless your doctor or physician assistant tells you not to. If your incision is causing pain, you may take over-the- counter pain relievers such as acetaminophen (Tylenol). If you were prescribed a stronger pain medication, please be aware these medications can cause nausea and constipation. Prevent nausea by taking the medication with a snack or meal. Avoid constipation by drinking plenty of fluids and eating foods with a high amount of fiber, such as fruits, vegetables, and grains.  °Do not take Tylenol if you are taking prescription pain medications. ° °Follow Up ° °Our office will schedule a follow up appointment 2-3 weeks following discharge. ° °Please call us immediately for any of the following conditions ° °Increased pain, redness, drainage (pus) from your incision site. °Fever of 101 degrees or higher. °If you should develop stroke (slurred speech, difficulty swallowing, weakness on one side of your body, loss of vision) you should call 911 and go to the nearest emergency room. ° °Reduce your risk of vascular disease: ° °Stop smoking. If you would like help call [facility] at 1-800-QUIT-NOW (1-800-784-8669) or [place] at [phone]. °Manage your cholesterol °Maintain a desired weight °Control your diabetes °Keep your blood pressure down ° °If you have any questions, please call the office at [phone]. ° °

## 2018-08-11 NOTE — Progress Notes (Signed)
Patient off unit for procedure.

## 2018-08-11 NOTE — Op Note (Signed)
   OPERATIVE REPORT  DATE OF SURGERY: 08/11/2018  PATIENT: Oscar [name], 78 y.o. male MRN: [id number]  DOB: [birth year]  PRE-OPERATIVE DIAGNOSIS: Right Carotid Stenosis, Symptomatic  POST-OPERATIVE DIAGNOSIS:  Same  PROCEDURE:  Right Carotid Endarterectomy with Dacron Patch Angioplasty  SURGEON:  Curt Jews, M.D.  PHYSICIAN ASSISTANT: Rhyne PAC  ANESTHESIA:   general  EBL: Less than 200 ml  Total I/O In: 1000 [I.V.:1000] Out: 25 [Blood:25]  BLOOD ADMINISTERED: none  DRAINS: none   SPECIMEN: none  COUNTS CORRECT:  YES  PLAN OF CARE: Admit to inpatient   PATIENT DISPOSITION:  PACU - hemodynamically stable and neurologically intact.  PROCEDURE DETAILS: The patient was taken to the operating room placed in supine position.  General anesthesia was administered.  The neck was prepped and draped in the usual sterile fashion.  An incision was made anterior to the sternocleidomastoid and carried down through the platysma with electrocautery.  The sternocleidomastoid was reflected posteriorly and the carotid sheath was opened.  The facial vein was ligated with 2-0 silk ties and divided.  The common carotid artery was encircled with an umbilical tape and Rummel tourniquet.  The vagus nerve was identified and preserved.  Dissection was continued onto the carotid bifurcation.  The superior thyroid artery was encircled with a 2-0 silk Potts tie.  The external carotid was encircled with a blue vessel loop and the internal carotid was encircled with an umbilical tape and Rummel tourniquet.  The hypoglossal nerve was identified and preserved.  The patient was given systemic heparin and after adequate circulation time, the internal, external and common carotid arteries were occluded with vascular clamps.  The common carotid artery was opened with an 11 blade and extended  longitudinally with Potts scissors.  A 10 shunt was passed up the internal carotid and allowed to backbleed.  It was then  passed down the common carotid where it was secured with Rummel tourniquet.  The endarterectomy was begun on the common carotid artery and the plaque was divided proximally with Potts scissors.  The endarterectomy was continued onto the bifurcation.  The external carotid was endarterectomized with an eversion technique and the internal carotid was endarterectomized in an open fashion.  Remaining atheromatous debris was removed from the endarterectomy plane.  A Finesse Hemashield Dacron patch was brought onto the field and was sewn as a patch angioplasty with a running 6-0 Prolene suture.  Prior to completion of the closure the shunt was removed and the usual flushing maneuvers were undertaken.  The anastomosis was completed and flow was restored first to the external and then the internal carotid artery.  Excellent flow characteristics were noted with hand-held Doppler in the internal and external carotid arteries.  The patient was given 50 mg of protamine to reverse the heparin.  The wounds were irrigated with saline.  Hemostasis was obtained with electrocautery.  The wounds were closed with 3-0 Vicryl to reapproximate the sternocleidomastoid over the carotid sheath.  The platysma was lysed with a running 3-0 Vicryl suture.  The skin was closed with a 4-0 subcuticular Vicryl stitch.  Dermabond was applied.  The patient was awakened neurologically intact in the operating room and transferred to the recovery room in stable condition   Curt Jews, M.D. 08/11/2018 1:09 PM

## 2018-08-11 NOTE — Progress Notes (Signed)
[place] for heparin Indication: atrial fibrillation  Patient Measurements: Height: 5\' 7"  (170.2 cm) Weight: 154 lb 5.2 oz (70 kg) IBW/kg (Calculated) : 66.1 Heparin Dosing Weight: 70 kg  Vital Signs: Temp: 97.8 F (36.6 C) (06/17 0345) Temp Source: Oral (06/17 0345) BP: 150/97 (06/17 0744) Pulse Rate: 100 (06/17 0744)  Labs: Recent Labs    08/09/18 0804 08/09/18 1930 08/10/18 0523 08/11/18 0520  HGB 11.9*  --  12.2* 11.7*  HCT 36.8*  --  38.1* 36.6*  PLT 444*  --  511* 549*  LABPROT 21.4*  --  22.3* 21.4*  INR 1.9*  --  2.0* 1.9*  HEPARINUNFRC  --  0.17* 0.45 0.35  CREATININE 1.17  --   --  1.40*   Assessment: 78 yo male admitted with slurred speech and L-sided weakness. He is on warfarin prior to admission for AFib. He has had a prior TIA in the past. INR dropped to <2 on 6/15, so heparin started. INR remains 1.9 today. Heparin level is therapeutic. CBC is stable, and no bleeding issues documented. Pending CEA today.  Goal of Therapy:  Heparin level 0.3-0.7 units/ml Monitor platelets by anticoagulation protocol: Yes   Plan:  Continue Heparin gtt at 1350 units / hr Monitor daily heparin level and CBC, s/sx bleeding F/u resuming warfarin as appropriate post-op CEA  Elicia Lamp, PharmD, BCPS Please check AMION for all [place] contact numbers Clinical Pharmacist 08/11/2018 8:15 AM

## 2018-08-11 NOTE — Progress Notes (Signed)
PROGRESS NOTE  Oscar [name] SAY:[id number] DOB: 1940/12/23 DOA: 08/07/2018 PCP: Dione Housekeeper, MD  Brief History   78 y.o.malewith medical history significant ofatrial fibrillation on Coumadin, systolic CHF 23-55%,DDUKG in 2007,CAD, COPDon 4 L at night, DM type II, and HLD; who presented with complaints of left-sided weakness.Patient's wife noted some difficulty with them walking yesterday at 1:30 pm. Patient reports waking up this morning and feeling loopy with slurred speech and left-sided weakness. Upon EMS arrival patient reported patient's initial blood glucose 55. Patient symptoms reportedly resolved after receiving D10. Patient does not routinely check his blood sugars at home, but does not recall having any significant lows prior. He is on glipizide, Tradjenta, and metformin.  Patient had just been admitted into the hospital from 5/30-6/8,after presenting with suspected TIA found to have occluded right internal carotid artery stenosis, acute hypoxic respiratory failure, and new onset systolic congestive heart failure with EF 25 to 30%. Patient was diuresed and underwent cardiac catheter which revealed severe three-vessel coronary artery disease. Patient was evaluated and appears to have declined carotid endarterectomy by vascular surgery as well as CABG or high risk PCI.  ED Course:Patient presented as a code stroke.Dr. Leonel Ramsay neurology was consulted and initial CT scan of the brain showed no acute abnormalities. Vital signs relatively within normal limits. Labs revealed WBC 9.5, hemoglobin 11.8 with elevated RDW, 2, BUN 39, andcreatinine 1.32.Neurology recommended checking MRI of the brain. Vascular surgery Dr. Trula Slade hadformally consulted.  The patient underwent endarterectomy today with vascular surgery today.  Consultants  . Vascular surgery . Neurology  Procedures  . None  Antibiotics   Anti-infectives (From admission, onward)   Start      Dose/Rate Route Frequency Ordered Stop   08/12/18 0100  vancomycin (VANCOCIN) IVPB 1000 mg/200 mL premix     1,000 mg 200 mL/hr over 60 Minutes Intravenous Every 12 hours 08/11/18 1433 08/13/18 0059   08/11/18 1100  vancomycin (VANCOCIN) IVPB 1000 mg/200 mL premix  Status:  Discontinued     1,000 mg 200 mL/hr over 60 Minutes Intravenous On call 08/10/18 0837 08/11/18 1433     .   Subjective  The patient is resting comfortably after surgery. No new complaints.  Objective   Vitals:  Vitals:   08/11/18 1452 08/11/18 1601  BP: 130/75 131/73  Pulse: 88 86  Resp:  (!) 31  Temp: 98 F (36.7 C)   SpO2: 99% 98%    Exam:  Constitutional:  . The patient is awake, alert, and oriented x 3. No acute distress. Respiratory:  . No increased work of breathing. . No wheezes, rales, or rhonchi. . No tactile fremitus Cardiovascular:  . Regular rate and rhythm . No murmurs, ectopy, or gallups. . Normal lateral PMI. No thrills. Abdomen:  . Abdomen is soft, non-tender, non-distended . No hernias, masses, or organomegaly is appreciated. . Normoactive bowel sounds. Musculoskeletal:  . No cyanosis, clubbing, or edema Skin:  . No rashes, lesions, ulcers . palpation of skin: no induration or nodules Neurologic:  . CN 2-12 intact . Sensation all 4 extremities intact Psychiatric:  . Mental status o Mood, affect appropriate o Orientation to person, place, time  . judgment and insight appear intact     I have personally reviewed the following:   Today's Data  . CBC, Vitals  Scheduled Meds: . aspirin EC  81 mg Oral Daily  . digoxin  0.125 mg Oral Daily  . [START ON 08/12/2018] docusate sodium  100 mg Oral Daily  .  ezetimibe  10 mg Oral Daily  . fenofibrate  160 mg Oral Daily  . furosemide  40 mg Oral Daily  . insulin aspart  0-5 Units Subcutaneous QHS  . insulin aspart  0-9 Units Subcutaneous TID WC  . labetalol      . pantoprazole  40 mg Oral Daily  . pravastatin  80 mg Oral  Daily  . sacubitril-valsartan  1 tablet Oral BID  . sodium chloride flush  3 mL Intravenous Q12H  . spironolactone  25 mg Oral Daily   Continuous Infusions: . sodium chloride    . sodium chloride 50 mL/hr at 08/11/18 1559  . [START ON 08/12/2018] heparin    . magnesium sulfate bolus IVPB    . [START ON 08/12/2018] vancomycin      Principal Problem:   TIA (transient ischemic attack) Active Problems:   ATRIAL FIBRILLATION   Carotid artery stenosis, symptomatic, right   Dyslipidemia   Normocytic normochromic anemia   COPD without exacerbation (HCC)   AKI (acute kidney injury) ([place])   Type 2 diabetes mellitus with hypoglycemia without coma ([place])   LOS: 3 days    A & P  TIA secondary to symptomatic right carotid stenosis. Patient was found to have right upper extremity weakness which lasted for 30 minutes. During his last hospital stay 2 weeks ago he was told he needed carotid endarterectomy but he refused and wanted to have it done as an outpatient. He also had a cardiac cath during his last hospital stay showed severe three-vessel disease but he did not want any cardiac work-up CABG or PCI. Seen by vascular surgery plan for surgery on Monday. Patient had recent TIA work-up with LDL of 55 A1c of 6.9 right carotid stenosis 60 to 79%. The patient is at risk for recurrent stroke without CEA and needs continued hospital stay. Pt is s/p CEA. He has tolerated the procedure well.  Chronic atrial fibrillation on Coumadin rate controlled on digoxin and Coreg. Patient having on and off.'s of bradycardia. I will hold Coreg andcheck a digoxin level. On heparin bridge in preparation for surgery Coumadin on hold for surgery.  Hyperlipidemia: The patient's last lipids were drawn on  Jul 25, 2018. At that time total cholesterol  Was 95; HDL was 17; LDL was 55; triglycerides were 113. He is on pravastatin and Zetia and fenofibrate.  Type 2 diabetes: Hypoglycemia has resolved and D5 stopped. Oral  hypoglycemic agents remain on hold. The patient takes Tradjenta, metformin, repaglinide, and glipizide at home. His last hemoglobin A1c is 6.9 on May 31.   AKI: The patient's last creatinine noted is 0.99 he was admitted with a creatinine of 1.32 he was given gentle IV fluids and repeat creatinine 1.30. He is on Entresto, Lasix, and Aldactone. These medications may contribute to the patient's elevated creatinine.Monitor closely.  Hyponatremia:  Monitor.  Systolic heart failure with ejection fraction 30% on Jul 25, 2018. Continue lasix and aldactone and entresto.  Coronary artery disease with three-vessel CAD: The patient does not want PCI or CABG. He was noted to be high risk candidate for CABG.  Essential hypertension: improved after holding bp meds yesterday.monitor.  COPD: Noted and stable. The patient uses 4 L of oxygen at night at home. PRN albuterol.  Chronic iron deficiency anemia:  Hemoglobin stable at 12.2.  This patient needs continuous inpatient monitoring due to symptomatic carotid artery stenosis needing carotid endarterectomy .he is at high risk for recurrent stroke.  I have seen and examined this  patient myself. I have spent 30 minutes in his evaluation and care.  DVT prophylaxis:heparin Code Status:Full Family Communication:dw rose wife Disposition Plan:Pending carotid endarterectomy Consults called:Neurology, vascular surgery  Grayce Budden, DO [facility] Direct contact: see [email or web address]  7PM-7AM contact night coverage as above 08/11/2018, 5:11 PM  LOS: 2 days

## 2018-08-12 ENCOUNTER — Encounter (HOSPITAL_COMMUNITY): Payer: Self-pay | Admitting: Vascular Surgery

## 2018-08-12 LAB — PROTIME-INR
INR: 1.8 — ABNORMAL HIGH (ref 0.8–1.2)
Prothrombin Time: 20.5 seconds — ABNORMAL HIGH (ref 11.4–15.2)

## 2018-08-12 LAB — BASIC METABOLIC PANEL
Anion gap: 10 (ref 5–15)
BUN: 36 mg/dL — ABNORMAL HIGH (ref 8–23)
CO2: 21 mmol/L — ABNORMAL LOW (ref 22–32)
Calcium: 8.7 mg/dL — ABNORMAL LOW (ref 8.9–10.3)
Chloride: 101 mmol/L (ref 98–111)
Creatinine, Ser: 1.28 mg/dL — ABNORMAL HIGH (ref 0.61–1.24)
GFR calc Af Amer: >60 mL/min (ref >60)
GFR calc non Af Amer: 53 mL/min — ABNORMAL LOW (ref >60)
Glucose, Bld: 255 mg/dL — ABNORMAL HIGH (ref 70–99)
Potassium: 4.7 mmol/L (ref 3.5–5.1)
Sodium: 132 mmol/L — ABNORMAL LOW (ref 135–145)

## 2018-08-12 LAB — HEPARIN LEVEL (UNFRACTIONATED): Heparin Unfractionated: <0.1 IU/mL — ABNORMAL LOW (ref 0.30–0.70)

## 2018-08-12 LAB — CBC
HCT: 34.8 % — ABNORMAL LOW (ref 39.0–52.0)
Hemoglobin: 11.2 g/dL — ABNORMAL LOW (ref 13.0–17.0)
MCH: 26.9 pg (ref 26.0–34.0)
MCHC: 32.2 g/dL (ref 30.0–36.0)
MCV: 83.5 fL (ref 80.0–100.0)
Platelets: 552 10*3/uL — ABNORMAL HIGH (ref 150–400)
RBC: 4.17 MIL/uL — ABNORMAL LOW (ref 4.22–5.81)
RDW: 17.6 % — ABNORMAL HIGH (ref 11.5–15.5)
WBC: 10.4 10*3/uL (ref 4.0–10.5)
nRBC: 0 % (ref 0.0–0.2)

## 2018-08-12 LAB — GLUCOSE, CAPILLARY
Glucose-Capillary: 252 mg/dL — ABNORMAL HIGH (ref 70–99)
Glucose-Capillary: 222 mg/dL — ABNORMAL HIGH (ref 70–99)

## 2018-08-12 MED ORDER — OXYCODONE-ACETAMINOPHEN 5-325 MG PO TABS: 1.0000 | ORAL_TABLET | ORAL | 0 refills | Status: DC | PRN

## 2018-08-12 MED ORDER — POTASSIUM CHLORIDE CRYS ER 20 MEQ PO TBCR: 20.0000 meq | EXTENDED_RELEASE_TABLET | Freq: Every day | ORAL | 0 refills | Status: DC

## 2018-08-12 MED ORDER — PANTOPRAZOLE SODIUM 40 MG PO TBEC
40.0000 mg | DELAYED_RELEASE_TABLET | Freq: Every day | ORAL | 0 refills | Status: AC
Start: 2018-08-13 — End: ?

## 2018-08-12 MED ORDER — ASPIRIN 81 MG PO TBEC: 81.0000 mg | DELAYED_RELEASE_TABLET | Freq: Every day | ORAL | 0 refills | Status: DC

## 2018-08-12 MED ORDER — ACETAMINOPHEN 325 MG PO TABS: 650.0000 mg | ORAL_TABLET | Freq: Four times a day (QID) | ORAL | 0 refills | Status: DC | PRN

## 2018-08-12 MED ORDER — ENOXAPARIN SODIUM 60 MG/0.6ML ~~LOC~~ SOLN: 60.0000 mg | Freq: Two times a day (BID) | SUBCUTANEOUS | 0 refills | Status: DC

## 2018-08-12 NOTE — Plan of Care (Signed)
Pt is progressing toward desired goals for this admission.

## 2018-08-12 NOTE — Discharge Summary (Signed)
Physician Discharge Summary  Oscar [name] IOX:[id number] DOB: [birth year] DOA: 08/07/2018  PCP: Dione Housekeeper, MD  Admit date: 08/07/2018 Discharge date: 08/12/2018  Recommendations for Outpatient Follow-up:  1. Follow up with vascular surgery as directed. 2. Follow up with PCP in 7-10 days. 3. Follow up with neurology as directed.  Follow-up Information    [facility]. Schedule an appointment as soon as possible for a visit in 4 weeks.   Specialty: Neurology Contact information: [address] [place] [phone]       [name], MD In 3 weeks.   Specialties: Vascular Surgery, Cardiology Why: Office will call you to arrange your appt (sent) Contact information: [place] [address] [phone]            Discharge Diagnoses: Principal diagnosis is #1 1. TIA 2. Carotid artery stenosis 3. AKI 4. Hyponatremia 5. CHF with EF 30%. 6. COPD 7. Chronic respiratory failure on 4L O2 chronically  Discharge Condition: Good Disposition: Home  Diet recommendation: Heart healthy  Filed Weights   08/07/18 0836 08/11/18 0345 08/12/18 0415  Weight: 70 kg 70 kg 72.9 kg    History of present illness:  Oscar [name] is a 78 y.o. male with medical history significant of atrial fibrillation on Coumadin, systolic CHF 81-44%, Lt CEA in 2007, CAD, COPD on 4 L at night, DM type II, and HLD; who presented with complaints of left-sided weakness.  Patient's wife noted some difficulty with them walking yesterday at 1:30 pm.  Patient reports waking up this morning and feeling loopy with slurred speech and left-sided weakness. Upon EMS arrival patient reported patient's initial blood glucose 55.  Patient symptoms reportedly resolved after receiving D10. Patient does not routinely check his blood sugars at home, but does not recall having any significant lows prior. He is on glipizide, Tradjenta, and  metformin.  Patient had just been admitted into the hospital from 5/30-6/8, after presenting with suspected TIA found to have occluded right internal carotid artery stenosis, acute hypoxic respiratory failure, and new onset systolic congestive heart failure with EF 25 to 30%.  Patient was diuresed and underwent cardiac catheter which revealed severe three-vessel coronary artery disease.  Patient was evaluated and appears to have declined carotid endarterectomy by vascular surgery as well as CABG or high risk PCI at that time.  ED Course: Patient presented as a code stroke.  Dr. Leonel Ramsay neurology was consulted and initial CT scan of the brain showed no acute abnormalities.  Vital signs relatively within normal limits.  Labs revealed WBC 9.5, hemoglobin 11.8 with elevated RDW, 2, BUN 39, and creatinine 1.32.  Neurology recommended checking MRI of the brain.  Vascular surgery Dr. Trula Slade had formally consulted.  Hospital Course:  The patient was admitted to a telemetry bed. MRI demonstrated no acute infarct, but did demonstrate chronic microvascular ischemia. Vascular ultrasound was performed on 08/08/2018. It demonstrated a patent left carotid artery, but 60-79% stenosis of the right carotid artery. Vascular surgery was consulted. The patient agreed to carotid endarterectomy. He underwent the procedure on 08/11/2018. He has tolerated the procedure well and he has been cleared for discharge to home today.  Today's assessment: S: The patient is awake, alert, and oriented x 3.  O: Vitals:  Vitals:   08/12/18 0808 08/12/18 0844  BP:    Pulse: 81 (!) 102  Resp: (!) 26   Temp:    SpO2: 97%     Constitutional:  The patient is awake, alert, and oriented x 3. No acute distress. Respiratory:   CTA bilaterally, no w/r/r.   Respiratory effort normal. No retractions or accessory muscle use Cardiovascular:   RRR, no m/r/g  No LE extremity edema    Normal pedal pulses Abdomen:   Abdomen  appears normal; no tenderness or masses  No hernias  No HSM Musculoskeletal:   Digits/nails BUE: no clubbing, cyanosis, petechiae, infection  exam of joints, bones, muscles of at least one of following: head/neck, RUE, LUE, RLE, LLE   ? strength and tone normal, no atrophy, no abnormal movements ? No tenderness, masses ? Normal ROM, no contractures   gait and station Skin:   No rashes, lesions, ulcers  palpation of skin: no induration or nodules Neurologic:   CN 2-12 intact  Sensation all 4 extremities intact Psychiatric:   Mental status ? Mood, affect appropriate ? Orientation to person, place, time   judgment and insight appear intact    Discharge Instructions  Discharge Instructions    Activity as tolerated - No restrictions   Complete by: As directed    Call MD for:  persistant nausea and vomiting   Complete by: As directed    Call MD for:  severe uncontrolled pain   Complete by: As directed    Diet - low sodium heart healthy   Complete by: As directed    Discharge instructions   Complete by: As directed    Follow up with Dr. Donnetta Hutching as directed. Have PT/INR checked on 08/17/2018. Send results to Dr. Donnetta Hutching. Follow up with PCP in 7-10 days.   Increase activity slowly   Complete by: As directed      Allergies as of 08/12/2018      Reactions   Penicillins Hives, Other (See Comments)   Blisters on hands   Statins Other (See Comments)   Joint and muscle pain and low energy.      Medication List    TAKE these medications   acetaminophen 325 MG tablet Commonly known as: TYLENOL Take 2 tablets (650 mg total) by mouth every 6 (six) hours as needed for mild pain (or Fever >/= 101).   albuterol 108 (90 Base) MCG/ACT inhaler Commonly known as: ProAir HFA Inhale 2 puffs into the lungs every 6 (six) hours as needed for wheezing or shortness of breath.   aspirin 81 MG EC tablet Take 1 tablet (81 mg total) by mouth daily. Start taking on: August 13, 2018    carvedilol 3.125 MG tablet Commonly known as: COREG Take 1 tablet (3.125 mg total) by mouth 2 (two) times daily with a meal.   digoxin 0.125 MG tablet Commonly known as: LANOXIN Take 1 tablet (0.125 mg total) by mouth daily.   enoxaparin 60 MG/0.6ML injection Commonly known as: LOVENOX Inject 0.6 mLs (60 mg total) into the skin every 12 (twelve) hours.   ezetimibe 10 MG tablet Commonly known as: ZETIA Take 10 mg by mouth daily.   fenofibrate 160 MG tablet Take 160 mg by mouth daily.   Fish Oil 1200 MG Caps Take 1 capsule by mouth 3 (three) times daily.   furosemide 40 MG tablet Commonly known as: LASIX Take 1 tablet (40 mg total) by mouth daily.   glipiZIDE 10 MG 24 hr tablet Commonly known as: GLUCOTROL XL Take 10 mg by mouth 2 (two) times daily.   linagliptin 5 MG Tabs tablet Commonly known as: TRADJENTA Take 5 mg by mouth daily.   metFORMIN 500 MG  24 hr tablet Commonly known as: GLUCOPHAGE-XR Take 500 mg by mouth 2 (two) times daily.   multivitamin tablet Take 1 tablet by mouth daily.   oxyCODONE-acetaminophen 5-325 MG tablet Commonly known as: PERCOCET/ROXICET Take 1-2 tablets by mouth every 4 (four) hours as needed for moderate pain.   pantoprazole 40 MG tablet Commonly known as: PROTONIX Take 1 tablet (40 mg total) by mouth daily. Start taking on: August 13, 2018   potassium chloride SA 20 MEQ tablet Commonly known as: K-DUR Take 1 tablet (20 mEq total) by mouth daily.   pravastatin 40 MG tablet Commonly known as: PRAVACHOL Take 80 mg by mouth daily.   repaglinide 2 MG tablet Commonly known as: PRANDIN Take 2 mg by mouth 2 (two) times daily before a meal.   sacubitril-valsartan 24-26 MG Commonly known as: ENTRESTO Take 1 tablet by mouth 2 (two) times daily.   spironolactone 25 MG tablet Commonly known as: ALDACTONE Take 1 tablet (25 mg total) by mouth daily.   warfarin 7.5 MG tablet Commonly known as: COUMADIN Take 1 tablet (7.5 mg total)  by mouth daily.      Allergies  Allergen Reactions  . Penicillins Hives and Other (See Comments)    Blisters on hands  . Statins Other (See Comments)    Joint and muscle pain and low energy.    The results of significant diagnostics from this hospitalization (including imaging, microbiology, ancillary and laboratory) are listed below for reference.    Significant Diagnostic Studies: Ct Angio Head W Or Wo Contrast  Result Date: 07/24/2018 CLINICAL DATA:  Ataxia with stroke suspected EXAM: CT ANGIOGRAPHY HEAD AND NECK TECHNIQUE: Multidetector CT imaging of the head and neck was performed using the standard protocol during bolus administration of intravenous contrast. Multiplanar CT image reconstructions and MIPs were obtained to evaluate the vascular anatomy. Carotid stenosis measurements (when applicable) are obtained utilizing NASCET criteria, using the distal internal carotid diameter as the denominator. CONTRAST:  82mL OMNIPAQUE IOHEXOL 350 MG/ML SOLN COMPARISON:  Noncontrast head CT earlier today FINDINGS: CTA NECK FINDINGS Aortic arch: Atherosclerotic plaque.  Three vessel branching. Right carotid system: Bulky calcified plaque at the bifurcation with high-grade proximal ICA narrowing difficult to quantify due to degree of calcified plaque blooming. Downstream vessel is smooth and patent. Left carotid system: Mixed density plaque along the common carotid artery. Patulous bifurcation and proximal ICA after endarterectomy. There is narrowing at the presumed proximal anastomosis which measures 40% (narrowed segment compared to the more proximal vessel given the immediate subsequent bifurcation and postoperative patulous appearance). Vertebral arteries: Proximal subclavian atherosclerosis. Calcified plaque at the left vertebral origin with moderate narrowing. The downstream vessels are smooth and widely patent. Skeleton: Negative Other neck: No emergent finding Upper chest: There are layering  pleural effusions. Borderline interlobular septal thickening with airway thickening. There are enlarged mediastinal lymph nodes which are partially covered, measuring 17 mm at the left paratracheal station. Review of the MIP images confirms the above findings CTA HEAD FINDINGS Anterior circulation: Suboptimal bolus density. Calcified plaque along the bilateral carotid siphons without evidence of severe narrowing. Posterior circulation: The vertebral and basilar arteries are smooth and widely patent. Negative for branch occlusion or flow limiting stenosis. Negative for aneurysm. Venous sinuses: Patent Anatomic variants: None significant Delayed phase: Not obtained in the emergent setting Review of the MIP images confirms the above findings IMPRESSION: 1. No emergent finding. 2. Severe atheromatous narrowing at the right ICA origin to the degree that the lumen is not measurable.  3. Left carotid endarterectomy with ~40% stenosis at the proximal anastomosis. 4. Moderate atheromatous narrowing at the left vertebral origin. 5. Upper thoracic adenopathy that is partially covered. Recommend dedicated chest CT after convalescence. 6. Layering pleural effusions on both sides. Electronically Signed   By: Monte Fantasia M.D.   On: 07/24/2018 16:49   Dg Chest 2 View  Result Date: 07/26/2018 CLINICAL DATA:  Shortness of breath EXAM: CHEST - 2 VIEW COMPARISON:  07/24/2018 FINDINGS: Bilateral pleural effusions are noted similar to that seen on prior CT examination. Central vascular congestion is again noted. Mild interstitial edema is noted. No bony abnormality is seen. IMPRESSION: Changes of CHF stable from the previous exam. Electronically Signed   By: Inez Catalina M.D.   On: 07/26/2018 08:31   Ct Head Wo Contrast  Result Date: 07/24/2018 CLINICAL DATA:  Ataxia EXAM: CT HEAD WITHOUT CONTRAST TECHNIQUE: Contiguous axial images were obtained from the base of the skull through the vertex without intravenous contrast.  COMPARISON:  None. FINDINGS: Brain: There is atrophy and chronic small vessel disease changes. No acute intracranial abnormality. Specifically, no hemorrhage, hydrocephalus, mass lesion, acute infarction, or significant intracranial injury. Vascular: No hyperdense vessel or unexpected calcification. Skull: No acute calvarial abnormality. Sinuses/Orbits: Visualized paranasal sinuses and mastoids clear. Orbital soft tissues unremarkable. Other: None IMPRESSION: Atrophy, chronic microvascular disease. No acute intracranial abnormality. Electronically Signed   By: Rolm Baptise M.D.   On: 07/24/2018 15:17   Ct Angio Neck W And/or Wo Contrast  Result Date: 07/24/2018 CLINICAL DATA:  Ataxia with stroke suspected EXAM: CT ANGIOGRAPHY HEAD AND NECK TECHNIQUE: Multidetector CT imaging of the head and neck was performed using the standard protocol during bolus administration of intravenous contrast. Multiplanar CT image reconstructions and MIPs were obtained to evaluate the vascular anatomy. Carotid stenosis measurements (when applicable) are obtained utilizing NASCET criteria, using the distal internal carotid diameter as the denominator. CONTRAST:  69mL OMNIPAQUE IOHEXOL 350 MG/ML SOLN COMPARISON:  Noncontrast head CT earlier today FINDINGS: CTA NECK FINDINGS Aortic arch: Atherosclerotic plaque.  Three vessel branching. Right carotid system: Bulky calcified plaque at the bifurcation with high-grade proximal ICA narrowing difficult to quantify due to degree of calcified plaque blooming. Downstream vessel is smooth and patent. Left carotid system: Mixed density plaque along the common carotid artery. Patulous bifurcation and proximal ICA after endarterectomy. There is narrowing at the presumed proximal anastomosis which measures 40% (narrowed segment compared to the more proximal vessel given the immediate subsequent bifurcation and postoperative patulous appearance). Vertebral arteries: Proximal subclavian  atherosclerosis. Calcified plaque at the left vertebral origin with moderate narrowing. The downstream vessels are smooth and widely patent. Skeleton: Negative Other neck: No emergent finding Upper chest: There are layering pleural effusions. Borderline interlobular septal thickening with airway thickening. There are enlarged mediastinal lymph nodes which are partially covered, measuring 17 mm at the left paratracheal station. Review of the MIP images confirms the above findings CTA HEAD FINDINGS Anterior circulation: Suboptimal bolus density. Calcified plaque along the bilateral carotid siphons without evidence of severe narrowing. Posterior circulation: The vertebral and basilar arteries are smooth and widely patent. Negative for branch occlusion or flow limiting stenosis. Negative for aneurysm. Venous sinuses: Patent Anatomic variants: None significant Delayed phase: Not obtained in the emergent setting Review of the MIP images confirms the above findings IMPRESSION: 1. No emergent finding. 2. Severe atheromatous narrowing at the right ICA origin to the degree that the lumen is not measurable. 3. Left carotid endarterectomy with ~40% stenosis at  the proximal anastomosis. 4. Moderate atheromatous narrowing at the left vertebral origin. 5. Upper thoracic adenopathy that is partially covered. Recommend dedicated chest CT after convalescence. 6. Layering pleural effusions on both sides. Electronically Signed   By: Monte Fantasia M.D.   On: 07/24/2018 16:49   Ct Chest Wo Contrast  Result Date: 07/25/2018 CLINICAL DATA:  Enlarged lymph nodes identified on neck CT. EXAM: CT CHEST WITHOUT CONTRAST TECHNIQUE: Multidetector CT imaging of the chest was performed following the standard protocol without IV contrast. COMPARISON:  Radiograph 07/24/2018 neck CT 07/24/2018 FINDINGS: Cardiovascular: Coronary artery calcification and aortic atherosclerotic calcification. Mediastinum/Nodes: Small bilateral axillary lymph nodes  measure up to 12 mm short axis in the RIGHT axilla. No supraclavicular adenopathy. Multiple mildly enlarged paratracheal lymph nodes are present. Enlarged 16 mm RIGHT lower paratracheal lymph node. Similar 16 mm LEFT lower paratracheal lymph node. Subcarinal nodes measured 18 mm short axis. Lungs/Pleura: Moderate layering RIGHT effusion and small LEFT effusion. There is bibasilar passive atelectasis. Interlobular septal thickening within the lungs consistent with interstitial edema. Upper Abdomen: Limited view of the liver, kidneys, pancreas are unremarkable. Normal adrenal glands. Musculoskeletal: No aggressive osseous lesion. IMPRESSION: 1. Multiple enlarged medium-size mediastinal lymph nodes. Differential would include reactive adenopathy versus lymphoproliferative process (e.g. CLL). Favor lymphoproliferative process. Recommend clinical correlation and consider hematology oncology consult. At minimum recommend follow-up CT chest abdomen pelvis with contrast in 1 month. Include CT abdomen pelvis to evaluate for abdominal/pelvic lymphadenopathy. 2. Bilateral pleural effusions with interstitial edema consistent with congestive heart failure. 3. Coronary artery calcification and Aortic Atherosclerosis (ICD10-I70.0). Electronically Signed   By: Suzy Bouchard M.D.   On: 07/25/2018 15:45   Mr Brain Wo Contrast  Result Date: 08/07/2018 CLINICAL DATA:  Stroke follow-up. Acute onset of recurrent left-sided weakness lasting approximately 30 minutes this morning. Known right carotid artery stenosis. EXAM: MRI HEAD WITHOUT CONTRAST TECHNIQUE: Multiplanar, multiecho pulse sequences of the brain and surrounding structures were obtained without intravenous contrast. COMPARISON:  Insert CT head without contrast 08/07/2018. MRI brain 07/25/2018 FINDINGS: Brain: The diffusion-weighted images demonstrate no acute or subacute infarction. Moderate periventricular and subcortical T2 hyperintensities bilaterally are associated  with atrophy. No acute hemorrhage or mass lesion is present. Remote ischemic changes are present in the thalami bilaterally. There is some white matter changes extending into the brainstem. The cerebellum is normal. The ventricles are proportionate to the degree of atrophy. No significant extraaxial fluid collection is present. Vascular: Flow is present in the major intracranial arteries. Skull and upper cervical spine: The craniocervical junction is normal. Upper cervical spine is within normal limits. Chronic changes of clivus are stable. Sinuses/Orbits: A polyp or mucous retention cyst is noted at the floor of the right maxillary sinus. The paranasal sinuses and mastoid air cells are otherwise clear. The globes and orbits are within normal limits. IMPRESSION: 1. No acute infarct. 2. Stable moderate atrophy and white matter disease. This likely reflects the sequela of chronic microvascular ischemia. Electronically Signed   By: San Morelle M.D.   On: 08/07/2018 16:10   Mr Brain Wo Contrast  Result Date: 07/25/2018 CLINICAL DATA:  Acute neurological of an yesterday with altered mental status, slurred speech and balance disturbance. EXAM: MRI HEAD WITHOUT CONTRAST TECHNIQUE: Multiplanar, multiecho pulse sequences of the brain and surrounding structures were obtained without intravenous contrast. COMPARISON:  CT studies done yesterday. FINDINGS: Brain: Diffusion imaging does not show any acute or subacute infarction. No abnormality is seen affecting the pons or cerebellum. There are mild to  moderate chronic small-vessel ischemic changes of the cerebral hemispheric deep and subcortical white matter. No large vessel territory infarction. No mass lesion, hemorrhage, hydrocephalus or extra-axial collection. Vascular: Major vessels at the base of the brain show flow. Skull and upper cervical spine: Negative. Benign appearing heterogeneity of the clivus. Sinuses/Orbits: Clear/normal Other: None IMPRESSION: No  acute or subacute finding by MRI. Mild to moderate chronic small-vessel ischemic changes of the cerebral hemispheric white matter. Electronically Signed   By: Nelson Chimes M.D.   On: 07/25/2018 08:23   Dg Chest Port 1 View  Result Date: 07/27/2018 CLINICAL DATA:  Status post PICC line placement. EXAM: PORTABLE CHEST 1 VIEW COMPARISON:  PA and lateral chest 07/26/2018. FINDINGS: Tip of new PICC projects in the lower superior vena cava. Pulmonary edema seen on the comparison examination has resolved. There small bilateral pleural effusions and basilar atelectasis. Atherosclerosis and cardiomegaly noted. IMPRESSION: Tip of right PICC projects in the lower superior vena cava. Resolved pulmonary edema. Small bilateral pleural effusions basilar atelectasis. Electronically Signed   By: Inge Rise M.D.   On: 07/27/2018 13:19   Dg Chest Portable 1 View  Result Date: 07/24/2018 CLINICAL DATA:  Altered mental status today. EXAM: PORTABLE CHEST 1 VIEW COMPARISON:  Single-view of the chest 07/21/2018. PA and lateral chest 10/17/2005. FINDINGS: Cardiomegaly, pulmonary edema and small bilateral pleural effusions appear unchanged. Atherosclerosis is noted. IMPRESSION: No change in congestive heart failure with associated small bilateral pleural effusions. Electronically Signed   By: Inge Rise M.D.   On: 07/24/2018 16:27   Dg Chest Portable 1 View  Result Date: 07/21/2018 CLINICAL DATA:  Dyspnea EXAM: PORTABLE CHEST 1 VIEW COMPARISON:  10/17/2004 FINDINGS: The heart size is enlarged. There are prominent interstitial lung markings bilaterally. There are small bilateral pleural effusions. There is adjacent compressive presumed atelectasis. There is no pneumothorax. No acute osseous abnormality. IMPRESSION: Congestive heart failure with small bilateral pleural effusions. Electronically Signed   By: Constance Holster M.D.   On: 07/21/2018 18:23   Mr Cardiac Morphology W Wo Contrast  Result Date:  07/30/2018 CLINICAL DATA:  Ischemic cardiomyopathy EXAM: CARDIAC MRI TECHNIQUE: The patient was scanned on a 1.5 Tesla GE magnet. A dedicated cardiac coil was used. Functional imaging was done using Fiesta sequences. 2,3, and 4 chamber views were done to assess for RWMA's. Modified Simpson's rule using a short axis stack was used to calculate an ejection fraction on a dedicated work Conservation officer, nature. The patient received 8 cc of Multihance. After 10 minutes inversion recovery sequences were used to assess for infiltration and scar tissue. CONTRAST:  8 cc Gadavist FINDINGS: Small to moderate right pleural effusion, small left pleural effusion. Small pericardial effusion. The left ventricle is mildly dilated with normal wall thickness, EF 23%. Akinesis of the mid to apical anterior/anteroseptal/inferoseptal walls, the apical inferior and lateral walls, and the true apex. No LV apical thrombus noted. Mildly dilated right ventricle, EF 32%. Severe left atrial enlargement. Moderate right atrial enlargement. Mild-moderate mitral regurgitation. Trileaflet aortic valve with trivial regurgitation, no stenosis. Mild-moderate tricuspid regurgitation. Delayed enhancement images: 76-99% wall thickness late gadolinium enhancement (LGE) in the mid anteroseptal and mid inferoseptal wall segments. 76-99% wall thickness LGE at the apical cap and the apical inferior wall segment. Measurements: LVEDV 246 mL LVSV 57 mL LVEF 23% RVEDV 203 mL RVSV 65 mL RVEF 32% IMPRESSION: 1. Mildly dilated LV with EF 23%, wall motion abnormalities as noted above. 2. Mildly dilated RV with EF 32%, moderately depressed  systolic function. 3. Delayed enhancement presents a mixed picture. There are extensive wall motion abnormalities. The delayed enhancement images suggest that the mid anteroseptal/inferoseptal and the apical inferior and true apex wall segments would be unlikely to improve with revascularization. However, the other affected wall  segments appear viable. [name] Electronically Signed   By: Loralie Champagne M.D.   On: 07/30/2018 16:34   Dg Hips Bilat With Pelvis 3-4 Views  Result Date: 08/02/2018 CLINICAL DATA:  Fall, soreness, initial encounter. EXAM: DG HIP (WITH OR WITHOUT PELVIS) 3-4V BILAT COMPARISON:  None. FINDINGS: No acute osseous or joint abnormality. IMPRESSION: No acute osseous or joint abnormality. Electronically Signed   By: Lorin Picket M.D.   On: 08/02/2018 12:43   Ct Head Code Stroke Wo Contrast  Result Date: 08/07/2018 CLINICAL DATA:  Code stroke.  Left-sided weakness. EXAM: CT HEAD WITHOUT CONTRAST TECHNIQUE: Contiguous axial images were obtained from the base of the skull through the vertex without intravenous contrast. COMPARISON:  Head CT 07/24/2018 and MRI 07/25/2018 FINDINGS: Brain: There is no evidence of acute infarct, intracranial hemorrhage, mass, midline shift, or extra-axial fluid collection. Mild cerebral atrophy is again noted. Cerebral white matter hypodensities are unchanged and nonspecific but compatible with mild chronic small vessel ischemic disease. Vascular: Calcified atherosclerosis at the skull base. Likely iatrogenic venous gas in the left cavernous sinus and scattered small scalp veins bilaterally. Skull: No fracture or focal osseous lesion. Sinuses/Orbits: Visualized paranasal sinuses and mastoid air cells are clear. Orbits are unremarkable. Other: None. ASPECTS [facility] Stroke Program Early CT Score) - Ganglionic level infarction (caudate, lentiform nuclei, internal capsule, insula, M1-M3 cortex): 7 - Supraganglionic infarction (M4-M6 cortex): 3 Total score (0-10 with 10 being normal): 10 IMPRESSION: 1. No evidence of acute intracranial abnormality. 2. ASPECTS is 10. 3. Mild chronic small vessel ischemic disease. These results were communicated to Dr. Leonel Ramsay at 8:19 am on 08/07/2018 by text page via the [facility] messaging system. Electronically Signed   By: Logan Bores M.D.   On:  08/07/2018 08:19   Vas US Carotid  Result Date: 08/08/2018 Carotid Arterial Duplex Study Indications:       Confirm patency. Pre-surgical evaluation. Risk Factors:      Hyperlipidemia, Diabetes, coronary artery disease. Comparison Study:  Prior study from 07/25/18 is available for comparison. Performing Technologist: Sharion Dove RVS  Examination Guidelines: A complete evaluation includes B-mode imaging, spectral Doppler, color Doppler, and power Doppler as needed of all accessible portions of each vessel. Bilateral testing is considered an integral part of a complete examination. Limited examinations for reoccurring indications may be performed as noted.  Right Carotid Findings: +----------+--------+--------+--------+---------------------+------------------+           PSV cm/sEDV cm/sStenosisDescribe             Comments           +----------+--------+--------+--------+---------------------+------------------+ CCA Prox  69      14                                   intimal thickening +----------+--------+--------+--------+---------------------+------------------+ CCA Distal50      11                                   intimal thickening +----------+--------+--------+--------+---------------------+------------------+ ICA Prox  343     76  irregular and                                                             calcific                                +----------+--------+--------+--------+---------------------+------------------+ ICA Mid   139     41                                                      +----------+--------+--------+--------+---------------------+------------------+ ICA Distal90      27                                                      +----------+--------+--------+--------+---------------------+------------------+ ECA       109     2                                                        +----------+--------+--------+--------+---------------------+------------------+ +----------+--------+-------+--------+-------------------+           PSV cm/sEDV cmsDescribeArm Pressure (mmHG) +----------+--------+-------+--------+-------------------+ Subclavian118                                        +----------+--------+-------+--------+-------------------+ +---------+--------+--+--------+--+ VertebralPSV cm/s66EDV cm/s17 +---------+--------+--+--------+--+  Left Carotid Findings: +--------+--------+--------+--------+------------+--------+         PSV cm/sEDV cm/sStenosisDescribe    Comments +--------+--------+--------+--------+------------+--------+ ICA Prox62      14              heterogenous         +--------+--------+--------+--------+------------+--------+  Summary: Right Carotid: Velocities in the right ICA are consistent with a 60-79%                stenosis. No significant change in stenosis since prior study                done 07/25/18. Left Carotid: Patent CEA. Vertebrals:  Right vertebral artery demonstrates antegrade flow. Subclavians: Normal flow hemodynamics were seen in the right subclavian artery. *See table(s) above for measurements and observations.  Electronically signed by Harold Barban MD on 08/08/2018 at 4:34:38 PM.    Final    Vas US Carotid  Result Date: 07/25/2018 Carotid Arterial Duplex Study Indications:       TIA. Risk Factors:      Diabetes, coronary artery disease. Comparison Study:  Previous study: 02/09/2014 Performing Technologist: Abram Sander RVS  Examination Guidelines: A complete evaluation includes B-mode imaging, spectral Doppler, color Doppler, and power Doppler as needed of all accessible portions of each vessel. Bilateral testing is considered an integral part of a complete examination. Limited examinations for reoccurring indications may be performed as noted.  Right Carotid Findings:  +----------+--------+--------+--------+----------------------+--------+  PSV cm/sEDV cm/sStenosisDescribe              Comments +----------+--------+--------+--------+----------------------+--------+ CCA Prox  38      7               calcific and irregular         +----------+--------+--------+--------+----------------------+--------+ CCA Distal39      9               calcific and irregular         +----------+--------+--------+--------+----------------------+--------+ ICA Prox  316     62      60-79%  calcific and irregular         +----------+--------+--------+--------+----------------------+--------+ ICA Mid   72      11                                             +----------+--------+--------+--------+----------------------+--------+ ICA Distal62      22                                             +----------+--------+--------+--------+----------------------+--------+ ECA       97      12                                             +----------+--------+--------+--------+----------------------+--------+ +----------+--------+-------+--------+-------------------+           PSV cm/sEDV cmsDescribeArm Pressure (mmHG) +----------+--------+-------+--------+-------------------+ [id number]                                         +----------+--------+-------+--------+-------------------+ +---------+--------+--+--------+--+---------+ VertebralPSV cm/s49EDV cm/s11Antegrade +---------+--------+--+--------+--+---------+  Left Carotid Findings: +----------+--------+--------+--------+-------------------------+--------+           PSV cm/sEDV cm/sStenosisDescribe                 Comments +----------+--------+--------+--------+-------------------------+--------+ CCA Prox  47      7               heterogenous                      +----------+--------+--------+--------+-------------------------+--------+ CCA Distal111     9                heterogenous and calcific         +----------+--------+--------+--------+-------------------------+--------+ ICA Prox  56      17      1-39%   heterogenous                      +----------+--------+--------+--------+-------------------------+--------+ ICA Distal64      21                                                +----------+--------+--------+--------+-------------------------+--------+ ECA       137                                                       +----------+--------+--------+--------+-------------------------+--------+ +----------+--------+--------+--------+-------------------+  SubclavianPSV cm/sEDV cm/sDescribeArm Pressure (mmHG) +----------+--------+--------+--------+-------------------+           62                                          +----------+--------+--------+--------+-------------------+ +---------+--------+--+--------+-+---------+ VertebralPSV cm/s36EDV cm/s7Antegrade +---------+--------+--+--------+-+---------+  Summary: Right Carotid: Velocities in the right ICA are consistent with a 60-79%                stenosis. 60-79% stenosis based off plaque morphology. Left Carotid: Velocities in the left ICA are consistent with a 1-39% stenosis. Vertebrals: Bilateral vertebral arteries demonstrate antegrade flow. *See table(s) above for measurements and observations.  Electronically signed by Monica Martinez MD on 07/25/2018 at 3:07:12 PM.    Final    [place] Ekg Site Rite  Result Date: 07/27/2018 If Site Rite image not attached, placement could not be confirmed due to current cardiac rhythm.  [place] Ekg Site Rite  Result Date: 07/27/2018 If Site Rite image not attached, placement could not be confirmed due to current cardiac rhythm.   Microbiology: Recent Results (from the past 240 hour(s))  SARS Coronavirus 2     Status: None   Collection Time: 08/07/18  9:00 AM  Result Value Ref Range Status   SARS Coronavirus 2 NOT DETECTED NOT DETECTED Final     Comment: (NOTE) SARS-CoV-2 target nucleic acids are NOT DETECTED. The SARS-CoV-2 RNA is generally detectable in upper and lower respiratory specimens during the acute phase of infection.  Negative  results do not preclude SARS-CoV-2 infection, do not rule out co-infections with other pathogens, and should not be used as the sole basis for treatment or other patient management decisions.  Negative results must be combined with clinical observations, patient history, and epidemiological information. The expected result is Not Detected. Fact Sheet for Patients: http://www.biofiredefense.com/wp-content/uploads/2020/03/BIOFIRE-COVID -19-patients.pdf Fact Sheet for Healthcare Providers: http://www.biofiredefense.com/wp-content/uploads/2020/03/BIOFIRE-COVID -19-hcp.pdf This test is not yet approved or cleared by the [place] and  has been authorized for detection and/or diagnosis of SARS-CoV-2 by FDA under an Emergency Use Authorization (EUA).  This EUA will remain in effec t (meaning this test can be used) for the duration of  the COVID-19 declaration under Section 564(b)(1) of the Act, 21 U.S.C. section 360bbb-3(b)(1), unless the authorization is terminated or revoked sooner. Performed at [facility], [place] [address], [place], [place] [address]   MRSA PCR Screening     Status: None   Collection Time: 08/11/18  6:58 AM   Specimen: Nasal Mucosa; Nasopharyngeal  Result Value Ref Range Status   MRSA by PCR NEGATIVE NEGATIVE Final    Comment:        The GeneXpert MRSA Assay (FDA approved for NASAL specimens only), is one component of a comprehensive MRSA colonization surveillance program. It is not intended to diagnose MRSA infection nor to guide or monitor treatment for MRSA infections. Performed at [facility], [place] [address], [place], [place] [address]      Labs: Basic Metabolic Panel: Recent Labs  Lab 08/07/18 0806 08/07/18 0809 08/08/18 1033  08/09/18 0804 08/11/18 0520 08/12/18 0339  NA 132* 132*  --  133* 136 132*  K 4.5 4.6  --  4.5 4.1 4.7  CL 100 100  --  103 106 101  CO2 22  --   --  19* 18* 21*  GLUCOSE 135* 132*  --  171* 145* 255*  BUN 39* 37*  --  33* 36* 36*  CREATININE 1.32* 1.30*  --  1.17 1.40* 1.28*  CALCIUM 9.7  --   --  9.1 9.1 8.7*  MG  --   --  2.0  --   --   --    Liver Function Tests: Recent Labs  Lab 08/07/18 0806 08/09/18 0804 08/11/18 0520  AST 52* 35 31  ALT 38 34 32  ALKPHOS 49 44 44  BILITOT 0.9 1.6* 2.0*  PROT 5.9* 6.1* 5.8*  ALBUMIN 3.4* 3.2* 3.3*   No results for input(s): LIPASE, AMYLASE in the last 168 hours. No results for input(s): AMMONIA in the last 168 hours. CBC: Recent Labs  Lab 08/07/18 0806 08/07/18 0809 08/09/18 0804 08/10/18 0523 08/11/18 0520 08/12/18 0339  WBC 9.5  --  9.0 11.2* 9.8 10.4  NEUTROABS 7.4  --  6.1  --  6.4  --   HGB 11.8* 13.3 11.9* 12.2* 11.7* 11.2*  HCT 37.3* 39.0 36.8* 38.1* 36.6* 34.8*  MCV 85.9  --  83.6 84.1 83.6 83.5  PLT 350  --  444* 511* 549* 552*   Cardiac Enzymes: No results for input(s): CKTOTAL, CKMB, CKMBINDEX, TROPONINI in the last 168 hours. BNP: BNP (last 3 results) Recent Labs    07/21/18 1754  BNP 2,433.0*    ProBNP (last 3 results) No results for input(s): PROBNP in the last 8760 hours.  CBG: Recent Labs  Lab 08/11/18 0909 08/11/18 1308 08/11/18 2142 08/12/18 0638 08/12/18 1113  GLUCAP 136* 152* 293* 252* 222*    Principal Problem:   TIA (transient ischemic attack) Active Problems:   ATRIAL FIBRILLATION   Carotid artery stenosis, symptomatic, right   Dyslipidemia   Normocytic normochromic anemia   COPD without exacerbation (HCC)   AKI (acute kidney injury) ([place])   Type 2 diabetes mellitus with hypoglycemia without coma ([place])   Time coordinating discharge: 38 minutes  Signed:        Mata Rowen, DO [facility]  08/12/2018, 2:40 PM

## 2018-08-12 NOTE — Care Management Important Message (Signed)
Important Message  Patient Details  Name: Oscar [name] MRN: [id number] Date of Birth: 1940/11/26   Medicare Important Message Given:  Yes    Shelda Altes 08/12/2018, 1:04 PM

## 2018-08-12 NOTE — Progress Notes (Signed)
Inpatient Diabetes Program Recommendations  AACE/ADA: New Consensus Statement on Inpatient Glycemic Control (2015)  Target Ranges:  Prepandial:   less than 140 mg/dL      Peak postprandial:   less than 180 mg/dL (1-2 hours)      Critically ill patients:  140 - 180 mg/dL   Results for ISAIAS, DOWSON (MRN [id number]) as of 08/12/2018 09:56  Ref. Range 08/11/2018 06:06 08/11/2018 09:09 08/11/2018 13:08 08/11/2018 21:42 08/12/2018 06:38  Glucose-Capillary Latest Ref Range: 70 - 99 mg/dL 149 (H) 136 (H) 152 (H) 293 (H) 252 (H)   Review of Glycemic Control  Diabetes history: DM 2 Outpatient Diabetes medications: Glipizide 10 mg bid, Metformin 500 mg bid, Tradjenta 5 mg daily  Current orders for Inpatient glycemic control:  Novolog 0-9 units tid Novolog 0-5 units qhs  Inpatient Diabetes Program Recommendations:    Patient received Decadron 10 mg yesterday. Glucose trends in the 200's. Consider increasing Novolog correction scale to 0-15 units tid. Glucose trends should decrease in 24-48 hours.  Thanks,  Tama Headings RN, MSN, BC-ADM Inpatient Diabetes Coordinator Team Pager [phone] (8a-5p)

## 2018-08-12 NOTE — Progress Notes (Signed)
Pt d/c home, no new concerns, pt is alert and oriented. D/c instructions done with teach back. Pt verbalize understanding. Pt will be transported home by spouse./

## 2018-08-12 NOTE — Progress Notes (Signed)
Pt A line removed by Crissie Sickles as ordered; pt heparin gtt started as ordered. Will continue to closely monitor. Delia Heady RN

## 2018-08-12 NOTE — Progress Notes (Signed)
Patient ID: Oscar [name], male   DOB: 1940-03-26, 78 y.o.   MRN: [id number]  Progress Note    08/12/2018 10:00 AM 1 Day Post-Op  Subjective: Looks good this morning.  Neurologically intact.  Some bruising in his right neck with no hematoma.   Vitals:   08/12/18 0808 08/12/18 0844  BP:    Pulse: 81 (!) 102  Resp: (!) 26   Temp:    SpO2: 97%    Physical Exam: Neurologically intact.  Mild bruising in his right neck with no hematoma.  CBC    Component Value Date/Time   WBC 10.4 08/12/2018 0339   RBC 4.17 (L) 08/12/2018 0339   HGB 11.2 (L) 08/12/2018 0339   HCT 34.8 (L) 08/12/2018 0339   PLT 552 (H) 08/12/2018 0339   MCV 83.5 08/12/2018 0339   MCH 26.9 08/12/2018 0339   MCHC 32.2 08/12/2018 0339   RDW 17.6 (H) 08/12/2018 0339   LYMPHSABS 2.2 08/11/2018 0520   MONOABS 0.9 08/11/2018 0520   EOSABS 0.1 08/11/2018 0520   BASOSABS 0.1 08/11/2018 0520    BMET    Component Value Date/Time   NA 132 (L) 08/12/2018 0339   K 4.7 08/12/2018 0339   CL 101 08/12/2018 0339   CO2 21 (L) 08/12/2018 0339   GLUCOSE 255 (H) 08/12/2018 0339   BUN 36 (H) 08/12/2018 0339   CREATININE 1.28 (H) 08/12/2018 0339   CALCIUM 8.7 (L) 08/12/2018 0339   GFRNONAA 53 (L) 08/12/2018 0339   GFRAA >60 08/12/2018 0339    INR    Component Value Date/Time   INR 1.8 (H) 08/12/2018 0339   INR 1.7 08/29/2008 1435     Intake/Output Summary (Last 24 hours) at 08/12/2018 1000 Last data filed at 08/12/2018 0617 Gross per 24 hour  Intake 1575.1 ml  Output 850 ml  Net 725.1 ml     Assessment/Plan:  78 y.o. male stable postop day 1.  Has voided.  Stable for discharge to home from vascular standpoint.  Has resumed IV heparin.  Okay to resume bridge of Lovenox to Coumadin from vascular standpoint.  We will see him in the office in several weeks for follow-up     Oscar Posner, MD [facility] [phone] 08/12/2018 10:00 AM

## 2018-08-13 NOTE — Anesthesia Postprocedure Evaluation (Signed)
Anesthesia Post Note  Patient: Oscar [name]  Procedure(s) Performed: ENDARTERECTOMY CAROTID RIGHT (Right ) PATCH ANGIOPLAST USING HEMASHIELD PLATINUM FINESSE (Right Neck)     Patient location during evaluation: PACU Anesthesia Type: General Level of consciousness: awake and alert Pain management: pain level controlled Vital Signs Assessment: post-procedure vital signs reviewed and stable Respiratory status: spontaneous breathing, nonlabored ventilation, respiratory function stable and patient connected to nasal cannula oxygen Cardiovascular status: blood pressure returned to baseline and stable Postop Assessment: no apparent nausea or vomiting Anesthetic complications: no    Last Vitals:  Vitals:   08/12/18 0808 08/12/18 0844  BP:    Pulse: 81 (!) 102  Resp: (!) 26   Temp:    SpO2: 97%     Last Pain:  Vitals:   08/12/18 0808  TempSrc:   PainSc: 0-No pain                 Craven Crean S

## 2018-08-20 ENCOUNTER — Ambulatory Visit (INDEPENDENT_AMBULATORY_CARE_PROVIDER_SITE_OTHER): Payer: Medicare Other | Admitting: Student

## 2018-08-20 ENCOUNTER — Encounter (INDEPENDENT_AMBULATORY_CARE_PROVIDER_SITE_OTHER): Payer: Self-pay

## 2018-08-20 ENCOUNTER — Other Ambulatory Visit: Payer: Self-pay

## 2018-08-20 ENCOUNTER — Encounter: Payer: Self-pay | Admitting: Student

## 2018-08-20 VITALS — BP 117/64 | HR 74 | Temp 98.4°F | Ht 66.0 in | Wt 154.0 lb

## 2018-08-20 DIAGNOSIS — I5042 Chronic combined systolic (congestive) and diastolic (congestive) heart failure: Secondary | ICD-10-CM | POA: Diagnosis not present

## 2018-08-20 DIAGNOSIS — I6521 Occlusion and stenosis of right carotid artery: Secondary | ICD-10-CM

## 2018-08-20 DIAGNOSIS — I4821 Permanent atrial fibrillation: Secondary | ICD-10-CM | POA: Diagnosis not present

## 2018-08-20 DIAGNOSIS — I10 Essential (primary) hypertension: Secondary | ICD-10-CM

## 2018-08-20 DIAGNOSIS — I2510 Atherosclerotic heart disease of native coronary artery without angina pectoris: Secondary | ICD-10-CM

## 2018-08-20 DIAGNOSIS — E785 Hyperlipidemia, unspecified: Secondary | ICD-10-CM

## 2018-08-20 NOTE — Progress Notes (Signed)
Cardiology Office Note    Date:  08/20/2018   ID:  Oscar [name], DOB 05/14/1940, MRN [id number]  PCP:  Dione Housekeeper, MD  Cardiologist: Minus Breeding, MD    Chief Complaint  Patient presents with   Hospitalization Follow-up    History of Present Illness:    Oscar [name] is a 78 y.o. male with past medical history of permanent atrial fibrillation, chronic diastolic CHF, carotid artery stenosis (s/p L CEA in 2007), HTN, and HLD who presents to the office today for hospital follow-up.  He was supposed to have a telehealth visit with Dr. Percival Spanish on 07/21/2018 but refused to speak on the phone and per his wife's report was having worsening shortness of breath.  Follow-up visits were offered in [place] but he refused and therefore it was recommended that he proceed to [facility]. BNP was elevated to 2433 and he declined admission, therefore he was given IV Lasix and informed to increase his home Lasix from 40 mg daily to 40 mg twice daily.  He presented back to [facility] ED on 07/24/2018 for evaluation of altered mental status. Was found to have severe narrowing at the right ICA origin and Head CT showed atrophy with chronic microvascular disease but no acute intracranial abnormalities. Presenting episode was thought to be most consistent with a TIA secondary to the right ICA stenosis. A repeat echocardiogram was performed as part of his work-up and showed a newly reduced EF of 25 to 30%. Coumadin was therefore held with anticipation of a cardiac catheterization once INR was less than 2. He developed worsening dyspnea during admission and was found to have a Co-ox at 47. Cath was performed by Dr. Haroldine Laws on 07/29/2018 and showed 100% Proximal-RCA stenosis, 99% Proximal-LAD, 75% Ost LM, 70% LCx, 40% PDA, and 30% Ost LCx.  Cardiac MRI was performed to assess viability and showed a mixed picture with the mid anteroseptal/inferoseptal and the apical inferior and true apex wall segments unlikely to  improve with revascularization however the other affected wall segments appeared viable. CT surgery was consulted but the patient declined CABG. Options regarding high risk PCI with atherectomy and Impella support were reviewed but the patient declined this as well and wished to pursue medical management. Also refused CEA during admission and wished to return at a later date for the procedure. Was discharged on 08/02/2018 with his medications including ASA 81mg  daily, Coreg 3.125mg  BID, Digoxin 0.125mg  daily, Zetia 10mg  daily, Lasix 40mg  daily, Pravastatin 40mg  daily, Entresto 24-26mg  BID, Spironolactone 25mg  daily, and Coumadin.    Presented back to [facility] ED on 6/13 for evaluation of AMS, slurred speech, and decreased balance. Repeat Head CT and Brain MRI showed no acute intracranial abnormalities. Eventually underwent right CEA on 08/11/2018 by Dr. Donnetta Hutching.  Was discharged home the morning after his procedure with no medication changes made to his cardiac regimen at that time.  In talking with the patient today, he reports overall doing well since hospital discharge. Was initially very weak but says this has started to improve. Says his breathing is the best it has been in months. He has been following daily weights and this has been at 145 - 146 lbs on his home scales (at 154 lbs on the scales today but he had two key rings attached to his pants and multiple tools). He denies any orthopnea, PND, or edema. Denies any chest pain or palpitations.   Reports good compliance with his current medication regimen. No noted side-effects. He  has been walking in his yard for exercise.    Past Medical History:  Diagnosis Date   Anemia    Atrial fibrillation ([place])    permanent   Cardiomyopathy    EF 35% in the past--Improved to 55% echo 2/10. Moderately severe mitral regurgitation in the past, now improved,   Carotid artery stenosis    CHF (congestive heart failure) ([place]) 2002   COPD (chronic  obstructive pulmonary disease) ([place])    Coronary artery disease    non obstructive   Diabetes mellitus    Dyslipidemia    History of tobacco abuse    Nephrolithiasis    Tremors of nervous system     Past Surgical History:  Procedure Laterality Date   CAROTID ENDARTERECTOMY Left 10-21-05   cea   ENDARTERECTOMY Right 08/11/2018   Procedure: ENDARTERECTOMY CAROTID RIGHT;  Surgeon: [name], MD;  Location: [facility] OR;  Service: Vascular;  Laterality: Right;   left carotid endarterectomy     PATCH ANGIOPLASTY Right 08/11/2018   Procedure: PATCH ANGIOPLAST USING HEMASHIELD PLATINUM FINESSE;  Surgeon: [name], MD;  Location: [facility] OR;  Service: Vascular;  Laterality: Right;   RIGHT/LEFT HEART CATH AND CORONARY ANGIOGRAPHY N/A 07/29/2018   Procedure: RIGHT/LEFT HEART CATH AND CORONARY ANGIOGRAPHY;  Surgeon: Jolaine Artist, MD;  Location: [facility];  Service: Cardiovascular;  Laterality: N/A;   TONSILLECTOMY      Current Medications: Outpatient Medications Prior to Visit  Medication Sig Dispense Refill   acetaminophen (TYLENOL) 325 MG tablet Take 2 tablets (650 mg total) by mouth every 6 (six) hours as needed for mild pain (or Fever >/= 101). 30 tablet 0   albuterol (PROAIR HFA) 108 (90 Base) MCG/ACT inhaler Inhale 2 puffs into the lungs every 6 (six) hours as needed for wheezing or shortness of breath.     aspirin EC 81 MG EC tablet Take 1 tablet (81 mg total) by mouth daily. 30 tablet 0   carvedilol (COREG) 3.125 MG tablet Take 1 tablet (3.125 mg total) by mouth 2 (two) times daily with a meal. 60 tablet 0   digoxin (LANOXIN) 0.125 MG tablet Take 1 tablet (0.125 mg total) by mouth daily. 90 tablet 3   ezetimibe (ZETIA) 10 MG tablet Take 10 mg by mouth daily.       fenofibrate 160 MG tablet Take 160 mg by mouth daily.     furosemide (LASIX) 40 MG tablet Take 1 tablet (40 mg total) by mouth daily. 30 tablet 0   linagliptin (TRADJENTA) 5 MG TABS tablet Take 5  mg by mouth daily.      metFORMIN (GLUCOPHAGE-XR) 500 MG 24 hr tablet Take 500 mg by mouth 2 (two) times daily.      Multiple Vitamin (MULTIVITAMIN) tablet Take 1 tablet by mouth daily.     Omega-3 Fatty Acids (FISH OIL) 1200 MG CAPS Take 1 capsule by mouth 3 (three) times daily.      pantoprazole (PROTONIX) 40 MG tablet Take 1 tablet (40 mg total) by mouth daily. 30 tablet 0   pravastatin (PRAVACHOL) 40 MG tablet Take 80 mg by mouth daily.      repaglinide (PRANDIN) 2 MG tablet Take 2 mg by mouth 2 (two) times daily before a meal.      sacubitril-valsartan (ENTRESTO) 24-26 MG Take 1 tablet by mouth 2 (two) times daily. 60 tablet 0   spironolactone (ALDACTONE) 25 MG tablet Take 1 tablet (25 mg total) by mouth daily. 30 tablet 0  warfarin (COUMADIN) 7.5 MG tablet Take 1 tablet (7.5 mg total) by mouth daily.     enoxaparin (LOVENOX) 60 MG/0.6ML injection Inject 0.6 mLs (60 mg total) into the skin every 12 (twelve) hours. 6 mL 0   oxyCODONE-acetaminophen (PERCOCET/ROXICET) 5-325 MG tablet Take 1-2 tablets by mouth every 4 (four) hours as needed for moderate pain. 20 tablet 0   potassium chloride SA (K-DUR) 20 MEQ tablet Take 1 tablet (20 mEq total) by mouth daily. 30 tablet 0   glipiZIDE (GLUCOTROL XL) 10 MG 24 hr tablet Take 10 mg by mouth 2 (two) times daily.      No facility-administered medications prior to visit.      Allergies:   Penicillins and Statins   Social History   Socioeconomic History   Marital status: Married    Spouse name: Not on file   Number of children: 5   Years of education: 12   Highest education level: Not on file  Occupational History   Occupation: Retired  Scientist, product/process development strain: Not on file   Food insecurity    Worry: Not on file    Inability: Not on Lexicographer needs    Medical: Not on file    Non-medical: Not on file  Tobacco Use   Smoking status: Former Smoker    Types: Cigarettes    Quit date:  02/25/2000    Years since quitting: 18.4   Smokeless tobacco: Former Network engineer and Sexual Activity   Alcohol use: No   Drug use: No   Sexual activity: Not on file  Lifestyle   Physical activity    Days per week: Not on file    Minutes per session: Not on file   Stress: Not on file  Relationships   Social connections    Talks on phone: Not on file    Gets together: Not on file    Attends religious service: Not on file    Active member of club or organization: Not on file    Attends meetings of clubs or organizations: Not on file    Relationship status: Not on file  Other Topics Concern   Not on file  Social History Narrative   Lives at home with his wife.   Right-handed.   3-4 cups caffeine per day.     Family History:  The patient's family history includes Coronary artery disease in his mother and another family member; Deep vein thrombosis in his son; Diabetes in his daughter and mother; Heart attack in his brother, mother, sister, and son; Heart disease in his brother, mother, and sister; Heart disease (age of onset: 53) in his son; Hyperlipidemia in his brother; Hypertension in his brother, daughter, mother, and sister; Other in his brother, daughter, and mother.   Review of Systems:   Please see the history of present illness.     General:  No chills, fever, night sweats or weight changes. Positive for fatigue (improving).  Cardiovascular:  No chest pain, dyspnea on exertion, edema, orthopnea, palpitations, paroxysmal nocturnal dyspnea. Dermatological: No rash, lesions/masses Respiratory: No cough, dyspnea Urologic: No hematuria, dysuria Abdominal:   No nausea, vomiting, diarrhea, bright red blood per rectum, melena, or hematemesis Neurologic:  No visual changes, wkns, changes in mental status. All other systems reviewed and are otherwise negative except as noted above.   Physical Exam:    VS:  BP 117/64    Pulse 74    Temp 98.4 F (36.9 C)  Ht 5\' 6"   (1.676 m)    Wt 154 lb (69.9 kg)    BMI 24.86 kg/m    General: Well developed elderly male appearing in no acute distress. Head: Normocephalic, atraumatic, sclera non-icteric, no xanthomas, nares are without discharge.  Neck: JVD not elevated.  R CEA appears well healing with no erythema or drainage.  Lungs: Respirations regular and unlabored, without wheezes or rales.  Heart: Irregularly irregular. No S3 or S4.  No murmur, no rubs, or gallops appreciated. Abdomen: Soft, non-tender, non-distended with normoactive bowel sounds. No hepatomegaly. No rebound/guarding. No obvious abdominal masses. Msk:  Strength and tone appear normal for age. No joint deformities or effusions. Extremities: No clubbing or cyanosis. No lower extremity edema.  Distal pedal pulses are 2+ bilaterally. Neuro: Alert and oriented X 3. Moves all extremities spontaneously. No focal deficits noted. Psych:  Responds to questions appropriately with a normal affect. Skin: No rashes or lesions noted  Wt Readings from Last 3 Encounters:  08/20/18 154 lb (69.9 kg)  08/12/18 160 lb 11.2 oz (72.9 kg)  07/28/18 153 lb 3.2 oz (69.5 kg)     Studies/Labs Reviewed:   EKG:  EKG is not ordered today.    Recent Labs: 07/21/2018: B Natriuretic Peptide 2,433.0 08/08/2018: Magnesium 2.0 08/11/2018: ALT 32 08/12/2018: BUN 36; Creatinine, Ser 1.28; Hemoglobin 11.2; Platelets 552; Potassium 4.7; Sodium 132   Lipid Panel    Component Value Date/Time   CHOL 95 07/25/2018 0443   TRIG 113 07/25/2018 0443   HDL 17 (L) 07/25/2018 0443   CHOLHDL 5.6 07/25/2018 0443   VLDL 23 07/25/2018 0443   LDLCALC 55 07/25/2018 0443    Additional studies/ records that were reviewed today include:   Echocardiogram: 07/25/2018 IMPRESSIONS    1. The left ventricle has severely reduced systolic function, with an ejection fraction of 25-30%. The cavity size was mildly dilated. Left ventricular diastolic Doppler parameters are indeterminate.  2. LVEF  is severly depressed Only base of ventricle moves well.  3. The right ventricle has moderately reduced systolic function. The cavity was mildly enlarged. There is not assessed.  4. Left atrial size was severely dilated.  5. Right atrial size was severely dilated.  6. Mildly thickened tricuspid valve leaflets.  7. The mitral valve is abnormal. Mild thickening of the mitral valve leaflet. There is mild mitral annular calcification present.  8. The tricuspid valve is abnormal.  9. The aortic valve is tricuspid. Mild thickening of the aortic valve. Mild calcification of the aortic valve. Aortic valve regurgitation is mild by color flow Doppler. 10. The inferior vena cava was dilated in size with <50% respiratory variability. 11. The interatrial septum was not assessed.  Cardiac Catheterization: 07/29/2018  Prox RCA lesion is 100% stenosed.  Prox LAD lesion is 99% stenosed.  Ost LM lesion is 75% stenosed.  Prox Cx to Mid Cx lesion is 70% stenosed.  LPDA lesion is 40% stenosed.  Ost Cx to Prox Cx lesion is 30% stenosed.   Findings:  Ao = 148/81 (109)  LV = 13215 RA = 7 RV = 59/6 PA = 59/24 (37) PCW = 22 Fick cardiac output/index = 4.1/2.3 PVR = 3.3 WU Ao sat = 93% PA sat = 62%, 64%  Assessment: 1. Severe 3v CAD in left dominant system with 70-80% LM lesion and 99% proximal LAD lesion 2. Ischemic CM EF 25% 3. Mild to moderate pulmonary HTN 4. Moderately reduced CO  Plan/Discussion:  Will d/w with TCTS regarding possible CABG and  [place]. If not surgical candidate will need PCI of LAD with atherectomy +/- LM PCI.   I discussed with his wife by phone as well.  Assessment:    1. Coronary artery disease involving native coronary artery of native heart without angina pectoris   2. Chronic combined systolic and diastolic heart failure (HCC)   3. Permanent atrial fibrillation   4. Carotid artery stenosis, symptomatic, right   5. Essential hypertension   6. Hyperlipidemia  LDL goal <70      Plan:   In order of problems listed above:  1. CAD - recent catheterization showed 100% Proximal-RCA stenosis, 99% Proximal-LAD, 75% Ost LM, 70% LCx, 40% PDA, and 30% Ost LCx. Cardiac MRI was performed to assess viability and showed a mixed picture with the mid anteroseptal/inferoseptal and the apical inferior and true apex wall segments unlikely to improve with revascularization however the other affected wall segments appeared viable.  - he declined CABG at that time and also declined high-risk PCI with atherectomy and Impella support. Reviewed options with the patient again today and he continues to decline CABG but says he would be open to further discussing PCI. He has cataract surgery scheduled in 08/2018 and prefers to have time to heal from that before coming into the hospital for a cardiac procedure. I was direct with the patient and told him that given the extent of his CAD, this needs to be performed sooner rather than later if he does wish to undergo the procedure and if a candidate for he is at high-risk for decompensation and death. He understands the risks and wishes to follow-up in 09/2018 and discuss further at that time. I encouraged him to make [place] aware of any changes in his symptoms. Denies any recent chest pain or dyspnea on exertion at this time.  - continue ASA, BB, and statin therapy.   2. Chronic Combined Systolic and Diastolic CHF - EF reduced to 25-30% during recent admission. He appears euvolemic by examination today and weight has continued to decline on his home scales.  - continue Coreg 3.125mg  BID, Entresto 24-26mg  BID, Spironolactone 25mg  daily, and Lasix 40mg  daily. Will not further titrate at this time given low-normal BP with SBP in the low-100's when checked at home. Creatinine was stable at 1.28 when checked on 08/12/2018. He also has repeat labs with his PCP next week.   3. Permanent Atrial Fibrillation - he denies any recent palpitations and  HR was well-controlled during admission. In the 70's during today's visit. Continue Coreg at current dosing for rate-control. - he denies any evidence of active bleeding. Remains on Coumadin. INR followed by PCP.   4. Carotid Artery Stenosis - s/p L CEA in 2007 and recent right CEA on 08/11/2018 by Dr. Donnetta Hutching. He denies any post-op complications. Surgical site appears well-healing. He has follow-up with Vascular Surgery scheduled for next week.   5. HTN - BP well-controlled at 117/64 during today's visit with lower readings obtained at home. I encouraged him to keep a BP log. Continue current regimen at this time.   6. HLD - FLP during recent admission showed total cholesterol of 95, HDL 17, and LDL 55.He remains on Zetia, Fenofibrate, and Pravastatin.     Medication Adjustments/Labs and Tests Ordered: Current medicines are reviewed at length with the patient today.  Concerns regarding medicines are outlined above.  Medication changes, Labs and Tests ordered today are listed in the Patient Instructions below. Patient Instructions  Medication Instructions: Your physician recommends that  you continue on your current medications as directed. Please refer to the Current Medication list given to you today.  Labwork: None today  Procedures/Testing: None today  Follow-Up:  In August with Dr.Hochrein at the [facility] location  Any Additional Special Instructions Will Be Listed Below (If Applicable).  If you need a refill on your cardiac medications before your next appointment, please call your pharmacy.   Thank you for choosing [place] !        Signed, Erma Heritage, PA-C  08/20/2018 5:15 PM    [place] [address] [place], [place] [address] Phone: [phone] Fax: [phone]

## 2018-08-20 NOTE — Patient Instructions (Signed)
Medication Instructions: Your physician recommends that you continue on your current medications as directed. Please refer to the Current Medication list given to you today.   Labwork: None today  Procedures/Testing: None today  Follow-Up:   In August with Dr.Hochrein at the [facility] location   Any Additional Special Instructions Will Be Listed Below (If Applicable).     If you need a refill on your cardiac medications before your next appointment, please call your pharmacy.      Thank you for choosing [facility] !

## 2018-08-24 ENCOUNTER — Ambulatory Visit (INDEPENDENT_AMBULATORY_CARE_PROVIDER_SITE_OTHER): Payer: Self-pay | Admitting: Vascular Surgery

## 2018-08-24 ENCOUNTER — Other Ambulatory Visit: Payer: Self-pay

## 2018-08-24 ENCOUNTER — Encounter: Payer: Self-pay | Admitting: Vascular Surgery

## 2018-08-24 VITALS — BP 90/55 | HR 67 | Temp 97.4°F | Resp 18 | Ht 66.0 in | Wt 151.0 lb

## 2018-08-24 DIAGNOSIS — I6521 Occlusion and stenosis of right carotid artery: Secondary | ICD-10-CM

## 2018-08-24 NOTE — Progress Notes (Signed)
Patient name: Oscar [name] MRN: [id number] DOB: 1940-10-06 Sex: male  REASON FOR VISIT: Low up recent right carotid endarterectomy  HPI: Oscar [name] is a 78 y.o. male here today for follow-up.  He had had 2 separate episodes of neurologic deficit that was felt to be likely related to his right carotid critical stenosis.  He had cardiac evaluation revealing critical coronary disease but refused consideration for bypass.  Due to his recurrent neurologic deficits we proceeded with endarterectomy.  He did extremely well and had no cardiac difficulties.  Had no neurologic difficult.  Current Outpatient Medications  Medication Sig Dispense Refill  . acetaminophen (TYLENOL) 325 MG tablet Take 2 tablets (650 mg total) by mouth every 6 (six) hours as needed for mild pain (or Fever >/= 101). 30 tablet 0  . albuterol (PROAIR HFA) 108 (90 Base) MCG/ACT inhaler Inhale 2 puffs into the lungs every 6 (six) hours as needed for wheezing or shortness of breath.    Marland Kitchen aspirin EC 81 MG EC tablet Take 1 tablet (81 mg total) by mouth daily. 30 tablet 0  . carvedilol (COREG) 3.125 MG tablet Take 1 tablet (3.125 mg total) by mouth 2 (two) times daily with a meal. 60 tablet 0  . digoxin (LANOXIN) 0.125 MG tablet Take 1 tablet (0.125 mg total) by mouth daily. 90 tablet 3  . ezetimibe (ZETIA) 10 MG tablet Take 10 mg by mouth daily.      . fenofibrate 160 MG tablet Take 160 mg by mouth daily.    . furosemide (LASIX) 40 MG tablet Take 1 tablet (40 mg total) by mouth daily. 30 tablet 0  . metFORMIN (GLUCOPHAGE-XR) 500 MG 24 hr tablet Take 500 mg by mouth 2 (two) times daily.     . Multiple Vitamin (MULTIVITAMIN) tablet Take 1 tablet by mouth daily.    . Omega-3 Fatty Acids (FISH OIL) 1200 MG CAPS Take 1 capsule by mouth 3 (three) times daily.     . pantoprazole (PROTONIX) 40 MG tablet Take 1 tablet (40 mg total) by mouth daily. 30 tablet 0  . pravastatin (PRAVACHOL) 40 MG tablet Take  80 mg by mouth daily.     . repaglinide (PRANDIN) 2 MG tablet Take 2 mg by mouth 2 (two) times daily before a meal.     . sacubitril-valsartan (ENTRESTO) 24-26 MG Take 1 tablet by mouth 2 (two) times daily. 60 tablet 0  . spironolactone (ALDACTONE) 25 MG tablet Take 1 tablet (25 mg total) by mouth daily. 30 tablet 0  . warfarin (COUMADIN) 7.5 MG tablet Take 1 tablet (7.5 mg total) by mouth daily.    Marland Kitchen linagliptin (TRADJENTA) 5 MG TABS tablet Take 5 mg by mouth daily.      No current facility-administered medications for this visit.      PHYSICAL EXAM: Vitals:   08/24/18 1508  BP: (!) 90/55  Pulse: 67  Resp: 18  Temp: (!) 97.4 F (36.3 C)  TempSrc: Temporal  SpO2: 96%  Weight: 151 lb (68.5 kg)  Height: 5\' 6"  (1.676 m)    GENERAL: The patient is a well-nourished male, in no acute distress. The vital signs are documented above. Neurologically intact.  Right neck incision healing without difficulty  MEDICAL ISSUES: Table overall.  We will see him again in 9 months with repeat carotid duplex.  He is to follow-up with cardiology   [name], MD [facility] Tel [phone] Pager [phone]

## 2018-09-02 ENCOUNTER — Other Ambulatory Visit: Payer: Self-pay

## 2018-09-02 ENCOUNTER — Encounter (HOSPITAL_COMMUNITY): Payer: Self-pay

## 2018-09-03 ENCOUNTER — Encounter (HOSPITAL_COMMUNITY)
Admission: RE | Admit: 2018-09-03 | Discharge: 2018-09-03 | Disposition: A | Discharge status: Home / Self Care | Payer: Medicare Other | Source: Ambulatory Visit | Attending: Ophthalmology | Admitting: Ophthalmology

## 2018-09-08 ENCOUNTER — Institutional Professional Consult (permissible substitution): Payer: Medicare Other | Admitting: Neurology

## 2018-09-08 ENCOUNTER — Other Ambulatory Visit (HOSPITAL_COMMUNITY)
Admission: RE | Admit: 2018-09-08 | Discharge: 2018-09-08 | Disposition: A | Discharge status: Home / Self Care | Payer: Medicare Other | Source: Ambulatory Visit | Attending: Ophthalmology | Admitting: Ophthalmology

## 2018-09-08 ENCOUNTER — Other Ambulatory Visit: Payer: Self-pay

## 2018-09-08 DIAGNOSIS — Z1159 Encounter for screening for other viral diseases: Secondary | ICD-10-CM | POA: Diagnosis present

## 2018-09-08 LAB — SARS CORONAVIRUS 2 (TAT 6-24 HRS): SARS Coronavirus 2: NEGATIVE

## 2018-09-09 ENCOUNTER — Other Ambulatory Visit: Payer: Self-pay | Admitting: Cardiology

## 2018-09-09 MED ORDER — SPIRONOLACTONE 25 MG PO TABS: 25.0000 mg | ORAL_TABLET | Freq: Every day | ORAL | 6 refills | Status: DC

## 2018-09-09 NOTE — Telephone Encounter (Signed)
Needing refill on his spironolactone (ALDACTONE) 25 MG tablet [[id number]] [facility] [place], [place] seen in office by B. Strader, see's Dr. Percival Spanish.

## 2018-09-09 NOTE — Telephone Encounter (Signed)
refilled aldactone as requested

## 2018-09-10 ENCOUNTER — Other Ambulatory Visit: Payer: Self-pay

## 2018-09-10 ENCOUNTER — Ambulatory Visit (HOSPITAL_COMMUNITY): Payer: Medicare Other | Admitting: Anesthesiology

## 2018-09-10 ENCOUNTER — Ambulatory Visit (HOSPITAL_COMMUNITY)
Admission: RE | Admit: 2018-09-10 | Discharge: 2018-09-10 | Disposition: A | Discharge status: Home / Self Care | Payer: Medicare Other | Attending: Ophthalmology | Admitting: Ophthalmology

## 2018-09-10 ENCOUNTER — Encounter (HOSPITAL_COMMUNITY): Payer: Self-pay

## 2018-09-10 ENCOUNTER — Encounter (HOSPITAL_COMMUNITY)
Admission: RE | Disposition: A | Discharge status: Home / Self Care | Payer: Self-pay | Source: Home / Self Care | Attending: Ophthalmology

## 2018-09-10 DIAGNOSIS — J449 Chronic obstructive pulmonary disease, unspecified: Secondary | ICD-10-CM | POA: Diagnosis not present

## 2018-09-10 DIAGNOSIS — M1990 Unspecified osteoarthritis, unspecified site: Secondary | ICD-10-CM | POA: Diagnosis not present

## 2018-09-10 DIAGNOSIS — Z880 Allergy status to penicillin: Secondary | ICD-10-CM | POA: Diagnosis not present

## 2018-09-10 DIAGNOSIS — I2510 Atherosclerotic heart disease of native coronary artery without angina pectoris: Secondary | ICD-10-CM | POA: Diagnosis not present

## 2018-09-10 DIAGNOSIS — E1136 Type 2 diabetes mellitus with diabetic cataract: Secondary | ICD-10-CM | POA: Diagnosis not present

## 2018-09-10 DIAGNOSIS — E7800 Pure hypercholesterolemia, unspecified: Secondary | ICD-10-CM | POA: Insufficient documentation

## 2018-09-10 DIAGNOSIS — H259 Unspecified age-related cataract: Secondary | ICD-10-CM | POA: Diagnosis not present

## 2018-09-10 DIAGNOSIS — I509 Heart failure, unspecified: Secondary | ICD-10-CM | POA: Insufficient documentation

## 2018-09-10 DIAGNOSIS — Z888 Allergy status to other drugs, medicaments and biological substances status: Secondary | ICD-10-CM | POA: Diagnosis not present

## 2018-09-10 DIAGNOSIS — D649 Anemia, unspecified: Secondary | ICD-10-CM | POA: Diagnosis not present

## 2018-09-10 DIAGNOSIS — Z87891 Personal history of nicotine dependence: Secondary | ICD-10-CM | POA: Diagnosis not present

## 2018-09-10 DIAGNOSIS — Z8673 Personal history of transient ischemic attack (TIA), and cerebral infarction without residual deficits: Secondary | ICD-10-CM | POA: Insufficient documentation

## 2018-09-10 DIAGNOSIS — I110 Hypertensive heart disease with heart failure: Secondary | ICD-10-CM | POA: Insufficient documentation

## 2018-09-10 DIAGNOSIS — I252 Old myocardial infarction: Secondary | ICD-10-CM | POA: Diagnosis not present

## 2018-09-10 HISTORY — PX: CATARACT EXTRACTION W/PHACO: SHX586

## 2018-09-10 SURGERY — PHACOEMULSIFICATION, CATARACT, WITH IOL INSERTION
Anesthesia: Monitor Anesthesia Care | Site: Eye | Laterality: Left

## 2018-09-10 MED ORDER — BSS IO SOLN
INTRAOCULAR | Status: DC | PRN
  Administered 2018-09-10: 15 mL

## 2018-09-10 MED ORDER — POVIDONE-IODINE 5 % OP SOLN
OPHTHALMIC | Status: DC | PRN
  Administered 2018-09-10: 1 via OPHTHALMIC

## 2018-09-10 MED ORDER — TETRACAINE HCL 0.5 % OP SOLN
1.0000 [drp] | OPHTHALMIC | Status: AC
  Administered 2018-09-10 (×3): 1 [drp] via OPHTHALMIC

## 2018-09-10 MED ORDER — NEOMYCIN-POLYMYXIN-DEXAMETH 3.5-10000-0.1 OP SUSP
OPHTHALMIC | Status: DC | PRN
  Administered 2018-09-10: 1 [drp] via OPHTHALMIC

## 2018-09-10 MED ORDER — LIDOCAINE HCL 3.5 % OP GEL
1.0000 "application " | Freq: Once | OPHTHALMIC | Status: AC
  Administered 2018-09-10: 1 via OPHTHALMIC

## 2018-09-10 MED ORDER — LIDOCAINE HCL (PF) 1 % IJ SOLN
INTRAOCULAR | Status: DC | PRN
  Administered 2018-09-10: 1 mL via OPHTHALMIC

## 2018-09-10 MED ORDER — CYCLOPENTOLATE-PHENYLEPHRINE 0.2-1 % OP SOLN
1.0000 [drp] | OPHTHALMIC | Status: AC
  Administered 2018-09-10 (×3): 1 [drp] via OPHTHALMIC

## 2018-09-10 MED ORDER — PROVISC 10 MG/ML IO SOLN
INTRAOCULAR | Status: DC | PRN
  Administered 2018-09-10: 0.85 mL via INTRAOCULAR

## 2018-09-10 MED ORDER — EPINEPHRINE PF 1 MG/ML IJ SOLN
INTRAOCULAR | Status: DC | PRN
  Administered 2018-09-10: 500 mL

## 2018-09-10 MED ORDER — EPINEPHRINE PF 1 MG/ML IJ SOLN
INTRAMUSCULAR | Status: AC
  Filled 2018-09-10: qty 2

## 2018-09-10 MED ORDER — PHENYLEPHRINE HCL 2.5 % OP SOLN
1.0000 [drp] | OPHTHALMIC | Status: AC
  Administered 2018-09-10 (×3): 1 [drp] via OPHTHALMIC

## 2018-09-10 MED ORDER — SODIUM HYALURONATE 23 MG/ML IO SOLN
INTRAOCULAR | Status: DC | PRN
  Administered 2018-09-10: 0.6 mL via INTRAOCULAR

## 2018-09-10 SURGICAL SUPPLY — 13 items

## 2018-09-10 NOTE — Transfer of Care (Signed)
Immediate Anesthesia Transfer of Care Note  Patient: Oscar [name]  Procedure(s) Performed: CATARACT EXTRACTION PHACO AND INTRAOCULAR LENS PLACEMENT LEFT EYE (Left Eye)  Patient Location: PACU  Anesthesia Type:MAC  Level of Consciousness: awake, alert  and oriented  Airway & Oxygen Therapy: Patient Spontanous Breathing  Post-op Assessment: Report given to RN and Post -op Vital signs reviewed and stable  Post vital signs: Reviewed and stable  Last Vitals:  Vitals Value Taken Time  BP    Temp    Pulse    Resp    SpO2      Last Pain:  Vitals:   09/10/18 1100  TempSrc: Oral  PainSc: 0-No pain      Patients Stated Pain Goal: 7 (90/30/09 2330)  Complications: No apparent anesthesia complications

## 2018-09-10 NOTE — Discharge Instructions (Signed)
Please discharge patient when stable, will follow up today with Dr. Analucia Hush at the [facility] office immediately following discharge.  Leave shield in place until visit.  All paperwork with discharge instructions will be given at the office. ° °

## 2018-09-10 NOTE — Anesthesia Postprocedure Evaluation (Signed)
Anesthesia Post Note  Patient: Oscar [name]  Procedure(s) Performed: CATARACT EXTRACTION PHACO AND INTRAOCULAR LENS PLACEMENT LEFT EYE (Left Eye)  Patient location during evaluation: PACU Anesthesia Type: MAC Level of consciousness: awake and alert Pain management: pain level controlled Vital Signs Assessment: post-procedure vital signs reviewed and stable Respiratory status: spontaneous breathing, nonlabored ventilation, respiratory function stable and patient connected to nasal cannula oxygen Cardiovascular status: stable and blood pressure returned to baseline Postop Assessment: no apparent nausea or vomiting Anesthetic complications: no     Last Vitals:  Vitals:   09/10/18 1100  BP: 119/68  Pulse: 87  Resp: 16  Temp: 36.7 C  SpO2: 95%    Last Pain:  Vitals:   09/10/18 1100  TempSrc: Oral  PainSc: 0-No pain                 Amorina Doerr

## 2018-09-10 NOTE — Op Note (Signed)
Date of procedure: 09/10/18  Pre-operative diagnosis: Visually significant age-related cataract, Left Eye (H25.812)  Post-operative diagnosis: Visually significant age-related cataract, Left Eye  Procedure: Removal of cataract via phacoemulsification and insertion of intra-ocular lens [employer] Vision PCB00  +23.5D into the capsular bag of the Left Eye  Attending surgeon: Gerda Diss. Rudine Rieger, MD, MA  Anesthesia: MAC, Topical Akten  Complications: None  Estimated Blood Loss: <32m (minimal)  Specimens: None  Implants: As above  Indications:  Visually significant age-related cataract, Left Eye  Procedure:  The patient was seen and identified in the pre-operative area. The operative eye was identified and dilated.  The operative eye was marked.  Topical anesthesia was administered to the operative eye.     The patient was then to the operative suite and placed in the supine position.  A timeout was performed confirming the patient, procedure to be performed, and all other relevant information.   The patient's face was prepped and draped in the usual fashion for intra-ocular surgery.  A lid speculum was placed into the operative eye and the surgical microscope moved into place and focused.  An inferotemporal paracentesis was created using a 20 gauge paracentesis blade.  Shugarcaine was injected into the anterior chamber.  Viscoelastic was injected into the anterior chamber.  A temporal clear-corneal main wound incision was created using a 2.45mmicrokeratome.  A continuous curvilinear capsulorrhexis was initiated using an irrigating cystitome and completed using capsulorrhexis forceps.  Hydrodissection and hydrodeliniation were performed.  Viscoelastic was injected into the anterior chamber.  A phacoemulsification handpiece and a chopper as a second instrument were used to remove the nucleus and epinucleus. The irrigation/aspiration handpiece was used to remove any remaining cortical  material.   The capsular bag was reinflated with viscoelastic, checked, and found to be intact.  The intraocular lens was inserted into the capsular bag and dialed into place using a Kuglen hook.  The irrigation/aspiration handpiece was used to remove any remaining viscoelastic.  The clear corneal wound and paracentesis wounds were then hydrated and checked with Weck-Cels to be watertight.  The lid-speculum and drape was removed, and the patient's face was cleaned with a wet and dry 4x4.  Maxitrol was instilled in the eye before a clear shield was taped over the eye. The patient was taken to the post-operative care unit in good condition, having tolerated the procedure well.  Post-Op Instructions: The patient will follow up at [facility] a same day post-operative evaluation and will receive all other orders and instructions.

## 2018-09-10 NOTE — H&P (Signed)
The H and P was reviewed and updated. The patient was examined.  No changes were found after exam.  The surgical eye was marked.  

## 2018-09-10 NOTE — Anesthesia Preprocedure Evaluation (Signed)
Anesthesia Evaluation  Patient identified by MRN, date of birth, ID band Patient awake    Reviewed: Allergy & Precautions, H&P , NPO status , Patient's Chart, lab work & pertinent test results, reviewed documented beta blocker date and time   Airway Mallampati: II   Neck ROM: full    Dental no notable dental hx.    Pulmonary COPD, former smoker,    Pulmonary exam normal        Cardiovascular + CAD and +CHF  Normal cardiovascular exam     Neuro/Psych TIA   GI/Hepatic   Endo/Other  diabetes  Renal/GU CRFRenal disease     Musculoskeletal   Abdominal   Peds  Hematology  (+) Blood dyscrasia, anemia ,   Anesthesia Other Findings   Reproductive/Obstetrics                             Anesthesia Physical Anesthesia Plan  ASA: II  Anesthesia Plan: MAC   Post-op Pain Management:    Induction:   PONV Risk Score and Plan: 1  Airway Management Planned:   Additional Equipment:   Intra-op Plan:   Post-operative Plan:   Informed Consent: I have reviewed the patients History and Physical, chart, labs and discussed the procedure including the risks, benefits and alternatives for the proposed anesthesia with the patient or authorized representative who has indicated his/her understanding and acceptance.       Plan Discussed with: Anesthesiologist  Anesthesia Plan Comments:         Anesthesia Quick Evaluation

## 2018-09-11 ENCOUNTER — Other Ambulatory Visit: Payer: Self-pay | Admitting: Cardiology

## 2018-09-13 ENCOUNTER — Encounter (HOSPITAL_COMMUNITY): Payer: Self-pay | Admitting: Ophthalmology

## 2018-09-21 ENCOUNTER — Other Ambulatory Visit (HOSPITAL_COMMUNITY): Payer: TRICARE For Life (TFL)

## 2018-10-01 ENCOUNTER — Telehealth: Payer: Self-pay | Admitting: Student

## 2018-10-01 MED ORDER — SACUBITRIL-VALSARTAN 24-26 MG PO TABS: 1.0000 | ORAL_TABLET | Freq: Two times a day (BID) | ORAL | 3 refills | Status: DC

## 2018-10-01 MED ORDER — SACUBITRIL-VALSARTAN 24-26 MG PO TABS: 1.0000 | ORAL_TABLET | Freq: Two times a day (BID) | ORAL | 0 refills | Status: DC

## 2018-10-01 NOTE — Telephone Encounter (Signed)
Pt requested 2 week supply entresto to [facility] in [place], and a refill also to [facility], done

## 2018-10-01 NOTE — Telephone Encounter (Signed)
Patient needs to speak with nurse regarding medications on hosp d/c and getting them refilled / tg

## 2018-10-05 ENCOUNTER — Other Ambulatory Visit: Payer: Self-pay | Admitting: Cardiology

## 2018-10-05 ENCOUNTER — Telehealth: Payer: Self-pay | Admitting: Cardiology

## 2018-10-05 ENCOUNTER — Telehealth: Payer: Self-pay | Admitting: Student

## 2018-10-05 NOTE — Telephone Encounter (Signed)
Oscar [name] talked to pt.

## 2018-10-05 NOTE — Telephone Encounter (Signed)
Will forward to [place], [place]. To advise.

## 2018-10-05 NOTE — Telephone Encounter (Signed)
Please advise if ok to refill Carvedilol 25 mg tablet bid.

## 2018-10-05 NOTE — Telephone Encounter (Signed)
Pt wanted office to call in a 2 wks supply of Entresto to [facility]. In doing so was told that the patient needs to [facility]. They received our order on 10/01/18.

## 2018-10-05 NOTE — Telephone Encounter (Signed)
Patient calling back in regards to previous message/tg

## 2018-10-05 NOTE — Telephone Encounter (Signed)
PER PT PHONE CALL - PT IS WANTING TO SPEAK W/ B. [facility] CONCERNING HIS sacubitril-valsartan (ENTRESTO) 24-26 MG [[id number]]

## 2018-10-07 NOTE — Progress Notes (Signed)
Cardiology Office Note   Date:  10/08/2018   ID:  MAZE CORNIEL, DOB November 25, 1940, MRN [id number]  PCP:  Adaline Sill, NP  Cardiologist:   Minus Breeding, MD   Chief Complaint  Patient presents with  . Coronary Artery Disease      History of Present Illness: Oscar [name] is a 78 y.o. male who presents for follow up of cardiomyopathy.  He was in the ED in May and an EF of 30% which was lower than previous.  A cath was performed by Dr. Haroldine Laws on 07/29/2018 and showed 100% Proximal-RCA stenosis, 99% Proximal-LAD, 75% Ost LM, 70% LCx, 40% PDA, and 30% Ost LCx. Cardiac MRI was performed to assess viability and showed a mixed picture with the mid anteroseptal/inferoseptal and the apical inferior and true apex wall segments unlikely to improve with revascularization however the other affected wall segments appeared viable. CT surgery was consulted but the patient declined CABG. Options regarding high risk PCI with atherectomy and Impella support were reviewed but the patient declined this as well and wished to pursue medical management. Also refused CEA during admission .  He presented back to the hospital in June with AMS.  He eventually did have CEA.     Past Medical History:  Diagnosis Date  . Anemia   . Atrial fibrillation ([place])    permanent  . CAD (coronary artery disease)    a. cath on 07/29/2018 showed 100% Proximal-RCA stenosis, 99% Proximal-LAD, 75% Ost LM, 70% LCx, 40% PDA, and 30% Ost LCx. Declined CABG  . Cardiomyopathy    EF 35% in the past--Improved to 55% echo 2/10. Moderately severe mitral regurgitation in the past, now improved,  . Carotid artery stenosis   . CHF (congestive heart failure) ([place]) 2002  . COPD (chronic obstructive pulmonary disease) ([place])   . Coronary artery disease    non obstructive  . Diabetes mellitus   . Dyslipidemia   . History of tobacco abuse   . Nephrolithiasis   . Tremors of nervous system     Past Surgical History:  Procedure  Laterality Date  . CAROTID ENDARTERECTOMY Left 10-21-05   cea  . CATARACT EXTRACTION W/PHACO Left 09/10/2018   Procedure: CATARACT EXTRACTION PHACO AND INTRAOCULAR LENS PLACEMENT LEFT EYE;  Surgeon: Baruch Goldmann, MD;  Location: [facility];  Service: Ophthalmology;  Laterality: Left;  left  . ENDARTERECTOMY Right 08/11/2018   Procedure: ENDARTERECTOMY CAROTID RIGHT;  Surgeon: [name], MD;  Location: [facility];  Service: Vascular;  Laterality: Right;  . left carotid endarterectomy    . PATCH ANGIOPLASTY Right 08/11/2018   Procedure: PATCH ANGIOPLAST USING HEMASHIELD PLATINUM FINESSE;  Surgeon: [name], MD;  Location: [place];  Service: Vascular;  Laterality: Right;  . RIGHT/LEFT HEART CATH AND CORONARY ANGIOGRAPHY N/A 07/29/2018   Procedure: RIGHT/LEFT HEART CATH AND CORONARY ANGIOGRAPHY;  Surgeon: Jolaine Artist, MD;  Location: [facility];  Service: Cardiovascular;  Laterality: N/A;  . TONSILLECTOMY       Current Outpatient Medications  Medication Sig Dispense Refill  . acetaminophen (TYLENOL) 325 MG tablet Take 2 tablets (650 mg total) by mouth every 6 (six) hours as needed for mild pain (or Fever >/= 101). 30 tablet 0  . albuterol (PROAIR HFA) 108 (90 Base) MCG/ACT inhaler Inhale 2 puffs into the lungs every 6 (six) hours as needed for wheezing or shortness of breath.    Marland Kitchen aspirin EC 81 MG EC tablet Take 1 tablet (81  mg total) by mouth daily. 30 tablet 0  . carvedilol (COREG) 6.25 MG tablet Take 1 tablet (6.25 mg total) by mouth 2 (two) times daily with a meal. 180 tablet 3  . digoxin (LANOXIN) 0.125 MG tablet TAKE 1 TABLET DAILY 90 tablet 3  . ezetimibe (ZETIA) 10 MG tablet Take 10 mg by mouth daily.      . fenofibrate 160 MG tablet Take 160 mg by mouth daily.    . furosemide (LASIX) 40 MG tablet Take 1 tablet (40 mg total) by mouth daily. 30 tablet 0  . linagliptin (TRADJENTA) 5 MG TABS tablet Take 5 mg by mouth daily.     . metFORMIN (GLUCOPHAGE-XR) 500 MG 24 hr tablet Take  500 mg by mouth 2 (two) times daily.     . Multiple Vitamin (MULTIVITAMIN) tablet Take 1 tablet by mouth daily.    . Omega-3 Fatty Acids (FISH OIL) 1200 MG CAPS Take 1 capsule by mouth 3 (three) times daily.     . pantoprazole (PROTONIX) 40 MG tablet Take 1 tablet (40 mg total) by mouth daily. 30 tablet 0  . pravastatin (PRAVACHOL) 40 MG tablet Take 80 mg by mouth daily.     . repaglinide (PRANDIN) 2 MG tablet Take 2 mg by mouth 2 (two) times daily before a meal.     . sacubitril-valsartan (ENTRESTO) 24-26 MG Take 1 tablet by mouth 2 (two) times daily. 28 tablet 0  . spironolactone (ALDACTONE) 25 MG tablet Take 1 tablet (25 mg total) by mouth daily. 30 tablet 6  . warfarin (COUMADIN) 7.5 MG tablet Take 1 tablet (7.5 mg total) by mouth daily.     No current facility-administered medications for this visit.     Allergies:   Penicillins and Statins    ROS:  Please see the history of present illness.   Otherwise, review of systems are positive for none.   All other systems are reviewed and negative.    PHYSICAL EXAM: VS:  BP 132/78   Pulse 77   Temp 98.4 F (36.9 C)   Ht 5\' 6"  (1.676 m)   Wt 154 lb 12.8 oz (70.2 kg)   SpO2 95%   BMI 24.99 kg/m  , BMI Body mass index is 24.99 kg/m. GENERAL:  Well appearing NECK:  No jugular venous distention, waveform within normal limits, carotid upstroke brisk and symmetric, no bruits, no thyromegaly LUNGS:  Clear to auscultation bilaterally CHEST:  Unremarkable HEART:  PMI not displaced or sustained,S1 and S2 within normal limits, no S3, no S4, no clicks, no rubs, no murmurs ABD:  Flat, positive bowel sounds normal in frequency in pitch, no bruits, no rebound, no guarding, no midline pulsatile mass, no hepatomegaly, no splenomegaly EXT:  2 plus pulses throughout, no edema, no cyanosis no clubbing   EKG:  EKG is not ordered today.   Recent Labs: 07/21/2018: B Natriuretic Peptide 2,433.0 08/08/2018: Magnesium 2.0 08/11/2018: ALT 32 08/12/2018:  BUN 36; Creatinine, Ser 1.28; Hemoglobin 11.2; Platelets 552; Potassium 4.7; Sodium 132    Lipid Panel    Component Value Date/Time   CHOL 95 07/25/2018 0443   TRIG 113 07/25/2018 0443   HDL 17 (L) 07/25/2018 0443   CHOLHDL 5.6 07/25/2018 0443   VLDL 23 07/25/2018 0443   LDLCALC 55 07/25/2018 0443      Wt Readings from Last 3 Encounters:  10/08/18 154 lb 12.8 oz (70.2 kg)  08/24/18 151 lb (68.5 kg)  08/20/18 154 lb (69.9 kg)  Other studies Reviewed: Additional studies/ records that were reviewed today include: Cath films reviewed. Review of the above records demonstrates:  Please see elsewhere in the note.     ASSESSMENT AND PLAN:  CAD:    He has severe CAD and I reviewed his films today.  He really does not have bypass.  He would consider PCI if so I will have these films reviewed by some of our interventional cardiologist.  As previously mentioned this to be high risk.  In the meantime we will have to try to titrate medications.  CHRONIC COMBINED SYSTOLIC AND DIASTOLIC HF:   He is euvolemic.  I will go up on his carvedilol to 6.25 mg twice daily.  I will have him come back to the clinic in [place] in a couple of weeks to see if we can titrate meds further.  We need to get his Entresto restarted.  There apparently is some recent blood work that we should try to find from his primary physician.  I would like to see him back in 1 month in [place] to further titrate meds and talk about possible angioplasty.  ATRIAL FIB: He tolerates anticoagulation.  He does not feel this rhythm.  No change in therapy.  HTN: Blood pressures been decreasing in the context of managing his cardiomyopathy and his coronary disease.  HYPERLIPIDEMIA: He will remain on meds as listed.  LDL 65.    Current medicines are reviewed at length with the patient today.  The patient does not have concerns regarding medicines.  The following changes have been made:  As above  Labs/ tests ordered today  include: None No orders of the defined types were placed in this encounter.    Disposition:   FU with Bernerd Pho PA in two weeks.    Signed, Minus Breeding, MD  10/08/2018 5:42 PM    [facility]

## 2018-10-08 ENCOUNTER — Ambulatory Visit (INDEPENDENT_AMBULATORY_CARE_PROVIDER_SITE_OTHER): Payer: Medicare Other | Admitting: Cardiology

## 2018-10-08 ENCOUNTER — Encounter: Payer: Self-pay | Admitting: Cardiology

## 2018-10-08 ENCOUNTER — Other Ambulatory Visit: Payer: Self-pay

## 2018-10-08 VITALS — BP 132/78 | HR 77 | Temp 98.4°F | Ht 66.0 in | Wt 154.8 lb

## 2018-10-08 DIAGNOSIS — I5042 Chronic combined systolic (congestive) and diastolic (congestive) heart failure: Secondary | ICD-10-CM

## 2018-10-08 DIAGNOSIS — I2510 Atherosclerotic heart disease of native coronary artery without angina pectoris: Secondary | ICD-10-CM

## 2018-10-08 DIAGNOSIS — E785 Hyperlipidemia, unspecified: Secondary | ICD-10-CM

## 2018-10-08 DIAGNOSIS — I10 Essential (primary) hypertension: Secondary | ICD-10-CM | POA: Diagnosis not present

## 2018-10-08 DIAGNOSIS — I4821 Permanent atrial fibrillation: Secondary | ICD-10-CM | POA: Diagnosis not present

## 2018-10-08 MED ORDER — CARVEDILOL 6.25 MG PO TABS: 6.2500 mg | ORAL_TABLET | Freq: Two times a day (BID) | ORAL | 3 refills | Status: DC

## 2018-10-08 NOTE — Patient Instructions (Signed)
Medication Instructions:  INCREASE YOUR CARVEDILOL TO 6.25 MG TWICE A DAY   RESTART ENTRESTO  If you need a refill on your cardiac medications before your next appointment, please call your pharmacy.   Lab work: WILL REQUEST LAB FROM YOUR PRIMARY CARE   Testing/Procedures: NONE  Follow-Up: Your physician recommends that you schedule a follow-up appointment in: [place] OFFICE  Your physician recommends that you schedule a follow-up appointment in: [place]

## 2018-10-14 ENCOUNTER — Institutional Professional Consult (permissible substitution): Payer: Medicare Other | Admitting: Neurology

## 2018-10-26 ENCOUNTER — Telehealth: Payer: Self-pay | Admitting: *Deleted

## 2018-10-26 ENCOUNTER — Institutional Professional Consult (permissible substitution): Payer: Medicare Other | Admitting: Neurology

## 2018-10-26 ENCOUNTER — Encounter: Payer: Self-pay | Admitting: Neurology

## 2018-10-26 NOTE — Telephone Encounter (Signed)
No showed consultation appointment. 

## 2018-10-28 NOTE — H&P (Signed)
Surgical History & Physical  Patient Name: Oscar [name] DOB: 1940-07-15  Surgery: Cataract extraction with intraocular lens implant phacoemulsification; Right Eye  Surgeon: Baruch Goldmann MD Surgery Date:  11/12/2018 Pre-Op Date:  10/28/2018  HPI: [name] patient 1. The patient complains of difficulty when viewing TV, reading closed caption, news scrolls on TV, which began 3 months ago. The right eye is affected. The episode is gradual. The condition's severity increased since last visit. Symptoms occur when the patient is inside and outside. 2. The patient is returning after cataract post-op. The left eye is affected. Status post cataract post-op, which began 1 week ago: Since the last visit, the affected area feels improvement. The patient's vision is improved. Patient is following medication instructions.  Medical History: Cataracts Arthritis Diabetes Heart Problem High Blood Pressure LDL Lung Problems  Review of Systems Allergic/Immunologic Seasonal Allergies All recorded systems are negative except as noted above.  Social   Former smoker   Medication Vigamox, Ilevro, Prednisolone acetate 1%,  Albuterol, Amlodipine Besylate, Coreg, Digoxin, Ezetimibe, Fenofibrate, Fish Oil, Furosemide, Glipizide, Ipratropium Bromide, Lisinopril, Metformin, Pravastatin, Repaglidine, Tradjenta, Trueplus lacets, Warfarin,   Sx/Procedures Phaco c IOL OS,   Drug Allergies  Statins, Penicillin,   History & Physical: Heent:  Cataract, Right eye NECK: supple without bruits LUNGS: lungs clear to auscultation CV: regular rate and rhythm Abdomen: soft and non-tender  Impression & Plan: Assessment: 1.  CATARACT EXTRACTION STATUS; Left Eye (Z98.42) 2.  COMBINED FORMS AGE RELATED CATARACT; , Right Eye (H25.811)  Plan: 1.  1 week after cataract surgery. Doing well with improved vision and normal eye pressure. Call with any problems or concerns. Stop Vigamox. Continue Ilevro 1 drop  1x/day for 3 more weeks. Continue Pred Acetate 1 drop 2x/day for 3 more weeks. 2.  Cataract accounts for the patient's decreased vision. This visual impairment is not correctable with a tolerable change in glasses or contact lenses. Cataract surgery with an implantation of a new lens should significantly improve the visual and functional status of the patient. Discussed all risks, benefits, alternatives, and potential complications. Discussed the procedures and recovery. Patient desires to have surgery. A-scan ordered and performed today for intra-ocular lens calculations. The surgery will be performed in order to improve vision for driving, reading, and for eye examinations. Recommend phacoemulsification with intra-ocular lens. Right Eye. Surgery required to correct imbalance of vision. Dilates moderately - shugarcaine by protocol.

## 2018-11-09 ENCOUNTER — Encounter (HOSPITAL_COMMUNITY)
Admission: RE | Admit: 2018-11-09 | Discharge: 2018-11-09 | Disposition: A | Discharge status: Home / Self Care | Payer: Medicare Other | Source: Ambulatory Visit | Attending: Ophthalmology | Admitting: Ophthalmology

## 2018-11-09 ENCOUNTER — Other Ambulatory Visit (HOSPITAL_COMMUNITY)
Admission: RE | Admit: 2018-11-09 | Discharge: 2018-11-09 | Disposition: A | Discharge status: Home / Self Care | Payer: Medicare Other | Source: Ambulatory Visit | Attending: Ophthalmology | Admitting: Ophthalmology

## 2018-11-09 ENCOUNTER — Other Ambulatory Visit: Payer: Self-pay

## 2018-11-09 DIAGNOSIS — Z01812 Encounter for preprocedural laboratory examination: Secondary | ICD-10-CM | POA: Diagnosis not present

## 2018-11-09 DIAGNOSIS — Z20828 Contact with and (suspected) exposure to other viral communicable diseases: Secondary | ICD-10-CM | POA: Diagnosis not present

## 2018-11-09 LAB — SARS CORONAVIRUS 2 (TAT 6-24 HRS): SARS Coronavirus 2: NEGATIVE

## 2018-11-12 ENCOUNTER — Ambulatory Visit (HOSPITAL_COMMUNITY): Payer: Medicare Other | Admitting: Anesthesiology

## 2018-11-12 ENCOUNTER — Encounter (HOSPITAL_COMMUNITY)
Admission: RE | Disposition: A | Discharge status: Home / Self Care | Payer: Self-pay | Source: Home / Self Care | Attending: Ophthalmology

## 2018-11-12 ENCOUNTER — Ambulatory Visit (HOSPITAL_COMMUNITY)
Admission: RE | Admit: 2018-11-12 | Discharge: 2018-11-12 | Disposition: A | Discharge status: Home / Self Care | Payer: Medicare Other | Attending: Ophthalmology | Admitting: Ophthalmology

## 2018-11-12 DIAGNOSIS — Z8673 Personal history of transient ischemic attack (TIA), and cerebral infarction without residual deficits: Secondary | ICD-10-CM | POA: Diagnosis not present

## 2018-11-12 DIAGNOSIS — E1136 Type 2 diabetes mellitus with diabetic cataract: Secondary | ICD-10-CM | POA: Diagnosis present

## 2018-11-12 DIAGNOSIS — Z87891 Personal history of nicotine dependence: Secondary | ICD-10-CM | POA: Insufficient documentation

## 2018-11-12 DIAGNOSIS — Z79899 Other long term (current) drug therapy: Secondary | ICD-10-CM | POA: Diagnosis not present

## 2018-11-12 DIAGNOSIS — I2510 Atherosclerotic heart disease of native coronary artery without angina pectoris: Secondary | ICD-10-CM | POA: Diagnosis not present

## 2018-11-12 DIAGNOSIS — Z7901 Long term (current) use of anticoagulants: Secondary | ICD-10-CM | POA: Diagnosis not present

## 2018-11-12 DIAGNOSIS — H259 Unspecified age-related cataract: Secondary | ICD-10-CM | POA: Diagnosis not present

## 2018-11-12 DIAGNOSIS — I509 Heart failure, unspecified: Secondary | ICD-10-CM | POA: Diagnosis not present

## 2018-11-12 DIAGNOSIS — I4891 Unspecified atrial fibrillation: Secondary | ICD-10-CM | POA: Insufficient documentation

## 2018-11-12 DIAGNOSIS — I110 Hypertensive heart disease with heart failure: Secondary | ICD-10-CM | POA: Diagnosis not present

## 2018-11-12 DIAGNOSIS — J449 Chronic obstructive pulmonary disease, unspecified: Secondary | ICD-10-CM | POA: Diagnosis not present

## 2018-11-12 DIAGNOSIS — Z7984 Long term (current) use of oral hypoglycemic drugs: Secondary | ICD-10-CM | POA: Diagnosis not present

## 2018-11-12 HISTORY — PX: CATARACT EXTRACTION W/PHACO: SHX586

## 2018-11-12 LAB — GLUCOSE, CAPILLARY: Glucose-Capillary: 139 mg/dL — ABNORMAL HIGH (ref 70–99)

## 2018-11-12 SURGERY — PHACOEMULSIFICATION, CATARACT, WITH IOL INSERTION
Anesthesia: Monitor Anesthesia Care | Site: Eye | Laterality: Right

## 2018-11-12 MED ORDER — LIDOCAINE HCL (PF) 1 % IJ SOLN
INTRAOCULAR | Status: DC | PRN
  Administered 2018-11-12: 09:00:00 1 mL via OPHTHALMIC

## 2018-11-12 MED ORDER — PROVISC 10 MG/ML IO SOLN
INTRAOCULAR | Status: DC | PRN
  Administered 2018-11-12: 0.85 mL via INTRAOCULAR

## 2018-11-12 MED ORDER — BSS IO SOLN
INTRAOCULAR | Status: DC | PRN
  Administered 2018-11-12: 15 mL

## 2018-11-12 MED ORDER — EPINEPHRINE PF 1 MG/ML IJ SOLN
INTRAOCULAR | Status: DC | PRN
  Administered 2018-11-12: 09:00:00 500 mL

## 2018-11-12 MED ORDER — POVIDONE-IODINE 5 % OP SOLN
OPHTHALMIC | Status: DC | PRN
  Administered 2018-11-12: 1 via OPHTHALMIC

## 2018-11-12 MED ORDER — SODIUM HYALURONATE 23 MG/ML IO SOLN
INTRAOCULAR | Status: DC | PRN
  Administered 2018-11-12: 0.6 mL via INTRAOCULAR

## 2018-11-12 MED ORDER — NEOMYCIN-POLYMYXIN-DEXAMETH 3.5-10000-0.1 OP SUSP
OPHTHALMIC | Status: DC | PRN
  Administered 2018-11-12: 1 [drp] via OPHTHALMIC

## 2018-11-12 MED ORDER — CYCLOPENTOLATE-PHENYLEPHRINE 0.2-1 % OP SOLN
1.0000 [drp] | OPHTHALMIC | Status: AC | PRN
  Administered 2018-11-12 (×3): 1 [drp] via OPHTHALMIC

## 2018-11-12 MED ORDER — PHENYLEPHRINE HCL 2.5 % OP SOLN
1.0000 [drp] | OPHTHALMIC | Status: AC | PRN
  Administered 2018-11-12 (×3): 1 [drp] via OPHTHALMIC

## 2018-11-12 MED ORDER — LIDOCAINE HCL 3.5 % OP GEL
1.0000 "application " | Freq: Once | OPHTHALMIC | Status: AC
  Administered 2018-11-12: 1 via OPHTHALMIC

## 2018-11-12 MED ORDER — TETRACAINE HCL 0.5 % OP SOLN
1.0000 [drp] | OPHTHALMIC | Status: AC | PRN
  Administered 2018-11-12 (×3): 1 [drp] via OPHTHALMIC

## 2018-11-12 SURGICAL SUPPLY — 13 items

## 2018-11-12 NOTE — Discharge Instructions (Signed)
Please discharge patient when stable, will follow up today with Dr. Yahel Fuston at the [facility] office immediately following discharge.  Leave shield in place until visit.  All paperwork with discharge instructions will be given at the office. ° °

## 2018-11-12 NOTE — Op Note (Signed)
Date of procedure: 11/12/18  Pre-operative diagnosis: Visually significant age-related cataract, Right Eye (H25.811)  Post-operative diagnosis: Visually significant age-related cataract, Right Eye  Procedure: Removal of cataract via phacoemulsification and insertion of intra-ocular lens Oscar [name] [employer] PCB00  +23.5D into the capsular bag of the Right Eye  Attending surgeon: Gerda Diss. Yamin Swingler, MD, MA  Anesthesia: MAC, Topical Akten  Complications: None  Estimated Blood Loss: <35m (minimal)  Specimens: None  Implants: As above  Indications:  Visually significant age-related cataract, Right Eye  Procedure:  The patient was seen and identified in the pre-operative area. The operative eye was identified and dilated.  The operative eye was marked.  Topical anesthesia was administered to the operative eye.     The patient was then to the operative suite and placed in the supine position.  A timeout was performed confirming the patient, procedure to be performed, and all other relevant information.   The patient's face was prepped and draped in the usual fashion for intra-ocular surgery.  A lid speculum was placed into the operative eye and the surgical microscope moved into place and focused.  A superotemporal paracentesis was created using a 20 gauge paracentesis blade.  Shugarcaine was injected into the anterior chamber.  Viscoelastic was injected into the anterior chamber.  A temporal clear-corneal main wound incision was created using a 2.472mmicrokeratome.  A continuous curvilinear capsulorrhexis was initiated using an irrigating cystitome and completed using capsulorrhexis forceps.  Hydrodissection and hydrodeliniation were performed.  Viscoelastic was injected into the anterior chamber.  A phacoemulsification handpiece and a chopper as a second instrument were used to remove the nucleus and epinucleus. The irrigation/aspiration handpiece was used to remove any remaining cortical  material.   The capsular bag was reinflated with viscoelastic, checked, and found to be intact.  The intraocular lens was inserted into the capsular bag.  The irrigation/aspiration handpiece was used to remove any remaining viscoelastic.  The clear corneal wound and paracentesis wounds were then hydrated and checked with Weck-Cels to be watertight.  The lid-speculum and drape was removed, and the patient's face was cleaned with a wet and dry 4x4.  Maxitrol was instilled in the eye before a clear shield was taped over the eye. The patient was taken to the post-operative care unit in good condition, having tolerated the procedure well.  Post-Op Instructions: [facility] and will receive all other orders and instructions.

## 2018-11-12 NOTE — Interval H&P Note (Signed)
History and Physical Interval Note: The H and P was reviewed and updated. The patient was examined.  No changes were found after exam.  The surgical eye was marked.   11/12/2018 8:23 AM  Oscar [name]  has presented today for surgery, with the diagnosis of nuclear cataract right eye.  The various methods of treatment have been discussed with the patient and family. After consideration of risks, benefits and other options for treatment, the patient has consented to  Procedure(s) with comments: CATARACT EXTRACTION PHACO AND INTRAOCULAR LENS PLACEMENT RIGHT EYE (Right) - right as a surgical intervention.  The patient's history has been reviewed, patient examined, no change in status, stable for surgery.  I have reviewed the patient's chart and labs.  Questions were answered to the patient's satisfaction.     Baruch Goldmann

## 2018-11-12 NOTE — Anesthesia Postprocedure Evaluation (Signed)
Anesthesia Post Note  Patient: Oscar [name]  Procedure(s) Performed: CATARACT EXTRACTION PHACO AND INTRAOCULAR LENS PLACEMENT RIGHT EYE (Right Eye)  Patient location during evaluation: Short Stay Anesthesia Type: MAC Level of consciousness: awake Pain management: pain level controlled Vital Signs Assessment: post-procedure vital signs reviewed and stable Respiratory status: spontaneous breathing Cardiovascular status: stable Postop Assessment: no apparent nausea or vomiting and able to ambulate Anesthetic complications: no     Last Vitals:  Vitals:   11/12/18 0715  BP: 120/63  Resp: 20  Temp: 36.7 C  SpO2: 98%    Last Pain:  Vitals:   11/12/18 0715  PainSc: 0-No pain                 Aaden Buckman [place]

## 2018-11-12 NOTE — Transfer of Care (Signed)
Immediate Anesthesia Transfer of Care Note  Patient: Oscar [name]  Procedure(s) Performed: CATARACT EXTRACTION PHACO AND INTRAOCULAR LENS PLACEMENT RIGHT EYE (Right Eye)  Patient Location: Short Stay  Anesthesia Type:MAC  Level of Consciousness: awake  Airway & Oxygen Therapy: Patient Spontanous Breathing  Post-op Assessment: Report given to RN  Post vital signs: Reviewed  Last Vitals:  Vitals Value Taken Time  BP    Temp    Pulse    Resp    SpO2      Last Pain:  Vitals:   11/12/18 0715  PainSc: 0-No pain         Complications: No apparent anesthesia complications

## 2018-11-12 NOTE — Anesthesia Preprocedure Evaluation (Addendum)
Anesthesia Evaluation  Patient identified by MRN, date of birth, ID band Patient awake    Reviewed: Allergy & Precautions, NPO status , Patient's Chart, lab work & pertinent test results, reviewed documented beta blocker date and time   History of Anesthesia Complications Negative for: history of anesthetic complications  Airway Mallampati: III  TM Distance: >3 FB Neck ROM: Full    Dental  (+) Lower Dentures, Upper Dentures   Pulmonary COPD,  COPD inhaler, former smoker,    Pulmonary exam normal breath sounds clear to auscultation       Cardiovascular METS (patient walks to the grocery store and takes care of himself): hypertension, Pt. on medications and Pt. on home beta blockers + CAD and +CHF  Normal cardiovascular exam Rhythm:Regular Rate:Normal + Systolic murmurs [id number] 08:18:43 [facility] System-NLD ROUTINE RECORD Atrial fibrillation Borderline right axis deviation Anteroseptal infarct, old Borderline repolarization abnormality 37m/s 131mmV _0  9.0.4 CID: [id number]  1. The left ventricle has severely reduced systolic function, with an ejection fraction of 25-30%. The cavity size was mildly dilated. Left ventricular diastolic Doppler parameters are indeterminate.  2. LVEF is severly depressed Only base of ventricle moves well.  3. The right ventricle has moderately reduced systolic function. The cavity was mildly enlarged. There is not assessed.  4. Left atrial size was severely dilated.  5. Right atrial size was severely dilated.  6. Mildly thickened tricuspid valve leaflets.  7. The mitral valve is abnormal. Mild thickening of the mitral valve leaflet. There is mild mitral annular calcification present.  8. The tricuspid valve is abnormal.  9. The aortic valve is tricuspid. Mild thickening of the aortic valve. Mild calcification of the aortic valve. Aortic valve regurgitation is mild by color flow  Doppler. 10. The inferior vena cava was dilated in size with <50% respiratory variability. 11. The interatrial septum was not assessed.  Cardiac cath - 07/2018  Assessment: 1. Severe 3v CAD in left dominant system with 70-80% LM lesion and 99% proximal LAD lesion 2. Ischemic CM EF 25% 3. Mild to moderate pulmonary HTN 4. Moderately reduced CO  Plan/Discussion:  Will d/w with TCTS regarding possible CABG and Maze. If not surgical candidate will need PCI of LAD with atherectomy +/- LM PCI.   I discussed with his wife by phone as well.  DaGlori BickersMD  5:57 PM   Neuro/Psych TIA   GI/Hepatic negative GI ROS, Neg liver ROS,   Endo/Other  diabetes, Well Controlled, Type 2  Renal/GU Renal InsufficiencyRenal disease     Musculoskeletal  (+) Arthritis , Osteoarthritis,    Abdominal   Peds  Hematology  (+) Blood dyscrasia (Thrombocytosis), anemia ,   Anesthesia Other Findings   Reproductive/Obstetrics                            Anesthesia Physical Anesthesia Plan  ASA: IV  Anesthesia Plan: MAC   Post-op Pain Management:    Induction:   PONV Risk Score and Plan: 0  Airway Management Planned: Natural Airway and Nasal Cannula  Additional Equipment:   Intra-op Plan:   Post-operative Plan:   Informed Consent: I have reviewed the patients History and Physical, chart, labs and discussed the procedure including the risks, benefits and alternatives for the proposed anesthesia with the patient or authorized representative who has indicated his/her understanding and acceptance.     Dental advisory given  Plan Discussed with: CRNA  Anesthesia Plan Comments: (Reviewed all  cardiology notes regarding his CAD status and discussed possible risk of heart attack perioperatively, patient had left eye cataract done in 08/2018 without any complications.)        Anesthesia Quick Evaluation

## 2018-11-15 ENCOUNTER — Encounter (HOSPITAL_COMMUNITY): Payer: Self-pay | Admitting: Ophthalmology

## 2018-11-23 ENCOUNTER — Ambulatory Visit (INDEPENDENT_AMBULATORY_CARE_PROVIDER_SITE_OTHER): Payer: Medicare Other | Admitting: Student

## 2018-11-23 ENCOUNTER — Encounter: Payer: Self-pay | Admitting: Student

## 2018-11-23 ENCOUNTER — Other Ambulatory Visit: Payer: Self-pay

## 2018-11-23 VITALS — BP 108/61 | HR 79 | Temp 97.6°F | Ht 66.0 in | Wt 154.0 lb

## 2018-11-23 DIAGNOSIS — I10 Essential (primary) hypertension: Secondary | ICD-10-CM | POA: Diagnosis not present

## 2018-11-23 DIAGNOSIS — Z79899 Other long term (current) drug therapy: Secondary | ICD-10-CM | POA: Diagnosis not present

## 2018-11-23 DIAGNOSIS — I2510 Atherosclerotic heart disease of native coronary artery without angina pectoris: Secondary | ICD-10-CM

## 2018-11-23 DIAGNOSIS — I5042 Chronic combined systolic (congestive) and diastolic (congestive) heart failure: Secondary | ICD-10-CM | POA: Diagnosis not present

## 2018-11-23 DIAGNOSIS — E785 Hyperlipidemia, unspecified: Secondary | ICD-10-CM

## 2018-11-23 DIAGNOSIS — I6521 Occlusion and stenosis of right carotid artery: Secondary | ICD-10-CM

## 2018-11-23 DIAGNOSIS — I4821 Permanent atrial fibrillation: Secondary | ICD-10-CM

## 2018-11-23 NOTE — Patient Instructions (Signed)
Medication Instructions:  Your physician recommends that you continue on your current medications as directed. Please refer to the Current Medication list given to you today.  If you need a refill on your cardiac medications before your next appointment, please call your pharmacy.   Lab work: Your physician recommends that you return for lab work at PCP   If you have labs (blood work) drawn today and your tests are completely normal, you will receive your results only by: [place] MyChart Message (if you have MyChart) OR . A paper copy in the mail If you have any lab test that is abnormal or we need to change your treatment, we will call you to review the results.  Testing/Procedures: NONE   Follow-Up: At [facility], you and your health needs are our priority.  As part of our continuing mission to provide you with exceptional heart care, we have created designated Provider Care Teams.  These Care Teams include your primary Cardiologist (physician) and Advanced Practice Providers (APPs -  Physician Assistants and Nurse Practitioners) who all work together to provide you with the care you need, when you need it. You will need a follow up appointment in 1 months.  Please call our office 2 months in advance to schedule this appointment.  You may see Minus Breeding, MD or one of the following Advanced Practice Providers on your designated Care Team:   Bernerd Pho, PA-C [facility]) . Ermalinda Barrios, PA-C ([place])  Any Other Special Instructions Will Be Listed Below (If Applicable). Thank you for choosing [facility]!

## 2018-11-23 NOTE — Progress Notes (Signed)
Cardiology Office Note    Date:  11/23/2018   ID:  Oscar [name], DOB 06/06/1940, MRN [id number]  PCP:  Adaline Sill, NP  Cardiologist: Minus Breeding, MD    Chief Complaint  Patient presents with   Follow-up    1 month visit    History of Present Illness:    Oscar [name] is a 78 y.o. male with past medical history of CAD (catheterization in 07/2018 showing 100% Proximal-RCA stenosis, 99% Proximal-LAD, 75% Ost LM, 70% LCx, 40% PDA, and 30% Ost LCx with patient declining CABG along with declining arthrectomy with Impella support), chronic combined systolic and diastolic CHF EF [id number] by echo in 07/2018), permanent atrial fibrillation, carotid artery stenosis (s/p L CEA in 2007 and R CEA in 07/2018), HTN and HLD who presents to the office today for 22-month follow-up.  He was last examined by Dr. Percival Spanish in 09/2018 and denied any chest pain or dyspnea at that time. He still declined CABG but said he would think about PCI in the future. Continued titration of his medication regimen was recommended and Coreg was increased to 6.25mg  BID and he was continued on ASA, Entresto (listed as not taking at that time), Digoxin, Spironolactone, Coumadin, and Pravastatin.   In talking with the patient today, he reports overall doing well since his last office visit. Says that his breathing is the best that it has actually been in several years. He denies any recent chest pain or dyspnea on exertion. Recently underwent cataract removal and is pleased with the results. He denies any recent orthopnea, PND, or lower extremity edema. Weight has overall been stable on his home scales between 143 - 145 lbs (at 154 lbs on office scales which is consistent with prior visits).   He does try to follow a low-sodium diet and also monitors his fluid intake. Reports frequent urination with Lasix. He says that he had been without Entresto for a few days due to complications with his pharmacy at the time of his  last office visit but he has been on medication since 07/2018.   Past Medical History:  Diagnosis Date   Anemia    Atrial fibrillation (HCC)    permanent   CAD (coronary artery disease)    a. cath on 07/29/2018 showed 100% Proximal-RCA stenosis, 99% Proximal-LAD, 75% Ost LM, 70% LCx, 40% PDA, and 30% Ost LCx. Declined CABG   Cardiomyopathy    EF 35% in the past--Improved to 55% echo 2/10. Moderately severe mitral regurgitation in the past, now improved,   Carotid artery stenosis    CHF (congestive heart failure) ([place]) 2002   COPD (chronic obstructive pulmonary disease) ([place])    Coronary artery disease    non obstructive   Diabetes mellitus    Dyslipidemia    History of tobacco abuse    Nephrolithiasis    Tremors of nervous system     Past Surgical History:  Procedure Laterality Date   CAROTID ENDARTERECTOMY Left 10-21-05   cea   CATARACT EXTRACTION W/PHACO Left 09/10/2018   Procedure: CATARACT EXTRACTION PHACO AND INTRAOCULAR LENS PLACEMENT LEFT EYE;  Surgeon: Baruch Goldmann, MD;  Location: [facility];  Service: Ophthalmology;  Laterality: Left;  left   CATARACT EXTRACTION W/PHACO Right 11/12/2018   Procedure: CATARACT EXTRACTION PHACO AND INTRAOCULAR LENS PLACEMENT RIGHT EYE;  Surgeon: Baruch Goldmann, MD;  Location: [facility];  Service: Ophthalmology;  Laterality: Right;  right   ENDARTERECTOMY Right 08/11/2018   Procedure: ENDARTERECTOMY CAROTID RIGHT;  Surgeon:  [name], MD;  Location: [facility] OR;  Service: Vascular;  Laterality: Right;   left carotid endarterectomy     PATCH ANGIOPLASTY Right 08/11/2018   Procedure: PATCH ANGIOPLAST USING HEMASHIELD PLATINUM FINESSE;  Surgeon: [name], MD;  Location: [facility] OR;  Service: Vascular;  Laterality: Right;   RIGHT/LEFT HEART CATH AND CORONARY ANGIOGRAPHY N/A 07/29/2018   Procedure: RIGHT/LEFT HEART CATH AND CORONARY ANGIOGRAPHY;  Surgeon: Jolaine Artist, MD;  Location: [facility] LAB;  Service: Cardiovascular;   Laterality: N/A;   TONSILLECTOMY      Current Medications: Outpatient Medications Prior to Visit  Medication Sig Dispense Refill   acetaminophen (TYLENOL) 325 MG tablet Take 2 tablets (650 mg total) by mouth every 6 (six) hours as needed for mild pain (or Fever >/= 101). 30 tablet 0   albuterol (PROAIR HFA) 108 (90 Base) MCG/ACT inhaler Inhale 2 puffs into the lungs every 6 (six) hours as needed for wheezing or shortness of breath.     aspirin EC 81 MG EC tablet Take 1 tablet (81 mg total) by mouth daily. 30 tablet 0   carvedilol (COREG) 6.25 MG tablet Take 1 tablet (6.25 mg total) by mouth 2 (two) times daily with a meal. 180 tablet 3   digoxin (LANOXIN) 0.125 MG tablet TAKE 1 TABLET DAILY 90 tablet 3   ezetimibe (ZETIA) 10 MG tablet Take 10 mg by mouth daily.       fenofibrate 160 MG tablet Take 160 mg by mouth daily.     furosemide (LASIX) 40 MG tablet Take 1 tablet (40 mg total) by mouth daily. 30 tablet 0   linagliptin (TRADJENTA) 5 MG TABS tablet Take 5 mg by mouth daily.      metFORMIN (GLUCOPHAGE-XR) 500 MG 24 hr tablet Take 500 mg by mouth 2 (two) times daily.      Multiple Vitamin (MULTIVITAMIN) tablet Take 1 tablet by mouth daily.     Omega-3 Fatty Acids (FISH OIL) 1200 MG CAPS Take 1 capsule by mouth 3 (three) times daily.      pantoprazole (PROTONIX) 40 MG tablet Take 1 tablet (40 mg total) by mouth daily. 30 tablet 0   pravastatin (PRAVACHOL) 40 MG tablet Take 80 mg by mouth daily.      repaglinide (PRANDIN) 2 MG tablet Take 2 mg by mouth 2 (two) times daily before a meal.      sacubitril-valsartan (ENTRESTO) 24-26 MG Take 1 tablet by mouth 2 (two) times daily. 28 tablet 0   spironolactone (ALDACTONE) 25 MG tablet Take 1 tablet (25 mg total) by mouth daily. 30 tablet 6   warfarin (COUMADIN) 7.5 MG tablet Take 1 tablet (7.5 mg total) by mouth daily.     No facility-administered medications prior to visit.      Allergies:   Penicillins and Statins    Social History   Socioeconomic History   Marital status: Married    Spouse name: Not on file   Number of children: 5   Years of education: 12   Highest education level: Not on file  Occupational History   Occupation: Retired  Scientist, product/process development strain: Not on file   Food insecurity    Worry: Not on file    Inability: Not on Lexicographer needs    Medical: Not on file    Non-medical: Not on file  Tobacco Use   Smoking status: Former Smoker    Types: Cigarettes    Quit date:  02/25/2000    Years since quitting: 18.7   Smokeless tobacco: Former Network engineer and Sexual Activity   Alcohol use: No   Drug use: No   Sexual activity: Not on file  Lifestyle   Physical activity    Days per week: Not on file    Minutes per session: Not on file   Stress: Not on file  Relationships   Social connections    Talks on phone: Not on file    Gets together: Not on file    Attends religious service: Not on file    Active member of club or organization: Not on file    Attends meetings of clubs or organizations: Not on file    Relationship status: Not on file  Other Topics Concern   Not on file  Social History Narrative   Lives at home with his wife.   Right-handed.   3-4 cups caffeine per day.     Family History:  The patient's family history includes Coronary artery disease in his mother and another family member; Deep vein thrombosis in his son; Diabetes in his daughter and mother; Heart attack in his brother, mother, sister, and son; Heart disease in his brother, mother, and sister; Heart disease (age of onset: 44) in his son; Hyperlipidemia in his brother; Hypertension in his brother, daughter, mother, and sister; Other in his brother, daughter, and mother.   Review of Systems:   Please see the history of present illness.     General:  No chills, fever, night sweats or weight changes.  Cardiovascular:  No chest pain, dyspnea on exertion,  edema, orthopnea, palpitations, paroxysmal nocturnal dyspnea. Dermatological: No rash, lesions/masses Respiratory: No cough, dyspnea Urologic: No hematuria, dysuria Abdominal:   No nausea, vomiting, diarrhea, bright red blood per rectum, melena, or hematemesis Neurologic:  No visual changes, wkns, changes in mental status.  He denies any of the above.   All other systems reviewed and are otherwise negative except as noted above.   Physical Exam:    VS:  BP 108/61    Pulse 79    Temp 97.6 F (36.4 C)    Ht 5\' 6"  (1.676 m)    Wt 154 lb (69.9 kg)    BMI 24.86 kg/m    General: Well developed, well nourished,male appearing in no acute distress. Head: Normocephalic, atraumatic, sclera non-icteric, no xanthomas, nares are without discharge.  Neck: No carotid bruits. JVD not elevated.  Lungs: Respirations regular and unlabored, without wheezes or rales.  Heart: Irregularly irregular. No S3 or S4.  No murmur, no rubs, or gallops appreciated. Abdomen: Soft, non-tender, non-distended with normoactive bowel sounds. No hepatomegaly. No rebound/guarding. No obvious abdominal masses. Msk:  Strength and tone appear normal for age. No joint deformities or effusions. Extremities: No clubbing or cyanosis. No lower extremity edema.  Distal pedal pulses are 2+ bilaterally. Neuro: Alert and oriented X 3. Moves all extremities spontaneously. No focal deficits noted. Psych:  Responds to questions appropriately with a normal affect. Skin: No rashes or lesions noted  Wt Readings from Last 3 Encounters:  11/23/18 154 lb (69.9 kg)  10/08/18 154 lb 12.8 oz (70.2 kg)  08/24/18 151 lb (68.5 kg)     Studies/Labs Reviewed:   EKG:  EKG is not ordered today.   Recent Labs: 07/21/2018: B Natriuretic Peptide 2,433.0 08/08/2018: Magnesium 2.0 08/11/2018: ALT 32 08/12/2018: BUN 36; Creatinine, Ser 1.28; Hemoglobin 11.2; Platelets 552; Potassium 4.7; Sodium 132   Lipid Panel  Component Value Date/Time   CHOL  95 07/25/2018 0443   TRIG 113 07/25/2018 0443   HDL 17 (L) 07/25/2018 0443   CHOLHDL 5.6 07/25/2018 0443   VLDL 23 07/25/2018 0443   LDLCALC 55 07/25/2018 0443    Additional studies/ records that were reviewed today include:   Echocardiogram: 06/2018 IMPRESSIONS    1. The left ventricle has severely reduced systolic function, with an ejection fraction of 25-30%. The cavity size was mildly dilated. Left ventricular diastolic Doppler parameters are indeterminate.  2. LVEF is severly depressed Only base of ventricle moves well.  3. The right ventricle has moderately reduced systolic function. The cavity was mildly enlarged. There is not assessed.  4. Left atrial size was severely dilated.  5. Right atrial size was severely dilated.  6. Mildly thickened tricuspid valve leaflets.  7. The mitral valve is abnormal. Mild thickening of the mitral valve leaflet. There is mild mitral annular calcification present.  8. The tricuspid valve is abnormal.  9. The aortic valve is tricuspid. Mild thickening of the aortic valve. Mild calcification of the aortic valve. Aortic valve regurgitation is mild by color flow Doppler. 10. The inferior vena cava was dilated in size with <50% respiratory variability. 11. The interatrial septum was not assessed.   Cardiac Catheterization: 07/29/2018  Prox RCA lesion is 100% stenosed.  Prox LAD lesion is 99% stenosed.  Ost LM lesion is 75% stenosed.  Prox Cx to Mid Cx lesion is 70% stenosed.  LPDA lesion is 40% stenosed.  Ost Cx to Prox Cx lesion is 30% stenosed.   Findings:  Ao = 148/81 (109)  LV = 13215 RA = 7 RV = 59/6 PA = 59/24 (37) PCW = 22 Fick cardiac output/index = 4.1/2.3 PVR = 3.3 WU Ao sat = 93% PA sat = 62%, 64%  Assessment: 1. Severe 3v CAD in left dominant system with 70-80% LM lesion and 99% proximal LAD lesion 2. Ischemic CM EF 25% 3. Mild to moderate pulmonary HTN 4. Moderately reduced CO  Plan/Discussion:  Will  d/w with TCTS regarding possible CABG and Maze. If not surgical candidate will need PCI of LAD with atherectomy +/- LM PCI.   I discussed with his wife by phone as well.   Assessment:    1. Chronic combined systolic and diastolic heart failure ([place])   2. Coronary artery disease involving native coronary artery of native heart without angina pectoris   3. Medication management   4. Essential hypertension   5. Hyperlipidemia LDL goal <70   6. Permanent atrial fibrillation (HCC)   7. Carotid artery stenosis, symptomatic, right      Plan:   In order of problems listed above:  1. Chronic Combined Systolic and Diastolic CHF - he has a known reduced EF of 25 to 30% by echocardiogram in 07/2018. He denies any recent dyspnea on exertion, orthopnea, PND or lower extremity edema. Reports his breathing is the best that it has been in several years. Weight has also been stable on his home scales and he is following a low-sodium diet. - Will continue with his current regimen of Coreg 6.25 mg twice daily, Digoxin 0.125 mg daily, Lasix 40 mg daily, Entresto 24-26mg  BID and Spironolactone 25mg  daily. BP was soft at 108/61 when initially checked, at 102/68 on recheck which does not allow for titration of his regimen at this time. I will see if Social Work has any BP cuffs remaining so he can follow BP at home. Will recheck BMET  and Digoxin level as he has not had repeat lab work in several months.   2. CAD - s/p cath in 07/2018 which showed 100% Proximal-RCA stenosis, 99% Proximal-LAD, 75% Ost LM, 70% LCx, 40% PDA, and 30% Ost LCx with patient declining CABG along with declining arthrectomy with Impella support. He continues to decline CABG but says he would proceed with stent placement if indicated. By review of Dr. Rosezella [place] last office note he was going to review with the Interventional team.  Will reach out to him today to see if a decision has been made. Given that he has experienced improvement in  his symptoms, would see if he recommends obtaining a limited echo to reassess LV function.   - Continue ASA, beta-blocker, and statin therapy.    3. HTN - BP is soft during today's visit as outlined above. Continue current medication regimen at this time.    4. HLD - FLP in 06/2018 showed total cholesterol 95, triglycerides 113, HDL 17 and LD 55.  He has been intolerant to multiple statins in the past but is tolerating Pravastatin 80 mg daily along with Zetia, Fenofibrate and Fish Oil.  5. Permanent Atrial Fibrillation - He denies any recent palpitations and heart rate is well controlled in the 70's during today's visit.  Continue Coreg 6.25 mg twice daily for rate control. He denies any evidence of active bleeding. Continue Coumadin for anticoagulation.  6. Carotid Artery Stenosis - s/p L CEA in 2007 and R CEA in 07/2018. Followed by Vascular Surgery.   Medication Adjustments/Labs and Tests Ordered: Current medicines are reviewed at length with the patient today.  Concerns regarding medicines are outlined above.  Medication changes, Labs and Tests ordered today are listed in the Patient Instructions below. Patient Instructions  Medication Instructions:  Your physician recommends that you continue on your current medications as directed. Please refer to the Current Medication list given to you today.  If you need a refill on your cardiac medications before your next appointment, please call your pharmacy.   Lab work: Your physician recommends that you return for lab work at PCP   If you have labs (blood work) drawn today and your tests are completely normal, you will receive your results only by:  [place] (if you have MyChart) OR  A paper copy in the mail If you have any lab test that is abnormal or we need to change your treatment, we will call you to review the results.  Testing/Procedures: NONE   Follow-Up: At [facility], you and your health needs are our priority.   As part of our continuing mission to provide you with exceptional heart care, we have created designated Provider Care Teams.  These Care Teams include your primary Cardiologist (physician) and Advanced Practice Providers (APPs -  Physician Assistants and Nurse Practitioners) who all work together to provide you with the care you need, when you need it. You will need a follow up appointment in 1 months.  Please call our office 2 months in advance to schedule this appointment.  You may see Minus Breeding, MD or one of the following Advanced Practice Providers on your designated Care Team:   Bernerd Pho, PA-C ([place])  Ermalinda Barrios, PA-C [facility])  Any Other Special Instructions Will Be Listed Below (If Applicable). Thank you for choosing [facility]!     Signed, Erma Heritage, PA-C  11/23/2018 5:53 PM    [address]  [place], [place] [address] Phone: [phone] Fax: [phone]

## 2018-11-24 NOTE — Progress Notes (Signed)
Oscar Mocha, MD  Minus Breeding, MD        I looked over his films and the cardiac MR interpretation. The LAD is treatable, maybe a short CTO. I don't think he would need impella. The ventriculogram looks bad to me, anterolateral wall is dyskinetic. I think PCI of the LAD is very unlikely to help him. The left main doesn't look tight to me. There is a good picture of it with a catheter into the left, brisk dye reflux, and an open left main back into the aorta. The LAD lesion is beyond the diagonals, so the mid-anterolateral wall area that has some viability probably wouldn't be affected by PCI. I'm happy to look over the study with you, but I would lean toward medical therapy.    I had this communication.  Please call the patient and let him know that we will continue medical management.  Please call him and repeat the echo as planned.

## 2018-11-25 ENCOUNTER — Telehealth: Payer: Self-pay | Admitting: Licensed Clinical Social Worker

## 2018-11-25 ENCOUNTER — Telehealth: Payer: Self-pay | Admitting: Student

## 2018-11-25 NOTE — Telephone Encounter (Addendum)
     Reviewed plans for possible PCI with Dr. Percival Spanish. He had discussed this with Dr. Burt Knack and it was felt that he would not benefit significantly from PCI of the LAD and medical therapy was recommended. Dr. Percival Spanish was in agreement to have a repeat limited echocardiogram to assess LV function and wall motion.  I attempted to contact the patient to review this with him but there was no answer and no option to leave a voicemail.  Will try again later.   Arna Medici, PA-C 11/25/2018, 12:34 PM Pager: [phone]   10/2: Attempted to contact the patient again but no success. Did LVM on wife's cell and daughter's cell to return call to [facility].   Signed, Erma Heritage, PA-C 11/26/2018, 11:52 AM Pager: [phone]

## 2018-11-25 NOTE — Telephone Encounter (Signed)
CSW received referral to assist patient with medication assistance. CSW attempted to contact patient although no answer and unable to leave a message. CSW will attempt contact again. Oscar [name], [place], [place]'

## 2018-11-26 ENCOUNTER — Telehealth: Payer: Self-pay | Admitting: Licensed Clinical Social Worker

## 2018-11-26 NOTE — Telephone Encounter (Signed)
CSW referred to assist patient with obtaining a BP cuff. CSW contacted patient to inform cuff will be delivered to home although unable to leave a message. CSW available as needed. [name], [place], [place]

## 2018-11-29 ENCOUNTER — Other Ambulatory Visit: Payer: Self-pay

## 2018-11-29 ENCOUNTER — Other Ambulatory Visit (HOSPITAL_COMMUNITY)
Admission: RE | Admit: 2018-11-29 | Discharge: 2018-11-29 | Disposition: A | Discharge status: Home / Self Care | Payer: Medicare Other | Source: Ambulatory Visit | Attending: Student | Admitting: Student

## 2018-11-29 DIAGNOSIS — Z79899 Other long term (current) drug therapy: Secondary | ICD-10-CM | POA: Diagnosis present

## 2018-11-29 LAB — BASIC METABOLIC PANEL
Anion gap: 10 (ref 5–15)
BUN: 36 mg/dL — ABNORMAL HIGH (ref 8–23)
CO2: 23 mmol/L (ref 22–32)
Calcium: 9.4 mg/dL (ref 8.9–10.3)
Chloride: 103 mmol/L (ref 98–111)
Creatinine, Ser: 1.2 mg/dL (ref 0.61–1.24)
GFR calc Af Amer: >60 mL/min (ref >60)
GFR calc non Af Amer: 57 mL/min — ABNORMAL LOW (ref >60)
Glucose, Bld: 239 mg/dL — ABNORMAL HIGH (ref 70–99)
Potassium: 5 mmol/L (ref 3.5–5.1)
Sodium: 136 mmol/L (ref 135–145)

## 2018-11-29 LAB — DIGOXIN LEVEL: Digoxin Level: 1.3 ng/mL (ref 0.8–2.0)

## 2018-12-02 ENCOUNTER — Telehealth: Payer: Self-pay | Admitting: *Deleted

## 2018-12-02 ENCOUNTER — Encounter: Payer: Self-pay | Admitting: *Deleted

## 2018-12-02 NOTE — Telephone Encounter (Signed)
Called patient with test results. No answer. Unable to leave msg.  

## 2018-12-02 NOTE — Telephone Encounter (Signed)
-----   Message from Erma Heritage, [place] sent at 11/30/2018  9:26 AM EDT ----- Please let the patient know his kidney function and electrolytes remain stable. Digoxin level slightly elevated at 1.3 and we prefer to keep this ~ 1.0. Would cut his Digoxin in half and take 0.0625 mg daily. Once able to get in touch with the patient, please refer to phone note from 11/25/2018 as he needs a limited echo and I was unable to reach him despite multiple attempts.

## 2018-12-06 ENCOUNTER — Other Ambulatory Visit: Payer: Self-pay | Admitting: *Deleted

## 2018-12-06 DIAGNOSIS — I080 Rheumatic disorders of both mitral and aortic valves: Secondary | ICD-10-CM

## 2018-12-06 NOTE — Progress Notes (Signed)
Order placed

## 2018-12-07 ENCOUNTER — Other Ambulatory Visit: Payer: Self-pay

## 2018-12-07 ENCOUNTER — Ambulatory Visit (HOSPITAL_COMMUNITY)
Admission: RE | Admit: 2018-12-07 | Discharge: 2018-12-07 | Disposition: A | Discharge status: Home / Self Care | Payer: Medicare Other | Source: Ambulatory Visit | Attending: Student | Admitting: Student

## 2018-12-07 ENCOUNTER — Other Ambulatory Visit: Payer: Self-pay | Admitting: Student

## 2018-12-07 DIAGNOSIS — I080 Rheumatic disorders of both mitral and aortic valves: Secondary | ICD-10-CM | POA: Insufficient documentation

## 2018-12-07 NOTE — Progress Notes (Signed)
  Echocardiogram 2D Echocardiogram has been performed.  Oscar [name] 12/07/2018, 1:21 PM

## 2018-12-28 DIAGNOSIS — I2510 Atherosclerotic heart disease of native coronary artery without angina pectoris: Secondary | ICD-10-CM | POA: Insufficient documentation

## 2018-12-28 DIAGNOSIS — I5022 Chronic systolic (congestive) heart failure: Secondary | ICD-10-CM | POA: Insufficient documentation

## 2018-12-28 NOTE — Progress Notes (Signed)
Cardiology Office Note   Date:  12/29/2018   ID:  Oscar [name], DOB [birth year], MRN [id number]  PCP:  Oscar Sill, NP  Cardiologist:   Oscar Breeding, MD   Chief Complaint  Patient presents with  . Coronary Artery Disease      History of Present Illness: Oscar [name] is a 78 y.o. male who presents for follow up of cardiomyopathy.  He was in the ED in May and an EF of 30% which was lower than previous.  A cath was performed by Dr. Haroldine [name] on 07/29/2018 and showed 100% Proximal-RCA stenosis, 99% Proximal-LAD, 75% Ost LM, 70% LCx, 40% PDA, and 30% Ost LCx. Cardiac MRI was performed to assess viability and showed a mixed picture with the mid anteroseptal/inferoseptal and the apical inferior and true apex wall segments unlikely to improve with revascularization however the other affected wall segments appeared viable. CT surgery was consulted but the patient declined CABG. Options regarding high risk PCI with atherectomy and Impella support were reviewed but the patient declined this as well and wished to pursue medical management. Also refused CEA during admission .  He presented back to the hospital in June with AMS.  He eventually did have CEA.    After his last visit with me I did review his films with Dr. Burt [name] and we agreed that PCI would be high risk and difficult for procedural success.  He did have an echocardiogram which demonstrated that his EF was up a little bit from 25 to 30% and perhaps mildly increased to 35 to 40%.  He did have some moderately elevated pulmonary pressures.  However, he feels well.  The patient denies any new symptoms such as chest discomfort, neck or arm discomfort. There has been no new shortness of breath, PND or orthopnea. There have been no reported palpitations, presyncope or syncope.    Past Medical History:  Diagnosis Date  . Anemia   . Atrial fibrillation ([place])    permanent  . CAD (coronary artery disease)    a. cath on 07/29/2018  showed 100% Proximal-RCA stenosis, 99% Proximal-LAD, 75% Ost LM, 70% LCx, 40% PDA, and 30% Ost LCx. Declined CABG  . Cardiomyopathy    EF 35% in the past--Improved to 55% echo 2/10. Moderately severe mitral regurgitation in the past, now improved,  . Carotid artery stenosis   . CHF (congestive heart failure) ([place]) 2002  . COPD (chronic obstructive pulmonary disease) ([place])   . Coronary artery disease    non obstructive  . Diabetes mellitus   . Dyslipidemia   . History of tobacco abuse   . Nephrolithiasis   . Tremors of nervous system     Past Surgical History:  Procedure Laterality Date  . CAROTID ENDARTERECTOMY Left 10-21-05   cea  . CATARACT EXTRACTION W/PHACO Left 09/10/2018   Procedure: CATARACT EXTRACTION PHACO AND INTRAOCULAR LENS PLACEMENT LEFT EYE;  Surgeon: Baruch Goldmann, MD;  Location: [facility];  Service: Ophthalmology;  Laterality: Left;  left  . CATARACT EXTRACTION W/PHACO Right 11/12/2018   Procedure: CATARACT EXTRACTION PHACO AND INTRAOCULAR LENS PLACEMENT RIGHT EYE;  Surgeon: Baruch Goldmann, MD;  Location: [facility];  Service: Ophthalmology;  Laterality: Right;  right  . ENDARTERECTOMY Right 08/11/2018   Procedure: ENDARTERECTOMY CAROTID RIGHT;  Surgeon: [name], MD;  Location: [facility];  Service: Vascular;  Laterality: Right;  . left carotid endarterectomy    . PATCH ANGIOPLASTY Right 08/11/2018   Procedure: PATCH ANGIOPLAST USING HEMASHIELD PLATINUM FINESSE;  Surgeon: [name], MD;  Location: [facility];  Service: Vascular;  Laterality: Right;  . RIGHT/LEFT HEART CATH AND CORONARY ANGIOGRAPHY N/A 07/29/2018   Procedure: RIGHT/LEFT HEART CATH AND CORONARY ANGIOGRAPHY;  Surgeon: Jolaine Artist, MD;  Location: [facility];  Service: Cardiovascular;  Laterality: N/A;  . TONSILLECTOMY       Current Outpatient Medications  Medication Sig Dispense Refill  . acetaminophen (TYLENOL) 325 MG tablet Take 2 tablets (650 mg total) by mouth every 6 (six) hours as needed for  mild pain (or Fever >/= 101). 30 tablet 0  . albuterol (PROAIR HFA) 108 (90 Base) MCG/ACT inhaler Inhale 2 puffs into the lungs every 6 (six) hours as needed for wheezing or shortness of breath.    Marland Kitchen aspirin EC 81 MG EC tablet Take 1 tablet (81 mg total) by mouth daily. 30 tablet 0  . carvedilol (COREG) 6.25 MG tablet Take 1.5 tablets (9.375 mg total) by mouth 2 (two) times daily with a meal. 270 tablet 3  . digoxin (LANOXIN) 0.125 MG tablet TAKE 1 TABLET DAILY (Patient taking differently: Take 1/2 tablet by mouth once daily) 90 tablet 3  . ezetimibe (ZETIA) 10 MG tablet Take 10 mg by mouth daily.      . fenofibrate 160 MG tablet Take 160 mg by mouth daily.    . furosemide (LASIX) 40 MG tablet Take 1 tablet (40 mg total) by mouth daily. 30 tablet 0  . linagliptin (TRADJENTA) 5 MG TABS tablet Take 5 mg by mouth daily.     . metFORMIN (GLUCOPHAGE-XR) 500 MG 24 hr tablet Take 500 mg by mouth 2 (two) times daily.     . Multiple Vitamin (MULTIVITAMIN) tablet Take 1 tablet by mouth daily.    . Omega-3 Fatty Acids (FISH OIL) 1200 MG CAPS Take 1 capsule by mouth 3 (three) times daily.     . pantoprazole (PROTONIX) 40 MG tablet Take 1 tablet (40 mg total) by mouth daily. 30 tablet 0  . pravastatin (PRAVACHOL) 40 MG tablet Take 80 mg by mouth daily.     . repaglinide (PRANDIN) 2 MG tablet Take 2 mg by mouth 2 (two) times daily before a meal.     . sacubitril-valsartan (ENTRESTO) 24-26 MG Take 1 tablet by mouth 2 (two) times daily. 28 tablet 0  . spironolactone (ALDACTONE) 25 MG tablet Take 1 tablet (25 mg total) by mouth daily. 30 tablet 6  . warfarin (COUMADIN) 7.5 MG tablet Take 1 tablet (7.5 mg total) by mouth daily.     No current facility-administered medications for this visit.     Allergies:   Penicillins and Statins    ROS:  Please see the history of present illness.   Otherwise, review of systems are positive for non3.   All other systems are reviewed and negative.    PHYSICAL EXAM: VS:   BP 130/80   Pulse (!) 108   Ht 5\' 6"  (1.676 m)   Wt 153 lb (69.4 kg)   BMI 24.69 kg/m  , BMI Body mass index is 24.69 kg/m. GENERAL:  Well appearing NECK:  No jugular venous distention, waveform within normal limits, carotid upstroke brisk and symmetric, no bruits, no thyromegaly LUNGS:  Clear to auscultation bilaterally CHEST:  Unremarkable HEART:  PMI not displaced or sustained,S1 and S2 within normal limits, no S3, no clicks, no rubs, no murmurs, irregular ABD:  Flat, positive bowel sounds normal in frequency in pitch, no bruits, no rebound, no guarding, no  midline pulsatile mass, no hepatomegaly, no splenomegaly EXT:  2 plus pulses throughout, no edema, no cyanosis no clubbing   EKG:  EKG ordered today. Atrial fibrillation, rate 108, axis within normal limits, intervals within normal limit.  Premature ventricular contractions with couplets.  Recent Labs: 07/21/2018: B Natriuretic Peptide 2,433.0 08/08/2018: Magnesium 2.0 08/11/2018: ALT 32 08/12/2018: Hemoglobin 11.2; Platelets 552 11/29/2018: BUN 36; Creatinine, Ser 1.20; Potassium 5.0; Sodium 136    Lipid Panel    Component Value Date/Time   CHOL 95 07/25/2018 0443   TRIG 113 07/25/2018 0443   HDL 17 (L) 07/25/2018 0443   CHOLHDL 5.6 07/25/2018 0443   VLDL 23 07/25/2018 0443   LDLCALC 55 07/25/2018 0443      Wt Readings from Last 3 Encounters:  12/29/18 153 lb (69.4 kg)  11/23/18 154 lb (69.9 kg)  10/08/18 154 lb 12.8 oz (70.2 kg)      Other studies Reviewed: Additional studies/ records that were reviewed today include:   Echo and labs Review of the above records demonstrates:    ASSESSMENT AND PLAN:  CAD:    He has severe CAD and I reviewed his films today.  He really does not have bypass and as above PCI would be high risk.  He has had no new symptoms.  No change in therapy.   CHRONIC COMBINED SYSTOLIC AND DIASTOLIC HF:     His ejection fraction seems to be slightly better.  His heart rates a little bit  higher.  They did reduce his digoxin because his level was above our target.  I did increase his carvedilol to 9.375 mg twice daily and he can be followed to have med titration perhaps monthly as his blood pressure allows.   ATRIAL FIB: He tolerates anticoagulation.  No change in therapy.   HTN:   This is being managed in the context of treating his CHF  HYPERLIPIDEMIA:    LDL was 55.  No change in therapy.     Current medicines are reviewed at length with the patient today.  The patient does not have concerns regarding medicines.  The following changes have been made:  None  Labs/ tests ordered today include: None  Orders Placed This Encounter  Procedures  . EKG 12-Lead     Disposition:   FU with Tanzania Strader PAc in one month.   Signed, Oscar Breeding, MD  12/29/2018 1:59 PM    [facility]

## 2018-12-29 ENCOUNTER — Encounter: Payer: Self-pay | Admitting: Cardiology

## 2018-12-29 ENCOUNTER — Ambulatory Visit (INDEPENDENT_AMBULATORY_CARE_PROVIDER_SITE_OTHER): Payer: Medicare Other | Admitting: Cardiology

## 2018-12-29 ENCOUNTER — Other Ambulatory Visit: Payer: Self-pay

## 2018-12-29 VITALS — BP 130/80 | HR 108 | Ht 66.0 in | Wt 153.0 lb

## 2018-12-29 DIAGNOSIS — I5022 Chronic systolic (congestive) heart failure: Secondary | ICD-10-CM

## 2018-12-29 DIAGNOSIS — I10 Essential (primary) hypertension: Secondary | ICD-10-CM | POA: Diagnosis not present

## 2018-12-29 DIAGNOSIS — I2510 Atherosclerotic heart disease of native coronary artery without angina pectoris: Secondary | ICD-10-CM | POA: Diagnosis not present

## 2018-12-29 DIAGNOSIS — E785 Hyperlipidemia, unspecified: Secondary | ICD-10-CM

## 2018-12-29 DIAGNOSIS — I4821 Permanent atrial fibrillation: Secondary | ICD-10-CM | POA: Diagnosis not present

## 2018-12-29 MED ORDER — CARVEDILOL 6.25 MG PO TABS: 9.3750 mg | ORAL_TABLET | Freq: Two times a day (BID) | ORAL | 3 refills | Status: DC

## 2018-12-29 NOTE — Patient Instructions (Signed)
Medication Instructions:  Please increase your Carvedilol to 9.375 mg twice a day. (1 and 1/2 tablets twice a day)  Continue all other medications as listed.  *If you need a refill on your cardiac medications before your next appointment, please call your pharmacy*  Follow-Up: Follow up in 1 month with [facility] in [place].  Thank you for choosing [facility]!!

## 2019-01-06 ENCOUNTER — Encounter (HOSPITAL_COMMUNITY): Payer: Self-pay

## 2019-01-06 ENCOUNTER — Emergency Department (HOSPITAL_COMMUNITY): Payer: Medicare Other

## 2019-01-06 ENCOUNTER — Other Ambulatory Visit: Payer: Self-pay

## 2019-01-06 ENCOUNTER — Emergency Department (HOSPITAL_COMMUNITY)
Admission: EM | Admit: 2019-01-06 | Discharge: 2019-01-06 | Disposition: A | Discharge status: Home / Self Care | Payer: Medicare Other | Attending: Emergency Medicine | Admitting: Emergency Medicine

## 2019-01-06 DIAGNOSIS — E875 Hyperkalemia: Secondary | ICD-10-CM | POA: Insufficient documentation

## 2019-01-06 DIAGNOSIS — I5022 Chronic systolic (congestive) heart failure: Secondary | ICD-10-CM | POA: Insufficient documentation

## 2019-01-06 DIAGNOSIS — J449 Chronic obstructive pulmonary disease, unspecified: Secondary | ICD-10-CM | POA: Diagnosis not present

## 2019-01-06 DIAGNOSIS — Z8673 Personal history of transient ischemic attack (TIA), and cerebral infarction without residual deficits: Secondary | ICD-10-CM | POA: Insufficient documentation

## 2019-01-06 DIAGNOSIS — Z7984 Long term (current) use of oral hypoglycemic drugs: Secondary | ICD-10-CM | POA: Diagnosis not present

## 2019-01-06 DIAGNOSIS — I2510 Atherosclerotic heart disease of native coronary artery without angina pectoris: Secondary | ICD-10-CM | POA: Diagnosis not present

## 2019-01-06 DIAGNOSIS — Z7982 Long term (current) use of aspirin: Secondary | ICD-10-CM | POA: Diagnosis not present

## 2019-01-06 DIAGNOSIS — Z87891 Personal history of nicotine dependence: Secondary | ICD-10-CM | POA: Diagnosis not present

## 2019-01-06 DIAGNOSIS — E1122 Type 2 diabetes mellitus with diabetic chronic kidney disease: Secondary | ICD-10-CM | POA: Insufficient documentation

## 2019-01-06 DIAGNOSIS — N1832 Chronic kidney disease, stage 3b: Secondary | ICD-10-CM | POA: Insufficient documentation

## 2019-01-06 DIAGNOSIS — R7989 Other specified abnormal findings of blood chemistry: Secondary | ICD-10-CM | POA: Diagnosis present

## 2019-01-06 DIAGNOSIS — Z7901 Long term (current) use of anticoagulants: Secondary | ICD-10-CM | POA: Insufficient documentation

## 2019-01-06 DIAGNOSIS — Z79899 Other long term (current) drug therapy: Secondary | ICD-10-CM | POA: Diagnosis not present

## 2019-01-06 DIAGNOSIS — I4891 Unspecified atrial fibrillation: Secondary | ICD-10-CM | POA: Diagnosis not present

## 2019-01-06 LAB — CBC WITH DIFFERENTIAL/PLATELET
Abs Immature Granulocytes: 0.22 10*3/uL — ABNORMAL HIGH (ref 0.00–0.07)
Basophils Absolute: 0.1 10*3/uL (ref 0.0–0.1)
Basophils Relative: 1 %
Eosinophils Absolute: 0.2 10*3/uL (ref 0.0–0.5)
Eosinophils Relative: 2 %
HCT: 39.6 % (ref 39.0–52.0)
Hemoglobin: 12.6 g/dL — ABNORMAL LOW (ref 13.0–17.0)
Immature Granulocytes: 2 %
Lymphocytes Relative: 19 %
Lymphs Abs: 2.5 10*3/uL (ref 0.7–4.0)
MCH: 30.2 pg (ref 26.0–34.0)
MCHC: 31.8 g/dL (ref 30.0–36.0)
MCV: 95 fL (ref 80.0–100.0)
Monocytes Absolute: 0.9 10*3/uL (ref 0.1–1.0)
Monocytes Relative: 7 %
Neutro Abs: 9.1 10*3/uL — ABNORMAL HIGH (ref 1.7–7.7)
Neutrophils Relative %: 69 %
Platelets: 441 10*3/uL — ABNORMAL HIGH (ref 150–400)
RBC: 4.17 MIL/uL — ABNORMAL LOW (ref 4.22–5.81)
RDW: 14.6 % (ref 11.5–15.5)
WBC: 13 10*3/uL — ABNORMAL HIGH (ref 4.0–10.5)
nRBC: 0 % (ref 0.0–0.2)

## 2019-01-06 LAB — CBG MONITORING, ED: Glucose-Capillary: 182 mg/dL — ABNORMAL HIGH (ref 70–99)

## 2019-01-06 LAB — BASIC METABOLIC PANEL
Anion gap: 11 (ref 5–15)
BUN: 83 mg/dL — ABNORMAL HIGH (ref 8–23)
CO2: 20 mmol/L — ABNORMAL LOW (ref 22–32)
Calcium: 9.6 mg/dL (ref 8.9–10.3)
Chloride: 99 mmol/L (ref 98–111)
Creatinine, Ser: 1.75 mg/dL — ABNORMAL HIGH (ref 0.61–1.24)
GFR calc Af Amer: 42 mL/min — ABNORMAL LOW (ref >60)
GFR calc non Af Amer: 36 mL/min — ABNORMAL LOW (ref >60)
Glucose, Bld: 153 mg/dL — ABNORMAL HIGH (ref 70–99)
Potassium: 6.5 mmol/L (ref 3.5–5.1)
Sodium: 130 mmol/L — ABNORMAL LOW (ref 135–145)

## 2019-01-06 LAB — PROTIME-INR
INR: 2.1 — ABNORMAL HIGH (ref 0.8–1.2)
Prothrombin Time: 23 seconds — ABNORMAL HIGH (ref 11.4–15.2)

## 2019-01-06 LAB — DIGOXIN LEVEL: Digoxin Level: 0.5 ng/mL — ABNORMAL LOW (ref 0.8–2.0)

## 2019-01-06 MED ORDER — SODIUM BICARBONATE 8.4 % IV SOLN
INTRAVENOUS | Status: AC
  Filled 2019-01-06: qty 50

## 2019-01-06 MED ORDER — FUROSEMIDE 40 MG PO TABS: 20.0000 mg | ORAL_TABLET | Freq: Every day | ORAL | 0 refills | Status: DC

## 2019-01-06 MED ORDER — DEXTROSE 50 % IV SOLN
1.0000 | Freq: Once | INTRAVENOUS | Status: AC
  Administered 2019-01-06: 50 mL via INTRAVENOUS
  Filled 2019-01-06: qty 50

## 2019-01-06 MED ORDER — SODIUM BICARBONATE 8.4 % IV SOLN
50.0000 meq | Freq: Once | INTRAVENOUS | Status: AC
  Administered 2019-01-06: 50 meq via INTRAVENOUS
  Filled 2019-01-06: qty 50

## 2019-01-06 MED ORDER — HYDROMORPHONE HCL 1 MG/ML IJ SOLN: 1.0000 mg | Freq: Once | INTRAMUSCULAR | Status: DC

## 2019-01-06 MED ORDER — SODIUM BICARBONATE 8.4 % IV SOLN: 50.0000 meq | Freq: Once | INTRAVENOUS | Status: DC

## 2019-01-06 MED ORDER — INSULIN ASPART 100 UNIT/ML IV SOLN
5.0000 [IU] | Freq: Once | INTRAVENOUS | Status: AC
  Administered 2019-01-06: 5 [IU] via INTRAVENOUS

## 2019-01-06 NOTE — ED Triage Notes (Signed)
Pt reports pcp at [facility] in [facility].  Reports had blood work drawn last Friday and last Tuesday.  Reports was called and told to come to ed for IV fluid  And to have a "kidney scan."

## 2019-01-06 NOTE — Discharge Instructions (Addendum)
Decrease lasix to 20 mg daily for 1 week.  Stop aldactone.

## 2019-01-06 NOTE — ED Provider Notes (Signed)
[facility] EMERGENCY DEPARTMENT Provider Note   CSN: [id number] Arrival date & time: 01/06/19  [id number]     History   Chief Complaint Chief Complaint  Patient presents with  . Abnormal Lab    HPI Oscar [name] is a 78 y.o. male.     Pt presents to the ED today with abnormal labs.  He said he was told to come by his pcp.  Pt said he feels ok.    I spoke with his provider and he said his Cr is trending up and his GFR is trending down and K was slightly high.  Pt had his metformin decreased by his provider which did not help his kidney function.     Past Medical History:  Diagnosis Date  . Anemia   . Atrial fibrillation ([place])    permanent  . CAD (coronary artery disease)    a. cath on 07/29/2018 showed 100% Proximal-RCA stenosis, 99% Proximal-LAD, 75% Ost LM, 70% LCx, 40% PDA, and 30% Ost LCx. Declined CABG  . Cardiomyopathy    EF 35% in the past--Improved to 55% echo 2/10. Moderately severe mitral regurgitation in the past, now improved,  . Carotid artery stenosis   . CHF (congestive heart failure) ([place]) 2002  . COPD (chronic obstructive pulmonary disease) ([place])   . Coronary artery disease    non obstructive  . Diabetes mellitus   . Dyslipidemia   . History of tobacco abuse   . Nephrolithiasis   . Tremors of nervous system     Patient Active Problem List   Diagnosis Date Noted  . Coronary artery disease involving native coronary artery of native heart without angina pectoris 12/28/2018  . Chronic systolic HF (heart failure) ([place]) 12/28/2018  . Carotid artery stenosis, symptomatic, right 08/07/2018  . Dyslipidemia 08/07/2018  . Normocytic normochromic anemia 08/07/2018  . COPD without exacerbation ([place]) 08/07/2018  . AKI (acute kidney injury) ([place]) 08/07/2018  . Type 2 diabetes mellitus with hypoglycemia without coma ([place]) 08/07/2018  . TIA (transient ischemic attack) 07/24/2018  . History of left-sided carotid endarterectomy 02/27/2015  . Carotid artery  stenosis 02/27/2015  . Encounter for postoperative carotid endarterectomy surveillance 02/27/2015  . Aftercare following surgery of the circulatory system, [place] 08/10/2012  . Skin lesion 07/02/2011  . MITRAL REGURGITATION 05/03/2009  . CARDIOMYOPATHY 05/03/2009  . CAROTID ARTERY DISEASE 05/02/2009  . ATRIAL FIBRILLATION 08/29/2008    Past Surgical History:  Procedure Laterality Date  . CAROTID ENDARTERECTOMY Left 10-21-05   cea  . CATARACT EXTRACTION W/PHACO Left 09/10/2018   Procedure: CATARACT EXTRACTION PHACO AND INTRAOCULAR LENS PLACEMENT LEFT EYE;  Surgeon: Baruch Goldmann, MD;  Location: [facility];  Service: Ophthalmology;  Laterality: Left;  left  . CATARACT EXTRACTION W/PHACO Right 11/12/2018   Procedure: CATARACT EXTRACTION PHACO AND INTRAOCULAR LENS PLACEMENT RIGHT EYE;  Surgeon: Baruch Goldmann, MD;  Location: [facility];  Service: Ophthalmology;  Laterality: Right;  right  . ENDARTERECTOMY Right 08/11/2018   Procedure: ENDARTERECTOMY CAROTID RIGHT;  Surgeon: [name], MD;  Location[facility];  Service: Vascular;  Laterality: Right;  . left carotid endarterectomy    . PATCH ANGIOPLASTY Right 08/11/2018   Procedure: PATCH ANGIOPLAST USING HEMASHIELD PLATINUM FINESSE;  Surgeon: [name], MD;  Location: [facility];  Service: Vascular;  Laterality: Right;  . RIGHT/LEFT HEART CATH AND CORONARY ANGIOGRAPHY N/A 07/29/2018   Procedure: RIGHT/LEFT HEART CATH AND CORONARY ANGIOGRAPHY;  Surgeon: Jolaine Artist, MD;  Location: [facility];  Service: Cardiovascular;  Laterality: N/A;  . TONSILLECTOMY          Home Medications    Prior to Admission medications   Medication Sig Start Date End Date Taking? Authorizing Provider  aspirin EC 81 MG EC tablet Take 1 tablet (81 mg total) by mouth daily. 08/13/18  Yes Swayze, Ava, DO  carvedilol (COREG) 6.25 MG tablet Take 1.5 tablets (9.375 mg total) by mouth 2 (two) times daily with a meal. 12/29/18  Yes Hochrein, Jeneen Rinks, MD  digoxin (LANOXIN)  0.125 MG tablet TAKE 1 TABLET DAILY Patient taking differently: Take 0.0625 mg by mouth daily. Take 1/2 tablet by mouth once daily 09/13/18  Yes Hochrein, Jeneen Rinks, MD  ezetimibe (ZETIA) 10 MG tablet Take 10 mg by mouth daily.     Yes [provider]  fenofibrate 160 MG tablet Take 160 mg by mouth daily.   Yes [provider]  ipratropium (ATROVENT) 0.02 % nebulizer solution Take 2.5 mLs (0.5 mg dose) by nebulization 2 (two) times daily. 03/30/18  Yes [provider]  linagliptin (TRADJENTA) 5 MG TABS tablet Take 5 mg by mouth daily.  05/15/14  Yes [provider]  metFORMIN (GLUCOPHAGE-XR) 500 MG 24 hr tablet Take 500 mg by mouth 2 (two) times daily.    Yes [provider]  Multiple Vitamin (MULTIVITAMIN) tablet Take 1 tablet by mouth daily.   Yes [provider]  Omega-3 Fatty Acids (FISH OIL) 1200 MG CAPS Take 1,000 mg by mouth daily.    Yes [provider]  pantoprazole (PROTONIX) 40 MG tablet Take 1 tablet (40 mg total) by mouth daily. 08/13/18  Yes Swayze, Ava, DO  pravastatin (PRAVACHOL) 40 MG tablet Take 80 mg by mouth daily.  07/12/18  Yes [provider]  repaglinide (PRANDIN) 2 MG tablet Take 2 mg by mouth 2 (two) times daily before a meal.  12/19/17  Yes [provider]  sacubitril-valsartan (ENTRESTO) 24-26 MG Take 1 tablet by mouth 2 (two) times daily. 10/01/18  Yes Strader, [place] M, PA-C  warfarin (COUMADIN) 7.5 MG tablet Take 1 tablet (7.5 mg total) by mouth daily. 08/03/18  Yes Hongalgi, Lenis Dickinson, MD  spironolactone (ALDACTONE) 25 MG tablet Take 1 tablet (25 mg total) by mouth daily. 09/09/18 01/06/19 Yes Strader, Fransisco Hertz, PA-C  acetaminophen (TYLENOL) 325 MG tablet Take 2 tablets (650 mg total) by mouth every 6 (six) hours as needed for mild pain (or Fever >/= 101). 08/12/18   Swayze, Ava, DO  albuterol (PROAIR HFA) 108 (90 Base) MCG/ACT inhaler Inhale 2 puffs into the lungs every 6 (six) hours as needed for  wheezing or shortness of breath. 08/02/18   Hongalgi, Lenis Dickinson, MD  furosemide (LASIX) 40 MG tablet Take 0.5 tablets (20 mg total) by mouth daily. 01/06/19   Isla Pence, MD    Family History Family History  Problem Relation Age of Onset  . Coronary artery disease Mother   . Diabetes Mother   . Heart disease Mother        Before age 46 and  CHF  . Hypertension Mother   . Other Mother        varicose veins  . Heart attack Mother   . Heart disease Sister   . Hypertension Sister   . Heart attack Sister   . Heart disease Brother   . Hyperlipidemia Brother   . Hypertension Brother   . Other Brother        varicose veins  . Heart attack Brother   .  Heart disease Son 29       Heart Disease before age 38- Open hear surgery  . Deep vein thrombosis Son   . Heart attack Son   . Coronary artery disease Other   . Diabetes Daughter   . Hypertension Daughter   . Other Daughter        varicose veins    Social History Social History   Tobacco Use  . Smoking status: Former Smoker    Types: Cigarettes    Quit date: 02/25/2000    Years since quitting: 18.8  . Smokeless tobacco: Former Network engineer Use Topics  . Alcohol use: No  . Drug use: No     Allergies   Penicillins and Statins   Review of Systems Review of Systems  All other systems reviewed and are negative.    Physical Exam Updated Vital Signs BP 104/65 (BP Location: Right Arm)   Pulse 79   Temp 98.3 F (36.8 C) (Oral)   Resp 16   Ht 5\' 6"  (1.676 m)   Wt 69 kg   SpO2 95%   BMI 24.55 kg/m   Physical Exam Vitals signs and nursing note reviewed.  Constitutional:      Appearance: Normal appearance.  HENT:     Head: Normocephalic and atraumatic.     Right Ear: External ear normal.     Left Ear: External ear normal.     Nose: Nose normal.     Mouth/Throat:     Mouth: Mucous membranes are moist.     Pharynx: Oropharynx is clear.  Eyes:     Extraocular Movements: Extraocular movements intact.      Conjunctiva/sclera: Conjunctivae normal.     Pupils: Pupils are equal, round, and reactive to light.  Neck:     Musculoskeletal: Normal range of motion and neck supple.  Cardiovascular:     Rate and Rhythm: Normal rate and regular rhythm.     Pulses: Normal pulses.     Heart sounds: Normal heart sounds.  Pulmonary:     Effort: Pulmonary effort is normal.     Breath sounds: Normal breath sounds.  Abdominal:     General: Abdomen is flat. Bowel sounds are normal.     Palpations: Abdomen is soft.  Musculoskeletal: Normal range of motion.  Skin:    General: Skin is warm.     Capillary Refill: Capillary refill takes less than 2 seconds.  Neurological:     General: No focal deficit present.     Mental Status: He is alert and oriented to person, place, and time.  Psychiatric:        Mood and Affect: Mood normal.        Behavior: Behavior normal.        Thought Content: Thought content normal.        Judgment: Judgment normal.      ED Treatments / Results  Labs (all labs ordered are listed, but only abnormal results are displayed) Labs Reviewed  DIGOXIN LEVEL - Abnormal; Notable for the following components:      Result Value   Digoxin Level 0.5 (*)    All other components within normal limits  PROTIME-INR - Abnormal; Notable for the following components:   Prothrombin Time 23.0 (*)    INR 2.1 (*)    All other components within normal limits  BASIC METABOLIC PANEL - Abnormal; Notable for the following components:   Sodium 130 (*)    Potassium 6.5 (*)  CO2 20 (*)    Glucose, Bld 153 (*)    BUN 83 (*)    Creatinine, Ser 1.75 (*)    GFR calc non Af Amer 36 (*)    GFR calc Af Amer 42 (*)    All other components within normal limits  CBC WITH DIFFERENTIAL/PLATELET - Abnormal; Notable for the following components:   WBC 13.0 (*)    RBC 4.17 (*)    Hemoglobin 12.6 (*)    Platelets 441 (*)    Neutro Abs 9.1 (*)    Abs Immature Granulocytes 0.22 (*)    All other components  within normal limits  CBG MONITORING, ED    EKG EKG Interpretation  Date/Time:  Thursday January 06 2019 10:47:50 EST Ventricular Rate:  72 PR Interval:    QRS Duration: 93 QT Interval:  334 QTC Calculation: 366 R Axis:   100 Text Interpretation: Atrial fibrillation Right axis deviation Low voltage, extremity leads Anteroseptal infarct, old No significant change since last tracing Confirmed by Isla Pence [phone]) on 01/06/2019 10:51:05 AM   Radiology US Renal  Result Date: 01/06/2019 CLINICAL DATA:  Chronic kidney disease EXAM: RENAL / URINARY TRACT ULTRASOUND COMPLETE COMPARISON:  None. FINDINGS: Right Kidney: Renal measurements: 11.8 x 4.9 x 5.6 cm = volume: 168 mL . Echogenicity within normal limits. No mass or hydronephrosis visualized. Left Kidney: Renal measurements: 11.7 x 4.6 x 4.7 cm = volume: 132 mL. Echogenicity within normal limits. No mass or hydronephrosis visualized. Bladder: Appears normal for degree of bladder distention. Other: None. IMPRESSION: No hydronephrosis or other significant abnormality. Electronically Signed   By: Macy Mis M.D.   On: 01/06/2019 10:32    Procedures Procedures (including critical care time)  Medications Ordered in ED Medications  insulin aspart (novoLOG) injection 5 Units (5 Units Intravenous Given 01/06/19 1135)    And  dextrose 50 % solution 50 mL (50 mLs Intravenous Given 01/06/19 1135)  sodium bicarbonate injection 50 mEq (50 mEq Intravenous Given 01/06/19 1137)     Initial Impression / Assessment and Plan / ED Course  I have reviewed the triage vital signs and the nursing notes.  Pertinent labs & imaging results that were available during my care of the patient were reviewed by me and considered in my medical decision making (see chart for details).       Pt is hyperkalemic.  He will be given bicarb/insulin/glucose.  He is on aldactone and on lasix.  I will hold the aldactone and decrease the lasix.  Pt d/w Dr.  Arty Baumgartner (nephrology) who will follow as an outpatient.  Final Clinical Impressions(s) / ED Diagnoses   Final diagnoses:  Hyperkalemia  Stage 3b chronic kidney disease    ED Discharge Orders         Ordered    furosemide (LASIX) 40 MG tablet  Daily     01/06/19 1231           Isla Pence, MD 01/06/19 1233

## 2019-01-06 NOTE — ED Notes (Signed)
CRITICAL VALUE ALERT  Critical Value:  k 6.5  Date & Time Notied:  [id number] 01/06/19  Provider Notified: Gilford Raid  Orders Received/Actions taken:

## 2019-02-04 ENCOUNTER — Encounter (INDEPENDENT_AMBULATORY_CARE_PROVIDER_SITE_OTHER): Payer: Self-pay

## 2019-02-04 ENCOUNTER — Ambulatory Visit (INDEPENDENT_AMBULATORY_CARE_PROVIDER_SITE_OTHER): Payer: Medicare Other | Admitting: Student

## 2019-02-04 ENCOUNTER — Other Ambulatory Visit: Payer: Self-pay

## 2019-02-04 ENCOUNTER — Encounter: Payer: Self-pay | Admitting: Student

## 2019-02-04 VITALS — BP 122/73 | HR 85 | Temp 96.9°F | Ht 66.0 in | Wt 159.0 lb

## 2019-02-04 DIAGNOSIS — E785 Hyperlipidemia, unspecified: Secondary | ICD-10-CM

## 2019-02-04 DIAGNOSIS — I5022 Chronic systolic (congestive) heart failure: Secondary | ICD-10-CM

## 2019-02-04 DIAGNOSIS — I6521 Occlusion and stenosis of right carotid artery: Secondary | ICD-10-CM | POA: Diagnosis not present

## 2019-02-04 DIAGNOSIS — I4821 Permanent atrial fibrillation: Secondary | ICD-10-CM

## 2019-02-04 DIAGNOSIS — I2510 Atherosclerotic heart disease of native coronary artery without angina pectoris: Secondary | ICD-10-CM | POA: Diagnosis not present

## 2019-02-04 MED ORDER — FUROSEMIDE 40 MG PO TABS: 40.0000 mg | ORAL_TABLET | Freq: Every day | ORAL | 0 refills | Status: DC

## 2019-02-04 MED ORDER — CARVEDILOL 6.25 MG PO TABS: 9.3750 mg | ORAL_TABLET | Freq: Two times a day (BID) | ORAL | 3 refills | Status: DC

## 2019-02-04 NOTE — Patient Instructions (Signed)
Medication Instructions:  INCREASE COREG TO 9.375 -(1 1/2 TABLETS)  TWO TIMES DAILY   Labwork: I WILL REQUEST LABS FROM PCP   Testing/Procedures: NONE  Follow-Up: Your physician recommends that you schedule a follow-up appointment in: 2 MONTHS    Any Other Special Instructions Will Be Listed Below (If Applicable).     If you need a refill on your cardiac medications before your next appointment, please call your pharmacy.

## 2019-02-04 NOTE — Progress Notes (Signed)
Cardiology Office Note    Date:  02/05/2019   ID:  Oscar [name], DOB 03-30-1940, MRN [id number]  PCP:  Adaline Sill, NP  Cardiologist: Minus Breeding, MD    Chief Complaint  Patient presents with  . Follow-up    1 month visit    History of Present Illness:    Oscar [name] is a 78 y.o. male with past medical history of chronic combined systolic and diastolic CHF (EF [id number] by echo in 07/2018, at 35-40% by repeat echo in 11/2018), CAD (catheterization in 07/2018 showing 100% Proximal-RCA stenosis, 99% Proximal-LAD, 75% Ost LM, 70% LCx, 40% PDA, and 30% Ost LCx with patient declining CABG along with declining arthrectomy with Impella support), permanent atrial fibrillation, carotid artery stenosis (s/p L CEA in 2007 and R CEA in 07/2018), HTN and HLD who presents to the office today for 1 month follow-up.  He was last examined by Dr. Percival Spanish in 12/2018 and denied any recent chest pain or dyspnea on exertion at that time. His cath films had been reviewed of ED visit showed Na+ 130, K+ 6.5 and creatinine 1.75.  It appears that Spironolactone was disconti with Dr. Burt Knack and it was thought he would be too high-risk for PCI of the LAD and that he might not experience significant benefit in regards to his symptoms. He was continued on ASA, Digoxin , Zetia, Entresto and Spironolactone with Coreg being titrated from 6.25mg  BID to 9.375 mg BID with continued medication titration recommended.   By review of notes, he did present to [facility] ED on 01/06/2019 after having blood work from his PCP and being informed his kidneys were not functioning well.  Lab work at the timenued at that time and Lasix was reduced to 20 mg daily.  In talking with the patient today, he reports having lab work with his PCP two weeks ago and everything was "normal". He denies any recent chest pain, dyspnea on exertion, orthopnea, PND or lower extremity edema. He has remained on Lasix 40mg  daily and Coreg was  reduced back to 6.25mg  BID (patient unsure why this was done and records are not available).   He has been walking for 20-30 minutes most days of the week and denies any anginal symptoms. Says he feels the best he has in months and feels that his strength is improving.    Past Medical History:  Diagnosis Date  . Anemia   . Atrial fibrillation ([place])    permanent  . CAD (coronary artery disease)    a. cath on 07/29/2018 showed 100% Proximal-RCA stenosis, 99% Proximal-LAD, 75% Ost LM, 70% LCx, 40% PDA, and 30% Ost LCx. Declined CABG  . Cardiomyopathy    EF 35% in the past--Improved to 55% echo 2/10. Moderately severe mitral regurgitation in the past, now improved,  . Carotid artery stenosis   . CHF (congestive heart failure) ([place]) 2002  . COPD (chronic obstructive pulmonary disease) ([place])   . Coronary artery disease    non obstructive  . Diabetes mellitus   . Dyslipidemia   . History of tobacco abuse   . Nephrolithiasis   . Tremors of nervous system     Past Surgical History:  Procedure Laterality Date  . CAROTID ENDARTERECTOMY Left 10-21-05   cea  . CATARACT EXTRACTION W/PHACO Left 09/10/2018   Procedure: CATARACT EXTRACTION PHACO AND INTRAOCULAR LENS PLACEMENT LEFT EYE;  Surgeon: Baruch Goldmann, MD;  Location: [facility];  Service: Ophthalmology;  Laterality: Left;  left  .  CATARACT EXTRACTION W/PHACO Right 11/12/2018   Procedure: CATARACT EXTRACTION PHACO AND INTRAOCULAR LENS PLACEMENT RIGHT EYE;  Surgeon: Baruch Goldmann, MD;  Location: [facility];  Service: Ophthalmology;  Laterality: Right;  right  . ENDARTERECTOMY Right 08/11/2018   Procedure: ENDARTERECTOMY CAROTID RIGHT;  Surgeon: [name], MD;  Location: [facility] OR;  Service: Vascular;  Laterality: Right;  . left carotid endarterectomy    . PATCH ANGIOPLASTY Right 08/11/2018   Procedure: PATCH ANGIOPLAST USING HEMASHIELD PLATINUM FINESSE;  Surgeon: [name], MD;  Location: [facility];  Service: Vascular;  Laterality: Right;  .  RIGHT/LEFT HEART CATH AND CORONARY ANGIOGRAPHY N/A 07/29/2018   Procedure: RIGHT/LEFT HEART CATH AND CORONARY ANGIOGRAPHY;  Surgeon: Jolaine Artist, MD;  Location: [facility];  Service: Cardiovascular;  Laterality: N/A;  . TONSILLECTOMY      Current Medications: Outpatient Medications Prior to Visit  Medication Sig Dispense Refill  . acetaminophen (TYLENOL) 325 MG tablet Take 2 tablets (650 mg total) by mouth every 6 (six) hours as needed for mild pain (or Fever >/= 101). 30 tablet 0  . albuterol (PROAIR HFA) 108 (90 Base) MCG/ACT inhaler Inhale 2 puffs into the lungs every 6 (six) hours as needed for wheezing or shortness of breath.    Marland Kitchen aspirin EC 81 MG EC tablet Take 1 tablet (81 mg total) by mouth daily. 30 tablet 0  . digoxin (LANOXIN) 0.125 MG tablet TAKE 1 TABLET DAILY (Patient taking differently: Take 0.0625 mg by mouth daily. Take 1/2 tablet by mouth once daily) 90 tablet 3  . ezetimibe (ZETIA) 10 MG tablet Take 10 mg by mouth daily.      . fenofibrate 160 MG tablet Take 160 mg by mouth daily.    Marland Kitchen ipratropium (ATROVENT) 0.02 % nebulizer solution Take 2.5 mLs (0.5 mg dose) by nebulization 2 (two) times daily.    Marland Kitchen linagliptin (TRADJENTA) 5 MG TABS tablet Take 5 mg by mouth daily.     . Multiple Vitamin (MULTIVITAMIN) tablet Take 1 tablet by mouth daily.    . Omega-3 Fatty Acids (FISH OIL) 1200 MG CAPS Take 1,000 mg by mouth daily.     . pantoprazole (PROTONIX) 40 MG tablet Take 1 tablet (40 mg total) by mouth daily. 30 tablet 0  . pravastatin (PRAVACHOL) 40 MG tablet Take 80 mg by mouth daily.     . repaglinide (PRANDIN) 2 MG tablet Take 2 mg by mouth 2 (two) times daily before a meal.     . sacubitril-valsartan (ENTRESTO) 24-26 MG Take 1 tablet by mouth 2 (two) times daily. 28 tablet 0  . warfarin (COUMADIN) 7.5 MG tablet Take 1 tablet (7.5 mg total) by mouth daily.    . carvedilol (COREG) 6.25 MG tablet Take 1.5 tablets (9.375 mg total) by mouth 2 (two) times daily with a  meal. 270 tablet 3  . furosemide (LASIX) 40 MG tablet Take 0.5 tablets (20 mg total) by mouth daily. 30 tablet 0  . metFORMIN (GLUCOPHAGE-XR) 500 MG 24 hr tablet Take 500 mg by mouth 2 (two) times daily.      No facility-administered medications prior to visit.     Allergies:   Penicillins, Statins, and Spironolactone   Social History   Socioeconomic History  . Marital status: Married    Spouse name: Not on file  . Number of children: 5  . Years of education: 45  . Highest education level: Not on file  Occupational History  . Occupation: Retired  Tobacco Use  .  Smoking status: Former Smoker    Types: Cigarettes    Quit date: 02/25/2000    Years since quitting: 18.9  . Smokeless tobacco: Former Network engineer and Sexual Activity  . Alcohol use: No  . Drug use: No  . Sexual activity: Not on file  Other Topics Concern  . Not on file  Social History Narrative   Lives at home with his wife.   Right-handed.   3-4 cups caffeine per day.   Social Determinants of Health   Financial Resource Strain:   . Difficulty of Paying Living Expenses: Not on file  Food Insecurity:   . Worried About Charity fundraiser in the Last Year: Not on file  . Ran Out of Food in the Last Year: Not on file  Transportation Needs:   . Lack of Transportation (Medical): Not on file  . Lack of Transportation (Non-Medical): Not on file  Physical Activity:   . Days of Exercise per Week: Not on file  . Minutes of Exercise per Session: Not on file  Stress:   . Feeling of Stress : Not on file  Social Connections:   . Frequency of Communication with Friends and Family: Not on file  . Frequency of Social Gatherings with Friends and Family: Not on file  . Attends Religious Services: Not on file  . Active Member of Clubs or Organizations: Not on file  . Attends Archivist Meetings: Not on file  . Marital Status: Not on file     Family History:  The patient's family history includes Coronary  artery disease in his mother and another family member; Deep vein thrombosis in his son; Diabetes in his daughter and mother; Heart attack in his brother, mother, sister, and son; Heart disease in his brother, mother, and sister; Heart disease (age of onset: 61) in his son; Hyperlipidemia in his brother; Hypertension in his brother, daughter, mother, and sister; Other in his brother, daughter, and mother.   Review of Systems:   Please see the history of present illness.     General:  No chills, fever, night sweats or weight changes.  Cardiovascular:  No chest pain, dyspnea on exertion, orthopnea, palpitations, paroxysmal nocturnal dyspnea. Positive for edema (now resolved).  Dermatological: No rash, lesions/masses Respiratory: No cough, dyspnea Urologic: No hematuria, dysuria Abdominal:   No nausea, vomiting, diarrhea, bright red blood per rectum, melena, or hematemesis Neurologic:  No visual changes, wkns, changes in mental status.  All other systems reviewed and are otherwise negative except as noted above.   Physical Exam:    VS:  BP 122/73   Pulse 85   Temp (!) 96.9 F (36.1 C) (Temporal)   Ht 5\' 6"  (1.676 m)   Wt 159 lb (72.1 kg)   SpO2 98%   BMI 25.66 kg/m    General: Well developed, well nourished,male appearing in no acute distress. Head: Normocephalic, atraumatic, sclera non-icteric, no xanthomas, nares are without discharge.  Neck: No carotid bruits. JVD not elevated.  Lungs: Respirations regular and unlabored, without wheezes or rales.  Heart: Irregularly irregular. No S3 or S4.  No murmur, no rubs, or gallops appreciated. Abdomen: Soft, non-tender, non-distended with normoactive bowel sounds. No hepatomegaly. No rebound/guarding. No obvious abdominal masses. Msk:  Strength and tone appear normal for age. No joint deformities or effusions. Extremities: No clubbing or cyanosis. No lower extremity edema.  Distal pedal pulses are 2+ bilaterally. Neuro: Alert and oriented X  3. Moves all extremities spontaneously. No focal  deficits noted. Psych:  Responds to questions appropriately with a normal affect. Skin: No rashes or lesions noted  Wt Readings from Last 3 Encounters:  02/04/19 159 lb (72.1 kg)  01/06/19 152 lb 1.9 oz (69 kg)  12/29/18 153 lb (69.4 kg)     Studies/Labs Reviewed:   EKG:  EKG is not ordered today. EKG from 01/06/2019 showed atrial fibrillation, HR 72 with RAD.   Recent Labs: 07/21/2018: B Natriuretic Peptide 2,433.0 08/08/2018: Magnesium 2.0 08/11/2018: ALT 32 01/06/2019: BUN 83; Creatinine, Ser 1.75; Hemoglobin 12.6; Platelets 441; Potassium 6.5; Sodium 130   Lipid Panel    Component Value Date/Time   CHOL 95 07/25/2018 0443   TRIG 113 07/25/2018 0443   HDL 17 (L) 07/25/2018 0443   CHOLHDL 5.6 07/25/2018 0443   VLDL 23 07/25/2018 0443   LDLCALC 55 07/25/2018 0443    Additional studies/ records that were reviewed today include:   Cardiac Catheterization: 07/2018  Prox RCA lesion is 100% stenosed.  Prox LAD lesion is 99% stenosed.  Ost LM lesion is 75% stenosed.  Prox Cx to Mid Cx lesion is 70% stenosed.  LPDA lesion is 40% stenosed.  Ost Cx to Prox Cx lesion is 30% stenosed.   Findings:  Ao = 148/81 (109)  LV = 13215 RA = 7 RV = 59/6 PA = 59/24 (37) PCW = 22 Fick cardiac output/index = 4.1/2.3 PVR = 3.3 WU Ao sat = 93% PA sat = 62%, 64%  Assessment: 1. Severe 3v CAD in left dominant system with 70-80% LM lesion and 99% proximal LAD lesion 2. Ischemic CM EF 25% 3. Mild to moderate pulmonary HTN 4. Moderately reduced CO  Plan/Discussion:  Will d/w with TCTS regarding possible CABG and [place]. If not surgical candidate will need PCI of LAD with atherectomy +/- LM PCI.   Echocardiogram: 11/2018 IMPRESSIONS    1. Left ventricular ejection fraction, by visual estimation, is 35 to 40%. The left ventricle has normal function. Mildly increased left ventricular size. There is mildly increased left  ventricular hypertrophy.  2. Septal apical and inferior wall hypokinesis.  3. Global right ventricle has normal systolic function.The right ventricular size is normal. No increase in right ventricular wall thickness.  4. Left atrial size was moderately dilated.  5. Right atrial size was moderately dilated.  6. Moderate calcification of the mitral valve leaflet(s).  7. Moderate mitral annular calcification.  8. Moderate thickening of the mitral valve leaflet(s).  9. The mitral valve is normal in structure. Mild mitral valve regurgitation. No evidence of mitral stenosis. 10. The tricuspid valve is normal in structure. Tricuspid valve regurgitation is mild. 11. The aortic valve is tricuspid Aortic valve regurgitation is mild by color flow Doppler. Mild aortic valve sclerosis without stenosis. 12. The pulmonic valve was normal in structure. Pulmonic valve regurgitation is mild by color flow Doppler. 13. Moderately elevated pulmonary artery systolic pressure. 14. The inferior vena cava is normal in size with greater than 50% respiratory variability, suggesting right atrial pressure of 3 mmHg.  Assessment:    1. Chronic systolic HF (heart failure) ([place])   2. Coronary artery disease involving native coronary artery of native heart without angina pectoris   3. Permanent atrial fibrillation ([facility])   4. Carotid artery stenosis, symptomatic, right   5. Hyperlipidemia LDL goal <70      Plan:   In order of problems listed above:  1. Chronic Systolic CHF - he has a known reduced EF of 25-30% by echo in 07/2018,  at 35-40% by repeat echo in 11/2018. He denies any recent dyspnea on exertion, orthopnea, PND or edema. Appears euvolemic by examination. - Spironolactone was discontinued during recent ED evaluation due to hyperkalemia. I suspect Coreg was reduced by his PCP due to soft BP but given that BP is at 122/73 during today's visit and that he is no longer on Spironolactone, will titrate Coreg from  6.25mg  BID to 9.375mg  BID. I encouraged him to follow BP at home and call back with readings in 2 weeks. If BP allows, would further titrate to 12.5mg  BID. Continue Digoxin, Entresto and Lasix at current dosing. Will request a copy of labs from PCP.   2. CAD - catheterization in 07/2018 showed 100% Proximal-RCA stenosis, 99% Proximal-LAD, 75% Ost LM, 70% LCx, 40% PDA, and 30% Ost LCx with patient declining CABG along with declining arthrectomy with Impella support. Felt that medical management is the best option at this time.  - he denies any recent chest pain or dyspnea on exertion. - continue ASA, BB, statin and Zetia.   3. Permanent Atrial Fibrillation - he denies any recent palpitations. Continue Coreg for rate control with dose adjustment as outlined above.  - he denies any evidence of active bleeding. Continue Coumadin for anticoagulation.   4. Carotid Artery Stenosis  - s/p L CEA in 2007 and R CEA in 07/2018. Followed by Vascular Surgery. Continue ASA and statin therapy.   5. HLD - LDL at 55 in 06/2018. He remains on Zetia, Fenofibrate and Pravastatin.    Medication Adjustments/Labs and Tests Ordered: Current medicines are reviewed at length with the patient today.  Concerns regarding medicines are outlined above.  Medication changes, Labs and Tests ordered today are listed in the Patient Instructions below. Patient Instructions  Medication Instructions:  INCREASE COREG TO 9.375 -(1 1/2 TABLETS)  TWO TIMES DAILY   Labwork: I WILL REQUEST LABS FROM PCP   Testing/Procedures: NONE  Follow-Up: Your physician recommends that you schedule a follow-up appointment in: 2 MONTHS   Any Other Special Instructions Will Be Listed Below (If Applicable).  If you need a refill on your cardiac medications before your next appointment, please call your pharmacy.    Signed, Erma Heritage, PA-C  02/05/2019 7:57 AM    [facility] [address] [place],  [place] [address] Phone: [phone] Fax: [phone]

## 2019-02-05 ENCOUNTER — Encounter: Payer: Self-pay | Admitting: Student

## 2019-02-23 ENCOUNTER — Telehealth: Payer: Self-pay | Admitting: Student

## 2019-02-23 NOTE — Telephone Encounter (Signed)
Patient has never reduced dose of lasix, still taking 40 mg daily.Had lab work form pcp earlier this month, have requested.

## 2019-02-23 NOTE — Telephone Encounter (Signed)
Patient going for his long walk  Call patient after 3:00 pm  His mail pharmacy told him there is problem with dose/instructions for  his furosemide that was called in

## 2019-02-24 NOTE — Telephone Encounter (Signed)
    Lasix had been reduced to 20mg  daily by EPD and he was continued on that dosing at the time of his visit. If he has remained on Lasix 40mg  daily, agree with obtaining a copy of recent labs for if creatinine remains elevated, this will need to be reduced to 20mg  daily.   Signed, Erma Heritage, PA-C 02/24/2019, 11:02 AM Pager: [phone]

## 2019-03-02 NOTE — Telephone Encounter (Signed)
Second request for labs from pcp

## 2019-03-04 MED ORDER — FUROSEMIDE 40 MG PO TABS: 40.0000 mg | ORAL_TABLET | Freq: Every day | ORAL | 3 refills | Status: DC

## 2019-03-04 NOTE — Telephone Encounter (Signed)
    Please let the patient know I received a copy of his most recent lab work from his PCP and renal function had improved with creatinine at 1.29. Na+ stable at 139 and K+ 4.7.  He can continue on Lasix 40 mg daily.  Signed, Erma Heritage, PA-C 03/04/2019, 12:43 PM Pager: [phone]

## 2019-03-04 NOTE — Telephone Encounter (Signed)
[id number] 

## 2019-03-04 NOTE — Telephone Encounter (Signed)
Patient will resume lasix 40 mg daily, [facility]

## 2019-04-12 ENCOUNTER — Telehealth: Payer: Self-pay | Admitting: Student

## 2019-04-12 NOTE — Telephone Encounter (Signed)
     Please let the patient know I reviewed his blood pressure log. I appreciate him keeping a record of this. SBP has been variable from the low 110's to 130's and HR has been in the 60's to 70's. Overall, his readings appear well controlled.  As discussed at the time of his last visit, would recommend titration of Coreg to 12.5 mg twice daily in the setting of his cardiomyopathy.  Signed, Erma Heritage, PA-C 04/12/2019, 1:01 PM Pager: [phone]

## 2019-04-14 MED ORDER — CARVEDILOL 12.5 MG PO TABS: 12.5000 mg | ORAL_TABLET | Freq: Two times a day (BID) | ORAL | 3 refills | Status: DC

## 2019-04-14 NOTE — Addendum Note (Signed)
Addended by: Levonne Hubert on: 04/14/2019 03:28 PM   Modules accepted: Orders

## 2019-04-14 NOTE — Telephone Encounter (Signed)
Pt notified and voiced understanding 

## 2019-04-14 NOTE — Telephone Encounter (Signed)
Called to notify pt. NA, Left msg to call back.

## 2019-04-18 DIAGNOSIS — I10 Essential (primary) hypertension: Secondary | ICD-10-CM | POA: Insufficient documentation

## 2019-04-18 DIAGNOSIS — I5032 Chronic diastolic (congestive) heart failure: Secondary | ICD-10-CM | POA: Insufficient documentation

## 2019-04-18 DIAGNOSIS — Z7189 Other specified counseling: Secondary | ICD-10-CM | POA: Insufficient documentation

## 2019-04-18 NOTE — Progress Notes (Signed)
Cardiology Office Note   Date:  04/20/2019   ID:  Oscar [name], DOB [birth year], MRN [id number]  PCP:  Adaline Sill, NP  Cardiologist:   Minus Breeding, MD   Chief Complaint  Patient presents with  . Coronary Artery Disease      History of Present Illness: Oscar [name] is a 79 y.o. male who presents for follow up of cardiomyopathy.  He was in the ED in May and an EF of 30% which was lower than previous.  A cath was performed by Dr. Haroldine Laws on 07/29/2018 and showed 100% Proximal-RCA stenosis, 99% Proximal-LAD, 75% Ost LM, 70% LCx, 40% PDA, and 30% Ost LCx. Cardiac MRI was performed to assess viability and showed a mixed picture with the mid anteroseptal/inferoseptal and the apical inferior and true apex wall segments unlikely to improve with revascularization however the other affected wall segments appeared viable. CT surgery was consulted but the patient declined CABG. Options regarding high risk PCI with atherectomy and Impella support were reviewed but the patient declined this as well and wished to pursue medical management. Also refused CEA during admission .  He presented back to the hospital in June with AMS.  He eventually did have CEA.  I did review his films with Dr. Burt Knack and we agreed that PCI would be high risk and difficult for procedural success.  He did have an echocardiogram which demonstrated that his EF was up a little bit from 25 to 30% and perhaps mildly increased to 35 to 40%.  He did have some moderately elevated pulmonary pressures.  We have been titrating his meds.    Since he was last seen he has had no new cardiovascular complaints.  He walks daily and some of the large stores. The patient denies any new symptoms such as chest discomfort, neck or arm discomfort. There has been no new shortness of breath, PND or orthopnea. There have been no reported palpitations, presyncope or syncope.    Past Medical History:  Diagnosis Date  . Anemia   . Atrial  fibrillation ([place])    permanent  . CAD (coronary artery disease)    a. cath on 07/29/2018 showed 100% Proximal-RCA stenosis, 99% Proximal-LAD, 75% Ost LM, 70% LCx, 40% PDA, and 30% Ost LCx. Declined CABG  . Cardiomyopathy    EF 35% in the past--Improved to 55% echo 2/10. Moderately severe mitral regurgitation in the past, now improved,  . Carotid artery stenosis   . CHF (congestive heart failure) ([place]) 2002  . COPD (chronic obstructive pulmonary disease) ([place])   . Coronary artery disease    non obstructive  . Diabetes mellitus   . Dyslipidemia   . History of tobacco abuse   . Nephrolithiasis   . Tremors of nervous system     Past Surgical History:  Procedure Laterality Date  . CAROTID ENDARTERECTOMY Left 10-21-05   cea  . CATARACT EXTRACTION W/PHACO Left 09/10/2018   Procedure: CATARACT EXTRACTION PHACO AND INTRAOCULAR LENS PLACEMENT LEFT EYE;  Surgeon: Baruch Goldmann, MD;  Location: [facility];  Service: Ophthalmology;  Laterality: Left;  left  . CATARACT EXTRACTION W/PHACO Right 11/12/2018   Procedure: CATARACT EXTRACTION PHACO AND INTRAOCULAR LENS PLACEMENT RIGHT EYE;  Surgeon: Baruch Goldmann, MD;  Location: [facility];  Service: Ophthalmology;  Laterality: Right;  right  . ENDARTERECTOMY Right 08/11/2018   Procedure: ENDARTERECTOMY CAROTID RIGHT;  Surgeon: [name], MD;  Location: [facility];  Service: Vascular;  Laterality: Right;  . left carotid  endarterectomy    . PATCH ANGIOPLASTY Right 08/11/2018   Procedure: PATCH ANGIOPLAST USING HEMASHIELD PLATINUM FINESSE;  Surgeon: [name], MD;  Location: [facility];  Service: Vascular;  Laterality: Right;  . RIGHT/LEFT HEART CATH AND CORONARY ANGIOGRAPHY N/A 07/29/2018   Procedure: RIGHT/LEFT HEART CATH AND CORONARY ANGIOGRAPHY;  Surgeon: Jolaine Artist, MD;  Location: [facility];  Service: Cardiovascular;  Laterality: N/A;  . TONSILLECTOMY       Current Outpatient Medications  Medication Sig Dispense Refill  . acetaminophen  (TYLENOL) 325 MG tablet Take 2 tablets (650 mg total) by mouth every 6 (six) hours as needed for mild pain (or Fever >/= 101). 30 tablet 0  . albuterol (PROAIR HFA) 108 (90 Base) MCG/ACT inhaler Inhale 2 puffs into the lungs every 6 (six) hours as needed for wheezing or shortness of breath.    Marland Kitchen aspirin EC 81 MG EC tablet Take 1 tablet (81 mg total) by mouth daily. 30 tablet 0  . carvedilol (COREG) 12.5 MG tablet Take 1 tablet (12.5 mg total) by mouth 2 (two) times daily. 180 tablet 3  . digoxin (LANOXIN) 0.125 MG tablet TAKE 1 TABLET DAILY (Patient taking differently: Take 0.125 mg by mouth daily. ) 90 tablet 3  . ezetimibe (ZETIA) 10 MG tablet Take 10 mg by mouth daily.      . fenofibrate 160 MG tablet Take 160 mg by mouth daily.    . furosemide (LASIX) 40 MG tablet Take 1 tablet (40 mg total) by mouth daily. 90 tablet 3  . ipratropium (ATROVENT) 0.02 % nebulizer solution Take 2.5 mLs (0.5 mg dose) by nebulization 2 (two) times daily.    Marland Kitchen JARDIANCE 10 MG TABS tablet Take 10 mg by mouth daily.    Marland Kitchen linagliptin (TRADJENTA) 5 MG TABS tablet Take 5 mg by mouth daily.     . Multiple Vitamin (MULTIVITAMIN) tablet Take 1 tablet by mouth daily.    . Omega-3 Fatty Acids (FISH OIL) 1200 MG CAPS Take 1,000 mg by mouth daily.     . pantoprazole (PROTONIX) 40 MG tablet Take 1 tablet (40 mg total) by mouth daily. 30 tablet 0  . pravastatin (PRAVACHOL) 40 MG tablet Take 80 mg by mouth daily.     . sacubitril-valsartan (ENTRESTO) 24-26 MG Take 1 tablet by mouth 2 (two) times daily. 28 tablet 0  . warfarin (COUMADIN) 7.5 MG tablet Take 1 tablet (7.5 mg total) by mouth daily.     No current facility-administered medications for this visit.    Allergies:   Penicillins, Statins, and Spironolactone    ROS:  Please see the history of present illness.   Otherwise, review of systems are positive for none.   All other systems are reviewed and negative.    PHYSICAL EXAM: VS:  BP 118/60   Pulse (!) 55   Ht 5'  6" (1.676 m)   Wt 158 lb (71.7 kg)   BMI 25.50 kg/m  , BMI Body mass index is 25.5 kg/m. GENERAL:  Well appearing NECK:  No jugular venous distention, waveform within normal limits, carotid upstroke brisk and symmetric, no bruits, no thyromegaly LUNGS:  Clear to auscultation bilaterally CHEST:  Unremarkable HEART:  PMI not displaced or sustained,S1 and S2 within normal limits, no S3, no S4, no clicks, no rubs, 2 out of 6 systolic murmur radiating anteriorly outflow tract, no diastolic murmur murmurs ABD:  Flat, positive bowel sounds normal in frequency in pitch, no bruits, no rebound, no guarding,  no midline pulsatile mass, no hepatomegaly, no splenomegaly EXT:  2 plus pulses throughout, no edema, no cyanosis no clubbing     EKG:  EKG ordered  Atrial fibrillation, rate 55, axis within normal limits, intervals within normal limit.    Recent Labs: 07/21/2018: B Natriuretic Peptide 2,433.0 08/08/2018: Magnesium 2.0 08/11/2018: ALT 32 01/06/2019: BUN 83; Creatinine, Ser 1.75; Hemoglobin 12.6; Platelets 441; Potassium 6.5; Sodium 130    Lipid Panel    Component Value Date/Time   CHOL 95 07/25/2018 0443   TRIG 113 07/25/2018 0443   HDL 17 (L) 07/25/2018 0443   CHOLHDL 5.6 07/25/2018 0443   VLDL 23 07/25/2018 0443   LDLCALC 55 07/25/2018 0443      Wt Readings from Last 3 Encounters:  04/20/19 158 lb (71.7 kg)  02/04/19 159 lb (72.1 kg)  01/06/19 152 lb 1.9 oz (69 kg)      Other studies Reviewed: Additional studies/ records that were reviewed today include:   None Review of the above records demonstrates: NA   ASSESSMENT AND PLAN:  CAD:    The patient has no new sypmtoms.  No further cardiovascular testing is indicated.  We will continue with aggressive risk reduction and meds as listed.  As above this is been reviewed and we are going to manage this medically.  CHRONIC COMBINED SYSTOLIC AND DIASTOLIC HF:    His ejection fraction was slightly improved.  I do not think he  will tolerate further med titration.  He will remain on the meds as listed.  ATRIAL FIB: He tolerates rate control and anticoagulation.  No change in therapy.   HTN:   This is managed in the context of treating his heart failure.   HYPERLIPIDEMIA:    LDL was 55.  No change in therapy.  DM: He said his A1c was 8.9 which is up previously and he is starting on Jardiance.  I will defer to Adaline Sill, NP   COVID EDUCATION: He is on the waiting list to get the vaccine.  Current medicines are reviewed at length with the patient today.  The patient does not have concerns regarding medicines.  The following changes have been made:  none  Labs/ tests ordered today include: None  Orders Placed This Encounter  Procedures  . EKG 12-Lead     Disposition:   FU with me in 6 months.   Signed, Minus Breeding, MD  04/20/2019 12:04 PM    [place]

## 2019-04-20 ENCOUNTER — Other Ambulatory Visit: Payer: Self-pay

## 2019-04-20 ENCOUNTER — Ambulatory Visit (INDEPENDENT_AMBULATORY_CARE_PROVIDER_SITE_OTHER): Payer: Medicare Other | Admitting: Cardiology

## 2019-04-20 ENCOUNTER — Encounter: Payer: Self-pay | Admitting: Cardiology

## 2019-04-20 VITALS — BP 118/60 | HR 55 | Ht 66.0 in | Wt 158.0 lb

## 2019-04-20 DIAGNOSIS — I2510 Atherosclerotic heart disease of native coronary artery without angina pectoris: Secondary | ICD-10-CM | POA: Diagnosis not present

## 2019-04-20 DIAGNOSIS — I482 Chronic atrial fibrillation, unspecified: Secondary | ICD-10-CM

## 2019-04-20 DIAGNOSIS — I5032 Chronic diastolic (congestive) heart failure: Secondary | ICD-10-CM

## 2019-04-20 DIAGNOSIS — E785 Hyperlipidemia, unspecified: Secondary | ICD-10-CM

## 2019-04-20 DIAGNOSIS — I10 Essential (primary) hypertension: Secondary | ICD-10-CM | POA: Diagnosis not present

## 2019-04-20 DIAGNOSIS — Z7189 Other specified counseling: Secondary | ICD-10-CM

## 2019-04-20 NOTE — Patient Instructions (Signed)
Medication Instructions:  The current medical regimen is effective;  continue present plan and medications.  *If you need a refill on your cardiac medications before your next appointment, please call your pharmacy*  Follow-Up: At [facility], you and your health needs are our priority.  As part of our continuing mission to provide you with exceptional heart care, we have created designated Provider Care Teams.  These Care Teams include your primary Cardiologist (physician) and Advanced Practice Providers (APPs -  Physician Assistants and Nurse Practitioners) who all work together to provide you with the care you need, when you need it.  Your next appointment:   6 month(s)  The format for your next appointment:   In Person  Provider:   James Hochrein, MD  Thank you for choosing [facility]!!     

## 2019-06-24 ENCOUNTER — Other Ambulatory Visit: Payer: Self-pay | Admitting: Nephrology

## 2019-06-24 DIAGNOSIS — N189 Chronic kidney disease, unspecified: Secondary | ICD-10-CM

## 2019-07-01 ENCOUNTER — Other Ambulatory Visit: Payer: Self-pay

## 2019-07-01 ENCOUNTER — Ambulatory Visit (HOSPITAL_COMMUNITY)
Admission: RE | Admit: 2019-07-01 | Discharge: 2019-07-01 | Disposition: A | Discharge status: Home / Self Care | Payer: Medicare Other | Source: Ambulatory Visit | Attending: Nephrology | Admitting: Nephrology

## 2019-07-01 DIAGNOSIS — N189 Chronic kidney disease, unspecified: Secondary | ICD-10-CM | POA: Diagnosis present

## 2019-07-31 ENCOUNTER — Other Ambulatory Visit: Payer: Self-pay | Admitting: Cardiology

## 2019-08-01 NOTE — Telephone Encounter (Signed)
Rx request sent to pharmacy.  

## 2019-08-04 ENCOUNTER — Inpatient Hospital Stay (HOSPITAL_COMMUNITY): Payer: Medicare Other | Attending: Hematology | Admitting: Hematology

## 2019-08-04 ENCOUNTER — Encounter (HOSPITAL_COMMUNITY): Payer: Self-pay | Admitting: Hematology

## 2019-08-04 ENCOUNTER — Other Ambulatory Visit: Payer: Self-pay

## 2019-08-04 ENCOUNTER — Inpatient Hospital Stay (HOSPITAL_COMMUNITY): Payer: Medicare Other

## 2019-08-04 ENCOUNTER — Ambulatory Visit (HOSPITAL_COMMUNITY)
Admission: RE | Admit: 2019-08-04 | Discharge: 2019-08-04 | Disposition: A | Discharge status: Home / Self Care | Payer: Medicare Other | Source: Ambulatory Visit | Attending: Hematology | Admitting: Hematology

## 2019-08-04 VITALS — BP 92/56 | HR 80 | Temp 97.1°F | Resp 18 | Ht 66.0 in | Wt 151.0 lb

## 2019-08-04 DIAGNOSIS — Z77090 Contact with and (suspected) exposure to asbestos: Secondary | ICD-10-CM | POA: Insufficient documentation

## 2019-08-04 DIAGNOSIS — R7989 Other specified abnormal findings of blood chemistry: Secondary | ICD-10-CM | POA: Insufficient documentation

## 2019-08-04 DIAGNOSIS — D472 Monoclonal gammopathy: Secondary | ICD-10-CM | POA: Insufficient documentation

## 2019-08-04 DIAGNOSIS — Z803 Family history of malignant neoplasm of breast: Secondary | ICD-10-CM | POA: Insufficient documentation

## 2019-08-04 DIAGNOSIS — Z87891 Personal history of nicotine dependence: Secondary | ICD-10-CM | POA: Insufficient documentation

## 2019-08-04 DIAGNOSIS — N183 Chronic kidney disease, stage 3 unspecified: Secondary | ICD-10-CM | POA: Diagnosis not present

## 2019-08-04 DIAGNOSIS — R590 Localized enlarged lymph nodes: Secondary | ICD-10-CM | POA: Insufficient documentation

## 2019-08-04 DIAGNOSIS — E1122 Type 2 diabetes mellitus with diabetic chronic kidney disease: Secondary | ICD-10-CM | POA: Diagnosis not present

## 2019-08-04 LAB — CBC WITH DIFFERENTIAL/PLATELET
Abs Immature Granulocytes: 0.2 10*3/uL — ABNORMAL HIGH (ref 0.00–0.07)
Basophils Absolute: 0.1 10*3/uL (ref 0.0–0.1)
Basophils Relative: 1 %
Eosinophils Absolute: 0.3 10*3/uL (ref 0.0–0.5)
Eosinophils Relative: 3 %
HCT: 42.1 % (ref 39.0–52.0)
Hemoglobin: 13.1 g/dL (ref 13.0–17.0)
Immature Granulocytes: 2 %
Lymphocytes Relative: 23 %
Lymphs Abs: 2 10*3/uL (ref 0.7–4.0)
MCH: 29.1 pg (ref 26.0–34.0)
MCHC: 31.1 g/dL (ref 30.0–36.0)
MCV: 93.6 fL (ref 80.0–100.0)
Monocytes Absolute: 0.7 10*3/uL (ref 0.1–1.0)
Monocytes Relative: 8 %
Neutro Abs: 5.6 10*3/uL (ref 1.7–7.7)
Neutrophils Relative %: 63 %
Platelets: 586 10*3/uL — ABNORMAL HIGH (ref 150–400)
RBC: 4.5 MIL/uL (ref 4.22–5.81)
RDW: 14 % (ref 11.5–15.5)
WBC: 9 10*3/uL (ref 4.0–10.5)
nRBC: 0 % (ref 0.0–0.2)

## 2019-08-04 LAB — COMPREHENSIVE METABOLIC PANEL
ALT: 37 U/L (ref 0–44)
AST: 33 U/L (ref 15–41)
Albumin: 3.6 g/dL (ref 3.5–5.0)
Alkaline Phosphatase: 83 U/L (ref 38–126)
Anion gap: 11 (ref 5–15)
BUN: 35 mg/dL — ABNORMAL HIGH (ref 8–23)
CO2: 25 mmol/L (ref 22–32)
Calcium: 8.8 mg/dL — ABNORMAL LOW (ref 8.9–10.3)
Chloride: 98 mmol/L (ref 98–111)
Creatinine, Ser: 1.36 mg/dL — ABNORMAL HIGH (ref 0.61–1.24)
GFR calc Af Amer: 57 mL/min — ABNORMAL LOW (ref >60)
GFR calc non Af Amer: 49 mL/min — ABNORMAL LOW (ref >60)
Glucose, Bld: 274 mg/dL — ABNORMAL HIGH (ref 70–99)
Potassium: 4 mmol/L (ref 3.5–5.1)
Sodium: 134 mmol/L — ABNORMAL LOW (ref 135–145)
Total Bilirubin: 0.5 mg/dL (ref 0.3–1.2)
Total Protein: 6.9 g/dL (ref 6.5–8.1)

## 2019-08-04 LAB — LACTATE DEHYDROGENASE: LDH: 150 U/L (ref 98–192)
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
## 2019-08-04 NOTE — Patient Instructions (Signed)
[place] at [facility] Discharge Instructions  You were seen today by Dr. Delton Coombes. He went over your recent results and possible diagnoses. You will have blood tests ordered today. You will be scheduled for an X-ray of your bones. Dr. Delton Coombes will see you back in 5 weeks for labs and follow up.   Thank you for choosing [place] at [facility] to provide your oncology and hematology care.  To afford each patient quality time with our provider, please arrive at least 15 minutes before your scheduled appointment time.   If you have a lab appointment with the [facility] please come in thru the Main Entrance and check in at the main information desk  You need to re-schedule your appointment should you arrive 10 or more minutes late.  We strive to give you quality time with our providers, and arriving late affects you and other patients whose appointments are after yours.  Also, if you no show three or more times for appointments you may be dismissed from the clinic at the providers discretion.     Again, thank you for choosing [facility].  Our hope is that these requests will decrease the amount of time that you wait before being seen by our physicians.       _____________________________________________________________  Should you have questions after your visit to [facility], please contact our office at [phone] between the hours of 8:00 a.m. and 4:30 p.m.  Voicemails left after 4:00 p.m. will not be returned until the following business day.  For prescription refill requests, have your pharmacy contact our office and allow 72 hours.    Cancer Center Support Programs:   > Cancer Support Group  2nd Tuesday of the month 1pm-2pm, Journey Room

## 2019-08-05 LAB — KAPPA/LAMBDA LIGHT CHAINS
Kappa free light chain: 46.6 mg/L — ABNORMAL HIGH (ref 3.3–19.4)
Kappa, lambda light chain ratio: 2.48 — ABNORMAL HIGH (ref 0.26–1.65)
Lambda free light chains: 18.8 mg/L (ref 5.7–26.3)

## 2019-08-05 LAB — PROTEIN ELECTROPHORESIS, SERUM
A/G Ratio: 1 (ref 0.7–1.7)
Albumin ELP: 3.2 g/dL (ref 2.9–4.4)
Alpha-1-Globulin: 0.3 g/dL (ref 0.0–0.4)
Alpha-2-Globulin: 1.2 g/dL — ABNORMAL HIGH (ref 0.4–1.0)
Beta Globulin: 1.1 g/dL (ref 0.7–1.3)
Gamma Globulin: 0.5 g/dL (ref 0.4–1.8)
Globulin, Total: 3.1 g/dL (ref 2.2–3.9)
Total Protein ELP: 6.3 g/dL (ref 6.0–8.5)

## 2019-08-05 LAB — BETA 2 MICROGLOBULIN, SERUM: Beta-2 Microglobulin: 4.3 mg/L — ABNORMAL HIGH (ref 0.6–2.4)

## 2019-08-08 LAB — IMMUNOFIXATION ELECTROPHORESIS
IgA: 124 mg/dL (ref 61–437)
IgG (Immunoglobin G), Serum: 512 mg/dL — ABNORMAL LOW (ref 603–1613)
IgM (Immunoglobulin M), Srm: 35 mg/dL (ref 15–143)
Total Protein ELP: 6.2 g/dL (ref 6.0–8.5)

## 2019-09-06 ENCOUNTER — Ambulatory Visit (HOSPITAL_COMMUNITY): Payer: Medicare Other | Admitting: Hematology

## 2019-09-12 ENCOUNTER — Other Ambulatory Visit: Payer: Self-pay

## 2019-09-12 ENCOUNTER — Ambulatory Visit (HOSPITAL_COMMUNITY): Payer: Medicare Other | Admitting: Hematology

## 2019-09-12 ENCOUNTER — Inpatient Hospital Stay (HOSPITAL_COMMUNITY): Payer: Medicare Other | Attending: Hematology | Admitting: Hematology

## 2019-09-12 VITALS — BP 110/64 | HR 67 | Temp 96.0°F | Resp 18 | Wt 153.2 lb

## 2019-09-12 DIAGNOSIS — N183 Chronic kidney disease, stage 3 unspecified: Secondary | ICD-10-CM | POA: Diagnosis not present

## 2019-09-12 DIAGNOSIS — D472 Monoclonal gammopathy: Secondary | ICD-10-CM | POA: Insufficient documentation

## 2019-09-12 DIAGNOSIS — R590 Localized enlarged lymph nodes: Secondary | ICD-10-CM | POA: Diagnosis not present

## 2019-09-12 NOTE — Patient Instructions (Signed)
[facility] at [facility] Discharge Instructions  You were seen today by Dr. Katragadda. He went over your recent results and scans. Dr. Katragadda will see you back in 4 months for labs and follow up.   Thank you for choosing [facility] at [facility] to provide your oncology and hematology care.  To afford each patient quality time with our provider, please arrive at least 15 minutes before your scheduled appointment time.   If you have a lab appointment with the Cancer Center please come in thru the Main Entrance and check in at the main information desk  You need to re-schedule your appointment should you arrive 10 or more minutes late.  We strive to give you quality time with our providers, and arriving late affects you and other patients whose appointments are after yours.  Also, if you no show three or more times for appointments you may be dismissed from the clinic at the providers discretion.     Again, thank you for choosing [facility].  Our hope is that these requests will decrease the amount of time that you wait before being seen by our physicians.       _____________________________________________________________  Should you have questions after your visit to  Cancer Center, please contact our office at [phone] between the hours of 8:00 a.m. and 4:30 p.m.  Voicemails left after 4:00 p.m. will not be returned until the following business day.  For prescription refill requests, have your pharmacy contact our office and allow 72 hours.    Cancer Center Support Programs:   > Cancer Support Group  2nd Tuesday of the month 1pm-2pm, Journey Room   
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
## 2019-11-22 ENCOUNTER — Other Ambulatory Visit: Payer: Self-pay | Admitting: Student

## 2019-11-22 NOTE — Telephone Encounter (Signed)
This is a [place] pt.  °

## 2020-01-05 ENCOUNTER — Other Ambulatory Visit: Payer: Self-pay | Admitting: Cardiology

## 2020-01-06 ENCOUNTER — Telehealth: Payer: Self-pay | Admitting: *Deleted

## 2020-01-06 NOTE — Telephone Encounter (Signed)
Called to update phone number and pharmacy name. No longer uses [facility]. Now using [facility] in [place], [place].

## 2020-01-23 ENCOUNTER — Other Ambulatory Visit (HOSPITAL_COMMUNITY): Payer: Medicare Other

## 2020-01-26 ENCOUNTER — Other Ambulatory Visit: Payer: Self-pay | Admitting: Student

## 2020-01-30 ENCOUNTER — Ambulatory Visit (HOSPITAL_COMMUNITY): Payer: Medicare Other | Admitting: Hematology and Oncology

## 2020-01-30 ENCOUNTER — Ambulatory Visit (HOSPITAL_COMMUNITY): Payer: Medicare Other | Admitting: Hematology

## 2020-02-02 LAB — BASIC METABOLIC PANEL
BUN: 38 — AB (ref 4–21)
Chloride: 94 — AB (ref 99–108)
Creatinine: 1.5 — AB (ref 0.6–1.3)
Glucose: 371

## 2020-02-02 LAB — HEMOGLOBIN A1C: Hemoglobin A1C: 9.5

## 2020-02-02 LAB — COMPREHENSIVE METABOLIC PANEL: GFR calc non Af Amer: 45

## 2020-03-07 ENCOUNTER — Ambulatory Visit: Payer: Medicare Other | Admitting: Nurse Practitioner

## 2020-03-07 NOTE — Patient Instructions (Incomplete)
Diabetes Mellitus and Nutrition, Adult When you have diabetes, or diabetes mellitus, it is very important to have healthy eating habits because your blood sugar (glucose) levels are greatly affected by what you eat and drink. Eating healthy foods in the right amounts, at about the same times every day, can help you:  Control your blood glucose.  Lower your risk of heart disease.  Improve your blood pressure.  Reach or maintain a healthy weight. What can affect my meal plan? Every person with diabetes is different, and each person has different needs for a meal plan. Your health care provider may recommend that you work with a dietitian to make a meal plan that is best for you. Your meal plan may vary depending on factors such as:  The calories you need.  The medicines you take.  Your weight.  Your blood glucose, blood pressure, and cholesterol levels.  Your activity level.  Other health conditions you have, such as heart or kidney disease. How do carbohydrates affect me? Carbohydrates, also called carbs, affect your blood glucose level more than any other type of food. Eating carbs naturally raises the amount of glucose in your blood. Carb counting is a method for keeping track of how many carbs you eat. Counting carbs is important to keep your blood glucose at a healthy level, especially if you use insulin or take certain oral diabetes medicines. It is important to know how many carbs you can safely have in each meal. This is different for every person. Your dietitian can help you calculate how many carbs you should have at each meal and for each snack. How does alcohol affect me? Alcohol can cause a sudden decrease in blood glucose (hypoglycemia), especially if you use insulin or take certain oral diabetes medicines. Hypoglycemia can be a life-threatening condition. Symptoms of hypoglycemia, such as sleepiness, dizziness, and confusion, are similar to symptoms of having too much  alcohol.  Do not drink alcohol if: ? Your health care provider tells you not to drink. ? You are pregnant, may be pregnant, or are planning to become pregnant.  If you drink alcohol: ? Do not drink on an empty stomach. ? Limit how much you use to:  0-1 drink a day for women.  0-2 drinks a day for men. ? Be aware of how much alcohol is in your drink. In the [place] one drink equals one 12 oz bottle of beer (355 mL), one 5 oz glass of wine (148 mL), or one 1 oz glass of hard liquor (44 mL). ? Keep yourself hydrated with water, diet soda, or unsweetened iced tea.  Keep in mind that regular soda, juice, and other mixers may contain a lot of sugar and must be counted as carbs. What are tips for following this plan? Reading food labels  Start by checking the serving size on the "Nutrition Facts" label of packaged foods and drinks. The amount of calories, carbs, fats, and other nutrients listed on the label is based on one serving of the item. Many items contain more than one serving per package.  Check the total grams (g) of carbs in one serving. You can calculate the number of servings of carbs in one serving by dividing the total carbs by 15. For example, if a food has 30 g of total carbs per serving, it would be equal to 2 servings of carbs.  Check the number of grams (g) of saturated fats and trans fats in one serving. Choose foods that have   a low amount or none of these fats.  Check the number of milligrams (mg) of salt (sodium) in one serving. Most people should limit total sodium intake to less than 2,300 mg per day.  Always check the nutrition information of foods labeled as "low-fat" or "nonfat." These foods may be higher in added sugar or refined carbs and should be avoided.  Talk to your dietitian to identify your daily goals for nutrients listed on the label. Shopping  Avoid buying canned, pre-made, or processed foods. These foods tend to be high in fat, sodium, and added  sugar.  Shop around the outside edge of the grocery store. This is where you will most often find fresh fruits and vegetables, bulk grains, fresh meats, and fresh dairy. Cooking  Use low-heat cooking methods, such as baking, instead of high-heat cooking methods like deep frying.  Cook using healthy oils, such as olive, canola, or sunflower oil.  Avoid cooking with butter, cream, or high-fat meats. Meal planning  Eat meals and snacks regularly, preferably at the same times every day. Avoid going long periods of time without eating.  Eat foods that are high in fiber, such as fresh fruits, vegetables, beans, and whole grains. Talk with your dietitian about how many servings of carbs you can eat at each meal.  Eat 4-6 oz (112-168 g) of lean protein each day, such as lean meat, chicken, fish, eggs, or tofu. One ounce (oz) of lean protein is equal to: ? 1 oz (28 g) of meat, chicken, or fish. ? 1 egg. ?  cup (62 g) of tofu.  Eat some foods each day that contain healthy fats, such as avocado, nuts, seeds, and fish.   What foods should I eat? Fruits Berries. Apples. Oranges. Peaches. Apricots. Plums. Grapes. Mango. Papaya. Pomegranate. Kiwi. Cherries. Vegetables Lettuce. Spinach. Leafy greens, including kale, chard, collard greens, and mustard greens. Beets. Cauliflower. Cabbage. Broccoli. Carrots. Green beans. Tomatoes. Peppers. Onions. Cucumbers. Brussels sprouts. Grains Whole grains, such as whole-wheat or whole-grain bread, crackers, tortillas, cereal, and pasta. Unsweetened oatmeal. Quinoa. Brown or wild rice. Meats and other proteins Seafood. Poultry without skin. Lean cuts of poultry and beef. Tofu. Nuts. Seeds. Dairy Low-fat or fat-free dairy products such as milk, yogurt, and cheese. The items listed above may not be a complete list of foods and beverages you can eat. Contact a dietitian for more information. What foods should I avoid? Fruits Fruits canned with  syrup. Vegetables Canned vegetables. Frozen vegetables with butter or cream sauce. Grains Refined white flour and flour products such as bread, pasta, snack foods, and cereals. Avoid all processed foods. Meats and other proteins Fatty cuts of meat. Poultry with skin. Breaded or fried meats. Processed meat. Avoid saturated fats. Dairy Full-fat yogurt, cheese, or milk. Beverages Sweetened drinks, such as soda or iced tea. The items listed above may not be a complete list of foods and beverages you should avoid. Contact a dietitian for more information. Questions to ask a health care provider  Do I need to meet with a diabetes educator?  Do I need to meet with a dietitian?  What number can I call if I have questions?  When are the best times to check my blood glucose? Where to find more information:  American Diabetes Association: diabetes.org  Academy of Nutrition and Dietetics: www.eatright.org  National Institute of Diabetes and Digestive and Kidney Diseases: www.niddk.nih.gov  Association of Diabetes Care and Education Specialists: www.diabeteseducator.org Summary  It is important to have healthy eating   habits because your blood sugar (glucose) levels are greatly affected by what you eat and drink.  A healthy meal plan will help you control your blood glucose and maintain a healthy lifestyle.  Your health care provider may recommend that you work with a dietitian to make a meal plan that is best for you.  Keep in mind that carbohydrates (carbs) and alcohol have immediate effects on your blood glucose levels. It is important to count carbs and to use alcohol carefully. This information is not intended to replace advice given to you by your health care provider. Make sure you discuss any questions you have with your health care provider. Document Revised: 01/18/2019 Document Reviewed: 01/18/2019 Elsevier Patient Education  2021 Elsevier Inc.  

## 2020-03-13 NOTE — Progress Notes (Signed)
Cardiology Office Note   Date:  03/14/2020   ID:  Oscar [name], DOB 1940-11-27, MRN [id number]  PCP:  Adaline Sill, NP  Cardiologist:   Minus Breeding, MD   Chief Complaint  Patient presents with  . Coronary Artery Disease      History of Present Illness: Oscar [name] is a 80 y.o. male who presents for follow up of cardiomyopathy.  He was in the ED in May and an EF of 30% which was lower than previous.  A cath was performed by Dr. Haroldine Laws on 07/29/2018 and showed 100% Proximal-RCA stenosis, 99% Proximal-LAD, 75% Ost LM, 70% LCx, 40% PDA, and 30% Ost LCx. Cardiac MRI was performed to assess viability and showed a mixed picture with the mid anteroseptal/inferoseptal and the apical inferior and true apex wall segments unlikely to improve with revascularization however the other affected wall segments appeared viable. CT surgery was consulted but the patient declined CABG. Options regarding high risk PCI with atherectomy and Impella support were reviewed but the patient declined this as well and wished to pursue medical management. Also refused CEA during admission .  He presented back to the hospital in June with AMS.  He eventually did have CEA.  I did review his films with Dr. Burt Knack and we agreed that PCI would be high risk and difficult for procedural success.  He did have an echocardiogram which demonstrated that his EF was up a little bit from 25 to 30% and perhaps mildly increased to 35 to 40%.  He did have some moderately elevated pulmonary pressures.   Is having well since I last saw him.  It does not sound like particularly active.  He does go grocery shopping.  Grocery.  He reports that he does not have any acute symptoms with this.  He does not have any shortness of breath, PND or orthopnea.  He has no new palpitations, presyncope or syncope.  He has had no weight gain or edema.   Past Medical History:  Diagnosis Date  . Anemia   . Atrial fibrillation ([place])     permanent  . CAD (coronary artery disease)    a. cath on 07/29/2018 showed 100% Proximal-RCA stenosis, 99% Proximal-LAD, 75% Ost LM, 70% LCx, 40% PDA, and 30% Ost LCx. Declined CABG  . Cardiomyopathy    EF 35% in the past--Improved to 55% echo 2/10. Moderately severe mitral regurgitation in the past, now improved,  . Carotid artery stenosis   . CHF (congestive heart failure) ([employer]) 2002  . COPD (chronic obstructive pulmonary disease) ([place])   . Coronary artery disease    non obstructive  . Diabetes mellitus   . Dyslipidemia   . History of tobacco abuse   . Nephrolithiasis   . Tremors of nervous system     Past Surgical History:  Procedure Laterality Date  . CAROTID ENDARTERECTOMY Left 10-21-05   cea  . CATARACT EXTRACTION W/PHACO Left 09/10/2018   Procedure: CATARACT EXTRACTION PHACO AND INTRAOCULAR LENS PLACEMENT LEFT EYE;  Surgeon: Baruch Goldmann, MD;  Location: [facility];  Service: Ophthalmology;  Laterality: Left;  left  . CATARACT EXTRACTION W/PHACO Right 11/12/2018   Procedure: CATARACT EXTRACTION PHACO AND INTRAOCULAR LENS PLACEMENT RIGHT EYE;  Surgeon: Baruch Goldmann, MD;  Location: [facility];  Service: Ophthalmology;  Laterality: Right;  right  . ENDARTERECTOMY Right 08/11/2018   Procedure: ENDARTERECTOMY CAROTID RIGHT;  Surgeon: [name], MD;  Location: [facility];  Service: Vascular;  Laterality: Right;  .  left carotid endarterectomy    . PATCH ANGIOPLASTY Right 08/11/2018   Procedure: PATCH ANGIOPLAST USING HEMASHIELD PLATINUM FINESSE;  Surgeon: [name], MD;  Location: [facility];  Service: Vascular;  Laterality: Right;  . RIGHT/LEFT HEART CATH AND CORONARY ANGIOGRAPHY N/A 07/29/2018   Procedure: RIGHT/LEFT HEART CATH AND CORONARY ANGIOGRAPHY;  Surgeon: Jolaine Artist, MD;  Location: [facility];  Service: Cardiovascular;  Laterality: N/A;  . TONSILLECTOMY       Current Outpatient Medications  Medication Sig Dispense Refill  . acetaminophen (TYLENOL) 325 MG tablet Take 2  tablets (650 mg total) by mouth every 6 (six) hours as needed for mild pain (or Fever >/= 101). 30 tablet 0  . albuterol (PROAIR HFA) 108 (90 Base) MCG/ACT inhaler Inhale 2 puffs into the lungs every 6 (six) hours as needed for wheezing or shortness of breath.    Marland Kitchen aspirin EC 81 MG EC tablet Take 1 tablet (81 mg total) by mouth daily. 30 tablet 0  . Cholecalciferol 25 MCG (1000 UT) capsule Take 1,000 Units by mouth daily.     Marland Kitchen ENTRESTO 24-26 MG TAKE 1 TABLET TWICE A DAY 180 tablet 3  . ezetimibe (ZETIA) 10 MG tablet Take 10 mg by mouth daily.    . fenofibrate 160 MG tablet Take 160 mg by mouth daily.    Marland Kitchen ipratropium (ATROVENT) 0.02 % nebulizer solution Take 2.5 mLs (0.5 mg dose) by nebulization 2 (two) times daily.    Marland Kitchen JARDIANCE 10 MG TABS tablet Take 10 mg by mouth daily.    Marland Kitchen linagliptin (TRADJENTA) 5 MG TABS tablet Take 5 mg by mouth daily.     . Multiple Vitamin (MULTIVITAMIN) tablet Take 1 tablet by mouth daily.    . Omega-3 Fatty Acids (FISH OIL) 1200 MG CAPS Take 1,000 mg by mouth daily.     . pantoprazole (PROTONIX) 40 MG tablet Take 1 tablet (40 mg total) by mouth daily. 30 tablet 0  . pravastatin (PRAVACHOL) 40 MG tablet Take 80 mg by mouth daily.     Marland Kitchen warfarin (COUMADIN) 7.5 MG tablet Take 1 tablet (7.5 mg total) by mouth daily.    . carvedilol (COREG) 12.5 MG tablet Take 1 tablet (12.5 mg total) by mouth 2 (two) times daily. 180 tablet 3  . digoxin (LANOXIN) 0.125 MG tablet Take 1 tablet (125 mcg total) by mouth daily. 90 tablet 3  . furosemide (LASIX) 40 MG tablet TAKE 1 TABLET DAILY (MAINTENANCE DOSE IS NOW 40 MG DAILY) 90 tablet 3   No current facility-administered medications for this visit.    Allergies:   Penicillins, Statins, and Spironolactone    ROS:  Please see the history of present illness.   Otherwise, review of systems are positive for none.   All other systems are reviewed and negative.    PHYSICAL EXAM: VS:  BP 124/60   Pulse 83   Ht 5\' 6"  (1.676 m)    Wt 151 lb 6.4 oz (68.7 kg)   SpO2 99%   BMI 24.44 kg/m  , BMI Body mass index is 24.44 kg/m.  GENERAL:  Well appearing NECK:  No jugular venous distention, waveform within normal limits, carotid upstroke brisk and symmetric, no bruits, no thyromegaly LUNGS:  Clear to auscultation bilaterally CHEST:  Unremarkable HEART:  PMI not displaced or sustained,S1 and S2 within normal limits, no S3, no S4, no clicks, no rubs, 2 out of 6 apical systolic murmur radiating slightly at the aortic outflow tract, no  diastolic murmurs, irregular  ABD:  Flat, positive bowel sounds normal in frequency in pitch, no bruits, no rebound, no guarding, no midline pulsatile mass, no hepatomegaly, no splenomegaly EXT:  2 plus pulses throughout, no edema, no cyanosis no clubbing   EKG:  EKG  ordered  Atrial fibrillation, rate 83, axis within normal limits, intervals within normal limit.  There is some inferolateral ST depression that was present on previous EKGs that was slightly more prominent  Recent Labs: 08/04/2019: ALT 37; BUN 35; Creatinine, Ser 1.36; Hemoglobin 13.1; Platelets 586; Potassium 4.0; Sodium 134    Lipid Panel    Component Value Date/Time   CHOL 95 07/25/2018 0443   TRIG 113 07/25/2018 0443   HDL 17 (L) 07/25/2018 0443   CHOLHDL 5.6 07/25/2018 0443   VLDL 23 07/25/2018 0443   LDLCALC 55 07/25/2018 0443      Wt Readings from Last 3 Encounters:  03/14/20 151 lb 6.4 oz (68.7 kg)  09/12/19 153 lb 3.2 oz (69.5 kg)  08/04/19 151 lb (68.5 kg)      Other studies Reviewed: Additional studies/ records that were reviewed today include:   Labs, old EKGs Review of the above records demonstrates: See elsewhere   ASSESSMENT AND PLAN:  CAD:    Patient has no new symptoms.  Given medicine despite the EKG changes with the discussion cited above I do not think that there is an indication for further testing or revascularization.    CHRONIC COMBINED SYSTOLIC AND DIASTOLIC HF:   His ejection fraction  had improved slightly.  He has no symptoms.  Continue with meds as listed.  I do not think at this point he would tolerate med titration low blood pressures in the past.  If his blood pressure remains up over the next 6 months we could consider going up on the dose of Entresto.  ATRIAL FIB:   He tolerates atrial fibrillation reasonable rate control.  He is not listed from warfarin.  No change in therapy.   HTN:   This is managed in the context of acute heart failure.   HYPERLIPIDEMIA:    LDL was 55 with an HDL of 17.  No change in therapy.  I would like to have a lipid profile and I put an order in for this as this has not been checked in about a year and a half.  DM: He said his A1c was 6.9 which is down from 8.9.  He did very well when he was started [place].    COVID EDUCATION:   He has had all of his vaccines.  Current medicines are reviewed at length with the patient today.  The patient does not have concerns regarding medicines.  The following changes have been made: None  Labs/ tests ordered today include:    Orders Placed This Encounter  Procedures  . EKG 12-Lead     Disposition:   FU with me in 12 months months.   I would like to see APP at any point in 6 months  Signed, Minus Breeding, MD  03/14/2020 2:01 PM    [facility]

## 2020-03-14 ENCOUNTER — Encounter: Payer: Self-pay | Admitting: Cardiology

## 2020-03-14 ENCOUNTER — Ambulatory Visit (INDEPENDENT_AMBULATORY_CARE_PROVIDER_SITE_OTHER): Payer: Medicare Other | Admitting: Cardiology

## 2020-03-14 ENCOUNTER — Other Ambulatory Visit: Payer: Self-pay

## 2020-03-14 VITALS — BP 124/60 | HR 83 | Ht 66.0 in | Wt 151.4 lb

## 2020-03-14 DIAGNOSIS — I10 Essential (primary) hypertension: Secondary | ICD-10-CM

## 2020-03-14 DIAGNOSIS — I482 Chronic atrial fibrillation, unspecified: Secondary | ICD-10-CM

## 2020-03-14 DIAGNOSIS — E785 Hyperlipidemia, unspecified: Secondary | ICD-10-CM

## 2020-03-14 DIAGNOSIS — I5042 Chronic combined systolic (congestive) and diastolic (congestive) heart failure: Secondary | ICD-10-CM

## 2020-03-14 DIAGNOSIS — E118 Type 2 diabetes mellitus with unspecified complications: Secondary | ICD-10-CM

## 2020-03-14 MED ORDER — FUROSEMIDE 40 MG PO TABS
ORAL_TABLET | ORAL | 3 refills | Status: DC
Start: 2020-03-14 — End: 2021-03-18

## 2020-03-14 MED ORDER — CARVEDILOL 12.5 MG PO TABS
12.5000 mg | ORAL_TABLET | Freq: Two times a day (BID) | ORAL | 3 refills | Status: DC
Start: 2020-03-14 — End: 2021-03-01

## 2020-03-14 MED ORDER — DIGOXIN 125 MCG PO TABS: 125.0000 ug | ORAL_TABLET | Freq: Every day | ORAL | 3 refills | Status: DC

## 2020-03-14 NOTE — Patient Instructions (Signed)
Medication Instructions:  The current medical regimen is effective;  continue present plan and medications.  *If you need a refill on your cardiac medications before your next appointment, please call your pharmacy*  Lab Work: Please have your cholesterol check at your primary care Dr's office and have the results faxed to Dr Percival Spanish.  [phone].  If you have labs (blood work) drawn today and your tests are completely normal, you will receive your results only by: Marland Kitchen MyChart Message (if you have MyChart) OR . A paper copy in the mail If you have any lab test that is abnormal or we need to change your treatment, we will call you to review the results.  Follow-Up: At [facility], you and your health needs are our priority.  As part of our continuing mission to provide you with exceptional heart care, we have created designated Provider Care Teams.  These Care Teams include your primary Cardiologist (physician) and Advanced Practice Providers (APPs -  Physician Assistants and Nurse Practitioners) who all work together to provide you with the care you need, when you need it.  We recommend signing up for the patient portal called "MyChart".  Sign up information is provided on this After Visit Summary.  MyChart is used to connect with patients for Virtual Visits (Telemedicine).  Patients are able to view lab/test results, encounter notes, upcoming appointments, etc.  Non-urgent messages can be sent to your provider as well.   To learn more about what you can do with MyChart, go to [email or web address].    Your next appointment:   6 month(s)  The format for your next appointment:   In Person  Provider:   Bernerd Pho, PA-C   Thank you for choosing [facility]!!

## 2020-03-15 ENCOUNTER — Ambulatory Visit (INDEPENDENT_AMBULATORY_CARE_PROVIDER_SITE_OTHER): Payer: Medicare Other | Admitting: Nurse Practitioner

## 2020-03-15 ENCOUNTER — Encounter: Payer: Self-pay | Admitting: Nurse Practitioner

## 2020-03-15 VITALS — BP 101/63 | HR 91 | Ht 66.0 in | Wt 152.0 lb

## 2020-03-15 DIAGNOSIS — I10 Essential (primary) hypertension: Secondary | ICD-10-CM

## 2020-03-15 DIAGNOSIS — E785 Hyperlipidemia, unspecified: Secondary | ICD-10-CM

## 2020-03-15 DIAGNOSIS — N1831 Chronic kidney disease, stage 3a: Secondary | ICD-10-CM | POA: Diagnosis not present

## 2020-03-15 DIAGNOSIS — E1122 Type 2 diabetes mellitus with diabetic chronic kidney disease: Secondary | ICD-10-CM | POA: Diagnosis not present

## 2020-03-15 MED ORDER — GLIPIZIDE ER 10 MG PO TB24: 10.0000 mg | ORAL_TABLET | Freq: Every day | ORAL | 3 refills | Status: DC

## 2020-03-15 MED ORDER — LINAGLIPTIN 5 MG PO TABS: 5.0000 mg | ORAL_TABLET | Freq: Every day | ORAL | 3 refills | Status: DC

## 2020-03-15 NOTE — Progress Notes (Signed)
Endocrinology Consult Note       03/15/2020, 4:09 PM   Subjective:    Patient ID: Oscar [name], male    DOB: 1940/09/08.  Oscar [name] is being seen in consultation for management of currently uncontrolled symptomatic diabetes requested by  Adaline Sill, NP.   Past Medical History:  Diagnosis Date  . Anemia   . Atrial fibrillation ([place])    permanent  . CAD (coronary artery disease)    a. cath on 07/29/2018 showed 100% Proximal-RCA stenosis, 99% Proximal-LAD, 75% Ost LM, 70% LCx, 40% PDA, and 30% Ost LCx. Declined CABG  . Cardiomyopathy    EF 35% in the past--Improved to 55% echo 2/10. Moderately severe mitral regurgitation in the past, now improved,  . Carotid artery stenosis   . CHF (congestive heart failure) ([place]) 2002  . COPD (chronic obstructive pulmonary disease) ([place])   . Coronary artery disease    non obstructive  . Diabetes mellitus   . Dyslipidemia   . History of tobacco abuse   . Nephrolithiasis   . Tremors of nervous system     Past Surgical History:  Procedure Laterality Date  . CAROTID ENDARTERECTOMY Left 10-21-05   cea  . CATARACT EXTRACTION W/PHACO Left 09/10/2018   Procedure: CATARACT EXTRACTION PHACO AND INTRAOCULAR LENS PLACEMENT LEFT EYE;  Surgeon: Baruch Goldmann, MD;  Location: [facility];  Service: Ophthalmology;  Laterality: Left;  left  . CATARACT EXTRACTION W/PHACO Right 11/12/2018   Procedure: CATARACT EXTRACTION PHACO AND INTRAOCULAR LENS PLACEMENT RIGHT EYE;  Surgeon: Baruch Goldmann, MD;  Location: [facility];  Service: Ophthalmology;  Laterality: Right;  right  . ENDARTERECTOMY Right 08/11/2018   Procedure: ENDARTERECTOMY CAROTID RIGHT;  Surgeon: [name], MD;  Location: [facility];  Service: Vascular;  Laterality: Right;  . left carotid endarterectomy    . PATCH ANGIOPLASTY Right 08/11/2018   Procedure: PATCH ANGIOPLAST USING HEMASHIELD PLATINUM FINESSE;  Surgeon: [name], MD;  Location: [place] [facility];  Service: Vascular;  Laterality: Right;  . RIGHT/LEFT HEART CATH AND CORONARY ANGIOGRAPHY N/A 07/29/2018   Procedure: RIGHT/LEFT HEART CATH AND CORONARY ANGIOGRAPHY;  Surgeon: Jolaine Artist, MD;  Location: [place] [facility];  Service: Cardiovascular;  Laterality: N/A;  . TONSILLECTOMY      Social History   Socioeconomic History  . Marital status: Married    Spouse name: Not on file  . Number of children: 5  . Years of education: 64  . Highest education level: Not on file  Occupational History  . Occupation: Retired  Tobacco Use  . Smoking status: Former Smoker    Types: Cigarettes    Quit date: 02/25/2000    Years since quitting: 20.0  . Smokeless tobacco: Former Network engineer  . Vaping Use: Never used  Substance and Sexual Activity  . Alcohol use: No  . Drug use: No  . Sexual activity: Not on file  Other Topics Concern  . Not on file  Social History Narrative   Lives at home with his wife.   Right-handed.   3-4 cups caffeine per day.   Social Determinants of Health   Financial Resource Strain: Low Risk   .  Difficulty of Paying Living Expenses: Not hard at all  Food Insecurity: No Food Insecurity  . Worried About Charity fundraiser in the Last Year: Never true  . Ran Out of Food in the Last Year: Never true  Transportation Needs: No Transportation Needs  . Lack of Transportation (Medical): No  . Lack of Transportation (Non-Medical): No  Physical Activity: Insufficiently Active  . Days of Exercise per Week: 2 days  . Minutes of Exercise per Session: 20 min  Stress: No Stress Concern Present  . Feeling of Stress : Not at all  Social Connections: Moderately Integrated  . Frequency of Communication with Friends and Family: Twice a week  . Frequency of Social Gatherings with Friends and Family: Twice a week  . Attends Religious Services: 1 to 4 times per year  . Active Member of Clubs or Organizations: No  . Attends Theatre manager Meetings: Never  . Marital Status: Married    Family History  Problem Relation Age of Onset  . Coronary artery disease Mother   . Diabetes Mother   . Heart disease Mother        Before age 95 and  CHF  . Hypertension Mother   . Other Mother        varicose veins  . Heart attack Mother   . Heart disease Sister   . Hypertension Sister   . Heart attack Sister   . Heart disease Brother   . Hyperlipidemia Brother   . Hypertension Brother   . Other Brother        varicose veins  . Heart attack Brother   . Heart disease Son 75       Heart Disease before age 24- Open hear surgery  . Deep vein thrombosis Son   . Heart attack Son   . Coronary artery disease Other   . Diabetes Daughter   . Hypertension Daughter   . Other Daughter        varicose veins    Outpatient Encounter Medications as of 03/15/2020  Medication Sig  . acetaminophen (TYLENOL) 325 MG tablet Take 2 tablets (650 mg total) by mouth every 6 (six) hours as needed for mild pain (or Fever >/= 101).  Marland Kitchen albuterol (PROAIR HFA) 108 (90 Base) MCG/ACT inhaler Inhale 2 puffs into the lungs every 6 (six) hours as needed for wheezing or shortness of breath.  Marland Kitchen aspirin EC 81 MG EC tablet Take 1 tablet (81 mg total) by mouth daily.  . carvedilol (COREG) 12.5 MG tablet Take 1 tablet (12.5 mg total) by mouth 2 (two) times daily.  . Cholecalciferol 25 MCG (1000 UT) capsule Take 1,000 Units by mouth daily.   . digoxin (LANOXIN) 0.125 MG tablet Take 1 tablet (125 mcg total) by mouth daily.  Marland Kitchen ENTRESTO 24-26 MG TAKE 1 TABLET TWICE A DAY  . ezetimibe (ZETIA) 10 MG tablet Take 10 mg by mouth daily.  . fenofibrate 160 MG tablet Take 160 mg by mouth daily.  . furosemide (LASIX) 40 MG tablet TAKE 1 TABLET DAILY (MAINTENANCE DOSE IS NOW 40 MG DAILY)  . glipiZIDE (GLIPIZIDE XL) 10 MG 24 hr tablet Take 1 tablet (10 mg total) by mouth daily with breakfast.  . ipratropium (ATROVENT) 0.02 % nebulizer solution Take 2.5 mLs (0.5 mg  dose) by nebulization 2 (two) times daily.  . Multiple Vitamin (MULTIVITAMIN) tablet Take 1 tablet by mouth daily.  . Omega-3 Fatty Acids (FISH OIL) 1200 MG CAPS Take 1,000 mg by  mouth daily.   . pantoprazole (PROTONIX) 40 MG tablet Take 1 tablet (40 mg total) by mouth daily.  . pravastatin (PRAVACHOL) 40 MG tablet Take 80 mg by mouth daily.   Marland Kitchen warfarin (COUMADIN) 7.5 MG tablet Take 1 tablet (7.5 mg total) by mouth daily.  . [DISCONTINUED] JARDIANCE 10 MG TABS tablet Take 10 mg by mouth daily.  . [DISCONTINUED] linagliptin (TRADJENTA) 5 MG TABS tablet Take 5 mg by mouth daily.   . [DISCONTINUED] repaglinide (PRANDIN) 2 MG tablet Take 2 mg by mouth 3 (three) times daily before meals.  Marland Kitchen linagliptin (TRADJENTA) 5 MG TABS tablet Take 1 tablet (5 mg total) by mouth daily.  . [DISCONTINUED] spironolactone (ALDACTONE) 25 MG tablet Take 1 tablet (25 mg total) by mouth daily.   No facility-administered encounter medications on file as of 03/15/2020.    ALLERGIES: Allergies  Allergen Reactions  . Penicillins Hives and Other (See Comments)    Blisters on hands  . Statins Other (See Comments)    Joint and muscle pain and low energy.  Marland Kitchen Spironolactone     Hyperkalemia    VACCINATION STATUS:  There is no immunization history on file for this patient.  Diabetes He presents for his initial diabetic visit. He has type 2 diabetes mellitus. Onset time: Diagnosed at approx age of 54. His disease course has been fluctuating. There are no hypoglycemic associated symptoms. Associated symptoms include fatigue. Pertinent negatives for diabetes include no blurred vision and no foot paresthesias. There are no hypoglycemic complications. Symptoms are stable. Diabetic complications include a CVA, heart disease and nephropathy. Risk factors for coronary artery disease include diabetes mellitus, dyslipidemia, hypertension, male sex and sedentary lifestyle. Current diabetic treatment includes oral agent (triple  therapy). He is compliant with treatment most of the time. His weight is stable. He is following a generally unhealthy diet. When asked about meal planning, he reported none. He has not had a previous visit with a dietitian. He rarely participates in exercise. (He presents for his consultation with no meter or logs to review.  His most recent A1c was 9.5% on 02/02/20.  He does not routinely monitor glucose but has plans to start today based on recommendations from his PCP.  He admits to the consumption of sugary beverages such as diet pepsi, sometimes skips meals, and consumes some snacks.  He does not engage in routine physical activity.  He was taken off of Metformin due to worsening renal failure.  His PCP had put him on Jardiance.) An ACE inhibitor/angiotensin II receptor blocker is contraindicated. He does not see a podiatrist.Eye exam is current.  Hypertension This is a chronic problem. The current episode started more than 1 year ago. The problem has been resolved since onset. The problem is controlled. Pertinent negatives include no blurred vision. There are no associated agents to hypertension. Risk factors for coronary artery disease include diabetes mellitus, dyslipidemia, male gender and sedentary lifestyle. Past treatments include beta blockers and diuretics. The current treatment provides moderate improvement. There are no compliance problems.  Hypertensive end-organ damage includes kidney disease, CAD/MI, CVA and heart failure. Identifiable causes of hypertension include chronic renal disease and renovascular disease.  Hyperlipidemia This is a chronic problem. The current episode started more than 1 year ago. The problem is controlled. Recent lipid tests were reviewed and are normal. Exacerbating diseases include chronic renal disease, diabetes and nephrotic syndrome. Factors aggravating his hyperlipidemia include beta blockers and fatty foods. Current antihyperlipidemic treatment includes statins.  There are no  compliance problems.  Risk factors for coronary artery disease include diabetes mellitus, dyslipidemia, hypertension, male sex and a sedentary lifestyle.     Review of systems  Constitutional: + Minimally fluctuating body weight,  current Body mass index is 24.53 kg/m. , + fatigue, no subjective hyperthermia, no subjective hypothermia Eyes: no blurry vision, no xerophthalmia ENT: no sore throat, no nodules palpated in throat, no dysphagia/odynophagia, no hoarseness Cardiovascular: no chest pain, no shortness of breath, no palpitations, no leg swelling Respiratory: no cough, no shortness of breath Gastrointestinal: no nausea/vomiting/diarrhea Musculoskeletal: no muscle/joint aches Skin: no rashes, no hyperemia Neurological: no tremors, no numbness, no tingling, no dizziness Psychiatric: no depression, no anxiety  Objective:     BP 101/63 (BP Location: Right Arm, Patient Position: Sitting)   Pulse 91   Ht 5\' 6"  (1.676 m)   Wt 152 lb (68.9 kg)   BMI 24.53 kg/m   Wt Readings from Last 3 Encounters:  03/15/20 152 lb (68.9 kg)  03/14/20 151 lb 6.4 oz (68.7 kg)  09/12/19 153 lb 3.2 oz (69.5 kg)    BP Readings from Last 3 Encounters:  03/15/20 101/63  03/14/20 124/60  09/12/19 110/64    Physical Exam- Limited  Constitutional:  Body mass index is 24.53 kg/m. , not in acute distress, normal state of mind Eyes:  EOMI, no exophthalmos Neck: Supple Thyroid: No gross goiter Cardiovascular: RRR, no murmers, rubs, or gallops, no edema Respiratory: Adequate breathing efforts, no crackles, rales, rhonchi, or wheezing Musculoskeletal: no gross deformities, strength intact in all four extremities, no gross restriction of joint movements Skin:  no rashes, no hyperemia Neurological: no tremor with outstretched hands    CMP ( most recent) CMP     Component Value Date/Time   NA 134 (L) 08/04/2019 1449   K 4.0 08/04/2019 1449   CL 94 (A) 02/02/2020 0000   CO2 25  08/04/2019 1449   GLUCOSE 274 (H) 08/04/2019 1449   BUN 38 (A) 02/02/2020 0000   CREATININE 1.5 (A) 02/02/2020 0000   CREATININE 1.36 (H) 08/04/2019 1449   CALCIUM 8.8 (L) 08/04/2019 1449   PROT 6.9 08/04/2019 1449   ALBUMIN 3.6 08/04/2019 1449   AST 33 08/04/2019 1449   ALT 37 08/04/2019 1449   ALKPHOS 83 08/04/2019 1449   BILITOT 0.5 08/04/2019 1449   GFRNONAA 45 02/02/2020 0000   GFRAA 57 (L) 08/04/2019 1449     Diabetic Labs (most recent): Lab Results  Component Value Date   HGBA1C 9.5 02/02/2020   HGBA1C 6.9 (H) 07/25/2018     Lipid Panel ( most recent) Lipid Panel     Component Value Date/Time   CHOL 95 07/25/2018 0443   TRIG 113 07/25/2018 0443   HDL 17 (L) 07/25/2018 0443   CHOLHDL 5.6 07/25/2018 0443   VLDL 23 07/25/2018 0443   LDLCALC 55 07/25/2018 0443      Lab Results  Component Value Date   TSH 0.997 06/16/2014           Assessment & Plan:   1) Type 2 Diabetes with CKD stage 3a  - Howell Pringle [place] has currently uncontrolled symptomatic type 2 DM since 80 years of age, with most recent A1c of 9.5 %.   He presents for his consultation with no meter or logs to review.  His most recent A1c was 9.5% on 02/02/20.  He does not routinely monitor glucose but has plans to start today based on recommendations from his PCP.  He admits to the  consumption of sugary beverages such as diet pepsi, sometimes skips meals, and consumes some snacks.  He does not engage in routine physical activity.  He was taken off of Metformin due to worsening renal failure.  His PCP had put him on Jardiance.  He denies any s/s of hypoglycemia.  -Recent labs reviewed.  - I had a long discussion with him about the progressive nature of diabetes and the pathology behind its complications. -his diabetes is complicated by TIA, MI, CHF, CAD, CKD and he remains at a high risk for more acute and chronic complications which include retinopathy, and neuropathy. These are all discussed in  detail with him.  - I have counseled him on diet  and weight management  by adopting a carbohydrate restricted/protein rich diet. Patient is encouraged to switch to unprocessed or minimally processed complex starch and increased protein intake (animal or plant source), fruits, and vegetables. -  he is advised to stick to a routine mealtimes to eat 3 meals a day and avoid unnecessary snacks (to snack only to correct hypoglycemia).   - he acknowledges that there is a room for improvement in his food and drink choices. - Suggestion is made for him to avoid simple carbohydrates  from his diet including Cakes, Sweet Desserts, Ice Cream, Soda (diet and regular), Sweet Tea, Candies, Chips, Cookies, Store Bought Juices, Alcohol in Excess of  1-2 drinks a day, Artificial Sweeteners, Coffee Creamer, and "Sugar-free" Products. This will help patient to have more stable blood glucose profile and potentially avoid unintended weight gain.  - I have approached him with the following individualized plan to manage  his diabetes and patient agrees:   Avoiding hypoglycemia is the #1 priority in his care given his age and comorbidities.  An appropriate A1c goal for him would be 7.5-8%.  -he is encouraged to start monitoring glucose 4 times daily, before meals and before bed, to log their readings on the clinic sheets provided, and bring them to review at follow up appointment in 2 weeks.  - he is encouraged to call clinic for blood glucose levels less than 70 or above 300 mg /dl.  - he is advised to continue Tradjenta, therapeutically suitable for patient . -I discussed and lowered his dose of Glipizide to 10 mg XL daily with breakfast (instead of BID). - his Vania Rea will be discontinued, risk outweighs benefit for this patient. - he is not a candidate for Metformin, SGLT2 inhibitors due to concurrent renal insufficiency.  -If he cannot gain control of diabetes with Tradjenta and Glipizide along with diet and  exercise, may need to consider adding basal insulin, which is something he would like to avoid if possible.  - Specific targets for  A1c;  LDL, HDL,  and Triglycerides were discussed with the patient.  2) Blood Pressure /Hypertension:  his blood pressure is controlled to target.   he is advised to continue his current medications including Coreg 12.5 mg po twice daily, Lasix 40 mg po daily  And Entresto 24-26 mg po daily.  3) Lipids/Hyperlipidemia:    There is no recent lipid panel to review.  He is advised to continue Pravastatin 40 mg po daily at bedtime, Fish Oil, Fenofibrate, and Zetia.  Side effects and precautions discussed with him.   4)  Weight/Diet:  his Body mass index is 24.53 kg/m.  - He is not a candidate for weight loss. Exercise, and detailed carbohydrates information provided  -  detailed on discharge instructions.  5) Chronic  Care/Health Maintenance: -he is on Statin medications and is encouraged to initiate and continue to follow up with Ophthalmology, Dentist, Podiatrist at least yearly or according to recommendations, and advised to stay away from smoking. I have recommended yearly flu vaccine and pneumonia vaccine at least every 5 years; moderate intensity exercise for up to 150 minutes weekly; and sleep for at least 7 hours a day.  - he is  advised to maintain close follow up with Adaline Sill, NP for primary care needs, as well as his other providers for optimal and coordinated care.   - Time spent in this patient care: 60 min, of which > 50% was spent in  counseling  him about his diabetes and the rest reviewing his blood glucose logs , discussing his hypoglycemia and hyperglycemia episodes, reviewing his current and  previous labs / studies  ( including abstraction from other facilities) and medications  doses and developing a  long term treatment plan based on the latest standards of care/ guidelines; and documenting his care.    Please refer to Patient  Instructions for Blood Glucose Monitoring and Insulin/Medications Dosing Guide"  in media tab for additional information. Please  also refer to " Patient Self Inventory" in the Media  tab for reviewed elements of pertinent patient history.  Wynn Maudlin participated in the discussions, expressed understanding, and voiced agreement with the above plans.  All questions were answered to his satisfaction. he is encouraged to contact clinic should he have any questions or concerns prior to his return visit.   Follow up plan: - Return in about 2 weeks (around 03/29/2020) for Diabetes follow up, Bring glucometer and logs, No previsit labs, ABI next visit.  Rayetta Pigg, [facility] [address] [place], [place] [address] Phone: [phone] Fax: [phone]  03/15/2020, 4:09 PM

## 2020-03-15 NOTE — Patient Instructions (Signed)
Diabetes Mellitus and Nutrition, Adult When you have diabetes, or diabetes mellitus, it is very important to have healthy eating habits because your blood sugar (glucose) levels are greatly affected by what you eat and drink. Eating healthy foods in the right amounts, at about the same times every day, can help you:  Control your blood glucose.  Lower your risk of heart disease.  Improve your blood pressure.  Reach or maintain a healthy weight. What can affect my meal plan? Every person with diabetes is different, and each person has different needs for a meal plan. Your health care provider may recommend that you work with a dietitian to make a meal plan that is best for you. Your meal plan may vary depending on factors such as:  The calories you need.  The medicines you take.  Your weight.  Your blood glucose, blood pressure, and cholesterol levels.  Your activity level.  Other health conditions you have, such as heart or kidney disease. How do carbohydrates affect me? Carbohydrates, also called carbs, affect your blood glucose level more than any other type of food. Eating carbs naturally raises the amount of glucose in your blood. Carb counting is a method for keeping track of how many carbs you eat. Counting carbs is important to keep your blood glucose at a healthy level, especially if you use insulin or take certain oral diabetes medicines. It is important to know how many carbs you can safely have in each meal. This is different for every person. Your dietitian can help you calculate how many carbs you should have at each meal and for each snack. How does alcohol affect me? Alcohol can cause a sudden decrease in blood glucose (hypoglycemia), especially if you use insulin or take certain oral diabetes medicines. Hypoglycemia can be a life-threatening condition. Symptoms of hypoglycemia, such as sleepiness, dizziness, and confusion, are similar to symptoms of having too much  alcohol.  Do not drink alcohol if: ? Your health care provider tells you not to drink. ? You are pregnant, may be pregnant, or are planning to become pregnant.  If you drink alcohol: ? Do not drink on an empty stomach. ? Limit how much you use to:  0-1 drink a day for women.  0-2 drinks a day for men. ? Be aware of how much alcohol is in your drink. In the [place] one drink equals one 12 oz bottle of beer (355 mL), one 5 oz glass of wine (148 mL), or one 1 oz glass of hard liquor (44 mL). ? Keep yourself hydrated with water, diet soda, or unsweetened iced tea.  Keep in mind that regular soda, juice, and other mixers may contain a lot of sugar and must be counted as carbs. What are tips for following this plan? Reading food labels  Start by checking the serving size on the "Nutrition Facts" label of packaged foods and drinks. The amount of calories, carbs, fats, and other nutrients listed on the label is based on one serving of the item. Many items contain more than one serving per package.  Check the total grams (g) of carbs in one serving. You can calculate the number of servings of carbs in one serving by dividing the total carbs by 15. For example, if a food has 30 g of total carbs per serving, it would be equal to 2 servings of carbs.  Check the number of grams (g) of saturated fats and trans fats in one serving. Choose foods that have   a low amount or none of these fats.  Check the number of milligrams (mg) of salt (sodium) in one serving. Most people should limit total sodium intake to less than 2,300 mg per day.  Always check the nutrition information of foods labeled as "low-fat" or "nonfat." These foods may be higher in added sugar or refined carbs and should be avoided.  Talk to your dietitian to identify your daily goals for nutrients listed on the label. Shopping  Avoid buying canned, pre-made, or processed foods. These foods tend to be high in fat, sodium, and added  sugar.  Shop around the outside edge of the grocery store. This is where you will most often find fresh fruits and vegetables, bulk grains, fresh meats, and fresh dairy. Cooking  Use low-heat cooking methods, such as baking, instead of high-heat cooking methods like deep frying.  Cook using healthy oils, such as olive, canola, or sunflower oil.  Avoid cooking with butter, cream, or high-fat meats. Meal planning  Eat meals and snacks regularly, preferably at the same times every day. Avoid going long periods of time without eating.  Eat foods that are high in fiber, such as fresh fruits, vegetables, beans, and whole grains. Talk with your dietitian about how many servings of carbs you can eat at each meal.  Eat 4-6 oz (112-168 g) of lean protein each day, such as lean meat, chicken, fish, eggs, or tofu. One ounce (oz) of lean protein is equal to: ? 1 oz (28 g) of meat, chicken, or fish. ? 1 egg. ?  cup (62 g) of tofu.  Eat some foods each day that contain healthy fats, such as avocado, nuts, seeds, and fish.   What foods should I eat? Fruits Berries. Apples. Oranges. Peaches. Apricots. Plums. Grapes. Mango. Papaya. Pomegranate. Kiwi. Cherries. Vegetables Lettuce. Spinach. Leafy greens, including kale, chard, collard greens, and mustard greens. Beets. Cauliflower. Cabbage. Broccoli. Carrots. Green beans. Tomatoes. Peppers. Onions. Cucumbers. Brussels sprouts. Grains Whole grains, such as whole-wheat or whole-grain bread, crackers, tortillas, cereal, and pasta. Unsweetened oatmeal. Quinoa. Brown or wild rice. Meats and other proteins Seafood. Poultry without skin. Lean cuts of poultry and beef. Tofu. Nuts. Seeds. Dairy Low-fat or fat-free dairy products such as milk, yogurt, and cheese. The items listed above may not be a complete list of foods and beverages you can eat. Contact a dietitian for more information. What foods should I avoid? Fruits Fruits canned with  syrup. Vegetables Canned vegetables. Frozen vegetables with butter or cream sauce. Grains Refined white flour and flour products such as bread, pasta, snack foods, and cereals. Avoid all processed foods. Meats and other proteins Fatty cuts of meat. Poultry with skin. Breaded or fried meats. Processed meat. Avoid saturated fats. Dairy Full-fat yogurt, cheese, or milk. Beverages Sweetened drinks, such as soda or iced tea. The items listed above may not be a complete list of foods and beverages you should avoid. Contact a dietitian for more information. Questions to ask a health care provider  Do I need to meet with a diabetes educator?  Do I need to meet with a dietitian?  What number can I call if I have questions?  When are the best times to check my blood glucose? Where to find more information:  American Diabetes Association: diabetes.org  Academy of Nutrition and Dietetics: www.eatright.org  National Institute of Diabetes and Digestive and Kidney Diseases: www.niddk.nih.gov  Association of Diabetes Care and Education Specialists: www.diabeteseducator.org Summary  It is important to have healthy eating   habits because your blood sugar (glucose) levels are greatly affected by what you eat and drink.  A healthy meal plan will help you control your blood glucose and maintain a healthy lifestyle.  Your health care provider may recommend that you work with a dietitian to make a meal plan that is best for you.  Keep in mind that carbohydrates (carbs) and alcohol have immediate effects on your blood glucose levels. It is important to count carbs and to use alcohol carefully. This information is not intended to replace advice given to you by your health care provider. Make sure you discuss any questions you have with your health care provider. Document Revised: 01/18/2019 Document Reviewed: 01/18/2019 [employer] Patient Education  2021 [employer]  

## 2020-03-29 ENCOUNTER — Ambulatory Visit (INDEPENDENT_AMBULATORY_CARE_PROVIDER_SITE_OTHER): Payer: Medicare Other | Admitting: Nurse Practitioner

## 2020-03-29 ENCOUNTER — Other Ambulatory Visit: Payer: Self-pay

## 2020-03-29 ENCOUNTER — Encounter: Payer: Self-pay | Admitting: Nurse Practitioner

## 2020-03-29 VITALS — BP 121/68 | HR 84 | Ht 66.0 in | Wt 149.6 lb

## 2020-03-29 DIAGNOSIS — N1831 Chronic kidney disease, stage 3a: Secondary | ICD-10-CM | POA: Diagnosis not present

## 2020-03-29 DIAGNOSIS — I10 Essential (primary) hypertension: Secondary | ICD-10-CM | POA: Diagnosis not present

## 2020-03-29 DIAGNOSIS — E1122 Type 2 diabetes mellitus with diabetic chronic kidney disease: Secondary | ICD-10-CM | POA: Diagnosis not present

## 2020-03-29 DIAGNOSIS — E785 Hyperlipidemia, unspecified: Secondary | ICD-10-CM

## 2020-03-29 NOTE — Patient Instructions (Signed)
Diabetes Mellitus and Nutrition, Adult When you have diabetes, or diabetes mellitus, it is very important to have healthy eating habits because your blood sugar (glucose) levels are greatly affected by what you eat and drink. Eating healthy foods in the right amounts, at about the same times every day, can help you:  Control your blood glucose.  Lower your risk of heart disease.  Improve your blood pressure.  Reach or maintain a healthy weight. What can affect my meal plan? Every person with diabetes is different, and each person has different needs for a meal plan. Your health care provider may recommend that you work with a dietitian to make a meal plan that is best for you. Your meal plan may vary depending on factors such as:  The calories you need.  The medicines you take.  Your weight.  Your blood glucose, blood pressure, and cholesterol levels.  Your activity level.  Other health conditions you have, such as heart or kidney disease. How do carbohydrates affect me? Carbohydrates, also called carbs, affect your blood glucose level more than any other type of food. Eating carbs naturally raises the amount of glucose in your blood. Carb counting is a method for keeping track of how many carbs you eat. Counting carbs is important to keep your blood glucose at a healthy level, especially if you use insulin or take certain oral diabetes medicines. It is important to know how many carbs you can safely have in each meal. This is different for every person. Your dietitian can help you calculate how many carbs you should have at each meal and for each snack. How does alcohol affect me? Alcohol can cause a sudden decrease in blood glucose (hypoglycemia), especially if you use insulin or take certain oral diabetes medicines. Hypoglycemia can be a life-threatening condition. Symptoms of hypoglycemia, such as sleepiness, dizziness, and confusion, are similar to symptoms of having too much  alcohol.  Do not drink alcohol if: ? Your health care provider tells you not to drink. ? You are pregnant, may be pregnant, or are planning to become pregnant.  If you drink alcohol: ? Do not drink on an empty stomach. ? Limit how much you use to:  0-1 drink a day for women.  0-2 drinks a day for men. ? Be aware of how much alcohol is in your drink. In the [place] one drink equals one 12 oz bottle of beer (355 mL), one 5 oz glass of wine (148 mL), or one 1 oz glass of hard liquor (44 mL). ? Keep yourself hydrated with water, diet soda, or unsweetened iced tea.  Keep in mind that regular soda, juice, and other mixers may contain a lot of sugar and must be counted as carbs. What are tips for following this plan? Reading food labels  Start by checking the serving size on the "Nutrition Facts" label of packaged foods and drinks. The amount of calories, carbs, fats, and other nutrients listed on the label is based on one serving of the item. Many items contain more than one serving per package.  Check the total grams (g) of carbs in one serving. You can calculate the number of servings of carbs in one serving by dividing the total carbs by 15. For example, if a food has 30 g of total carbs per serving, it would be equal to 2 servings of carbs.  Check the number of grams (g) of saturated fats and trans fats in one serving. Choose foods that have   a low amount or none of these fats.  Check the number of milligrams (mg) of salt (sodium) in one serving. Most people should limit total sodium intake to less than 2,300 mg per day.  Always check the nutrition information of foods labeled as "low-fat" or "nonfat." These foods may be higher in added sugar or refined carbs and should be avoided.  Talk to your dietitian to identify your daily goals for nutrients listed on the label. Shopping  Avoid buying canned, pre-made, or processed foods. These foods tend to be high in fat, sodium, and added  sugar.  Shop around the outside edge of the grocery store. This is where you will most often find fresh fruits and vegetables, bulk grains, fresh meats, and fresh dairy. Cooking  Use low-heat cooking methods, such as baking, instead of high-heat cooking methods like deep frying.  Cook using healthy oils, such as olive, canola, or sunflower oil.  Avoid cooking with butter, cream, or high-fat meats. Meal planning  Eat meals and snacks regularly, preferably at the same times every day. Avoid going long periods of time without eating.  Eat foods that are high in fiber, such as fresh fruits, vegetables, beans, and whole grains. Talk with your dietitian about how many servings of carbs you can eat at each meal.  Eat 4-6 oz (112-168 g) of lean protein each day, such as lean meat, chicken, fish, eggs, or tofu. One ounce (oz) of lean protein is equal to: ? 1 oz (28 g) of meat, chicken, or fish. ? 1 egg. ?  cup (62 g) of tofu.  Eat some foods each day that contain healthy fats, such as avocado, nuts, seeds, and fish.   What foods should I eat? Fruits Berries. Apples. Oranges. Peaches. Apricots. Plums. Grapes. Mango. Papaya. Pomegranate. Kiwi. Cherries. Vegetables Lettuce. Spinach. Leafy greens, including kale, chard, collard greens, and mustard greens. Beets. Cauliflower. Cabbage. Broccoli. Carrots. Green beans. Tomatoes. Peppers. Onions. Cucumbers. Brussels sprouts. Grains Whole grains, such as whole-wheat or whole-grain bread, crackers, tortillas, cereal, and pasta. Unsweetened oatmeal. Quinoa. Brown or wild rice. Meats and other proteins Seafood. Poultry without skin. Lean cuts of poultry and beef. Tofu. Nuts. Seeds. Dairy Low-fat or fat-free dairy products such as milk, yogurt, and cheese. The items listed above may not be a complete list of foods and beverages you can eat. Contact a dietitian for more information. What foods should I avoid? Fruits Fruits canned with  syrup. Vegetables Canned vegetables. Frozen vegetables with butter or cream sauce. Grains Refined white flour and flour products such as bread, pasta, snack foods, and cereals. Avoid all processed foods. Meats and other proteins Fatty cuts of meat. Poultry with skin. Breaded or fried meats. Processed meat. Avoid saturated fats. Dairy Full-fat yogurt, cheese, or milk. Beverages Sweetened drinks, such as soda or iced tea. The items listed above may not be a complete list of foods and beverages you should avoid. Contact a dietitian for more information. Questions to ask a health care provider  Do I need to meet with a diabetes educator?  Do I need to meet with a dietitian?  What number can I call if I have questions?  When are the best times to check my blood glucose? Where to find more information:  American Diabetes Association: diabetes.org  Academy of Nutrition and Dietetics: www.eatright.org  National Institute of Diabetes and Digestive and Kidney Diseases: www.niddk.nih.gov  Association of Diabetes Care and Education Specialists: www.diabeteseducator.org Summary  It is important to have healthy eating   habits because your blood sugar (glucose) levels are greatly affected by what you eat and drink.  A healthy meal plan will help you control your blood glucose and maintain a healthy lifestyle.  Your health care provider may recommend that you work with a dietitian to make a meal plan that is best for you.  Keep in mind that carbohydrates (carbs) and alcohol have immediate effects on your blood glucose levels. It is important to count carbs and to use alcohol carefully. This information is not intended to replace advice given to you by your health care provider. Make sure you discuss any questions you have with your health care provider. Document Revised: 01/18/2019 Document Reviewed: 01/18/2019 [employer] Patient Education  2021 [employer]  

## 2020-03-29 NOTE — Progress Notes (Signed)
Endocrinology Follow Up Note       03/29/2020, 2:30 PM   Subjective:    Patient ID: Oscar [name], male    DOB: 05-06-1940.  Oscar [name] is being seen in follow up after being seen in consultation for management of currently uncontrolled symptomatic diabetes requested by  Adaline Sill, NP.   Past Medical History:  Diagnosis Date  . Anemia   . Atrial fibrillation ([place])    permanent  . CAD (coronary artery disease)    a. cath on 07/29/2018 showed 100% Proximal-RCA stenosis, 99% Proximal-LAD, 75% Ost LM, 70% LCx, 40% PDA, and 30% Ost LCx. Declined CABG  . Cardiomyopathy    EF 35% in the past--Improved to 55% echo 2/10. Moderately severe mitral regurgitation in the past, now improved,  . Carotid artery stenosis   . CHF (congestive heart failure) ([place]) 2002  . COPD (chronic obstructive pulmonary disease) ([place])   . Coronary artery disease    non obstructive  . Diabetes mellitus   . Dyslipidemia   . History of tobacco abuse   . Nephrolithiasis   . Tremors of nervous system     Past Surgical History:  Procedure Laterality Date  . CAROTID ENDARTERECTOMY Left 10-21-05   cea  . CATARACT EXTRACTION W/PHACO Left 09/10/2018   Procedure: CATARACT EXTRACTION PHACO AND INTRAOCULAR LENS PLACEMENT LEFT EYE;  Surgeon: Baruch Goldmann, MD;  Location: [facility];  Service: Ophthalmology;  Laterality: Left;  left  . CATARACT EXTRACTION W/PHACO Right 11/12/2018   Procedure: CATARACT EXTRACTION PHACO AND INTRAOCULAR LENS PLACEMENT RIGHT EYE;  Surgeon: Baruch Goldmann, MD;  Location: [facility];  Service: Ophthalmology;  Laterality: Right;  right  . ENDARTERECTOMY Right 08/11/2018   Procedure: ENDARTERECTOMY CAROTID RIGHT;  Surgeon: [name], MD;  Location: [facility] OR;  Service: Vascular;  Laterality: Right;  . left carotid endarterectomy    . PATCH ANGIOPLASTY Right 08/11/2018   Procedure: PATCH ANGIOPLAST USING HEMASHIELD PLATINUM  FINESSE;  Surgeon: [name], MD;  Location: [place];  Service: Vascular;  Laterality: Right;  . RIGHT/LEFT HEART CATH AND CORONARY ANGIOGRAPHY N/A 07/29/2018   Procedure: RIGHT/LEFT HEART CATH AND CORONARY ANGIOGRAPHY;  Surgeon: Jolaine Artist, MD;  Location: [place] [facility];  Service: Cardiovascular;  Laterality: N/A;  . TONSILLECTOMY      Social History   Socioeconomic History  . Marital status: Married    Spouse name: Not on file  . Number of children: 5  . Years of education: 22  . Highest education level: Not on file  Occupational History  . Occupation: Retired  Tobacco Use  . Smoking status: Former Smoker    Types: Cigarettes    Quit date: 02/25/2000    Years since quitting: 20.1  . Smokeless tobacco: Former Network engineer  . Vaping Use: Never used  Substance and Sexual Activity  . Alcohol use: No  . Drug use: No  . Sexual activity: Not on file  Other Topics Concern  . Not on file  Social History Narrative   Lives at home with his wife.   Right-handed.   3-4 cups caffeine per day.   Social Determinants of Health  Financial Resource Strain: Low Risk   . Difficulty of Paying Living Expenses: Not hard at all  Food Insecurity: No Food Insecurity  . Worried About Charity fundraiser in the Last Year: Never true  . Ran Out of Food in the Last Year: Never true  Transportation Needs: No Transportation Needs  . Lack of Transportation (Medical): No  . Lack of Transportation (Non-Medical): No  Physical Activity: Insufficiently Active  . Days of Exercise per Week: 2 days  . Minutes of Exercise per Session: 20 min  Stress: No Stress Concern Present  . Feeling of Stress : Not at all  Social Connections: Moderately Integrated  . Frequency of Communication with Friends and Family: Twice a week  . Frequency of Social Gatherings with Friends and Family: Twice a week  . Attends Religious Services: 1 to 4 times per year  . Active Member of Clubs or Organizations: No   . Attends Archivist Meetings: Never  . Marital Status: Married    Family History  Problem Relation Age of Onset  . Coronary artery disease Mother   . Diabetes Mother   . Heart disease Mother        Before age 39 and  CHF  . Hypertension Mother   . Other Mother        varicose veins  . Heart attack Mother   . Heart disease Sister   . Hypertension Sister   . Heart attack Sister   . Heart disease Brother   . Hyperlipidemia Brother   . Hypertension Brother   . Other Brother        varicose veins  . Heart attack Brother   . Heart disease Son 41       Heart Disease before age 81- Open hear surgery  . Deep vein thrombosis Son   . Heart attack Son   . Coronary artery disease Other   . Diabetes Daughter   . Hypertension Daughter   . Other Daughter        varicose veins    Outpatient Encounter Medications as of 03/29/2020  Medication Sig  . acetaminophen (TYLENOL) 325 MG tablet Take 2 tablets (650 mg total) by mouth every 6 (six) hours as needed for mild pain (or Fever >/= 101).  Marland Kitchen albuterol (PROAIR HFA) 108 (90 Base) MCG/ACT inhaler Inhale 2 puffs into the lungs every 6 (six) hours as needed for wheezing or shortness of breath.  Marland Kitchen aspirin EC 81 MG EC tablet Take 1 tablet (81 mg total) by mouth daily.  . carvedilol (COREG) 12.5 MG tablet Take 1 tablet (12.5 mg total) by mouth 2 (two) times daily.  . Cholecalciferol 25 MCG (1000 UT) capsule Take 1,000 Units by mouth daily.   . digoxin (LANOXIN) 0.125 MG tablet Take 1 tablet (125 mcg total) by mouth daily.  Marland Kitchen ENTRESTO 24-26 MG TAKE 1 TABLET TWICE A DAY  . ezetimibe (ZETIA) 10 MG tablet Take 10 mg by mouth daily.  . fenofibrate 160 MG tablet Take 160 mg by mouth daily.  . furosemide (LASIX) 40 MG tablet TAKE 1 TABLET DAILY (MAINTENANCE DOSE IS NOW 40 MG DAILY)  . glipiZIDE (GLIPIZIDE XL) 10 MG 24 hr tablet Take 1 tablet (10 mg total) by mouth daily with breakfast.  . ipratropium (ATROVENT) 0.02 % nebulizer solution Take  2.5 mLs (0.5 mg dose) by nebulization 2 (two) times daily.  Marland Kitchen linagliptin (TRADJENTA) 5 MG TABS tablet Take 1 tablet (5 mg total) by mouth daily.  Marland Kitchen  Multiple Vitamin (MULTIVITAMIN) tablet Take 1 tablet by mouth daily.  . Omega-3 Fatty Acids (FISH OIL) 1200 MG CAPS Take 1,000 mg by mouth daily.   . pantoprazole (PROTONIX) 40 MG tablet Take 1 tablet (40 mg total) by mouth daily.  . pravastatin (PRAVACHOL) 40 MG tablet Take 80 mg by mouth daily.   Marland Kitchen warfarin (COUMADIN) 7.5 MG tablet Take 1 tablet (7.5 mg total) by mouth daily.  . [DISCONTINUED] spironolactone (ALDACTONE) 25 MG tablet Take 1 tablet (25 mg total) by mouth daily.   No facility-administered encounter medications on file as of 03/29/2020.    ALLERGIES: Allergies  Allergen Reactions  . Penicillins Hives and Other (See Comments)    Blisters on hands  . Statins Other (See Comments)    Joint and muscle pain and low energy.  Marland Kitchen Spironolactone     Hyperkalemia    VACCINATION STATUS:  There is no immunization history on file for this patient.  Diabetes He presents for his follow-up diabetic visit. He has type 2 diabetes mellitus. Onset time: Diagnosed at approx age of 63. His disease course has been improving. There are no hypoglycemic associated symptoms. Associated symptoms include fatigue. Pertinent negatives for diabetes include no blurred vision and no foot paresthesias. There are no hypoglycemic complications. Symptoms are improving. Diabetic complications include a CVA, heart disease and nephropathy. Risk factors for coronary artery disease include diabetes mellitus, dyslipidemia, hypertension, male sex and sedentary lifestyle. Current diabetic treatment includes oral agent (dual therapy). He is compliant with treatment most of the time. His weight is stable. He is following a generally healthy diet. When asked about meal planning, he reported none. He has not had a previous visit with a dietitian. He rarely participates in  exercise. His home blood glucose trend is fluctuating dramatically. His breakfast blood glucose range is generally >200 mg/dl. His lunch blood glucose range is generally >200 mg/dl. His dinner blood glucose range is generally >200 mg/dl. His bedtime blood glucose range is generally >200 mg/dl. His overall blood glucose range is >200 mg/dl. (He presents today with his meter and logs showing greatly fluctuating glycemic profile, mostly above 200.  He does have significant random hypoglycemia noted, in which he has to treat with candy.  Analysis of his meter shows 7-day average on 211, 14-day average of 202, 30-day average of 200.) An ACE inhibitor/angiotensin II receptor blocker is contraindicated. He does not see a podiatrist.Eye exam is current.  Hypertension This is a chronic problem. The current episode started more than 1 year ago. The problem has been resolved since onset. The problem is controlled. Pertinent negatives include no blurred vision. There are no associated agents to hypertension. Risk factors for coronary artery disease include diabetes mellitus, dyslipidemia, male gender and sedentary lifestyle. Past treatments include beta blockers and diuretics. The current treatment provides moderate improvement. There are no compliance problems.  Hypertensive end-organ damage includes kidney disease, CAD/MI, CVA and heart failure. Identifiable causes of hypertension include chronic renal disease and renovascular disease.  Hyperlipidemia This is a chronic problem. The current episode started more than 1 year ago. The problem is controlled. Recent lipid tests were reviewed and are normal. Exacerbating diseases include chronic renal disease, diabetes and nephrotic syndrome. Factors aggravating his hyperlipidemia include beta blockers and fatty foods. Current antihyperlipidemic treatment includes statins. There are no compliance problems.  Risk factors for coronary artery disease include diabetes mellitus,  dyslipidemia, hypertension, male sex and a sedentary lifestyle.     Review of systems  Constitutional: +  Minimally fluctuating body weight,  current Body mass index is 24.15 kg/m. , + fatigue, no subjective hyperthermia, no subjective hypothermia Eyes: no blurry vision, no xerophthalmia ENT: no sore throat, no nodules palpated in throat, no dysphagia/odynophagia, no hoarseness Cardiovascular: no chest pain, no shortness of breath, no palpitations, no leg swelling Respiratory: no cough, no shortness of breath Gastrointestinal: no nausea/vomiting/diarrhea Musculoskeletal: no muscle/joint aches Skin: no rashes, no hyperemia Neurological: no tremors, no numbness, no tingling, no dizziness Psychiatric: no depression, no anxiety  Objective:     Ht 5\' 6"  (1.676 m)   Wt 149 lb 9.6 oz (67.9 kg)   BMI 24.15 kg/m   Wt Readings from Last 3 Encounters:  03/29/20 149 lb 9.6 oz (67.9 kg)  03/15/20 152 lb (68.9 kg)  03/14/20 151 lb 6.4 oz (68.7 kg)    BP Readings from Last 3 Encounters:  03/15/20 101/63  03/14/20 124/60  09/12/19 110/64     Physical Exam- Limited  Constitutional:  Body mass index is 24.15 kg/m. , not in acute distress, normal state of mind Eyes:  EOMI, no exophthalmos Neck: Supple Cardiovascular: RRR, no murmers, rubs, or gallops, no edema Respiratory: Adequate breathing efforts, no crackles, rales, rhonchi, or wheezing Musculoskeletal: no gross deformities, strength intact in all four extremities, no gross restriction of joint movements Skin:  no rashes, no hyperemia Neurological: no tremor with outstretched hands  Foot exam:   No rashes, ulcers, cuts, + calluses, + onychodystrophy.   Decreased pulses bilat.  Good sensation to 10 g monofilament bilat.  + dry flaky skin   POCT ABI Results 03/29/20   Right ABI:  PAD      Left ABI:  PAD  Right leg systolic / diastolic: PAD mmHg Left leg systolic / diastolic: PAD mmHg  Arm systolic / diastolic: 540/98  mmHG  Detailed report will be scanned into patient chart.    CMP ( most recent) CMP     Component Value Date/Time   NA 134 (L) 08/04/2019 1449   K 4.0 08/04/2019 1449   CL 94 (A) 02/02/2020 0000   CO2 25 08/04/2019 1449   GLUCOSE 274 (H) 08/04/2019 1449   BUN 38 (A) 02/02/2020 0000   CREATININE 1.5 (A) 02/02/2020 0000   CREATININE 1.36 (H) 08/04/2019 1449   CALCIUM 8.8 (L) 08/04/2019 1449   PROT 6.9 08/04/2019 1449   ALBUMIN 3.6 08/04/2019 1449   AST 33 08/04/2019 1449   ALT 37 08/04/2019 1449   ALKPHOS 83 08/04/2019 1449   BILITOT 0.5 08/04/2019 1449   GFRNONAA 45 02/02/2020 0000   GFRAA 57 (L) 08/04/2019 1449     Diabetic Labs (most recent): Lab Results  Component Value Date   HGBA1C 9.5 02/02/2020   HGBA1C 6.9 (H) 07/25/2018     Lipid Panel ( most recent) Lipid Panel     Component Value Date/Time   CHOL 95 07/25/2018 0443   TRIG 113 07/25/2018 0443   HDL 17 (L) 07/25/2018 0443   CHOLHDL 5.6 07/25/2018 0443   VLDL 23 07/25/2018 0443   LDLCALC 55 07/25/2018 0443      Lab Results  Component Value Date   TSH 0.997 06/16/2014           Assessment & Plan:   1) Type 2 Diabetes with CKD stage 3a  - Howell Pringle Minihan has currently uncontrolled symptomatic type 2 DM since 80 years of age, with most recent A1c of 9.5 %.   He presents today with his meter and logs  showing greatly fluctuating glycemic profile, mostly above 200.  He does have significant random hypoglycemia noted, in which he has to treat with candy.  Analysis of his meter shows 7-day average on 211, 14-day average of 202, 30-day average of 200.  He did not reduce his dose of Glipizide as instructed at last visit.  -Recent labs reviewed.  - I had a long discussion with him about the progressive nature of diabetes and the pathology behind its complications. -his diabetes is complicated by TIA, MI, CHF, CAD, CKD and he remains at a high risk for more acute and chronic complications which include  retinopathy, and neuropathy. These are all discussed in detail with him.  - Nutritional counseling repeated at each appointment due to patients tendency to fall back in to old habits.  - The patient admits there is a room for improvement in their diet and drink choices. -  Suggestion is made for the patient to avoid simple carbohydrates from their diet including Cakes, Sweet Desserts / Pastries, Ice Cream, Soda (diet and regular), Sweet Tea, Candies, Chips, Cookies, Sweet Pastries,  Store Bought Juices, Alcohol in Excess of  1-2 drinks a day, Artificial Sweeteners, Coffee Creamer, and "Sugar-free" Products. This will help patient to have stable blood glucose profile and potentially avoid unintended weight gain.   - I encouraged the patient to switch to  unprocessed or minimally processed complex starch and increased protein intake (animal or plant source), fruits, and vegetables.   - Patient is advised to stick to a routine mealtimes to eat 3 meals  a day and avoid unnecessary snacks ( to snack only to correct hypoglycemia).  - I have approached him with the following individualized plan to manage  his diabetes and patient agrees:   Avoiding hypoglycemia is the #1 priority in his care given his age and comorbidities.  An appropriate A1c goal for him would be 7.5-8%.  -he is encouraged to continue monitoring blood glucose twice daily, before breakfast and before bed, and to call the clinic if he has readings less than 70 or greater than 300 for 3 tests in a row.  - he is advised to continue Tradjenta 5 mg po daily, therapeutically suitable for patient . -He is advised to lower dose of Glipizide to 10 mg XL daily with breakfast.  - he is not a candidate for Metformin, SGLT2 inhibitors due to concurrent renal insufficiency.  -If he cannot gain control of diabetes with Tradjenta and Glipizide along with diet and exercise, may need to consider adding basal insulin, which is something he would like  to avoid if possible.  - Specific targets for  A1c;  LDL, HDL,  and Triglycerides were discussed with the patient.  2) Blood Pressure /Hypertension:  his blood pressure is controlled to target.   he is advised to continue his current medications including Coreg 12.5 mg po twice daily, Lasix 40 mg po daily, and Entresto 24-26 mg po daily.  3) Lipids/Hyperlipidemia:    There is no recent lipid panel to review.  He is advised to continue Pravastatin 40 mg po daily at bedtime, Fish Oil, Fenofibrate, and Zetia.  Side effects and precautions discussed with him.   4)  Weight/Diet:  his Body mass index is 24.15 kg/m.  - He is not a candidate for weight loss. Exercise, and detailed carbohydrates information provided  -  detailed on discharge instructions.  5) Chronic Care/Health Maintenance: -he is on Statin medications and is encouraged to initiate and continue  to follow up with Ophthalmology, Dentist, Podiatrist at least yearly or according to recommendations, and advised to stay away from smoking. I have recommended yearly flu vaccine and pneumonia vaccine at least every 5 years; moderate intensity exercise for up to 150 minutes weekly; and sleep for at least 7 hours a day.  -His ABI results are suggestive of PAD.  He already sees Dr. Donnetta Hutching, vein specialist as he had blocked carotid arteries previously.  He says he will follow up with Dr. Donnetta Hutching regarding his ABI results today.  Copy given to patient today.  - he is advised to maintain close follow up with Adaline Sill, NP for primary care needs, as well as his other providers for optimal and coordinated care.   - Time spent on this patient care encounter:  35 min, of which > 50% was spent in  counseling and the rest reviewing his blood glucose logs , discussing his hypoglycemia and hyperglycemia episodes, reviewing his current and  previous labs / studies  ( including abstraction from other facilities) and medications  doses and developing a   long term treatment plan and documenting his care.   Please refer to Patient Instructions for Blood Glucose Monitoring and Insulin/Medications Dosing Guide"  in media tab for additional information. Please  also refer to " Patient Self Inventory" in the Media  tab for reviewed elements of pertinent patient history.  Wynn Maudlin participated in the discussions, expressed understanding, and voiced agreement with the above plans.  All questions were answered to his satisfaction. he is encouraged to contact clinic should he have any questions or concerns prior to his return visit.   Follow up plan: - Return in about 3 months (around 06/26/2020) for Diabetes follow up with A1c in office, No previsit labs, Bring glucometer and logs.  Rayetta Pigg, [facility] [address] [place], [place] [address] Phone: [phone] Fax: [phone]  03/29/2020, 2:30 PM

## 2020-05-02 LAB — BASIC METABOLIC PANEL
BUN: 24 — AB (ref 4–21)
CO2: 20 (ref 13–22)
Chloride: 97 — AB (ref 99–108)
Creatinine: 1.1 (ref 0.6–1.3)
Glucose: 220
Potassium: 4.8 (ref 3.4–5.3)
Sodium: 134 — AB (ref 137–147)

## 2020-05-02 LAB — LIPID PANEL
Cholesterol: 152 (ref 0–200)
HDL: 28 — AB (ref 35–70)
LDL Cholesterol: 88
Triglycerides: 209 — AB (ref 40–160)

## 2020-05-02 LAB — HEPATIC FUNCTION PANEL
ALT: 28 (ref 10–40)
AST: 30 (ref 14–40)
Alkaline Phosphatase: 51 (ref 25–125)
Bilirubin, Total: 0.4

## 2020-05-02 LAB — COMPREHENSIVE METABOLIC PANEL
Albumin: 4.3 (ref 3.5–5.0)
Calcium: 9.6 (ref 8.7–10.7)
Globulin: 2.1

## 2020-05-02 LAB — HEMOGLOBIN A1C: Hemoglobin A1C: 8.3

## 2020-05-11 LAB — PROTIME-INR

## 2020-06-20 ENCOUNTER — Telehealth: Payer: Self-pay | Admitting: Student

## 2020-06-20 DIAGNOSIS — E785 Hyperlipidemia, unspecified: Secondary | ICD-10-CM

## 2020-06-20 NOTE — Telephone Encounter (Signed)
Patient has requested to have lab work drawn at [facility] the morning before his afternoon appt with Bernerd Pho, PA-C (Per Dr. Percival Spanish).

## 2020-06-20 NOTE — Telephone Encounter (Signed)
Orders placed per B. Ahmed Prima, PA-C

## 2020-06-20 NOTE — Telephone Encounter (Signed)
Patient would like to get his lab work done the same day as his appointment with [place] in June , can you make sure his lab orders are in ? He thinks he needs his lipids drawn?

## 2020-06-25 ENCOUNTER — Encounter: Payer: Self-pay | Admitting: Nurse Practitioner

## 2020-06-25 ENCOUNTER — Ambulatory Visit (INDEPENDENT_AMBULATORY_CARE_PROVIDER_SITE_OTHER): Payer: Medicare Other | Admitting: Nurse Practitioner

## 2020-06-25 ENCOUNTER — Other Ambulatory Visit: Payer: Self-pay

## 2020-06-25 VITALS — BP 137/68 | HR 78 | Ht 66.0 in | Wt 154.0 lb

## 2020-06-25 DIAGNOSIS — N1831 Chronic kidney disease, stage 3a: Secondary | ICD-10-CM | POA: Diagnosis not present

## 2020-06-25 DIAGNOSIS — I10 Essential (primary) hypertension: Secondary | ICD-10-CM | POA: Diagnosis not present

## 2020-06-25 DIAGNOSIS — E1122 Type 2 diabetes mellitus with diabetic chronic kidney disease: Secondary | ICD-10-CM | POA: Diagnosis not present

## 2020-06-25 DIAGNOSIS — E785 Hyperlipidemia, unspecified: Secondary | ICD-10-CM

## 2020-06-25 NOTE — Progress Notes (Signed)
Endocrinology Follow Up Note       06/25/2020, 2:35 PM   Subjective:    Patient ID: Oscar [name], male    DOB: [birth year].  Oscar [name] is being seen in follow up after being seen in consultation for management of currently uncontrolled symptomatic diabetes requested by  Adaline Sill, NP.   Past Medical History:  Diagnosis Date  . Anemia   . Atrial fibrillation ([place])    permanent  . CAD (coronary artery disease)    a. cath on 07/29/2018 showed 100% Proximal-RCA stenosis, 99% Proximal-LAD, 75% Ost LM, 70% LCx, 40% PDA, and 30% Ost LCx. Declined CABG  . Cardiomyopathy    EF 35% in the past--Improved to 55% echo 2/10. Moderately severe mitral regurgitation in the past, now improved,  . Carotid artery stenosis   . CHF (congestive heart failure) ([place]) 2002  . COPD (chronic obstructive pulmonary disease) ([place])   . Coronary artery disease    non obstructive  . Diabetes mellitus   . Dyslipidemia   . History of tobacco abuse   . Nephrolithiasis   . Tremors of nervous system     Past Surgical History:  Procedure Laterality Date  . CAROTID ENDARTERECTOMY Left 10-21-05   cea  . CATARACT EXTRACTION W/PHACO Left 09/10/2018   Procedure: CATARACT EXTRACTION PHACO AND INTRAOCULAR LENS PLACEMENT LEFT EYE;  Surgeon: Baruch Goldmann, MD;  Location: [facility];  Service: Ophthalmology;  Laterality: Left;  left  . CATARACT EXTRACTION W/PHACO Right 11/12/2018   Procedure: CATARACT EXTRACTION PHACO AND INTRAOCULAR LENS PLACEMENT RIGHT EYE;  Surgeon: Baruch Goldmann, MD;  Location: [facility];  Service: Ophthalmology;  Laterality: Right;  right  . ENDARTERECTOMY Right 08/11/2018   Procedure: ENDARTERECTOMY CAROTID RIGHT;  Surgeon: [name], MD;  Location: [facility] OR;  Service: Vascular;  Laterality: Right;  . left carotid endarterectomy    . PATCH ANGIOPLASTY Right 08/11/2018   Procedure: PATCH ANGIOPLAST USING HEMASHIELD PLATINUM  FINESSE;  Surgeon: [name], MD;  Location: [facility];  Service: Vascular;  Laterality: Right;  . RIGHT/LEFT HEART CATH AND CORONARY ANGIOGRAPHY N/A 07/29/2018   Procedure: RIGHT/LEFT HEART CATH AND CORONARY ANGIOGRAPHY;  Surgeon: Jolaine Artist, MD;  Location: [facility];  Service: Cardiovascular;  Laterality: N/A;  . TONSILLECTOMY      Social History   Socioeconomic History  . Marital status: Married    Spouse name: Not on file  . Number of children: 5  . Years of education: 24  . Highest education level: Not on file  Occupational History  . Occupation: Retired  Tobacco Use  . Smoking status: Former Smoker    Types: Cigarettes    Quit date: 02/25/2000    Years since quitting: 20.3  . Smokeless tobacco: Former Network engineer  . Vaping Use: Never used  Substance and Sexual Activity  . Alcohol use: No  . Drug use: No  . Sexual activity: Not on file  Other Topics Concern  . Not on file  Social History Narrative   Lives at home with his wife.   Right-handed.   3-4 cups caffeine per day.   Social Determinants of Health  Financial Resource Strain: Low Risk   . Difficulty of Paying Living Expenses: Not hard at all  Food Insecurity: No Food Insecurity  . Worried About Charity fundraiser in the Last Year: Never true  . Ran Out of Food in the Last Year: Never true  Transportation Needs: No Transportation Needs  . Lack of Transportation (Medical): No  . Lack of Transportation (Non-Medical): No  Physical Activity: Insufficiently Active  . Days of Exercise per Week: 2 days  . Minutes of Exercise per Session: 20 min  Stress: No Stress Concern Present  . Feeling of Stress : Not at all  Social Connections: Moderately Integrated  . Frequency of Communication with Friends and Family: Twice a week  . Frequency of Social Gatherings with Friends and Family: Twice a week  . Attends Religious Services: 1 to 4 times per year  . Active Member of Clubs or Organizations: No   . Attends Archivist Meetings: Never  . Marital Status: Married    Family History  Problem Relation Age of Onset  . Coronary artery disease Mother   . Diabetes Mother   . Heart disease Mother        Before age 33 and  CHF  . Hypertension Mother   . Other Mother        varicose veins  . Heart attack Mother   . Heart disease Sister   . Hypertension Sister   . Heart attack Sister   . Heart disease Brother   . Hyperlipidemia Brother   . Hypertension Brother   . Other Brother        varicose veins  . Heart attack Brother   . Heart disease Son 31       Heart Disease before age 18- Open hear surgery  . Deep vein thrombosis Son   . Heart attack Son   . Coronary artery disease Other   . Diabetes Daughter   . Hypertension Daughter   . Other Daughter        varicose veins    Outpatient Encounter Medications as of 06/25/2020  Medication Sig  . acetaminophen (TYLENOL) 325 MG tablet Take 2 tablets (650 mg total) by mouth every 6 (six) hours as needed for mild pain (or Fever >/= 101).  Marland Kitchen albuterol (PROAIR HFA) 108 (90 Base) MCG/ACT inhaler Inhale 2 puffs into the lungs every 6 (six) hours as needed for wheezing or shortness of breath.  Marland Kitchen aspirin EC 81 MG EC tablet Take 1 tablet (81 mg total) by mouth daily.  . carvedilol (COREG) 12.5 MG tablet Take 1 tablet (12.5 mg total) by mouth 2 (two) times daily.  . Cholecalciferol 25 MCG (1000 UT) capsule Take 1,000 Units by mouth daily.   . digoxin (LANOXIN) 0.125 MG tablet Take 1 tablet (125 mcg total) by mouth daily.  Marland Kitchen ENTRESTO 24-26 MG TAKE 1 TABLET TWICE A DAY  . ezetimibe (ZETIA) 10 MG tablet Take 10 mg by mouth daily.  . fenofibrate 160 MG tablet Take 160 mg by mouth daily.  . furosemide (LASIX) 40 MG tablet TAKE 1 TABLET DAILY (MAINTENANCE DOSE IS NOW 40 MG DAILY)  . glipiZIDE (GLIPIZIDE XL) 10 MG 24 hr tablet Take 1 tablet (10 mg total) by mouth daily with breakfast.  . ipratropium (ATROVENT) 0.02 % nebulizer solution Take  2.5 mLs (0.5 mg dose) by nebulization 2 (two) times daily.  Marland Kitchen linagliptin (TRADJENTA) 5 MG TABS tablet Take 1 tablet (5 mg total) by mouth daily.  Marland Kitchen  Multiple Vitamin (MULTIVITAMIN) tablet Take 1 tablet by mouth daily.  . Omega-3 Fatty Acids (FISH OIL) 1200 MG CAPS Take 1,000 mg by mouth daily.   . pantoprazole (PROTONIX) 40 MG tablet Take 1 tablet (40 mg total) by mouth daily.  . pravastatin (PRAVACHOL) 40 MG tablet Take 80 mg by mouth daily.   Marland Kitchen warfarin (COUMADIN) 7.5 MG tablet Take 1 tablet (7.5 mg total) by mouth daily.  . [DISCONTINUED] spironolactone (ALDACTONE) 25 MG tablet Take 1 tablet (25 mg total) by mouth daily.   No facility-administered encounter medications on file as of 06/25/2020.    ALLERGIES: Allergies  Allergen Reactions  . Penicillins Hives and Other (See Comments)    Blisters on hands  . Statins Other (See Comments)    Joint and muscle pain and low energy.  Marland Kitchen Spironolactone     Hyperkalemia    VACCINATION STATUS:  There is no immunization history on file for this patient.  Diabetes He presents for his follow-up diabetic visit. He has type 2 diabetes mellitus. Onset time: Diagnosed at approx age of 54. His disease course has been improving. There are no hypoglycemic associated symptoms. Associated symptoms include fatigue. Pertinent negatives for diabetes include no blurred vision and no foot paresthesias. There are no hypoglycemic complications. Symptoms are improving. Diabetic complications include a CVA, heart disease and nephropathy. Risk factors for coronary artery disease include diabetes mellitus, dyslipidemia, hypertension, male sex and sedentary lifestyle. Current diabetic treatment includes oral agent (dual therapy). He is compliant with treatment most of the time. His weight is stable. He is following a generally healthy diet. When asked about meal planning, he reported none. He has not had a previous visit with a dietitian. He rarely participates in  exercise. His home blood glucose trend is fluctuating dramatically. His breakfast blood glucose range is generally 130-140 mg/dl. His overall blood glucose range is 180-200 mg/dl. (He presents today with his meter and logs showing greatly improved glycemic profile overall.  His previsit A1c on 05/02/20 was 8.3%, improving from previous 9.5%.  He has cut back on his carb intake.  He denies any hypoglycemia.) An ACE inhibitor/angiotensin II receptor blocker is contraindicated. He does not see a podiatrist.Eye exam is current.  Hypertension This is a chronic problem. The current episode started more than 1 year ago. The problem has been resolved since onset. The problem is controlled. Pertinent negatives include no blurred vision. There are no associated agents to hypertension. Risk factors for coronary artery disease include diabetes mellitus, dyslipidemia, male gender and sedentary lifestyle. Past treatments include beta blockers and diuretics. The current treatment provides moderate improvement. There are no compliance problems.  Hypertensive end-organ damage includes kidney disease, CAD/MI, CVA and heart failure. Identifiable causes of hypertension include chronic renal disease and renovascular disease.  Hyperlipidemia This is a chronic problem. The current episode started more than 1 year ago. The problem is controlled. Recent lipid tests were reviewed and are normal. Exacerbating diseases include chronic renal disease, diabetes and nephrotic syndrome. Factors aggravating his hyperlipidemia include beta blockers and fatty foods. Current antihyperlipidemic treatment includes statins. There are no compliance problems.  Risk factors for coronary artery disease include diabetes mellitus, dyslipidemia, hypertension, male sex and a sedentary lifestyle.     Review of systems  Constitutional: + Minimally fluctuating body weight,  current Body mass index is 24.86 kg/m. , + fatigue, no subjective hyperthermia, no  subjective hypothermia Eyes: no blurry vision, no xerophthalmia ENT: no sore throat, no nodules palpated in throat,  no dysphagia/odynophagia, no hoarseness Cardiovascular: no chest pain, no shortness of breath, no palpitations, no leg swelling Respiratory: no cough, no shortness of breath Gastrointestinal: no nausea/vomiting/diarrhea Musculoskeletal: no muscle/joint aches Skin: no rashes, no hyperemia Neurological: no tremors, no numbness, no tingling, no dizziness Psychiatric: no depression, no anxiety  Objective:     BP 137/68   Pulse 78   Ht 5\' 6"  (1.676 m)   Wt 154 lb (69.9 kg)   BMI 24.86 kg/m   Wt Readings from Last 3 Encounters:  06/25/20 154 lb (69.9 kg)  03/29/20 149 lb 9.6 oz (67.9 kg)  03/15/20 152 lb (68.9 kg)    BP Readings from Last 3 Encounters:  06/25/20 137/68  03/29/20 121/68  03/15/20 101/63      Physical Exam- Limited  Constitutional:  Body mass index is 24.86 kg/m. , not in acute distress, normal state of mind Eyes:  EOMI, no exophthalmos Neck: Supple Cardiovascular: RRR, no murmers, rubs, or gallops, no edema Respiratory: Adequate breathing efforts, no crackles, rales, rhonchi, or wheezing Musculoskeletal: no gross deformities, strength intact in all four extremities, no gross restriction of joint movements Skin:  no rashes, no hyperemia Neurological: no tremor with outstretched hands    CMP ( most recent) CMP     Component Value Date/Time   NA 134 (A) 05/02/2020 0000   K 4.8 05/02/2020 0000   CL 97 (A) 05/02/2020 0000   CO2 20 05/02/2020 0000   GLUCOSE 274 (H) 08/04/2019 1449   BUN 24 (A) 05/02/2020 0000   CREATININE 1.1 05/02/2020 0000   CREATININE 1.36 (H) 08/04/2019 1449   CALCIUM 9.6 05/02/2020 0000   PROT 6.9 08/04/2019 1449   ALBUMIN 4.3 05/02/2020 0000   AST 30 05/02/2020 0000   ALT 28 05/02/2020 0000   ALKPHOS 51 05/02/2020 0000   BILITOT 0.5 08/04/2019 1449   GFRNONAA 45 02/02/2020 0000   GFRAA 57 (L) 08/04/2019 1449      Diabetic Labs (most recent): Lab Results  Component Value Date   HGBA1C 8.3 05/02/2020   HGBA1C 9.5 02/02/2020   HGBA1C 6.9 (H) 07/25/2018     Lipid Panel ( most recent) Lipid Panel     Component Value Date/Time   CHOL 152 05/02/2020 0000   TRIG 209 (A) 05/02/2020 0000   HDL 28 (A) 05/02/2020 0000   CHOLHDL 5.6 07/25/2018 0443   VLDL 23 07/25/2018 0443   LDLCALC 88 05/02/2020 0000      Lab Results  Component Value Date   TSH 0.997 06/16/2014           Assessment & Plan:   1) Type 2 Diabetes with CKD stage 3a  - Oscar [name] has currently uncontrolled symptomatic type 2 DM since 80 years of age.  He presents today with his meter and logs showing greatly improved glycemic profile overall.  His previsit A1c on 05/02/20 was 8.3%, improving from previous 9.5%.  He has cut back on his carb intake.  He denies any hypoglycemia.  -Recent labs reviewed.  - I had a long discussion with him about the progressive nature of diabetes and the pathology behind its complications. -his diabetes is complicated by TIA, MI, CHF, CAD, CKD and he remains at a high risk for more acute and chronic complications which include retinopathy, and neuropathy. These are all discussed in detail with him.  - Nutritional counseling repeated at each appointment due to patients tendency to fall back in to old habits.  - The patient admits there  is a room for improvement in their diet and drink choices. -  Suggestion is made for the patient to avoid simple carbohydrates from their diet including Cakes, Sweet Desserts / Pastries, Ice Cream, Soda (diet and regular), Sweet Tea, Candies, Chips, Cookies, Sweet Pastries,  Store Bought Juices, Alcohol in Excess of  1-2 drinks a day, Artificial Sweeteners, Coffee Creamer, and "Sugar-free" Products. This will help patient to have stable blood glucose profile and potentially avoid unintended weight gain.   - I encouraged the patient to switch to  unprocessed  or minimally processed complex starch and increased protein intake (animal or plant source), fruits, and vegetables.   - Patient is advised to stick to a routine mealtimes to eat 3 meals  a day and avoid unnecessary snacks ( to snack only to correct hypoglycemia).  - I have approached him with the following individualized plan to manage  his diabetes and patient agrees:   Avoiding hypoglycemia is the #1 priority in his care given his age and comorbidities.  An appropriate A1c goal for him would be 7.5-8%.  -he is encouraged to continue monitoring blood glucose twice daily, before breakfast and before bed, and to call the clinic if he has readings less than 70 or greater than 300 for 3 tests in a row.  - he is advised to continue Tradjenta 5 mg po daily, therapeutically suitable for patient . -He is advised to continue dose of Glipizide to 10 mg XL daily with breakfast.  - he is not a candidate for Metformin, SGLT2 inhibitors due to concurrent renal insufficiency.  -If he cannot gain control of diabetes with Tradjenta and Glipizide along with diet and exercise, may need to consider adding basal insulin, which is something he would like to avoid if possible.  - Specific targets for  A1c;  LDL, HDL,  and Triglycerides were discussed with the patient.  2) Blood Pressure /Hypertension:  his blood pressure is controlled to target.   he is advised to continue his current medications including Coreg 12.5 mg po twice daily, Lasix 40 mg po daily, and Entresto 24-26 mg po daily.  3) Lipids/Hyperlipidemia:    His most recent lipid panel from 05/02/20 shows controlled LDL of 88 and elevated triglycerides of 209.  He is advised to continue Pravastatin 40 mg po daily at bedtime, Fish Oil, Fenofibrate, and Zetia.  Side effects and precautions discussed with him.   4)  Weight/Diet:  his Body mass index is 24.86 kg/m.  - He is not a candidate for weight loss. Exercise, and detailed carbohydrates information  provided  -  detailed on discharge instructions.  5) Chronic Care/Health Maintenance: -he is on Statin medications and is encouraged to initiate and continue to follow up with Ophthalmology, Dentist, Podiatrist at least yearly or according to recommendations, and advised to stay away from smoking. I have recommended yearly flu vaccine and pneumonia vaccine at least every 5 years; moderate intensity exercise for up to 150 minutes weekly; and sleep for at least 7 hours a day.   - he is advised to maintain close follow up with Adaline Sill, NP for primary care needs, as well as his other providers for optimal and coordinated care.     I spent 30 minutes in the care of the patient today including review of labs from [place], Lipids, Thyroid Function, Hematology (current and previous including abstractions from other facilities); face-to-face time discussing  his blood glucose readings/logs, discussing hypoglycemia and hyperglycemia episodes and symptoms, medications  doses, his options of short and long term treatment based on the latest standards of care / guidelines;  discussion about incorporating lifestyle medicine;  and documenting the encounter.    Please refer to Patient Instructions for Blood Glucose Monitoring and Insulin/Medications Dosing Guide"  in media tab for additional information. Please  also refer to " Patient Self Inventory" in the Media  tab for reviewed elements of pertinent patient history.  Wynn Maudlin participated in the discussions, expressed understanding, and voiced agreement with the above plans.  All questions were answered to his satisfaction. he is encouraged to contact clinic should he have any questions or concerns prior to his return visit.  Follow up plan: - Return in about 4 months (around 10/26/2020) for Diabetes F/U- A1c and urine micro in office, No previsit labs, Bring meter and logs.  Rayetta Pigg, [facility] [address] [place], [place] [address] Phone: [phone] Fax: [phone]  06/25/2020, 2:35 PM

## 2020-06-25 NOTE — Patient Instructions (Signed)

## 2020-06-26 ENCOUNTER — Ambulatory Visit: Payer: TRICARE For Life (TFL) | Admitting: Nurse Practitioner

## 2020-08-10 ENCOUNTER — Other Ambulatory Visit: Payer: Self-pay

## 2020-08-10 ENCOUNTER — Encounter: Payer: Self-pay | Admitting: Student

## 2020-08-10 ENCOUNTER — Ambulatory Visit (INDEPENDENT_AMBULATORY_CARE_PROVIDER_SITE_OTHER): Payer: Medicare Other | Admitting: Student

## 2020-08-10 VITALS — BP 118/72 | HR 76 | Ht 66.0 in | Wt 149.0 lb

## 2020-08-10 DIAGNOSIS — I2510 Atherosclerotic heart disease of native coronary artery without angina pectoris: Secondary | ICD-10-CM | POA: Diagnosis not present

## 2020-08-10 DIAGNOSIS — I5042 Chronic combined systolic (congestive) and diastolic (congestive) heart failure: Secondary | ICD-10-CM | POA: Diagnosis not present

## 2020-08-10 DIAGNOSIS — N1832 Chronic kidney disease, stage 3b: Secondary | ICD-10-CM

## 2020-08-10 DIAGNOSIS — I4821 Permanent atrial fibrillation: Secondary | ICD-10-CM | POA: Diagnosis not present

## 2020-08-10 DIAGNOSIS — I779 Disorder of arteries and arterioles, unspecified: Secondary | ICD-10-CM

## 2020-08-10 NOTE — Progress Notes (Signed)
Cardiology Office Note    Date:  08/11/2020   ID:  Oscar [name], DOB 03-25-1940, MRN [id number]  PCP:  Adaline Sill, NP  Cardiologist: Minus Breeding, MD    Chief Complaint  Patient presents with   Follow-up    6 month visit    History of Present Illness:    Oscar [name] is a 80 y.o. male with past medical history of HFrEF (EF 25-30% by echo in 07/2018, at 35-40% by repeat echo in 11/2018), CAD (catheterization in 07/2018 showing 100% Proximal-RCA stenosis, 99% Proximal-LAD, 75% Ost LM, 70% LCx, 40% PDA, and 30% Ost LCx with patient declining CABG along with declining arthrectomy with Impella support due to high-risk status), permanent atrial fibrillation, carotid artery stenosis (s/p L CEA in 2007 and R CEA in 07/2018), HTN and HLD who presents to the office today for 60-month follow-up.   He was last examined by Dr. Percival Spanish in 02/2020 and denied any new chest pain or dyspnea on exertion. He weight had been stable and he did not appear volume overloaded by examination. It was felt he would not tolerate further adjustments in his medications due to his BP but was recommended to consider dose adjustment of Entresto at his next visit if BP allowed.   In talking with the patient today, he reports overall doing well since his last office visit. He remains active at baseline and still walks around the grocery store or [place] for exercise and also stays active in doing yard work. He reports his breathing has overall been stable and denies any exertional chest pain. No recent orthopnea, PND or pitting edema.  He has made dietary changes and is now closely monitoring the sodium in his diet along with limiting carbohydrates. Appears Jardiance was discontinued by Endocrinology in the interm due to his variable renal function.   Past Medical History:  Diagnosis Date   Anemia    Atrial fibrillation (HCC)    permanent   CAD (coronary artery disease)    a. cath on 07/29/2018 showed 100%  Proximal-RCA stenosis, 99% Proximal-LAD, 75% Ost LM, 70% LCx, 40% PDA, and 30% Ost LCx. Declined CABG   Cardiomyopathy    EF 35% in the past--Improved to 55% echo 2/10. Moderately severe mitral regurgitation in the past, now improved,   Carotid artery stenosis    CHF (congestive heart failure) ([place]) 2002   COPD (chronic obstructive pulmonary disease) ([place])    Coronary artery disease    non obstructive   Diabetes mellitus    Dyslipidemia    History of tobacco abuse    Nephrolithiasis    Tremors of nervous system     Past Surgical History:  Procedure Laterality Date   CAROTID ENDARTERECTOMY Left 10-21-05   cea   CATARACT EXTRACTION W/PHACO Left 09/10/2018   Procedure: CATARACT EXTRACTION PHACO AND INTRAOCULAR LENS PLACEMENT LEFT EYE;  Surgeon: Baruch Goldmann, MD;  Location: [facility];  Service: Ophthalmology;  Laterality: Left;  left   CATARACT EXTRACTION W/PHACO Right 11/12/2018   Procedure: CATARACT EXTRACTION PHACO AND INTRAOCULAR LENS PLACEMENT RIGHT EYE;  Surgeon: Baruch Goldmann, MD;  Location: [facility];  Service: Ophthalmology;  Laterality: Right;  right   ENDARTERECTOMY Right 08/11/2018   Procedure: ENDARTERECTOMY CAROTID RIGHT;  Surgeon: [name], MD;  Location: [place];  Service: Vascular;  Laterality: Right;   left carotid endarterectomy     PATCH ANGIOPLASTY Right 08/11/2018   Procedure: PATCH ANGIOPLAST USING HEMASHIELD PLATINUM FINESSE;  Surgeon: [name],  MD;  Location: [facility];  Service: Vascular;  Laterality: Right;   RIGHT/LEFT HEART CATH AND CORONARY ANGIOGRAPHY N/A 07/29/2018   Procedure: RIGHT/LEFT HEART CATH AND CORONARY ANGIOGRAPHY;  Surgeon: Jolaine Artist, MD;  Location: [place] [facility];  Service: Cardiovascular;  Laterality: N/A;   TONSILLECTOMY      Current Medications: Outpatient Medications Prior to Visit  Medication Sig Dispense Refill   acetaminophen (TYLENOL) 325 MG tablet Take 2 tablets (650 mg total) by mouth every 6 (six) hours as needed for mild  pain (or Fever >/= 101). 30 tablet 0   albuterol (PROAIR HFA) 108 (90 Base) MCG/ACT inhaler Inhale 2 puffs into the lungs every 6 (six) hours as needed for wheezing or shortness of breath.     aspirin EC 81 MG EC tablet Take 1 tablet (81 mg total) by mouth daily. 30 tablet 0   carvedilol (COREG) 12.5 MG tablet Take 1 tablet (12.5 mg total) by mouth 2 (two) times daily. 180 tablet 3   digoxin (LANOXIN) 0.125 MG tablet Take 1 tablet (125 mcg total) by mouth daily. 90 tablet 3   ENTRESTO 24-26 MG TAKE 1 TABLET TWICE A DAY 180 tablet 3   ezetimibe (ZETIA) 10 MG tablet Take 10 mg by mouth daily.     fenofibrate 160 MG tablet Take 160 mg by mouth daily.     furosemide (LASIX) 40 MG tablet TAKE 1 TABLET DAILY (MAINTENANCE DOSE IS NOW 40 MG DAILY) 90 tablet 3   glipiZIDE (GLIPIZIDE XL) 10 MG 24 hr tablet Take 1 tablet (10 mg total) by mouth daily with breakfast. 90 tablet 3   ipratropium (ATROVENT) 0.02 % nebulizer solution Take 2.5 mLs (0.5 mg dose) by nebulization 2 (two) times daily.     linagliptin (TRADJENTA) 5 MG TABS tablet Take 1 tablet (5 mg total) by mouth daily. 90 tablet 3   Multiple Vitamin (MULTIVITAMIN) tablet Take 1 tablet by mouth daily.     Omega-3 Fatty Acids (FISH OIL) 1200 MG CAPS Take 1,000 mg by mouth daily.      pantoprazole (PROTONIX) 40 MG tablet Take 1 tablet (40 mg total) by mouth daily. 30 tablet 0   pravastatin (PRAVACHOL) 40 MG tablet Take 80 mg by mouth daily.      UNABLE TO FIND Beet root     warfarin (COUMADIN) 7.5 MG tablet Take 1 tablet (7.5 mg total) by mouth daily.     spironolactone (ALDACTONE) 25 MG tablet Take 1 tablet (25 mg total) by mouth daily. 30 tablet 6   No facility-administered medications prior to visit.     Allergies:   Penicillins, Statins, and Spironolactone   Social History   Socioeconomic History   Marital status: Married    Spouse name: Not on file   Number of children: 5   Years of education: 12   Highest education level: Not on file   Occupational History   Occupation: Retired  Tobacco Use   Smoking status: Former    Pack years: 0.00    Types: Cigarettes    Quit date: 02/25/2000    Years since quitting: 20.4   Smokeless tobacco: Former  Scientific laboratory technician Use: Never used  Substance and Sexual Activity   Alcohol use: No   Drug use: No   Sexual activity: Not on file  Other Topics Concern   Not on file  Social History Narrative   Lives at home with his wife.   Right-handed.   3-4 cups caffeine  per day.   Social Determinants of Health   Financial Resource Strain: Not on file  Food Insecurity: Not on file  Transportation Needs: Not on file  Physical Activity: Not on file  Stress: Not on file  Social Connections: Not on file     Family History:  The patient's family history includes Coronary artery disease in his mother and another family member; Deep vein thrombosis in his son; Diabetes in his daughter and mother; Heart attack in his brother, mother, sister, and son; Heart disease in his brother, mother, and sister; Heart disease (age of onset: 32) in his son; Hyperlipidemia in his brother; Hypertension in his brother, daughter, mother, and sister; Other in his brother, daughter, and mother.   Review of Systems:    Please see the history of present illness.     All other systems reviewed and are otherwise negative except as noted above.   Physical Exam:    VS:  BP 118/72   Pulse 76   Ht 5\' 6"  (1.676 m)   Wt 149 lb (67.6 kg)   SpO2 98%   BMI 24.05 kg/m    General: Well developed, well nourished,male appearing in no acute distress. Head: Normocephalic, atraumatic. Neck: No carotid bruits. JVD not elevated.  Lungs: Respirations regular and unlabored, without wheezes or rales.  Heart: Irregularly irregular. No S3 or S4.  2/6 SEM along RUSB.  Abdomen: Appears non-distended. No obvious abdominal masses. Msk:  Strength and tone appear normal for age. No obvious joint deformities or  effusions. Extremities: No clubbing or cyanosis. No pitting edema.  Distal pedal pulses are 2+ bilaterally. Neuro: Alert and oriented X 3. Moves all extremities spontaneously. No focal deficits noted. Psych:  Responds to questions appropriately with a normal affect. Skin: No rashes or lesions noted  Wt Readings from Last 3 Encounters:  08/10/20 149 lb (67.6 kg)  06/25/20 154 lb (69.9 kg)  03/29/20 149 lb 9.6 oz (67.9 kg)     Studies/Labs Reviewed:   EKG:  EKG is not ordered today.    Recent Labs: 05/02/2020: ALT 28; BUN 24; Creatinine 1.1; Potassium 4.8; Sodium 134   Lipid Panel    Component Value Date/Time   CHOL 152 05/02/2020 0000   TRIG 209 (A) 05/02/2020 0000   HDL 28 (A) 05/02/2020 0000   CHOLHDL 5.6 07/25/2018 0443   VLDL 23 07/25/2018 0443   LDLCALC 88 05/02/2020 0000    Additional studies/ records that were reviewed today include:   Cardiac Catheterization: 07/2018 Prox RCA lesion is 100% stenosed. Prox LAD lesion is 99% stenosed. Ost LM lesion is 75% stenosed. Prox Cx to Mid Cx lesion is 70% stenosed. LPDA lesion is 40% stenosed. Ost Cx to Prox Cx lesion is 30% stenosed.   Findings:   Ao = 148/81 (109) LV = 13215 RA = 7 RV = 59/6 PA = 59/24 (37) PCW = 22 Fick cardiac output/index = 4.1/2.3 PVR = 3.3 WU Ao sat = 93% PA sat = 62%, 64%   Assessment: 1. Severe 3v CAD in left dominant system with 70-80% LM lesion and 99% proximal LAD lesion 2. Ischemic CM EF 25% 3. Mild to moderate pulmonary HTN 4. Moderately reduced CO   Plan/Discussion:   Will d/w with TCTS regarding possible CABG and [place]. If not surgical candidate will need PCI of LAD with atherectomy +/- LM PCI.  Echocardiogram: 11/2018 IMPRESSIONS     1. Left ventricular ejection fraction, by visual estimation, is 35 to  40%. The left  ventricle has normal function. Mildly increased left  ventricular size. There is mildly increased left ventricular hypertrophy.   2. Septal apical and  inferior wall hypokinesis.   3. Global right ventricle has normal systolic function.The right  ventricular size is normal. No increase in right ventricular wall  thickness.   4. Left atrial size was moderately dilated.   5. Right atrial size was moderately dilated.   6. Moderate calcification of the mitral valve leaflet(s).   7. Moderate mitral annular calcification.   8. Moderate thickening of the mitral valve leaflet(s).   9. The mitral valve is normal in structure. Mild mitral valve  regurgitation. No evidence of mitral stenosis.  10. The tricuspid valve is normal in structure. Tricuspid valve  regurgitation is mild.  11. The aortic valve is tricuspid Aortic valve regurgitation is mild by  color flow Doppler. Mild aortic valve sclerosis without stenosis.  12. The pulmonic valve was normal in structure. Pulmonic valve  regurgitation is mild by color flow Doppler.  13. Moderately elevated pulmonary artery systolic pressure.  14. The inferior vena cava is normal in size with greater than 50%  respiratory variability, suggesting right atrial pressure of 3 mmHg.   Assessment:    1. Chronic combined systolic and diastolic heart failure ([place])   2. Coronary artery disease involving native coronary artery of native heart without angina pectoris   3. Permanent atrial fibrillation ([place])   4. Bilateral carotid artery disease, unspecified type ([place])   5. Stage 3b chronic kidney disease ([place])      Plan:   In order of problems listed above:  1. HFrEF - His EF was at 25-30% by echo in 07/2018, at 35-40% by repeat echo in 11/2018. - He is overall doing very well and denies any orthopnea, PND or lower extremity edema. Now watching his sodium more closely. He appears euvolemic by examination today.  - Continue current medication regimen with Coreg 12.5mg  BID, Digoxin 0.125mg  daily, Lasix 40mg  daily and Entresto 24-26mg  BID. Previously had hyperkalemia with Spironolactone. No longer on Jardiance  as this was discontinued by Endocrinology in the interim due to his variable renal function (creatinine at 1.75 in 2020, at 1.5 in 01/2020 and 1.65 in 05/2020 per Care Everywhere).   2. CAD - Prior catheterization in 07/2018 showed 100% Proximal-RCA stenosis, 99% Proximal-LAD, 75% Ost LM, 70% LCx, 40% PDA, and 30% Ost LCx with patient declining CABG along with declining arthrectomy with Impella support due to high-risk status. - Thankfully, he denies any recent anginal symptoms.  - Continue ASA, Coreg, Pravastatin and Zetia. He does not have an Rx for SL NTG and offered to write for it today but he declined due to no recent symptoms. Encouraged him to call back if wanting an Rx for NTG.   3. Permanent Atrial Fibrillation - He denies any recent palpitations and his HR is well-controlled today. Continue Coreg 12.5mg  BID for rate-control. - He remains on Coumadin for anticoagulation which is followed by his PCP.   4. Carotid Artery Stenosis - He is s/p L CEA in 2007 and R CEA in 07/2018. Last carotid dopplers were in 2020. Will arrange for repeat dopplers.   5. Stage 3 CKD - Creatinine was at 1.65 in 05/2020. Followed by Dr. Theador Hawthorne.     Medication Adjustments/Labs and Tests Ordered: Current medicines are reviewed at length with the patient today.  Concerns regarding medicines are outlined above.  Medication changes, Labs and Tests ordered today are listed in the Patient  Instructions below. Patient Instructions  Medication Instructions:  Your physician recommends that you continue on your current medications as directed. Please refer to the Current Medication list given to you today.  *If you need a refill on your cardiac medications before your next appointment, please call your pharmacy*   Lab Work: None today  If you have labs (blood work) drawn today and your tests are completely normal, you will receive your results only by: [place] (if you have MyChart) OR A paper copy in  the mail If you have any lab test that is abnormal or we need to change your treatment, we will call you to review the results.   Testing/Procedures: Your physician has requested that you have a carotid duplex. This test is an ultrasound of the carotid arteries in your neck. It looks at blood flow through these arteries that supply the brain with blood. Allow one hour for this exam. There are no restrictions or special instructions.    Follow-Up: At [facility], you and your health needs are our priority.  As part of our continuing mission to provide you with exceptional heart care, we have created designated Provider Care Teams.  These Care Teams include your primary Cardiologist (physician) and Advanced Practice Providers (APPs -  Physician Assistants and Nurse Practitioners) who all work together to provide you with the care you need, when you need it.  We recommend signing up for the patient portal called "MyChart".  Sign up information is provided on this After Visit Summary.  MyChart is used to connect with patients for Virtual Visits (Telemedicine).  Patients are able to view lab/test results, encounter notes, upcoming appointments, etc.  Non-urgent messages can be sent to your provider as well.   To learn more about what you can do with MyChart, go to [email or web address].    Your next appointment:   6 month(s)  The format for your next appointment:   In Person  Provider:   Bernerd Pho, PA-C   Other Instructions None      Signed, Erma Heritage, PA-C  08/11/2020 8:15 AM    [facility]. [address] [place], [place] [address] Phone: [phone] Fax: [phone]

## 2020-08-10 NOTE — Patient Instructions (Signed)
Medication Instructions:  Your physician recommends that you continue on your current medications as directed. Please refer to the Current Medication list given to you today.  *If you need a refill on your cardiac medications before your next appointment, please call your pharmacy*   Lab Work: None today  If you have labs (blood work) drawn today and your tests are completely normal, you will receive your results only by: [place] (if you have MyChart) OR A paper copy in the mail If you have any lab test that is abnormal or we need to change your treatment, we will call you to review the results.   Testing/Procedures: Your physician has requested that you have a carotid duplex. This test is an ultrasound of the carotid arteries in your neck. It looks at blood flow through these arteries that supply the brain with blood. Allow one hour for this exam. There are no restrictions or special instructions.    Follow-Up: At [facility], you and your health needs are our priority.  As part of our continuing mission to provide you with exceptional heart care, we have created designated Provider Care Teams.  These Care Teams include your primary Cardiologist (physician) and Advanced Practice Providers (APPs -  Physician Assistants and Nurse Practitioners) who all work together to provide you with the care you need, when you need it.  We recommend signing up for the patient portal called "MyChart".  Sign up information is provided on this After Visit Summary.  MyChart is used to connect with patients for Virtual Visits (Telemedicine).  Patients are able to view lab/test results, encounter notes, upcoming appointments, etc.  Non-urgent messages can be sent to your provider as well.   To learn more about what you can do with MyChart, go to [email or web address].    Your next appointment:   6 month(s)  The format for your next appointment:   In Person  Provider:   Bernerd Pho,  PA-C   Other Instructions None

## 2020-08-11 ENCOUNTER — Encounter: Payer: Self-pay | Admitting: Student

## 2020-08-13 ENCOUNTER — Encounter: Payer: Self-pay | Admitting: Student

## 2020-09-04 ENCOUNTER — Ambulatory Visit (INDEPENDENT_AMBULATORY_CARE_PROVIDER_SITE_OTHER): Payer: Medicare Other

## 2020-09-04 DIAGNOSIS — I2510 Atherosclerotic heart disease of native coronary artery without angina pectoris: Secondary | ICD-10-CM | POA: Diagnosis not present

## 2020-09-04 DIAGNOSIS — I6522 Occlusion and stenosis of left carotid artery: Secondary | ICD-10-CM | POA: Diagnosis not present

## 2020-09-05 ENCOUNTER — Telehealth: Payer: Self-pay

## 2020-09-05 NOTE — Telephone Encounter (Signed)
-----   Message from Erma Heritage, [place] sent at 09/05/2020  7:43 AM EDT ----- Please let the patient know that his carotid dopplers showed less than 1-39% stenosis bilaterally. Reassuring given his prior carotid surgeries.

## 2020-09-05 NOTE — Telephone Encounter (Signed)
Pt notified and verbalized understanding. Pt had no questions or concerns at this time.  

## 2020-10-19 LAB — BASIC METABOLIC PANEL
BUN: 41 — AB (ref 4–21)
CO2: 29 — AB (ref 13–22)
Chloride: 100 (ref 99–108)
Creatinine: 1.4 — AB (ref <=1.3)
Glucose: 227
Potassium: 4.4 (ref 3.4–5.3)
Sodium: 137 (ref 137–147)

## 2020-10-19 LAB — COMPREHENSIVE METABOLIC PANEL
Albumin: 4.3 (ref 3.5–5.0)
Calcium: 9.5 (ref 8.7–10.7)

## 2020-10-19 LAB — CBC AND DIFFERENTIAL
HCT: 45 (ref 41–53)
Hemoglobin: 14.5 (ref 13.5–17.5)
Platelets: 342 (ref 150–399)
WBC: 9.9

## 2020-10-19 LAB — CBC: RBC: 4.92 (ref 3.87–5.11)

## 2020-10-30 ENCOUNTER — Other Ambulatory Visit: Payer: Self-pay

## 2020-10-30 ENCOUNTER — Encounter: Payer: Self-pay | Admitting: Nurse Practitioner

## 2020-10-30 ENCOUNTER — Ambulatory Visit (INDEPENDENT_AMBULATORY_CARE_PROVIDER_SITE_OTHER): Payer: Medicare Other | Admitting: Nurse Practitioner

## 2020-10-30 VITALS — BP 117/73 | HR 79 | Ht 66.0 in | Wt 148.4 lb

## 2020-10-30 DIAGNOSIS — E1122 Type 2 diabetes mellitus with diabetic chronic kidney disease: Secondary | ICD-10-CM

## 2020-10-30 DIAGNOSIS — N1831 Chronic kidney disease, stage 3a: Secondary | ICD-10-CM

## 2020-10-30 LAB — POCT GLYCOSYLATED HEMOGLOBIN (HGB A1C): Hemoglobin A1C: 6.9 % — AB (ref 4.0–5.6)

## 2020-10-30 NOTE — Progress Notes (Signed)
Endocrinology Follow Up Note       10/30/2020, 2:49 PM   Subjective:    Patient ID: Oscar [name], male    DOB: 1941/02/10.  Oscar [name] is being seen in follow up after being seen in consultation for management of currently uncontrolled symptomatic diabetes requested by  Adaline Sill, NP.   Past Medical History:  Diagnosis Date   Anemia    Atrial fibrillation (HCC)    permanent   CAD (coronary artery disease)    a. cath on 07/29/2018 showed 100% Proximal-RCA stenosis, 99% Proximal-LAD, 75% Ost LM, 70% LCx, 40% PDA, and 30% Ost LCx. Declined CABG   Cardiomyopathy    EF 35% in the past--Improved to 55% echo 2/10. Moderately severe mitral regurgitation in the past, now improved,   Carotid artery stenosis    CHF (congestive heart failure) ([place]) 2002   COPD (chronic obstructive pulmonary disease) ([place])    Coronary artery disease    non obstructive   Diabetes mellitus    Dyslipidemia    History of tobacco abuse    Nephrolithiasis    Tremors of nervous system     Past Surgical History:  Procedure Laterality Date   CAROTID ENDARTERECTOMY Left 10-21-05   cea   CATARACT EXTRACTION W/PHACO Left 09/10/2018   Procedure: CATARACT EXTRACTION PHACO AND INTRAOCULAR LENS PLACEMENT LEFT EYE;  Surgeon: Baruch Goldmann, MD;  Location: [facility];  Service: Ophthalmology;  Laterality: Left;  left   CATARACT EXTRACTION W/PHACO Right 11/12/2018   Procedure: CATARACT EXTRACTION PHACO AND INTRAOCULAR LENS PLACEMENT RIGHT EYE;  Surgeon: Baruch Goldmann, MD;  Location: [facility];  Service: Ophthalmology;  Laterality: Right;  right   ENDARTERECTOMY Right 08/11/2018   Procedure: ENDARTERECTOMY CAROTID RIGHT;  Surgeon: [name], MD;  Location: [facility] OR;  Service: Vascular;  Laterality: Right;   left carotid endarterectomy     PATCH ANGIOPLASTY Right 08/11/2018   Procedure: PATCH ANGIOPLAST USING HEMASHIELD PLATINUM FINESSE;  Surgeon:  [name], MD;  Location: [facility];  Service: Vascular;  Laterality: Right;   RIGHT/LEFT HEART CATH AND CORONARY ANGIOGRAPHY N/A 07/29/2018   Procedure: RIGHT/LEFT HEART CATH AND CORONARY ANGIOGRAPHY;  Surgeon: Jolaine Artist, MD;  Location: [facility];  Service: Cardiovascular;  Laterality: N/A;   TONSILLECTOMY      Social History   Socioeconomic History   Marital status: Married    Spouse name: Not on file   Number of children: 5   Years of education: 12   Highest education level: Not on file  Occupational History   Occupation: Retired  Tobacco Use   Smoking status: Former    Types: Cigarettes    Quit date: 02/25/2000    Years since quitting: 20.6   Smokeless tobacco: Former  Scientific laboratory technician Use: Never used  Substance and Sexual Activity   Alcohol use: No   Drug use: No   Sexual activity: Not on file  Other Topics Concern   Not on file  Social History Narrative   Lives at home with his wife.   Right-handed.   3-4 cups caffeine per day.   Social Determinants of Radio broadcast assistant  Strain: Not on file  Food Insecurity: Not on file  Transportation Needs: Not on file  Physical Activity: Not on file  Stress: Not on file  Social Connections: Not on file    Family History  Problem Relation Age of Onset   Coronary artery disease Mother    Diabetes Mother    Heart disease Mother        Before age 52 and  CHF   Hypertension Mother    Other Mother        varicose veins   Heart attack Mother    Heart disease Sister    Hypertension Sister    Heart attack Sister    Heart disease Brother    Hyperlipidemia Brother    Hypertension Brother    Other Brother        varicose veins   Heart attack Brother    Heart disease Son 42       Heart Disease before age 18- Open hear surgery   Deep vein thrombosis Son    Heart attack Son    Coronary artery disease Other    Diabetes Daughter    Hypertension Daughter    Other Daughter        varicose veins     Outpatient Encounter Medications as of 10/30/2020  Medication Sig   acetaminophen (TYLENOL) 325 MG tablet Take 2 tablets (650 mg total) by mouth every 6 (six) hours as needed for mild pain (or Fever >/= 101).   albuterol (PROAIR HFA) 108 (90 Base) MCG/ACT inhaler Inhale 2 puffs into the lungs every 6 (six) hours as needed for wheezing or shortness of breath.   aspirin EC 81 MG EC tablet Take 1 tablet (81 mg total) by mouth daily.   carvedilol (COREG) 12.5 MG tablet Take 1 tablet (12.5 mg total) by mouth 2 (two) times daily.   digoxin (LANOXIN) 0.125 MG tablet Take 1 tablet (125 mcg total) by mouth daily.   ENTRESTO 24-26 MG TAKE 1 TABLET TWICE A DAY   ezetimibe (ZETIA) 10 MG tablet Take 10 mg by mouth daily.   fenofibrate 160 MG tablet Take 160 mg by mouth daily.   furosemide (LASIX) 40 MG tablet TAKE 1 TABLET DAILY (MAINTENANCE DOSE IS NOW 40 MG DAILY)   glipiZIDE (GLIPIZIDE XL) 10 MG 24 hr tablet Take 1 tablet (10 mg total) by mouth daily with breakfast.   ipratropium (ATROVENT) 0.02 % nebulizer solution Take 2.5 mLs (0.5 mg dose) by nebulization 2 (two) times daily.   linagliptin (TRADJENTA) 5 MG TABS tablet Take 1 tablet (5 mg total) by mouth daily.   Multiple Vitamin (MULTIVITAMIN) tablet Take 1 tablet by mouth daily.   Omega-3 Fatty Acids (FISH OIL) 1200 MG CAPS Take 1,000 mg by mouth daily.    pantoprazole (PROTONIX) 40 MG tablet Take 1 tablet (40 mg total) by mouth daily.   pravastatin (PRAVACHOL) 40 MG tablet Take 80 mg by mouth daily.    UNABLE TO FIND Beet root   warfarin (COUMADIN) 7.5 MG tablet Take 1 tablet (7.5 mg total) by mouth daily.   No facility-administered encounter medications on file as of 10/30/2020.    ALLERGIES: Allergies  Allergen Reactions   Penicillins Hives, Other (See Comments) and Rash    Blisters on hands Blisters on hands  Blisters on hands Blisters on hands Blisters on hands Blisters on hands   Other Other (See Comments)    Joint and muscle  pain and low energy. Joint and muscle pain and  low energy.   Statins Other (See Comments)    Joint and muscle pain and low energy.   Spironolactone     Hyperkalemia Hyperkalemia Hyperkalemia    VACCINATION STATUS:  There is no immunization history on file for this patient.  Diabetes He presents for his follow-up diabetic visit. He has type 2 diabetes mellitus. Onset time: Diagnosed at approx age of 70. His disease course has been improving. There are no hypoglycemic associated symptoms. Pertinent negatives for diabetes include no blurred vision, no fatigue and no foot paresthesias. There are no hypoglycemic complications. Symptoms are improving. Diabetic complications include a CVA, heart disease and nephropathy. Risk factors for coronary artery disease include diabetes mellitus, dyslipidemia, hypertension, male sex and sedentary lifestyle. Current diabetic treatment includes oral agent (dual therapy). He is compliant with treatment most of the time. His weight is stable. He is following a generally healthy diet. When asked about meal planning, he reported none. He has not had a previous visit with a dietitian. He rarely participates in exercise. His home blood glucose trend is decreasing steadily. His breakfast blood glucose range is generally 90-110 mg/dl. His bedtime blood glucose range is generally 140-180 mg/dl. (He presents today with his meter and logs showing at goal fasting and postprandial glycemic profile.  His POCT A1c today is 6.9%, improving from last visit of 8.7%.  He denies any significant hypoglycemia.) An ACE inhibitor/angiotensin II receptor blocker is contraindicated. He does not see a podiatrist.Eye exam is current.  Hypertension This is a chronic problem. The current episode started more than 1 year ago. The problem has been resolved since onset. The problem is controlled. Pertinent negatives include no blurred vision. There are no associated agents to hypertension. Risk  factors for coronary artery disease include diabetes mellitus, dyslipidemia, male gender and sedentary lifestyle. Past treatments include beta blockers and diuretics. The current treatment provides moderate improvement. There are no compliance problems.  Hypertensive end-organ damage includes kidney disease, CAD/MI, CVA and heart failure. Identifiable causes of hypertension include chronic renal disease and renovascular disease.  Hyperlipidemia This is a chronic problem. The current episode started more than 1 year ago. The problem is controlled. Recent lipid tests were reviewed and are normal. Exacerbating diseases include chronic renal disease, diabetes and nephrotic syndrome. Factors aggravating his hyperlipidemia include beta blockers and fatty foods. Current antihyperlipidemic treatment includes statins. There are no compliance problems.  Risk factors for coronary artery disease include diabetes mellitus, dyslipidemia, hypertension, male sex and a sedentary lifestyle.    Review of systems  Constitutional: + Minimally fluctuating body weight,  current Body mass index is 23.95 kg/m. , no fatigue, no subjective hyperthermia, no subjective hypothermia Eyes: no blurry vision, no xerophthalmia ENT: no sore throat, no nodules palpated in throat, no dysphagia/odynophagia, no hoarseness Cardiovascular: no chest pain, no shortness of breath, no palpitations, no leg swelling Respiratory: no cough, no shortness of breath Gastrointestinal: no nausea/vomiting/diarrhea Musculoskeletal: no muscle/joint aches Skin: no rashes, no hyperemia Neurological: no tremors, no numbness, no tingling, no dizziness Psychiatric: no depression, no anxiety  Objective:     BP 117/73   Pulse 79   Ht '5\' 6"'$  (1.676 m)   Wt 148 lb 6.4 oz (67.3 kg)   BMI 23.95 kg/m   Wt Readings from Last 3 Encounters:  10/30/20 148 lb 6.4 oz (67.3 kg)  08/10/20 149 lb (67.6 kg)  06/25/20 154 lb (69.9 kg)    BP Readings from Last 3  Encounters:  10/30/20 117/73  08/10/20 118/72  06/25/20 137/68     Physical Exam- Limited  Constitutional:  Body mass index is 23.95 kg/m. , not in acute distress, normal state of mind Eyes:  EOMI, no exophthalmos Neck: Supple Cardiovascular: RRR, no murmurs, rubs, or gallops, no edema Respiratory: Adequate breathing efforts, no crackles, rales, rhonchi, or wheezing Musculoskeletal: no gross deformities, strength intact in all four extremities, no gross restriction of joint movements Skin:  no rashes, no hyperemia Neurological: no tremor with outstretched hands    CMP ( most recent) CMP     Component Value Date/Time   NA 134 (A) 05/02/2020 0000   K 4.8 05/02/2020 0000   CL 97 (A) 05/02/2020 0000   CO2 20 05/02/2020 0000   GLUCOSE 274 (H) 08/04/2019 1449   BUN 24 (A) 05/02/2020 0000   CREATININE 1.1 05/02/2020 0000   CREATININE 1.36 (H) 08/04/2019 1449   CALCIUM 9.6 05/02/2020 0000   PROT 6.9 08/04/2019 1449   ALBUMIN 4.3 05/02/2020 0000   AST 30 05/02/2020 0000   ALT 28 05/02/2020 0000   ALKPHOS 51 05/02/2020 0000   BILITOT 0.5 08/04/2019 1449   GFRNONAA 45 02/02/2020 0000   GFRAA 57 (L) 08/04/2019 1449     Diabetic Labs (most recent): Lab Results  Component Value Date   HGBA1C 6.9 (A) 10/30/2020   HGBA1C 8.3 05/02/2020   HGBA1C 9.5 02/02/2020     Lipid Panel ( most recent) Lipid Panel     Component Value Date/Time   CHOL 152 05/02/2020 0000   TRIG 209 (A) 05/02/2020 0000   HDL 28 (A) 05/02/2020 0000   CHOLHDL 5.6 07/25/2018 0443   VLDL 23 07/25/2018 0443   LDLCALC 88 05/02/2020 0000      Lab Results  Component Value Date   TSH 0.997 06/16/2014           Assessment & Plan:   1) Type 2 Diabetes with CKD stage 3a  - Oscar [name] has currently uncontrolled symptomatic type 2 DM since 80 years of age.  He presents today with his meter and logs showing at goal fasting and postprandial glycemic profile.  His POCT A1c today is 6.9%,  improving from last visit of 8.7%.  He denies any significant hypoglycemia.  -Recent labs reviewed.  - I had a long discussion with him about the progressive nature of diabetes and the pathology behind its complications. -his diabetes is complicated by TIA, MI, CHF, CAD, CKD and he remains at a high risk for more acute and chronic complications which include retinopathy, and neuropathy. These are all discussed in detail with him.  - Nutritional counseling repeated at each appointment due to patients tendency to fall back in to old habits.  - The patient admits there is a room for improvement in their diet and drink choices. -  Suggestion is made for the patient to avoid simple carbohydrates from their diet including Cakes, Sweet Desserts / Pastries, Ice Cream, Soda (diet and regular), Sweet Tea, Candies, Chips, Cookies, Sweet Pastries, Store Bought Juices, Alcohol in Excess of 1-2 drinks a day, Artificial Sweeteners, Coffee Creamer, and "Sugar-free" Products. This will help patient to have stable blood glucose profile and potentially avoid unintended weight gain.   - I encouraged the patient to switch to unprocessed or minimally processed complex starch and increased protein intake (animal or plant source), fruits, and vegetables.   - Patient is advised to stick to a routine mealtimes to eat 3 meals a day and avoid unnecessary snacks (to snack only  to correct hypoglycemia).  - I have approached him with the following individualized plan to manage  his diabetes and patient agrees:   Avoiding hypoglycemia is the #1 priority in his care given his age and comorbidities.  An appropriate A1c goal for him would be 7.0-7.5%.  -he is encouraged to continue monitoring blood glucose twice daily, before breakfast and before bed, and to call the clinic if he has readings less than 70 or greater than 300 for 3 tests in a row.  - Based on his stable glycemic control, he is advised to continue Tradjenta 5 mg  po daily and Glipizide 10 mg XL daily with breakfast.    - he is not a candidate for Metformin, SGLT2 inhibitors due to concurrent renal insufficiency.  -If he cannot gain control of diabetes with Tradjenta and Glipizide along with diet and exercise, may need to consider adding basal insulin, which is something he would like to avoid if possible.  - Specific targets for  A1c;  LDL, HDL,  and Triglycerides were discussed with the patient.  2) Blood Pressure /Hypertension:  his blood pressure is controlled to target.   he is advised to continue his current medications including Coreg 12.5 mg po twice daily, Lasix 40 mg po daily, and Entresto 24-26 mg po daily.  3) Lipids/Hyperlipidemia:    His most recent lipid panel from 05/02/20 shows controlled LDL of 88 and elevated triglycerides of 209.  He is advised to continue Pravastatin 40 mg po daily at bedtime, Fish Oil, Fenofibrate, and Zetia.  Side effects and precautions discussed with him.   4)  Weight/Diet:  his Body mass index is 23.95 kg/m.  - He is not a candidate for weight loss. Exercise, and detailed carbohydrates information provided  -  detailed on discharge instructions.  5) Chronic Care/Health Maintenance: -he is on Statin medications and is encouraged to initiate and continue to follow up with Ophthalmology, Dentist, Podiatrist at least yearly or according to recommendations, and advised to stay away from smoking. I have recommended yearly flu vaccine and pneumonia vaccine at least every 5 years; moderate intensity exercise for up to 150 minutes weekly; and sleep for at least 7 hours a day.   - he is advised to maintain close follow up with Adaline Sill, NP for primary care needs, as well as his other providers for optimal and coordinated care.       I spent 30 minutes in the care of the patient today including review of labs from [place], Lipids, Thyroid Function, Hematology (current and previous including abstractions from  other facilities); face-to-face time discussing  his blood glucose readings/logs, discussing hypoglycemia and hyperglycemia episodes and symptoms, medications doses, his options of short and long term treatment based on the latest standards of care / guidelines;  discussion about incorporating lifestyle medicine;  and documenting the encounter.    Please refer to Patient Instructions for Blood Glucose Monitoring and Insulin/Medications Dosing Guide"  in media tab for additional information. Please  also refer to " Patient Self Inventory" in the Media  tab for reviewed elements of pertinent patient history.  Oscar [name] participated in the discussions, expressed understanding, and voiced agreement with the above plans.  All questions were answered to his satisfaction. he is encouraged to contact clinic should he have any questions or concerns prior to his return visit.   Follow up plan: - Return in about 6 months (around 04/29/2021) for Diabetes F/U with A1c in office, No previsit  labs, Bring meter and logs.  Rayetta Pigg, [facility] [address] [place], [place] [address] Phone: [phone] Fax: [phone]  10/30/2020, 2:49 PM

## 2020-11-16 ENCOUNTER — Other Ambulatory Visit: Payer: Self-pay | Admitting: Cardiology

## 2021-03-01 ENCOUNTER — Other Ambulatory Visit: Payer: Self-pay | Admitting: Nurse Practitioner

## 2021-03-01 ENCOUNTER — Other Ambulatory Visit: Payer: Self-pay | Admitting: Cardiology

## 2021-03-12 ENCOUNTER — Ambulatory Visit: Payer: Medicare Other | Admitting: Student

## 2021-03-12 NOTE — Progress Notes (Deleted)
Cardiology Office Note    Date:  03/12/2021   ID:  Oscar [name], DOB 11/26/1940, MRN [id number]  PCP:  Adaline Sill, NP  Cardiologist: Minus Breeding, MD    No chief complaint on file.   History of Present Illness:    Oscar [name] is a 81 y.o. male with past medical history of HFrEF (EF 25-30% by echo in 07/2018, at 35-40% by repeat echo in 11/2018), CAD (catheterization in 07/2018 showing 100% Proximal-RCA stenosis, 99% Proximal-LAD, 75% Ost LM, 70% LCx, 40% PDA, and 30% Ost LCx with patient declining CABG along with declining arthrectomy with Impella support due to high-risk status), permanent atrial fibrillation, carotid artery stenosis (s/p L CEA in 2007 and R CEA in 07/2018), HTN and HLD who presents to the office today for 96-month follow-up.  He was last examined by myself in 07/2020 and was remaining active around his home and denied any recent anginal symptoms.  He was continued on his current cardiac medications including Coreg, Digoxin, Lasix and Entresto as Spironolactone had previously been discontinued due to hyperkalemia and he was no longer on Jardiance per Endocrinology given his variable renal function.    Past Medical History:  Diagnosis Date   Anemia    Atrial fibrillation (HCC)    permanent   CAD (coronary artery disease)    a. cath on 07/29/2018 showed 100% Proximal-RCA stenosis, 99% Proximal-LAD, 75% Ost LM, 70% LCx, 40% PDA, and 30% Ost LCx. Declined CABG   Cardiomyopathy    EF 35% in the past--Improved to 55% echo 2/10. Moderately severe mitral regurgitation in the past, now improved,   Carotid artery stenosis    CHF (congestive heart failure) ([place]) 2002   COPD (chronic obstructive pulmonary disease) ([place])    Coronary artery disease    non obstructive   Diabetes mellitus    Dyslipidemia    History of tobacco abuse    Nephrolithiasis    Tremors of nervous system     Past Surgical History:  Procedure Laterality Date   CAROTID ENDARTERECTOMY  Left 10-21-05   cea   CATARACT EXTRACTION W/PHACO Left 09/10/2018   Procedure: CATARACT EXTRACTION PHACO AND INTRAOCULAR LENS PLACEMENT LEFT EYE;  Surgeon: Baruch Goldmann, MD;  Location: [facility];  Service: Ophthalmology;  Laterality: Left;  left   CATARACT EXTRACTION W/PHACO Right 11/12/2018   Procedure: CATARACT EXTRACTION PHACO AND INTRAOCULAR LENS PLACEMENT RIGHT EYE;  Surgeon: Baruch Goldmann, MD;  Location: [facility];  Service: Ophthalmology;  Laterality: Right;  right   ENDARTERECTOMY Right 08/11/2018   Procedure: ENDARTERECTOMY CAROTID RIGHT;  Surgeon: [name], MD;  Location: [facility];  Service: Vascular;  Laterality: Right;   left carotid endarterectomy     PATCH ANGIOPLASTY Right 08/11/2018   Procedure: PATCH ANGIOPLAST USING HEMASHIELD PLATINUM FINESSE;  Surgeon: [name], MD;  Location: [facility];  Service: Vascular;  Laterality: Right;   RIGHT/LEFT HEART CATH AND CORONARY ANGIOGRAPHY N/A 07/29/2018   Procedure: RIGHT/LEFT HEART CATH AND CORONARY ANGIOGRAPHY;  Surgeon: Jolaine Artist, MD;  Location: [facility];  Service: Cardiovascular;  Laterality: N/A;   TONSILLECTOMY      Current Medications: Outpatient Medications Prior to Visit  Medication Sig Dispense Refill   acetaminophen (TYLENOL) 325 MG tablet Take 2 tablets (650 mg total) by mouth every 6 (six) hours as needed for mild pain (or Fever >/= 101). 30 tablet 0   albuterol (PROAIR HFA) 108 (90 Base) MCG/ACT inhaler Inhale 2 puffs into the lungs  every 6 (six) hours as needed for wheezing or shortness of breath.     aspirin EC 81 MG EC tablet Take 1 tablet (81 mg total) by mouth daily. 30 tablet 0   carvedilol (COREG) 12.5 MG tablet TAKE 1 TABLET TWICE A DAY 180 tablet 0   digoxin (LANOXIN) 0.125 MG tablet Take 1 tablet (125 mcg total) by mouth daily. 90 tablet 3   ENTRESTO 24-26 MG TAKE 1 TABLET TWICE A DAY 180 tablet 2   ezetimibe (ZETIA) 10 MG tablet Take 10 mg by mouth daily.     fenofibrate 160 MG tablet Take 160 mg  by mouth daily.     furosemide (LASIX) 40 MG tablet TAKE 1 TABLET DAILY (MAINTENANCE DOSE IS NOW 40 MG DAILY) 90 tablet 3   glipiZIDE (GLIPIZIDE XL) 10 MG 24 hr tablet Take 1 tablet (10 mg total) by mouth daily with breakfast. 90 tablet 3   ipratropium (ATROVENT) 0.02 % nebulizer solution Take 2.5 mLs (0.5 mg dose) by nebulization 2 (two) times daily.     Multiple Vitamin (MULTIVITAMIN) tablet Take 1 tablet by mouth daily.     Omega-3 Fatty Acids (FISH OIL) 1200 MG CAPS Take 1,000 mg by mouth daily.      pantoprazole (PROTONIX) 40 MG tablet Take 1 tablet (40 mg total) by mouth daily. 30 tablet 0   pravastatin (PRAVACHOL) 40 MG tablet Take 80 mg by mouth daily.      TRADJENTA 5 MG TABS tablet TAKE 1 TABLET DAILY 90 tablet 0   UNABLE TO FIND Beet root     warfarin (COUMADIN) 7.5 MG tablet Take 1 tablet (7.5 mg total) by mouth daily.     No facility-administered medications prior to visit.     Allergies:   Penicillins, Other, Statins, and Spironolactone   Social History   Socioeconomic History   Marital status: Married    Spouse name: Not on file   Number of children: 5   Years of education: 12   Highest education level: Not on file  Occupational History   Occupation: Retired  Tobacco Use   Smoking status: Former    Types: Cigarettes    Quit date: 02/25/2000    Years since quitting: 21.0   Smokeless tobacco: Former  Scientific laboratory technician Use: Never used  Substance and Sexual Activity   Alcohol use: No   Drug use: No   Sexual activity: Not on file  Other Topics Concern   Not on file  Social History Narrative   Lives at home with his wife.   Right-handed.   3-4 cups caffeine per day.   Social Determinants of Health   Financial Resource Strain: Not on file  Food Insecurity: Not on file  Transportation Needs: Not on file  Physical Activity: Not on file  Stress: Not on file  Social Connections: Not on file     Family History:  The patient's ***family history includes  Coronary artery disease in his mother and another family member; Deep vein thrombosis in his son; Diabetes in his daughter and mother; Heart attack in his brother, mother, sister, and son; Heart disease in his brother, mother, and sister; Heart disease (age of onset: 50) in his son; Hyperlipidemia in his brother; Hypertension in his brother, daughter, mother, and sister; Other in his brother, daughter, and mother.   Review of Systems:    Please see the history of present illness.     All other systems reviewed and are otherwise negative  except as noted above.   Physical Exam:    VS:  There were no vitals taken for this visit.   General: Well developed, well nourished,male appearing in no acute distress. Head: Normocephalic, atraumatic. Neck: No carotid bruits. JVD not elevated.  Lungs: Respirations regular and unlabored, without wheezes or rales.  Heart: ***Regular rate and rhythm. No S3 or S4.  No murmur, no rubs, or gallops appreciated. Abdomen: Appears non-distended. No obvious abdominal masses. Msk:  Strength and tone appear normal for age. No obvious joint deformities or effusions. Extremities: No clubbing or cyanosis. No edema.  Distal pedal pulses are 2+ bilaterally. Neuro: Alert and oriented X 3. Moves all extremities spontaneously. No focal deficits noted. Psych:  Responds to questions appropriately with a normal affect. Skin: No rashes or lesions noted  Wt Readings from Last 3 Encounters:  10/30/20 148 lb 6.4 oz (67.3 kg)  08/10/20 149 lb (67.6 kg)  06/25/20 154 lb (69.9 kg)        Studies/Labs Reviewed:   EKG:  EKG is*** ordered today.  The ekg ordered today demonstrates ***  Recent Labs: 05/02/2020: ALT 28 10/19/2020: BUN 41; Creatinine 1.4; Hemoglobin 14.5; Platelets 342; Potassium 4.4; Sodium 137   Lipid Panel    Component Value Date/Time   CHOL 152 05/02/2020 0000   TRIG 209 (A) 05/02/2020 0000   HDL 28 (A) 05/02/2020 0000   CHOLHDL 5.6 07/25/2018 0443    VLDL 23 07/25/2018 0443   LDLCALC 88 05/02/2020 0000    Additional studies/ records that were reviewed today include:   R/LHC: 07/2018 Prox RCA lesion is 100% stenosed. Prox LAD lesion is 99% stenosed. Ost LM lesion is 75% stenosed. Prox Cx to Mid Cx lesion is 70% stenosed. LPDA lesion is 40% stenosed. Ost Cx to Prox Cx lesion is 30% stenosed.   Findings:   Ao = 148/81 (109)  LV = 13215 RA = 7 RV = 59/6 PA = 59/24 (37) PCW = 22 Fick cardiac output/index = 4.1/2.3 PVR = 3.3 WU Ao sat = 93% PA sat = 62%, 64%   Assessment: 1. Severe 3v CAD in left dominant system with 70-80% LM lesion and 99% proximal LAD lesion 2. Ischemic CM EF 25% 3. Mild to moderate pulmonary HTN 4. Moderately reduced CO   Plan/Discussion:   Will d/w with TCTS regarding possible CABG and [place]. If not surgical candidate will need PCI of LAD with atherectomy +/- LM PCI.    Echocardiogram: 11/2018 IMPRESSIONS     1. Left ventricular ejection fraction, by visual estimation, is 35 to  40%. The left ventricle has normal function. Mildly increased left  ventricular size. There is mildly increased left ventricular hypertrophy.   2. Septal apical and inferior wall hypokinesis.   3. Global right ventricle has normal systolic function.The right  ventricular size is normal. No increase in right ventricular wall  thickness.   4. Left atrial size was moderately dilated.   5. Right atrial size was moderately dilated.   6. Moderate calcification of the mitral valve leaflet(s).   7. Moderate mitral annular calcification.   8. Moderate thickening of the mitral valve leaflet(s).   9. The mitral valve is normal in structure. Mild mitral valve  regurgitation. No evidence of mitral stenosis.  10. The tricuspid valve is normal in structure. Tricuspid valve  regurgitation is mild.  11. The aortic valve is tricuspid Aortic valve regurgitation is mild by  color flow Doppler. Mild aortic valve sclerosis without  stenosis.  12.  The pulmonic valve was normal in structure. Pulmonic valve  regurgitation is mild by color flow Doppler.  13. Moderately elevated pulmonary artery systolic pressure.  14. The inferior vena cava is normal in size with greater than 50%  respiratory variability, suggesting right atrial pressure of 3 mmHg.    Assessment:    No diagnosis found.   Plan:   In order of problems listed above:  ***    Shared Decision Making/Informed Consent:   {Are you ordering a CV Procedure (e.g. stress test, cath, DCCV, TEE, etc)?   Press F2        :[id number]}    Medication Adjustments/Labs and Tests Ordered: Current medicines are reviewed at length with the patient today.  Concerns regarding medicines are outlined above.  Medication changes, Labs and Tests ordered today are listed in the Patient Instructions below. There are no Patient Instructions on file for this visit.   Signed, Erma Heritage, PA-C  03/12/2021 7:32 AM    [facility] [facility] [address] [place], [place] [address] Phone: [phone] Fax: [phone]

## 2021-03-18 ENCOUNTER — Other Ambulatory Visit: Payer: Self-pay | Admitting: Cardiology

## 2021-03-29 ENCOUNTER — Other Ambulatory Visit: Payer: Self-pay | Admitting: Cardiology

## 2021-04-08 IMAGING — CR DG HIP (WITH OR WITHOUT PELVIS) 3-4V BILAT
6 series · 6 of 6 positions shown · non-contrast
Comparison: None.

CLINICAL DATA: Fall, soreness, initial encounter.

EXAM:
DG HIP (WITH OR WITHOUT PELVIS) 3-4V BILAT

[pelvis ap]
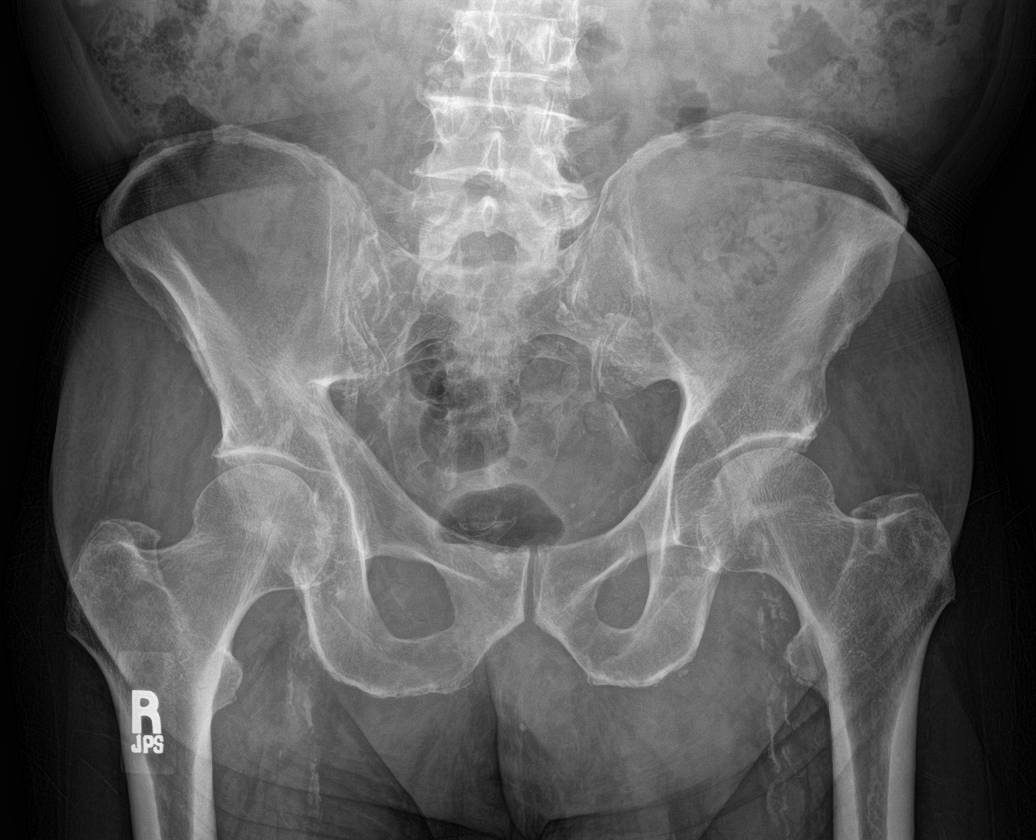

[hip ap (1 of 3)]
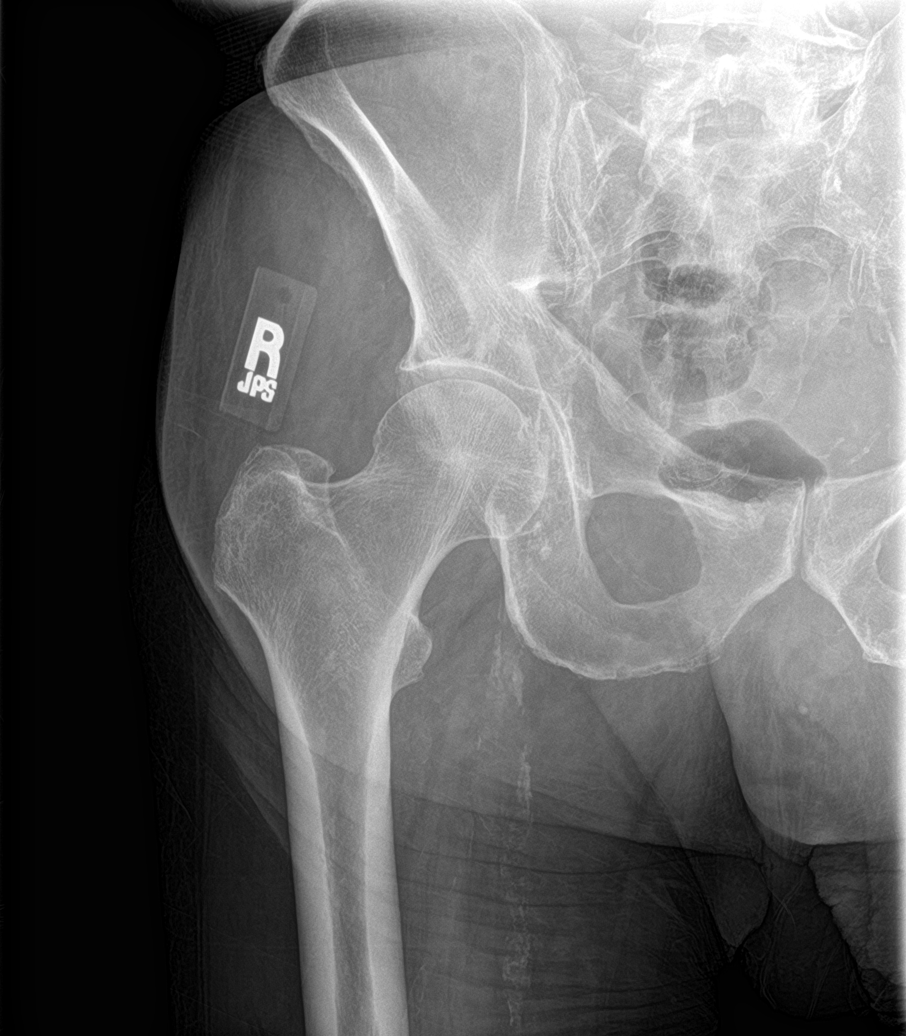

[hip lat (1 of 2)]
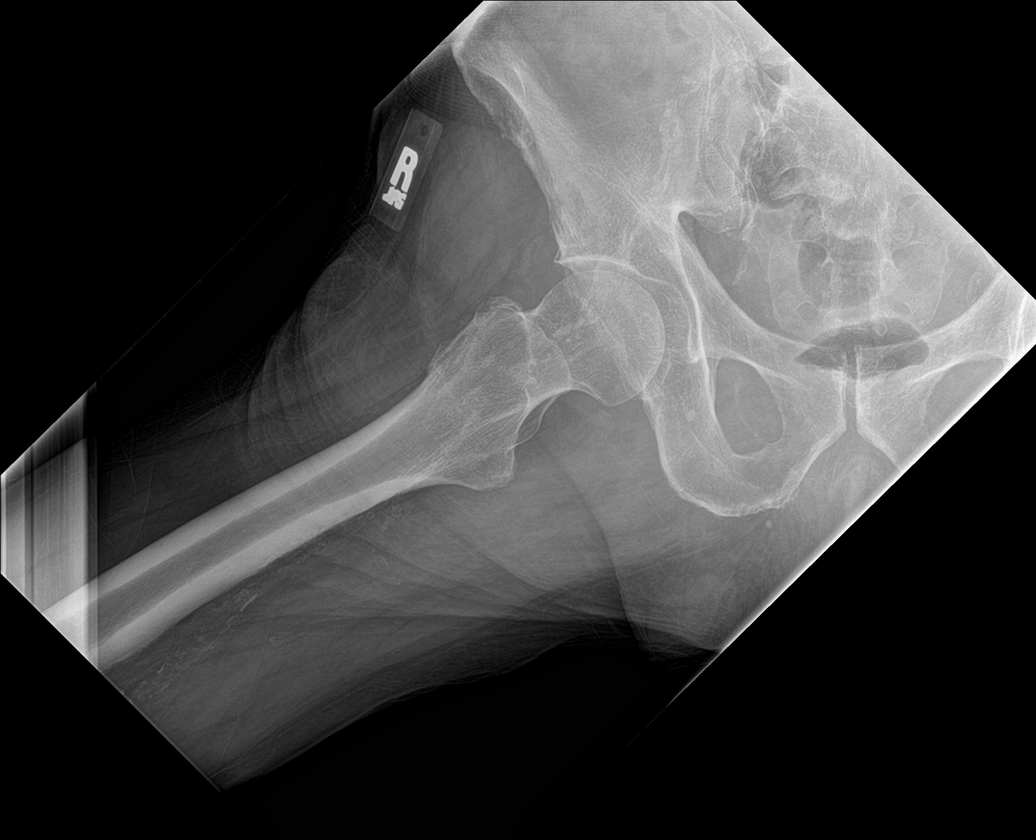

[hip ap (2 of 3)]
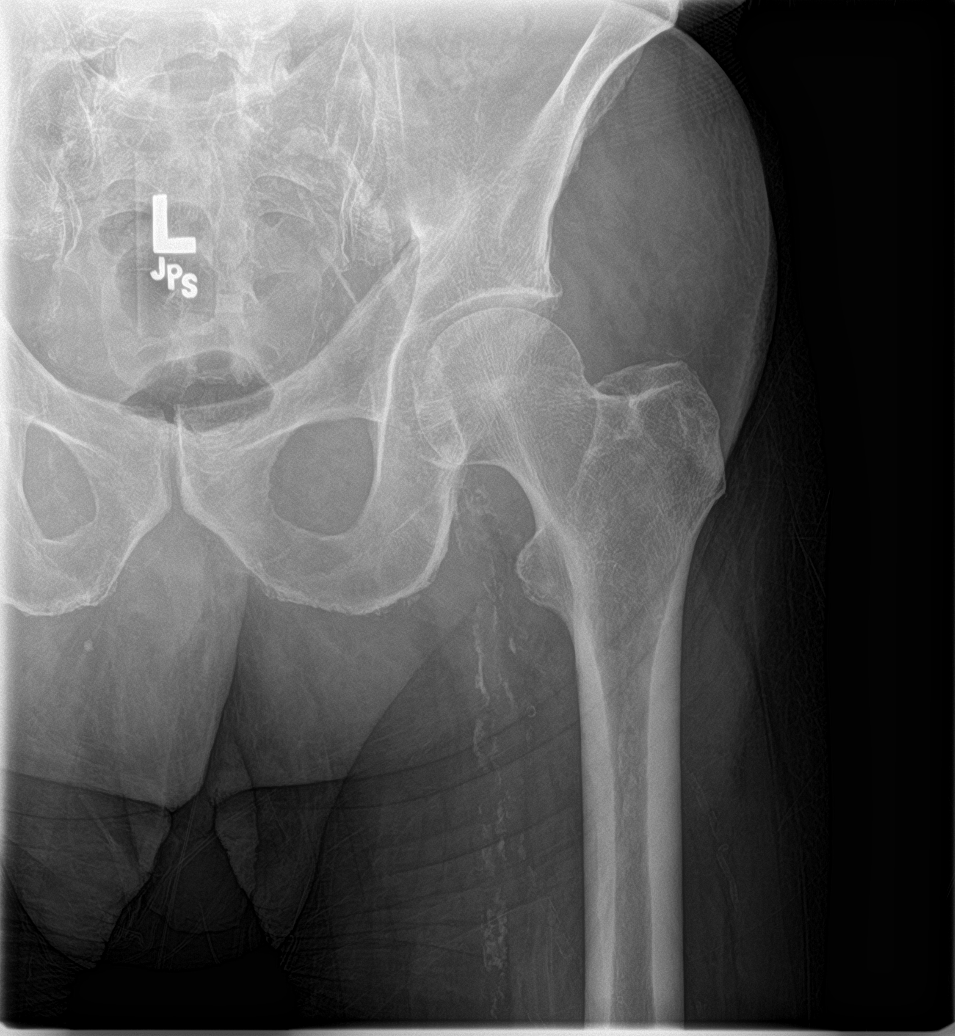

[hip lat (2 of 2)]
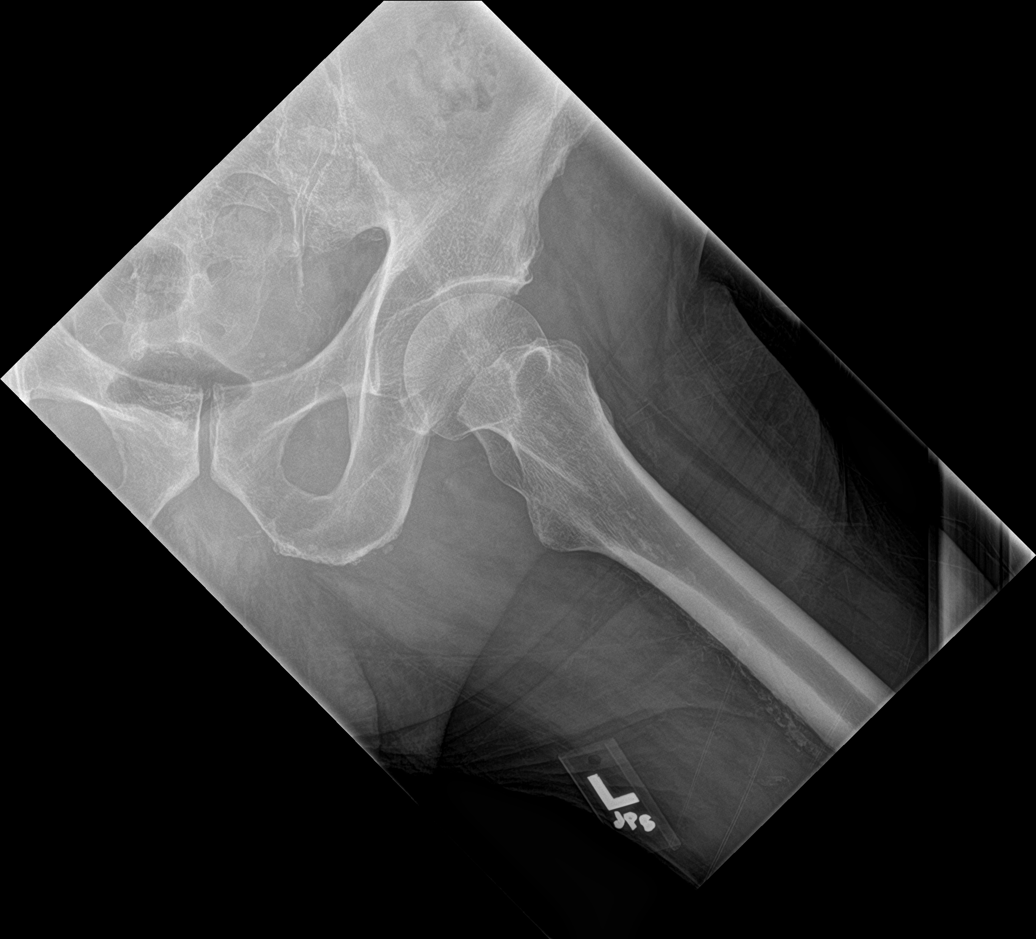

[hip ap (3 of 3)]
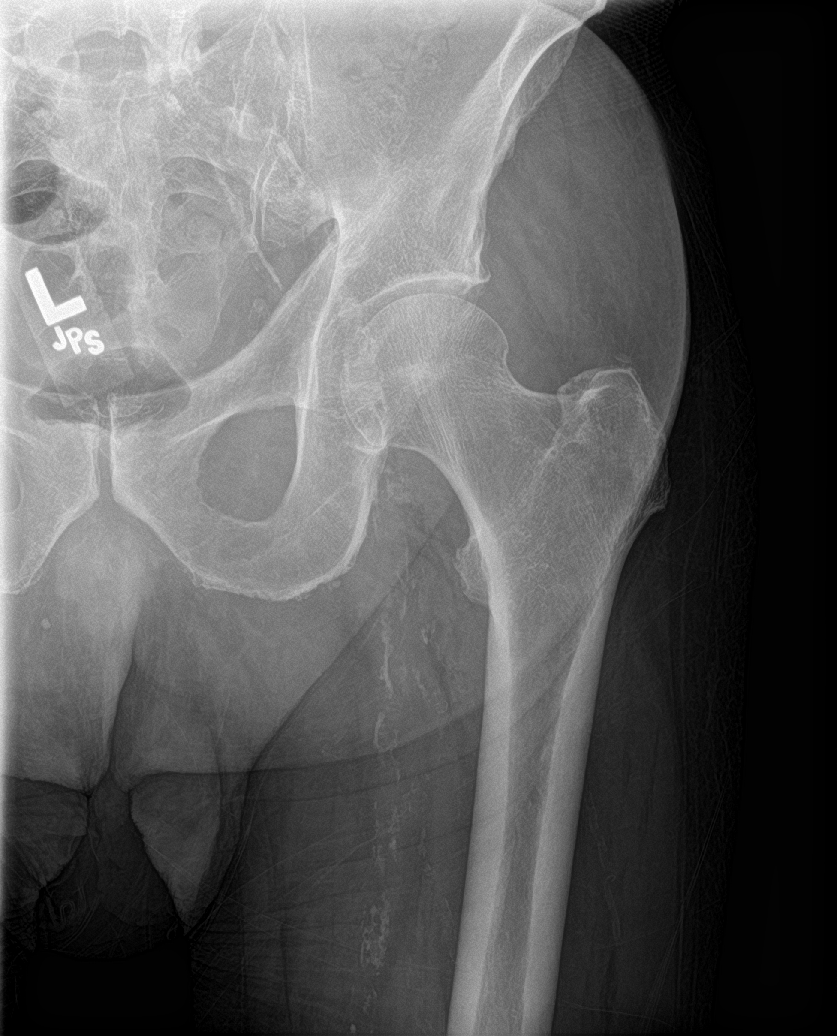

[6 of 6 positions shown; findings below may reference images not displayed]

FINDINGS: No acute osseous or joint abnormality.
IMPRESSION: No acute osseous or joint abnormality.

## 2021-04-30 ENCOUNTER — Other Ambulatory Visit: Payer: Self-pay

## 2021-04-30 ENCOUNTER — Ambulatory Visit (INDEPENDENT_AMBULATORY_CARE_PROVIDER_SITE_OTHER): Payer: Medicare Other | Admitting: Nurse Practitioner

## 2021-04-30 ENCOUNTER — Encounter: Payer: Self-pay | Admitting: Nurse Practitioner

## 2021-04-30 VITALS — BP 137/63 | HR 74 | Ht 66.0 in | Wt 151.6 lb

## 2021-04-30 DIAGNOSIS — E1122 Type 2 diabetes mellitus with diabetic chronic kidney disease: Secondary | ICD-10-CM | POA: Diagnosis not present

## 2021-04-30 DIAGNOSIS — E785 Hyperlipidemia, unspecified: Secondary | ICD-10-CM | POA: Diagnosis not present

## 2021-04-30 DIAGNOSIS — N1831 Chronic kidney disease, stage 3a: Secondary | ICD-10-CM | POA: Diagnosis not present

## 2021-04-30 DIAGNOSIS — I10 Essential (primary) hypertension: Secondary | ICD-10-CM

## 2021-04-30 LAB — POCT GLYCOSYLATED HEMOGLOBIN (HGB A1C): HbA1c, POC (controlled diabetic range): 8.2 % — AB (ref 0.0–7.0)

## 2021-04-30 NOTE — Progress Notes (Signed)
Endocrinology Follow Up Note       04/30/2021, 3:52 PM   Subjective:    Patient ID: Oscar [name], male    DOB: Jan 04, 1941.  Oscar [name] is being seen in follow up after being seen in consultation for management of currently uncontrolled symptomatic diabetes requested by  Adaline Sill, NP.   Past Medical History:  Diagnosis Date   Anemia    Atrial fibrillation (HCC)    permanent   CAD (coronary artery disease)    a. cath on 07/29/2018 showed 100% Proximal-RCA stenosis, 99% Proximal-LAD, 75% Ost LM, 70% LCx, 40% PDA, and 30% Ost LCx. Declined CABG   Cardiomyopathy    EF 35% in the past--Improved to 55% echo 2/10. Moderately severe mitral regurgitation in the past, now improved,   Carotid artery stenosis    CHF (congestive heart failure) ([place]) 2002   COPD (chronic obstructive pulmonary disease) ([place])    Coronary artery disease    non obstructive   Diabetes mellitus    Dyslipidemia    History of tobacco abuse    Nephrolithiasis    Tremors of nervous system     Past Surgical History:  Procedure Laterality Date   CAROTID ENDARTERECTOMY Left 10-21-05   cea   CATARACT EXTRACTION W/PHACO Left 09/10/2018   Procedure: CATARACT EXTRACTION PHACO AND INTRAOCULAR LENS PLACEMENT LEFT EYE;  Surgeon: Baruch Goldmann, MD;  Location: [facility];  Service: Ophthalmology;  Laterality: Left;  left   CATARACT EXTRACTION W/PHACO Right 11/12/2018   Procedure: CATARACT EXTRACTION PHACO AND INTRAOCULAR LENS PLACEMENT RIGHT EYE;  Surgeon: Baruch Goldmann, MD;  Location: [facility];  Service: Ophthalmology;  Laterality: Right;  right   ENDARTERECTOMY Right 08/11/2018   Procedure: ENDARTERECTOMY CAROTID RIGHT;  Surgeon: [name], MD;  Location: [facility] OR;  Service: Vascular;  Laterality: Right;   left carotid endarterectomy     PATCH ANGIOPLASTY Right 08/11/2018   Procedure: PATCH ANGIOPLAST USING HEMASHIELD PLATINUM FINESSE;  Surgeon:  [name], MD;  Location: [facility];  Service: Vascular;  Laterality: Right;   RIGHT/LEFT HEART CATH AND CORONARY ANGIOGRAPHY N/A 07/29/2018   Procedure: RIGHT/LEFT HEART CATH AND CORONARY ANGIOGRAPHY;  Surgeon: Jolaine Artist, MD;  Location: [facility];  Service: Cardiovascular;  Laterality: N/A;   TONSILLECTOMY      Social History   Socioeconomic History   Marital status: Married    Spouse name: Not on file   Number of children: 5   Years of education: 12   Highest education level: Not on file  Occupational History   Occupation: Retired  Tobacco Use   Smoking status: Former    Types: Cigarettes    Quit date: 02/25/2000    Years since quitting: 21.1   Smokeless tobacco: Former  Scientific laboratory technician Use: Never used  Substance and Sexual Activity   Alcohol use: No   Drug use: No   Sexual activity: Not on file  Other Topics Concern   Not on file  Social History Narrative   Lives at home with his wife.   Right-handed.   3-4 cups caffeine per day.   Social Determinants of Radio broadcast assistant  Strain: Not on file  Food Insecurity: Not on file  Transportation Needs: Not on file  Physical Activity: Not on file  Stress: Not on file  Social Connections: Not on file    Family History  Problem Relation Age of Onset   Coronary artery disease Mother    Diabetes Mother    Heart disease Mother        Before age 26 and  CHF   Hypertension Mother    Other Mother        varicose veins   Heart attack Mother    Heart disease Sister    Hypertension Sister    Heart attack Sister    Heart disease Brother    Hyperlipidemia Brother    Hypertension Brother    Other Brother        varicose veins   Heart attack Brother    Heart disease Son 65       Heart Disease before age 77- Open hear surgery   Deep vein thrombosis Son    Heart attack Son    Coronary artery disease Other    Diabetes Daughter    Hypertension Daughter    Other Daughter        varicose veins     Outpatient Encounter Medications as of 04/30/2021  Medication Sig   acetaminophen (TYLENOL) 325 MG tablet Take 2 tablets (650 mg total) by mouth every 6 (six) hours as needed for mild pain (or Fever >/= 101).   albuterol (PROAIR HFA) 108 (90 Base) MCG/ACT inhaler Inhale 2 puffs into the lungs every 6 (six) hours as needed for wheezing or shortness of breath.   aspirin EC 81 MG EC tablet Take 1 tablet (81 mg total) by mouth daily.   carvedilol (COREG) 12.5 MG tablet TAKE 1 TABLET TWICE A DAY   Cholecalciferol (VITAMIN D3) 10 MCG (400 UNIT) CAPS Take 400 Units by mouth daily.   digoxin (LANOXIN) 0.125 MG tablet Take 1 tablet (125 mcg total) by mouth daily.   ENTRESTO 24-26 MG TAKE 1 TABLET TWICE A DAY   ezetimibe (ZETIA) 10 MG tablet Take 10 mg by mouth daily.   fenofibrate 160 MG tablet Take 160 mg by mouth daily.   furosemide (LASIX) 40 MG tablet TAKE 1 TABLET DAILY (MAINTENANCE DOSE IS NOW 40 MG DAILY)   GARLIC PO Take by mouth.   glipiZIDE (GLIPIZIDE XL) 10 MG 24 hr tablet Take 1 tablet (10 mg total) by mouth daily with breakfast.   ipratropium (ATROVENT) 0.02 % nebulizer solution Take 2.5 mLs (0.5 mg dose) by nebulization 2 (two) times daily.   Multiple Vitamin (MULTIVITAMIN) tablet Take 1 tablet by mouth daily.   Omega-3 Fatty Acids (FISH OIL) 1200 MG CAPS Take 1,000 mg by mouth daily.    pantoprazole (PROTONIX) 40 MG tablet Take 1 tablet (40 mg total) by mouth daily.   pravastatin (PRAVACHOL) 40 MG tablet Take 80 mg by mouth daily.    TRADJENTA 5 MG TABS tablet TAKE 1 TABLET DAILY   UNABLE TO FIND Beet root   warfarin (COUMADIN) 7.5 MG tablet Take 1 tablet (7.5 mg total) by mouth daily.   No facility-administered encounter medications on file as of 04/30/2021.    ALLERGIES: Allergies  Allergen Reactions   Penicillins Hives, Other (See Comments) and Rash    Blisters on hands Blisters on hands  Blisters on hands Blisters on hands Blisters on hands Blisters on hands   Other  Other (See Comments)    Joint  and muscle pain and low energy. Joint and muscle pain and low energy.   Statins Other (See Comments)    Joint and muscle pain and low energy.   Penicillin G Sodium     Other reaction(s): hives   Spironolactone     Hyperkalemia Hyperkalemia Hyperkalemia   Statins Support [Acid Blockers Support]     Other reaction(s): weakness    VACCINATION STATUS:  There is no immunization history on file for this patient.  Diabetes He presents for his follow-up diabetic visit. He has type 2 diabetes mellitus. Onset time: Diagnosed at approx age of 43. His disease course has been worsening. There are no hypoglycemic associated symptoms. Pertinent negatives for diabetes include no blurred vision, no fatigue and no foot paresthesias. There are no hypoglycemic complications. Symptoms are stable. Diabetic complications include a CVA, heart disease and nephropathy. Risk factors for coronary artery disease include diabetes mellitus, dyslipidemia, hypertension, male sex and sedentary lifestyle. Current diabetic treatment includes oral agent (dual therapy). He is compliant with treatment most of the time. His weight is increasing steadily. He is following a generally healthy diet. When asked about meal planning, he reported none. He has not had a previous visit with a dietitian. He rarely participates in exercise. His home blood glucose trend is increasing steadily. His breakfast blood glucose range is generally 140-180 mg/dl. His bedtime blood glucose range is generally 140-180 mg/dl. (He presents today with his meter and logs showing above target glycemic profile overall.  His POCT A1c today is 8.2%, worsening from previous visit of 6.9%.  He does admit to indulging in foods over the holidays but is now getting back on track.  He denies any hypoglycemia.  Analysis of his meter shows 14-day average of 202 and 30-day average of 187.) An ACE inhibitor/angiotensin II receptor blocker is  contraindicated. He does not see a podiatrist.Eye exam is current.  Hypertension This is a chronic problem. The current episode started more than 1 year ago. The problem has been resolved since onset. The problem is controlled. Pertinent negatives include no blurred vision. There are no associated agents to hypertension. Risk factors for coronary artery disease include diabetes mellitus, dyslipidemia, male gender and sedentary lifestyle. Past treatments include beta blockers and diuretics. The current treatment provides moderate improvement. There are no compliance problems.  Hypertensive end-organ damage includes kidney disease, CAD/MI, CVA and heart failure. Identifiable causes of hypertension include chronic renal disease and renovascular disease.  Hyperlipidemia This is a chronic problem. The current episode started more than 1 year ago. The problem is controlled. Recent lipid tests were reviewed and are normal. Exacerbating diseases include chronic renal disease, diabetes and nephrotic syndrome. Factors aggravating his hyperlipidemia include beta blockers and fatty foods. Current antihyperlipidemic treatment includes statins. There are no compliance problems.  Risk factors for coronary artery disease include diabetes mellitus, dyslipidemia, hypertension, male sex and a sedentary lifestyle.    Review of systems  Constitutional: + Minimally fluctuating body weight,  current Body mass index is 24.47 kg/m. , no fatigue, no subjective hyperthermia, no subjective hypothermia Eyes: no blurry vision, no xerophthalmia ENT: no sore throat, no nodules palpated in throat, no dysphagia/odynophagia, no hoarseness Cardiovascular: no chest pain, no shortness of breath, no palpitations, no leg swelling Respiratory: no cough, no shortness of breath Gastrointestinal: no nausea/vomiting/diarrhea Musculoskeletal: no muscle/joint aches Skin: no rashes, no hyperemia Neurological: no tremors, no numbness, no  tingling, no dizziness Psychiatric: no depression, no anxiety  Objective:     BP 137/63  Pulse 74    Ht '5\' 6"'$  (1.676 m)    Wt 151 lb 9.6 oz (68.8 kg)    SpO2 96%    BMI 24.47 kg/m   Wt Readings from Last 3 Encounters:  04/30/21 151 lb 9.6 oz (68.8 kg)  10/30/20 148 lb 6.4 oz (67.3 kg)  08/10/20 149 lb (67.6 kg)    BP Readings from Last 3 Encounters:  04/30/21 137/63  10/30/20 117/73  08/10/20 118/72     Physical Exam- Limited  Constitutional:  Body mass index is 24.47 kg/m. , not in acute distress, normal state of mind Eyes:  EOMI, no exophthalmos Neck: Supple Cardiovascular: RRR, no murmurs, rubs, or gallops, no edema Respiratory: Adequate breathing efforts, no crackles, rales, rhonchi, or wheezing Musculoskeletal: no gross deformities, strength intact in all four extremities, no gross restriction of joint movements Skin:  no rashes, no hyperemia Neurological: no tremor with outstretched hands    CMP ( most recent) CMP     Component Value Date/Time   NA 137 10/19/2020 0000   K 4.4 10/19/2020 0000   CL 100 10/19/2020 0000   CO2 29 (A) 10/19/2020 0000   GLUCOSE 274 (H) 08/04/2019 1449   BUN 41 (A) 10/19/2020 0000   CREATININE 1.4 (A) 10/19/2020 0000   CREATININE 1.36 (H) 08/04/2019 1449   CALCIUM 9.5 10/19/2020 0000   PROT 6.9 08/04/2019 1449   ALBUMIN 4.3 10/19/2020 0000   AST 30 05/02/2020 0000   ALT 28 05/02/2020 0000   ALKPHOS 51 05/02/2020 0000   BILITOT 0.5 08/04/2019 1449   GFRNONAA 45 02/02/2020 0000   GFRAA 57 (L) 08/04/2019 1449     Diabetic Labs (most recent): Lab Results  Component Value Date   HGBA1C 8.2 (A) 04/30/2021   HGBA1C 6.9 (A) 10/30/2020   HGBA1C 8.3 05/02/2020     Lipid Panel ( most recent) Lipid Panel     Component Value Date/Time   CHOL 152 05/02/2020 0000   TRIG 209 (A) 05/02/2020 0000   HDL 28 (A) 05/02/2020 0000   CHOLHDL 5.6 07/25/2018 0443   VLDL 23 07/25/2018 0443   LDLCALC 88 05/02/2020 0000      Lab  Results  Component Value Date   TSH 0.997 06/16/2014           Assessment & Plan:   1) Type 2 Diabetes with CKD stage 3a  - Oscar [name] has currently uncontrolled symptomatic type 2 DM since 81 years of age.  He presents today with his meter and logs showing at goal fasting and postprandial glycemic profile.  His POCT A1c today is 6.9%, improving from last visit of 8.7%.  He denies any significant hypoglycemia.  -Recent labs reviewed.  - I had a long discussion with him about the progressive nature of diabetes and the pathology behind its complications. -his diabetes is complicated by TIA, MI, CHF, CAD, CKD and he remains at a high risk for more acute and chronic complications which include retinopathy, and neuropathy. These are all discussed in detail with him.  - Nutritional counseling repeated at each appointment due to patients tendency to fall back in to old habits.  - The patient admits there is a room for improvement in their diet and drink choices. -  Suggestion is made for the patient to avoid simple carbohydrates from their diet including Cakes, Sweet Desserts / Pastries, Ice Cream, Soda (diet and regular), Sweet Tea, Candies, Chips, Cookies, Sweet Pastries, Store Bought Juices, Alcohol in Excess of 1-2  drinks a day, Artificial Sweeteners, Coffee Creamer, and "Sugar-free" Products. This will help patient to have stable blood glucose profile and potentially avoid unintended weight gain.   - I encouraged the patient to switch to unprocessed or minimally processed complex starch and increased protein intake (animal or plant source), fruits, and vegetables.   - Patient is advised to stick to a routine mealtimes to eat 3 meals a day and avoid unnecessary snacks (to snack only to correct hypoglycemia).  - I have approached him with the following individualized plan to manage  his diabetes and patient agrees:   Avoiding hypoglycemia is the #1 priority in his care given his age  and comorbidities.  An appropriate A1c goal for him would be 7.0-7.5%.  -he is encouraged to continue monitoring blood glucose twice daily, before breakfast and before bed, and to call the clinic if he has readings less than 70 or greater than 300 for 3 tests in a row.  - Based on his improving glycemic control over the past several weeks, he is advised to continue Tradjenta 5 mg po daily and Glipizide 10 mg XL daily with breakfast.    - he is not a candidate for Metformin, SGLT2 inhibitors due to concurrent renal insufficiency.  -If he cannot gain control of diabetes with Tradjenta and Glipizide along with diet and exercise, may need to consider adding basal insulin, which is something he would like to avoid if possible.  - Specific targets for  A1c;  LDL, HDL,  and Triglycerides were discussed with the patient.  2) Blood Pressure /Hypertension:  his blood pressure is controlled to target.   he is advised to continue his current medications including Coreg 12.5 mg po twice daily, Lasix 40 mg po daily, and Entresto 24-26 mg po daily.  3) Lipids/Hyperlipidemia:    His most recent lipid panel from 05/02/20 shows controlled LDL of 88 and elevated triglycerides of 209.  He is advised to continue Pravastatin 40 mg po daily at bedtime, Fish Oil, Fenofibrate, and Zetia.  Side effects and precautions discussed with him.   4)  Weight/Diet:  his Body mass index is 24.47 kg/m.  - He is not a candidate for weight loss. Exercise, and detailed carbohydrates information provided  -  detailed on discharge instructions.  5) Chronic Care/Health Maintenance: -he is on Statin medications and is encouraged to initiate and continue to follow up with Ophthalmology, Dentist, Podiatrist at least yearly or according to recommendations, and advised to stay away from smoking. I have recommended yearly flu vaccine and pneumonia vaccine at least every 5 years; moderate intensity exercise for up to 150 minutes weekly; and  sleep for at least 7 hours a day.   - he is advised to maintain close follow up with Adaline Sill, NP for primary care needs, as well as his other providers for optimal and coordinated care.     I spent 41 minutes in the care of the patient today including review of labs from [facility], Lipids, Thyroid Function, Hematology (current and previous including abstractions from other facilities); face-to-face time discussing  his blood glucose readings/logs, discussing hypoglycemia and hyperglycemia episodes and symptoms, medications doses, his options of short and long term treatment based on the latest standards of care / guidelines;  discussion about incorporating lifestyle medicine;  and documenting the encounter.    Please refer to Patient Instructions for Blood Glucose Monitoring and Insulin/Medications Dosing Guide"  in media tab for additional information. Please  also refer to "  Patient Self Inventory" in the Media  tab for reviewed elements of pertinent patient history.  Oscar [name] participated in the discussions, expressed understanding, and voiced agreement with the above plans.  All questions were answered to his satisfaction. he is encouraged to contact clinic should he have any questions or concerns prior to his return visit.   Follow up plan: - Return in about 4 months (around 08/30/2021) for Diabetes F/U with A1c in office, No previsit labs, Bring meter and logs.  Rayetta Pigg, [facility] [address] [place], [place] [address] Phone: [phone] Fax: [phone]  04/30/2021, 3:52 PM

## 2021-04-30 NOTE — Patient Instructions (Signed)
Diabetes Mellitus Emergency Preparedness Plan ?A diabetes emergency preparedness plan is a checklist to make sure you have everything you need to manage your diabetes in case of an emergency, such as an evacuation, natural disaster, national security emergency, or pandemic lockdown. ?Managing your diabetes is something you have to do all day every day. The American Diabetes Association and the American College of Endocrinology both recommend putting together an emergency diabetes kit. Your kit should include important information and documents as well as all the supplies you will need to manage your diabetes for at least 1 week. Store it in a portable, waterproof bag or container. The best time to start making your emergency kit is now. ?How to make your emergency kit ?Collect information and documents ?Include the following information and documents in your kit: ?The type of diabetes you have. ?A copy of your health insurance cards and photo ID. ?A list of all your other medical conditions, allergies, and surgeries. ?A list of all your medicines and doses with the contact information for your pharmacy. Ask your health care provider for a list of your current medicines. ?Any recent lab results, including your latest hemoglobin A1C (HbA1C). ?The make, model, and serial number of your insulin pump, if you use one. Also include contact information for the manufacturer. ?Contact information for people who should be notified in case of an emergency. Include your health care provider's name, address, and phone number. ?Collect diabetes care items ?Include the following diabetes care items in your kit: ?At least a 1-week supply of: ?Oral medicines. ?Insulin. ?Blood glucose testing supplies. These include testing strips, lancets, and extra batteries for your blood glucose monitor and pump. ?A charger for the continuous glucose monitor (CGM) receiver and pump. ?Any extra supplies needed for your CGM or pump. ?A supply of  glucagon, glucose tablets, juice, soda, or hard candy in case of hypoglycemia. ?Coolers or cold packs. ?A safe container for syringes, needles, and lancets. ? ?Other preparations ?Other things to consider doing as part of your emergency plan: ?Make sure that your mobile phone is charged and that you have an extra charger, cable, or batteries. ?Choose a meeting place for family members. ?Wear a medical alert or ID bracelet. ?If you have a child with diabetes, make sure your child's school has a copy of his or her emergency plan, including the name of the staff member who will assist your child. ?Where to find more information ?American Diabetes Association: www.diabetes.org ?Centers for Disease Control and Prevention: blogs.cdc.gov ?Summary ?A diabetes emergency preparedness plan is a checklist to make sure you have everything you need in case of an emergency. ?Your kit should include important information and documents as well as all the supplies you will need to manage your condition for at least 1 week. ?Store your kit in a portable, waterproof bag or container. ?The best time to start making your emergency kit is now. ?This information is not intended to replace advice given to you by your health care provider. Make sure you discuss any questions you have with your health care provider. ?Document Revised: 08/18/2019 Document Reviewed: 08/18/2019 ?Elsevier Patient Education ? 2022 Elsevier Inc. ? ?

## 2021-05-30 ENCOUNTER — Other Ambulatory Visit: Payer: Self-pay | Admitting: Nurse Practitioner

## 2021-05-30 ENCOUNTER — Other Ambulatory Visit: Payer: Self-pay | Admitting: Cardiology

## 2021-06-11 LAB — BASIC METABOLIC PANEL
BUN: 33 — AB (ref 4–21)
CO2: 21 (ref 13–22)
Chloride: 99 (ref 99–108)
Creatinine: 1.3 (ref <=1.3)
Glucose: 231
Potassium: 4.6 mEq/L (ref 3.5–5.1)
Sodium: 135 — AB (ref 137–147)

## 2021-06-11 LAB — LIPID PANEL
Cholesterol: 121 (ref 0–200)
HDL: 25 — AB (ref 35–70)
LDL Cholesterol: 48
Triglycerides: 316 — AB (ref 40–160)

## 2021-06-11 LAB — HEPATIC FUNCTION PANEL
ALT: 20 U/L (ref 10–40)
AST: 26 (ref 14–40)
Alkaline Phosphatase: 39 (ref 25–125)
Bilirubin, Total: 0.4

## 2021-06-11 LAB — CBC AND DIFFERENTIAL
HCT: 40 — AB (ref 41–53)
Hemoglobin: 13.2 — AB (ref 13.5–17.5)
Neutrophils Absolute: 5.3
Platelets: 345 10*3/uL (ref 150–400)
WBC: 11.3

## 2021-06-11 LAB — COMPREHENSIVE METABOLIC PANEL
Albumin: 4.2 (ref 3.5–5.0)
Calcium: 9.8 (ref 8.7–10.7)
Globulin: 1.9
eGFR: 54

## 2021-06-11 LAB — CBC: RBC: 4.51 (ref 3.87–5.11)

## 2021-06-27 ENCOUNTER — Other Ambulatory Visit: Payer: Self-pay | Admitting: Cardiology

## 2021-07-17 ENCOUNTER — Other Ambulatory Visit: Payer: Self-pay | Admitting: Cardiology

## 2021-07-17 ENCOUNTER — Telehealth: Payer: Self-pay | Admitting: Cardiology

## 2021-07-17 MED ORDER — DIGOXIN 125 MCG PO TABS
125.0000 ug | ORAL_TABLET | Freq: Every day | ORAL | 1 refills | Status: DC
Start: 2021-07-17 — End: 2021-09-23

## 2021-07-17 NOTE — Telephone Encounter (Signed)
*  STAT* If patient is at the pharmacy, call can be transferred to refill team.   1. Which medications need to be refilled? (please list name of each medication and dose if known) digoxin (LANOXIN) 0.125 MG tablet    2. Which pharmacy/location (including street and city if local pharmacy) is medication to be sent to? [facility] [place], [place]  3. Do they need a 30 day or 90 day supply? [place]

## 2021-08-13 ENCOUNTER — Other Ambulatory Visit: Payer: Self-pay | Admitting: Cardiology

## 2021-08-28 ENCOUNTER — Other Ambulatory Visit: Payer: Self-pay | Admitting: Cardiology

## 2021-09-02 ENCOUNTER — Ambulatory Visit (INDEPENDENT_AMBULATORY_CARE_PROVIDER_SITE_OTHER): Payer: Medicare Other | Admitting: Nurse Practitioner

## 2021-09-02 ENCOUNTER — Encounter: Payer: Self-pay | Admitting: Nurse Practitioner

## 2021-09-02 VITALS — BP 142/80 | HR 60 | Ht 66.0 in | Wt 152.0 lb

## 2021-09-02 DIAGNOSIS — I10 Essential (primary) hypertension: Secondary | ICD-10-CM

## 2021-09-02 DIAGNOSIS — E1122 Type 2 diabetes mellitus with diabetic chronic kidney disease: Secondary | ICD-10-CM

## 2021-09-02 DIAGNOSIS — N1831 Chronic kidney disease, stage 3a: Secondary | ICD-10-CM | POA: Diagnosis not present

## 2021-09-02 DIAGNOSIS — E785 Hyperlipidemia, unspecified: Secondary | ICD-10-CM | POA: Diagnosis not present

## 2021-09-02 LAB — POCT GLYCOSYLATED HEMOGLOBIN (HGB A1C): HbA1c POC (<> result, manual entry): 7.3 % (ref 4.0–5.6)

## 2021-09-02 MED ORDER — GLIPIZIDE ER 10 MG PO TB24: 10.0000 mg | ORAL_TABLET | Freq: Every day | ORAL | 3 refills | Status: DC

## 2021-09-02 MED ORDER — LINAGLIPTIN 5 MG PO TABS: 5.0000 mg | ORAL_TABLET | Freq: Every day | ORAL | 3 refills | Status: DC

## 2021-09-02 NOTE — Progress Notes (Signed)
Endocrinology Follow Up Note       09/02/2021, 2:11 PM   Subjective:    Patient ID: Oscar [name], male    DOB: 10-03-1940.  Oscar [name] is being seen in follow up after being seen in consultation for management of currently uncontrolled symptomatic diabetes requested by  Adaline Sill, NP.   Past Medical History:  Diagnosis Date   Anemia    Atrial fibrillation (HCC)    permanent   CAD (coronary artery disease)    a. cath on 07/29/2018 showed 100% Proximal-RCA stenosis, 99% Proximal-LAD, 75% Ost LM, 70% LCx, 40% PDA, and 30% Ost LCx. Declined CABG   Cardiomyopathy    EF 35% in the past--Improved to 55% echo 2/10. Moderately severe mitral regurgitation in the past, now improved,   Carotid artery stenosis    CHF (congestive heart failure) ([place]) 2002   COPD (chronic obstructive pulmonary disease) ([place])    Coronary artery disease    non obstructive   Diabetes mellitus    Dyslipidemia    History of tobacco abuse    Nephrolithiasis    Tremors of nervous system     Past Surgical History:  Procedure Laterality Date   CAROTID ENDARTERECTOMY Left 10-21-05   cea   CATARACT EXTRACTION W/PHACO Left 09/10/2018   Procedure: CATARACT EXTRACTION PHACO AND INTRAOCULAR LENS PLACEMENT LEFT EYE;  Surgeon: Baruch Goldmann, MD;  Location: [facility];  Service: Ophthalmology;  Laterality: Left;  left   CATARACT EXTRACTION W/PHACO Right 11/12/2018   Procedure: CATARACT EXTRACTION PHACO AND INTRAOCULAR LENS PLACEMENT RIGHT EYE;  Surgeon: Baruch Goldmann, MD;  Location: [facility];  Service: Ophthalmology;  Laterality: Right;  right   ENDARTERECTOMY Right 08/11/2018   Procedure: ENDARTERECTOMY CAROTID RIGHT;  Surgeon: [name], MD;  Location: [facility] OR;  Service: Vascular;  Laterality: Right;   left carotid endarterectomy     PATCH ANGIOPLASTY Right 08/11/2018   Procedure: PATCH ANGIOPLAST USING HEMASHIELD PLATINUM FINESSE;  Surgeon:  [name], MD;  Location: [facility];  Service: Vascular;  Laterality: Right;   RIGHT/LEFT HEART CATH AND CORONARY ANGIOGRAPHY N/A 07/29/2018   Procedure: RIGHT/LEFT HEART CATH AND CORONARY ANGIOGRAPHY;  Surgeon: Jolaine Artist, MD;  Location: [facility];  Service: Cardiovascular;  Laterality: N/A;   TONSILLECTOMY      Social History   Socioeconomic History   Marital status: Married    Spouse name: Not on file   Number of children: 5   Years of education: 12   Highest education level: Not on file  Occupational History   Occupation: Retired  Tobacco Use   Smoking status: Former    Types: Cigarettes    Quit date: 02/25/2000    Years since quitting: 21.5   Smokeless tobacco: Former  Scientific laboratory technician Use: Never used  Substance and Sexual Activity   Alcohol use: No   Drug use: No   Sexual activity: Not on file  Other Topics Concern   Not on file  Social History Narrative   Lives at home with his wife.   Right-handed.   3-4 cups caffeine per day.   Social Determinants of Radio broadcast assistant  Strain: Low Risk  (08/04/2019)   Overall Financial Resource Strain (CARDIA)    Difficulty of Paying Living Expenses: Not hard at all  Food Insecurity: No Food Insecurity (08/04/2019)   Hunger Vital Sign    Worried About Running Out of Food in the Last Year: Never true    Ran Out of Food in the Last Year: Never true  Transportation Needs: No Transportation Needs (08/04/2019)   PRAPARE - Hydrologist (Medical): No    Lack of Transportation (Non-Medical): No  Physical Activity: Insufficiently Active (08/04/2019)   Exercise Vital Sign    Days of Exercise per Week: 2 days    Minutes of Exercise per Session: 20 min  Stress: No Stress Concern Present (08/04/2019)   Lares    Feeling of Stress : Not at all  Social Connections: Moderately Integrated (08/04/2019)   Social  Connection and Isolation Panel [NHANES]    Frequency of Communication with Friends and Family: Twice a week    Frequency of Social Gatherings with Friends and Family: Twice a week    Attends Religious Services: 1 to 4 times per year    Active Member of [employer] or Organizations: No    Attends Music therapist: Never    Marital Status: Married    Family History  Problem Relation Age of Onset   Coronary artery disease Mother    Diabetes Mother    Heart disease Mother        Before age 31 and  CHF   Hypertension Mother    Other Mother        varicose veins   Heart attack Mother    Heart disease Sister    Hypertension Sister    Heart attack Sister    Heart disease Brother    Hyperlipidemia Brother    Hypertension Brother    Other Brother        varicose veins   Heart attack Brother    Heart disease Son 65       Heart Disease before age 45- Open hear surgery   Deep vein thrombosis Son    Heart attack Son    Coronary artery disease Other    Diabetes Daughter    Hypertension Daughter    Other Daughter        varicose veins    Outpatient Encounter Medications as of 09/02/2021  Medication Sig   acetaminophen (TYLENOL) 325 MG tablet Take 2 tablets (650 mg total) by mouth every 6 (six) hours as needed for mild pain (or Fever >/= 101).   albuterol (PROAIR HFA) 108 (90 Base) MCG/ACT inhaler Inhale 2 puffs into the lungs every 6 (six) hours as needed for wheezing or shortness of breath.   aspirin EC 81 MG EC tablet Take 1 tablet (81 mg total) by mouth daily.   carvedilol (COREG) 12.5 MG tablet TAKE 1 TABLET TWICE A DAY   Cholecalciferol (VITAMIN D3) 10 MCG (400 UNIT) CAPS Take 400 Units by mouth daily.   digoxin (LANOXIN) 0.125 MG tablet Take 1 tablet (125 mcg total) by mouth daily.   ENTRESTO 24-26 MG TAKE 1 TABLET TWICE A DAY   ezetimibe (ZETIA) 10 MG tablet Take 10 mg by mouth daily.   fenofibrate 160 MG tablet Take 160 mg by mouth daily.   furosemide (LASIX) 40 MG  tablet TAKE 1 TABLET DAILY (MAINTENANCE DOSE IS NOW 40 MG DAILY)   GARLIC PO Take  by mouth.   glipiZIDE (GLIPIZIDE XL) 10 MG 24 hr tablet Take 1 tablet (10 mg total) by mouth daily with breakfast.   ipratropium (ATROVENT) 0.02 % nebulizer solution Take 2.5 mLs (0.5 mg dose) by nebulization 2 (two) times daily.   linagliptin (TRADJENTA) 5 MG TABS tablet Take 1 tablet (5 mg total) by mouth daily.   Multiple Vitamin (MULTIVITAMIN) tablet Take 1 tablet by mouth daily.   Omega-3 Fatty Acids (FISH OIL) 1200 MG CAPS Take 1,000 mg by mouth daily.    pantoprazole (PROTONIX) 40 MG tablet Take 1 tablet (40 mg total) by mouth daily.   pravastatin (PRAVACHOL) 40 MG tablet Take 80 mg by mouth daily.    UNABLE TO FIND Beet root   warfarin (COUMADIN) 7.5 MG tablet Take 1 tablet (7.5 mg total) by mouth daily.   [DISCONTINUED] glipiZIDE (GLIPIZIDE XL) 10 MG 24 hr tablet Take 1 tablet (10 mg total) by mouth daily with breakfast.   [DISCONTINUED] TRADJENTA 5 MG TABS tablet TAKE 1 TABLET DAILY   No facility-administered encounter medications on file as of 09/02/2021.    ALLERGIES: Allergies  Allergen Reactions   Penicillins Hives, Other (See Comments) and Rash    Blisters on hands Blisters on hands  Blisters on hands Blisters on hands Blisters on hands Blisters on hands   Other Other (See Comments)    Joint and muscle pain and low energy. Joint and muscle pain and low energy.   Statins Other (See Comments)    Joint and muscle pain and low energy.   Penicillin G Sodium     Other reaction(s): hives   Spironolactone     Hyperkalemia Hyperkalemia Hyperkalemia   Statins Support [Acid Blockers Support]     Other reaction(s): weakness    VACCINATION STATUS:  There is no immunization history on file for this patient.  Diabetes He presents for his follow-up diabetic visit. He has type 2 diabetes mellitus. Onset time: Diagnosed at approx age of 88. His disease course has been improving. There are no  hypoglycemic associated symptoms. Pertinent negatives for diabetes include no blurred vision, no fatigue and no foot paresthesias. There are no hypoglycemic complications. Symptoms are stable. Diabetic complications include a CVA, heart disease and nephropathy. Risk factors for coronary artery disease include diabetes mellitus, dyslipidemia, hypertension, male sex and sedentary lifestyle. Current diabetic treatment includes oral agent (dual therapy). He is compliant with treatment most of the time. His weight is fluctuating minimally. He is following a generally healthy diet. When asked about meal planning, he reported none. He has not had a previous visit with a dietitian. He rarely participates in exercise. His home blood glucose trend is decreasing steadily. His breakfast blood glucose range is generally 110-130 mg/dl. His bedtime blood glucose range is generally 130-140 mg/dl. (He presents today with his glucose meter and logs showing at target fasting and postprandial readings.  His POCT A1c today is 7.3%, improving from last visit of 8.2%.  He denies any hypoglycemia.) An ACE inhibitor/angiotensin II receptor blocker is contraindicated. He does not see a podiatrist.Eye exam is current.  Hypertension This is a chronic problem. The current episode started more than 1 year ago. The problem has been resolved since onset. The problem is controlled. Pertinent negatives include no blurred vision. There are no associated agents to hypertension. Risk factors for coronary artery disease include diabetes mellitus, dyslipidemia, male gender and sedentary lifestyle. Past treatments include beta blockers and diuretics. The current treatment provides moderate improvement. There are no  compliance problems.  Hypertensive end-organ damage includes kidney disease, CAD/MI, CVA and heart failure. Identifiable causes of hypertension include chronic renal disease and renovascular disease.  Hyperlipidemia This is a chronic  problem. The current episode started more than 1 year ago. The problem is controlled. Recent lipid tests were reviewed and are normal. Exacerbating diseases include chronic renal disease, diabetes and nephrotic syndrome. Factors aggravating his hyperlipidemia include beta blockers and fatty foods. Current antihyperlipidemic treatment includes statins. There are no compliance problems.  Risk factors for coronary artery disease include diabetes mellitus, dyslipidemia, hypertension, male sex and a sedentary lifestyle.    Review of systems  Constitutional: + Minimally fluctuating body weight,  current Body mass index is 24.53 kg/m. , no fatigue, no subjective hyperthermia, no subjective hypothermia Eyes: no blurry vision, no xerophthalmia ENT: no sore throat, no nodules palpated in throat, no dysphagia/odynophagia, no hoarseness Cardiovascular: no chest pain, no shortness of breath, no palpitations, no leg swelling Respiratory: no cough, no shortness of breath Gastrointestinal: no nausea/vomiting/diarrhea Musculoskeletal: no muscle/joint aches Skin: no rashes, no hyperemia Neurological: no tremors, no numbness, no tingling, no dizziness Psychiatric: no depression, no anxiety  Objective:     BP (!) 142/80   Pulse 60   Ht '5\' 6"'$  (1.676 m)   Wt 152 lb (68.9 kg)   BMI 24.53 kg/m   Wt Readings from Last 3 Encounters:  09/02/21 152 lb (68.9 kg)  04/30/21 151 lb 9.6 oz (68.8 kg)  10/30/20 148 lb 6.4 oz (67.3 kg)    BP Readings from Last 3 Encounters:  09/02/21 (!) 142/80  04/30/21 137/63  10/30/20 117/73     Physical Exam- Limited  Constitutional:  Body mass index is 24.53 kg/m. , not in acute distress, normal state of mind Eyes:  EOMI, no exophthalmos Neck: Supple Cardiovascular: RRR, no murmurs, rubs, or gallops, no edema Respiratory: Adequate breathing efforts, no crackles, rales, rhonchi, or wheezing Musculoskeletal: no gross deformities, strength intact in all four extremities,  no gross restriction of joint movements Skin:  no rashes, no hyperemia Neurological: no tremor with outstretched hands    CMP ( most recent) CMP     Component Value Date/Time   NA 135 (A) 06/11/2021 0000   K 4.6 06/11/2021 0000   CL 99 06/11/2021 0000   CO2 21 06/11/2021 0000   GLUCOSE 274 (H) 08/04/2019 1449   BUN 33 (A) 06/11/2021 0000   CREATININE 1.3 06/11/2021 0000   CREATININE 1.36 (H) 08/04/2019 1449   CALCIUM 9.8 06/11/2021 0000   PROT 6.9 08/04/2019 1449   ALBUMIN 4.2 06/11/2021 0000   AST 26 06/11/2021 0000   ALT 20 06/11/2021 0000   ALKPHOS 39 06/11/2021 0000   BILITOT 0.5 08/04/2019 1449   GFRNONAA 45 02/02/2020 0000   GFRAA 57 (L) 08/04/2019 1449     Diabetic Labs (most recent): Lab Results  Component Value Date   HGBA1C 7.3 09/02/2021   HGBA1C 8.2 (A) 04/30/2021   HGBA1C 6.9 (A) 10/30/2020     Lipid Panel ( most recent) Lipid Panel     Component Value Date/Time   CHOL 121 06/11/2021 0000   TRIG 316 (A) 06/11/2021 0000   HDL 25 (A) 06/11/2021 0000   CHOLHDL 5.6 07/25/2018 0443   VLDL 23 07/25/2018 0443   LDLCALC 48 06/11/2021 0000      Lab Results  Component Value Date   TSH 0.997 06/16/2014           Assessment & Plan:   1) Type  2 Diabetes with CKD stage 3a  - Oscar [name] has currently uncontrolled symptomatic type 2 DM since 81 years of age.  He presents today with his glucose meter and logs showing at target fasting and postprandial readings.  His POCT A1c today is 7.3%, improving from last visit of 8.2%.  He denies any hypoglycemia.  -Recent labs reviewed.  - I had a long discussion with him about the progressive nature of diabetes and the pathology behind its complications. -his diabetes is complicated by TIA, MI, CHF, CAD, CKD and he remains at a high risk for more acute and chronic complications which include retinopathy, and neuropathy. These are all discussed in detail with him.  - Nutritional counseling repeated at  each appointment due to patients tendency to fall back in to old habits.  - The patient admits there is a room for improvement in their diet and drink choices. -  Suggestion is made for the patient to avoid simple carbohydrates from their diet including Cakes, Sweet Desserts / Pastries, Ice Cream, Soda (diet and regular), Sweet Tea, Candies, Chips, Cookies, Sweet Pastries, Store Bought Juices, Alcohol in Excess of 1-2 drinks a day, Artificial Sweeteners, Coffee Creamer, and "Sugar-free" Products. This will help patient to have stable blood glucose profile and potentially avoid unintended weight gain.   - I encouraged the patient to switch to unprocessed or minimally processed complex starch and increased protein intake (animal or plant source), fruits, and vegetables.   - Patient is advised to stick to a routine mealtimes to eat 3 meals a day and avoid unnecessary snacks (to snack only to correct hypoglycemia).  - I have approached him with the following individualized plan to manage  his diabetes and patient agrees:   Avoiding hypoglycemia is the #1 priority in his care given his age and comorbidities.  An appropriate A1c goal for him would be 7.0-7.5%.  -he is encouraged to continue monitoring blood glucose twice daily, before breakfast and before bed, and to call the clinic if he has readings less than 70 or greater than 300 for 3 tests in a row.  - Based on at goal glycemic profile, he is advised to continue Tradjenta 5 mg po daily and Glipizide 10 mg XL daily with breakfast.    - he is not a candidate for Metformin, SGLT2 inhibitors due to concurrent renal insufficiency.  -If he cannot gain control of diabetes with Tradjenta and Glipizide along with diet and exercise, may need to consider adding basal insulin, which is something he would like to avoid if possible.  - Specific targets for  A1c;  LDL, HDL,  and Triglycerides were discussed with the patient.  2) Blood Pressure  /Hypertension:  his blood pressure is controlled to target.   he is advised to continue his current medications including Coreg 12.5 mg po twice daily, Lasix 40 mg po daily, and Entresto 24-26 mg po daily.  3) Lipids/Hyperlipidemia:    His most recent lipid panel from 06/11/21 shows controlled LDL of 48 and elevated triglycerides of 2316.  He is advised to continue Pravastatin 40 mg po daily at bedtime, Fish Oil, Fenofibrate, and Zetia.  Side effects and precautions discussed with him.   4)  Weight/Diet:  his Body mass index is 24.53 kg/m.  - He is not a candidate for weight loss. Exercise, and detailed carbohydrates information provided  -  detailed on discharge instructions.  5) Chronic Care/Health Maintenance: -he is on Statin medications and is encouraged to initiate  and continue to follow up with Ophthalmology, Dentist, Podiatrist at least yearly or according to recommendations, and advised to stay away from smoking. I have recommended yearly flu vaccine and pneumonia vaccine at least every 5 years; moderate intensity exercise for up to 150 minutes weekly; and sleep for at least 7 hours a day.   - he is advised to maintain close follow up with Adaline Sill, NP for primary care needs, as well as his other providers for optimal and coordinated care.     I spent 30 minutes in the care of the patient today including review of labs from [place], Lipids, Thyroid Function, Hematology (current and previous including abstractions from other facilities); face-to-face time discussing  his blood glucose readings/logs, discussing hypoglycemia and hyperglycemia episodes and symptoms, medications doses, his options of short and long term treatment based on the latest standards of care / guidelines;  discussion about incorporating lifestyle medicine;  and documenting the encounter. Risk reduction counseling performed per USPSTF guidelines to reduce obesity and cardiovascular risk factors.     Please refer  to Patient Instructions for Blood Glucose Monitoring and Insulin/Medications Dosing Guide"  in media tab for additional information. Please  also refer to " Patient Self Inventory" in the Media  tab for reviewed elements of pertinent patient history.  Wynn Maudlin participated in the discussions, expressed understanding, and voiced agreement with the above plans.  All questions were answered to his satisfaction. he is encouraged to contact clinic should he have any questions or concerns prior to his return visit.   Follow up plan: - Return in about 6 months (around 03/05/2022) for Diabetes F/U with A1c in office, Bring meter and logs, No previsit labs.  Rayetta Pigg, [facility] [address] [place], [place] [address] Phone: [phone] Fax: [phone]  09/02/2021, 2:11 PM

## 2021-09-16 ENCOUNTER — Other Ambulatory Visit: Payer: Self-pay | Admitting: Cardiology

## 2021-09-23 ENCOUNTER — Telehealth: Payer: Self-pay | Admitting: Student

## 2021-09-23 MED ORDER — DIGOXIN 125 MCG PO TABS
125.0000 ug | ORAL_TABLET | Freq: Every day | ORAL | 1 refills | Status: DC
Start: 2021-09-23 — End: 2021-10-18

## 2021-09-23 NOTE — Telephone Encounter (Signed)
 *  STAT* If patient is at the pharmacy, call can be transferred to refill team.   1. Which medications need to be refilled? (please list name of each medication and dose if known) digoxin (LANOXIN) 0.125 MG tablet  2. Which pharmacy/location (including street and city if local pharmacy) is medication to be sent to? [place], [place]  3. Do they need a 30 day or 90 day supply? 90 days  Pt is out of meds, needs refill today

## 2021-09-23 NOTE — Telephone Encounter (Signed)
Completed.

## 2021-10-18 ENCOUNTER — Other Ambulatory Visit: Payer: Self-pay

## 2021-10-18 MED ORDER — DIGOXIN 125 MCG PO TABS: 125.0000 ug | ORAL_TABLET | Freq: Every day | ORAL | 1 refills | Status: DC

## 2021-10-28 ENCOUNTER — Other Ambulatory Visit: Payer: Self-pay | Admitting: Cardiology

## 2021-11-05 ENCOUNTER — Encounter: Payer: Self-pay | Admitting: *Deleted

## 2021-11-05 ENCOUNTER — Encounter: Payer: Self-pay | Admitting: Student

## 2021-11-05 ENCOUNTER — Ambulatory Visit: Payer: Medicare Other | Attending: Student | Admitting: Student

## 2021-11-05 VITALS — BP 132/78 | HR 72 | Ht 66.0 in | Wt 149.8 lb

## 2021-11-05 DIAGNOSIS — I779 Disorder of arteries and arterioles, unspecified: Secondary | ICD-10-CM

## 2021-11-05 DIAGNOSIS — I4821 Permanent atrial fibrillation: Secondary | ICD-10-CM

## 2021-11-05 DIAGNOSIS — I5042 Chronic combined systolic (congestive) and diastolic (congestive) heart failure: Secondary | ICD-10-CM

## 2021-11-05 DIAGNOSIS — I2510 Atherosclerotic heart disease of native coronary artery without angina pectoris: Secondary | ICD-10-CM | POA: Diagnosis not present

## 2021-11-05 DIAGNOSIS — N1832 Chronic kidney disease, stage 3b: Secondary | ICD-10-CM

## 2021-11-05 MED ORDER — ENTRESTO 24-26 MG PO TABS
1.0000 | ORAL_TABLET | Freq: Two times a day (BID) | ORAL | 3 refills | Status: DC
Start: 2021-11-05 — End: 2023-02-03

## 2021-11-05 MED ORDER — DIGOXIN 125 MCG PO TABS: 125.0000 ug | ORAL_TABLET | Freq: Every day | ORAL | 3 refills | Status: DC

## 2021-11-05 MED ORDER — CARVEDILOL 12.5 MG PO TABS
12.5000 mg | ORAL_TABLET | Freq: Two times a day (BID) | ORAL | 3 refills | Status: DC
Start: 2021-11-05 — End: 2023-02-03

## 2021-11-05 NOTE — Patient Instructions (Signed)
Medication Instructions:  Your physician recommends that you continue on your current medications as directed. Please refer to the Current Medication list given to you today.  *If you need a refill on your cardiac medications before your next appointment, please call your pharmacy*   Lab Work: NONE   If you have labs (blood work) drawn today and your tests are completely normal, you will receive your results only by: [place] (if you have MyChart) OR A paper copy in the mail If you have any lab test that is abnormal or we need to change your treatment, we will call you to review the results.   Testing/Procedures: NONE    Follow-Up: At [facility], you and your health needs are our priority.  As part of our continuing mission to provide you with exceptional heart care, we have created designated Provider Care Teams.  These Care Teams include your primary Cardiologist (physician) and Advanced Practice Providers (APPs -  Physician Assistants and Nurse Practitioners) who all work together to provide you with the care you need, when you need it.  We recommend signing up for the patient portal called "MyChart".  Sign up information is provided on this After Visit Summary.  MyChart is used to connect with patients for Virtual Visits (Telemedicine).  Patients are able to view lab/test results, encounter notes, upcoming appointments, etc.  Non-urgent messages can be sent to your provider as well.   To learn more about what you can do with MyChart, go to [email or web address].    Your next appointment:   4-5 month(s)  The format for your next appointment:   In Person  Provider:   You may see Minus Breeding, MD or one of the following Advanced Practice Providers on your designated Care Team:   Bernerd Pho, PA-C  Ermalinda Barrios, [place]     Other Instructions Thank you for choosing [facility]!    Important Information About Sugar

## 2021-11-05 NOTE — Progress Notes (Signed)
Cardiology Office Note    Date:  11/05/2021   ID:  Oscar [name], DOB 03-27-1940, MRN [id number]  PCP:  Adaline Sill, NP  Cardiologist: Minus Breeding, MD    Chief Complaint  Patient presents with   Follow-up    6 month visit    History of Present Illness:    Oscar [name] is a 81 y.o. male  with past medical history of HFrEF (EF 25-30% by echo in 07/2018, at 35-40% by repeat echo in 11/2018), CAD (catheterization in 07/2018 showing 100% Proximal-RCA stenosis, 99% Proximal-LAD, 75% Ost LM, 70% LCx, 40% PDA, and 30% Ost LCx with patient declining CABG along with declining arthrectomy with Impella support due to high-risk status), permanent atrial fibrillation, carotid artery stenosis (s/p L CEA in 2007 and R CEA in 07/2018), HTN, HLD and Stage 3 CKD who presents to the office today for overdue 65-monthfollow-up.  He was last examined by myself in 07/2020 and was remaining active around his home and denied any recent anginal symptoms. He was continued on his current cardiac medications including Coreg, Digoxin, Lasix and Entresto as Spironolactone had previously been discontinued due to hyperkalemia and he was no longer on Jardiance per Endocrinology given his variable renal function.   In talking with the patient today, he reports overall doing well since his last visit. He feels blessed to still be alive given his known significant CAD. He remains active at baseline and does yard work and [place] [employer] and denies any chest pain or dyspnea on exertion with these activities. No recent orthopnea or PND. He was previously having issues with lower extremity edema but says this improved when Lasix was titrated to '40mg'$  daily.   Past Medical History:  Diagnosis Date   Anemia    Atrial fibrillation (HCC)    permanent   CAD (coronary artery disease)    a. cath on 07/29/2018 showed 100% Proximal-RCA stenosis, 99% Proximal-LAD, 75% Ost LM, 70% LCx, 40% PDA, and 30% Ost  LCx. Declined CABG   Cardiomyopathy    EF 35% in the past--Improved to 55% echo 2/10. Moderately severe mitral regurgitation in the past, now improved,   Carotid artery stenosis    CHF (congestive heart failure) [place]   COPD [place] obstructive pulmonary disease) [place]    Coronary artery disease    non obstructive   Diabetes mellitus    Dyslipidemia    History of tobacco abuse    Nephrolithiasis    Tremors of nervous system     Past Surgical History:  Procedure Laterality Date   CAROTID ENDARTERECTOMY Left 10-21-05   cea   CATARACT EXTRACTION W/PHACO Left 09/10/2018   Procedure: CATARACT EXTRACTION PHACO AND INTRAOCULAR LENS PLACEMENT LEFT EYE;  Surgeon: WBaruch Goldmann MD;  Location: [facility];  Service: Ophthalmology;  Laterality: Left;  left   CATARACT EXTRACTION W/PHACO Right 11/12/2018   Procedure: CATARACT EXTRACTION PHACO AND INTRAOCULAR LENS PLACEMENT RIGHT EYE;  Surgeon: WBaruch Goldmann MD;  Location: [facility];  Service: Ophthalmology;  Laterality: Right;  right   ENDARTERECTOMY Right 08/11/2018   Procedure: ENDARTERECTOMY CAROTID RIGHT;  Surgeon: ERosetta Posner MD;  [facility];  Service: Vascular;  Laterality: Right;   left carotid endarterectomy     PATCH ANGIOPLASTY Right 08/11/2018   Procedure: PATCH ANGIOPLAST USING HEMASHIELD PLATINUM FINESSE;  Surgeon: ERosetta Posner MD;  Location: [facility];  Service: Vascular;  Laterality: Right;   RIGHT/LEFT HEART CATH AND CORONARY ANGIOGRAPHY  N/A 07/29/2018   Procedure: RIGHT/LEFT HEART CATH AND CORONARY ANGIOGRAPHY;  Surgeon: Jolaine Artist, MD;  Location: [facility];  Service: Cardiovascular;  Laterality: N/A;   TONSILLECTOMY      Current Medications: Outpatient Medications Prior to Visit  Medication Sig Dispense Refill   acetaminophen (TYLENOL) 325 MG tablet Take 2 tablets (650 mg total) by mouth every 6 (six) hours as needed for mild pain (or Fever >/= 101). 30 tablet 0   albuterol (PROAIR HFA) 108 (90 Base)  MCG/ACT inhaler Inhale 2 puffs into the lungs every 6 (six) hours as needed for wheezing or shortness of breath.     aspirin EC 81 MG EC tablet Take 1 tablet (81 mg total) by mouth daily. 30 tablet 0   Cholecalciferol (VITAMIN D3) 10 MCG (400 UNIT) CAPS Take 400 Units by mouth daily.     ezetimibe (ZETIA) 10 MG tablet Take 10 mg by mouth daily.     fenofibrate 160 MG tablet Take 160 mg by mouth daily.     furosemide (LASIX) 40 MG tablet TAKE 1 TABLET DAILY (MAINTENANCE DOSE IS NOW 40 MG DAILY) 90 tablet 3   GARLIC PO Take by mouth.     glipiZIDE (GLIPIZIDE XL) 10 MG 24 hr tablet Take 1 tablet (10 mg total) by mouth daily with breakfast. 90 tablet 3   ipratropium (ATROVENT) 0.02 % nebulizer solution Take 2.5 mLs (0.5 mg dose) by nebulization 2 (two) times daily.     linagliptin (TRADJENTA) 5 MG TABS tablet Take 1 tablet (5 mg total) by mouth daily. 90 tablet 3   Multiple Vitamin (MULTIVITAMIN) tablet Take 1 tablet by mouth daily.     Omega-3 Fatty Acids (FISH OIL) 1200 MG CAPS Take 1,000 mg by mouth daily.      pantoprazole (PROTONIX) 40 MG tablet Take 1 tablet (40 mg total) by mouth daily. 30 tablet 0   pravastatin (PRAVACHOL) 40 MG tablet Take 80 mg by mouth daily.      UNABLE TO FIND Beet root     warfarin (COUMADIN) 7.5 MG tablet Take 1 tablet (7.5 mg total) by mouth daily.     carvedilol (COREG) 12.5 MG tablet TAKE 1 TABLET TWICE A DAY (PLEASE SCHEDULE AN APPOINTMENT FOR FURTHER REFILLS) 60 tablet 1   digoxin (LANOXIN) 0.125 MG tablet Take 1 tablet (125 mcg total) by mouth daily. 30 tablet 1   ENTRESTO 24-26 MG TAKE 1 TABLET TWICE A DAY 180 tablet 3   No facility-administered medications prior to visit.     Allergies:   Penicillins, Other, Statins, Penicillin g sodium, Spironolactone, and Statins support [acid blockers support]   Social History   Socioeconomic History   Marital status: Married    Spouse name: Not on file   Number of children: 5   Years of education: 12   Highest  education level: Not on file  Occupational History   Occupation: Retired  Tobacco Use   Smoking status: Former    Types: Cigarettes    Quit date: 02/25/2000    Years since quitting: 21.7   Smokeless tobacco: Former  Scientific laboratory technician Use: Never used  Substance and Sexual Activity   Alcohol use: No   Drug use: No   Sexual activity: Not on file  Other Topics Concern   Not on file  Social History Narrative   Lives at home with his wife.   Right-handed.   3-4 cups caffeine per day.   Social Determinants of  Health   Financial Resource Strain: Low Risk  (08/04/2019)   Overall Financial Resource Strain (CARDIA)    Difficulty of Paying Living Expenses: Not hard at all  Food Insecurity: No Food Insecurity (08/04/2019)   Hunger Vital Sign    Worried About Running Out of Food in the Last Year: Never true    Ran Out of Food in the Last Year: Never true  Transportation Needs: No Transportation Needs (08/04/2019)   PRAPARE - Hydrologist (Medical): No    Lack of Transportation (Non-Medical): No  Physical Activity: Insufficiently Active (08/04/2019)   Exercise Vital Sign    Days of Exercise per Week: 2 days    Minutes of Exercise per Session: 20 min  Stress: No Stress Concern Present (08/04/2019)   [place]    Feeling of Stress : Not at all  Social Connections: Moderately Integrated (08/04/2019)   Social Connection and Isolation Panel [NHANES]    Frequency of Communication with Friends and Family: Twice a week    Frequency of Social Gatherings with Friends and Family: Twice a week    Attends Religious Services: 1 to 4 times per year    Active Member of [employer] or Organizations: No    Attends Music therapist: Never    Marital Status: Married     Family History:  The patient's family history includes Coronary artery disease in his mother and another family member; Deep vein  thrombosis in his son; Diabetes in his daughter and mother; Heart attack in his brother, mother, sister, and son; Heart disease in his brother, mother, and sister; Heart disease (age of onset: 74) in his son; Hyperlipidemia in his brother; Hypertension in his brother, daughter, mother, and sister; Other in his brother, daughter, and mother.   Review of Systems:    Please see the history of present illness.     All other systems reviewed and are otherwise negative except as noted above.   Physical Exam:    VS:  BP 132/78   Pulse 72   Ht '5\' 6"'$  (1.676 m)   Wt 149 lb 12.8 oz (67.9 kg)   SpO2 96%   BMI 24.18 kg/m    General: Pleasant elderly male appearing in no acute distress. Head: Normocephalic, atraumatic. Neck: No carotid bruits. JVD not elevated.  Lungs: Respirations regular and unlabored, without wheezes or rales.  Heart: Irregularly irregular. No S3 or S4.  No murmur, no rubs, or gallops appreciated. Abdomen: Appears non-distended. No obvious abdominal masses. Msk:  Strength and tone appear normal for age. No obvious joint deformities or effusions. Extremities: No clubbing or cyanosis. No pitting edema.  Distal pedal pulses are 2+ bilaterally. Neuro: Alert and oriented X 3. Moves all extremities spontaneously. No focal deficits noted. Psych:  Responds to questions appropriately with a normal affect. Skin: No rashes or lesions noted  Wt Readings from Last 3 Encounters:  11/05/21 149 lb 12.8 oz (67.9 kg)  09/02/21 152 lb (68.9 kg)  04/30/21 151 lb 9.6 oz (68.8 kg)     Studies/Labs Reviewed:   EKG:  EKG is ordered today. The ekg ordered today demonstrates rate controlled atrial fibrillation, heart rate 72 with LPFB and ST abnormalities along the inferior leads which is overall similar to prior tracings.  Recent Labs: 06/11/2021: ALT 20; BUN 33; Creatinine 1.3; Hemoglobin 13.2; Platelets 345; Potassium 4.6; Sodium 135   Lipid Panel    Component Value Date/Time  CHOL 121  06/11/2021 0000   TRIG 316 (A) 06/11/2021 0000   HDL 25 (A) 06/11/2021 0000   CHOLHDL 5.6 07/25/2018 0443   VLDL 23 07/25/2018 0443   LDLCALC 48 06/11/2021 0000    Additional studies/ records that were reviewed today include:   R/LHC: 07/2018 Prox RCA lesion is 100% stenosed. Prox LAD lesion is 99% stenosed. Ost LM lesion is 75% stenosed. Prox Cx to Mid Cx lesion is 70% stenosed. LPDA lesion is 40% stenosed. Ost Cx to Prox Cx lesion is 30% stenosed.   Findings:   Ao = 148/81 (109)  LV = 13215 RA = 7 RV = 59/6 PA = 59/24 (37) PCW = 22 Fick cardiac output/index = 4.1/2.3 PVR = 3.3 WU Ao sat = 93% PA sat = 62%, 64%   Assessment: 1. Severe 3v CAD in left dominant system with 70-80% LM lesion and 99% proximal LAD lesion 2. Ischemic CM EF 25% 3. Mild to moderate pulmonary HTN 4. Moderately reduced CO   Plan/Discussion:   Will d/w with TCTS regarding possible CABG and [place]. If not surgical candidate will need PCI of LAD with atherectomy +/- LM PCI.    I discussed with his wife by phone as well.  Echocardiogram: 11/2018 IMPRESSIONS     1. Left ventricular ejection fraction, by visual estimation, is 35 to  40%. The left ventricle has normal function. Mildly increased left  ventricular size. There is mildly increased left ventricular hypertrophy.   2. Septal apical and inferior wall hypokinesis.   3. Global right ventricle has normal systolic function.The right  ventricular size is normal. No increase in right ventricular wall  thickness.   4. Left atrial size was moderately dilated.   5. Right atrial size was moderately dilated.   6. Moderate calcification of the mitral valve leaflet(s).   7. Moderate mitral annular calcification.   8. Moderate thickening of the mitral valve leaflet(s).   9. The mitral valve is normal in structure. Mild mitral valve  regurgitation. No evidence of mitral stenosis.  10. The tricuspid valve is normal in structure. Tricuspid valve   regurgitation is mild.  11. The aortic valve is tricuspid Aortic valve regurgitation is mild by  color flow Doppler. Mild aortic valve sclerosis without stenosis.  12. The pulmonic valve was normal in structure. Pulmonic valve  regurgitation is mild by color flow Doppler.  13. Moderately elevated pulmonary artery systolic pressure.  14. The inferior vena cava is normal in size with greater than 50%  respiratory variability, suggesting right atrial pressure of 3 mmHg.  Assessment:    1. Chronic combined systolic and diastolic heart failure ([place])   2. Coronary artery disease involving native coronary artery of native heart without angina pectoris   3. Permanent atrial fibrillation ([place])   4. Bilateral carotid artery disease, unspecified type ([place])   5. Stage 3b chronic kidney disease ([place])      Plan:   In order of problems listed above:  1. HFrEF - His EF was previously at 25-30% by echo in 07/2018, at 35-40% by repeat echo in 11/2018. He has been doing remarkably well over the past few years given his known CAD and CHF. Breathing has been stable and he is able to perform his routine activities without anginal symptoms.  - Will continue current medical therapy with Coreg 12.'5mg'$  BID, Digoxin 0.'125mg'$  daily, Entresto 24-'26mg'$  BID and Lasix '40mg'$  daily. Will request most recent labs from his PCP to make sure a Digoxin level has  been obtained.He previously had hyperkalemia with Spironolactone and SGLT2 inhibitor was previously discontinued by Endocrinology given his variable renal function. We discussed the possibility of a repeat echo but at this time, it would not change his management strategy as he prefers to continue to his current medication regimen given how well he has been feeling.   2. CAD - He has known multivessel CAD by catheterization in 07/2018 as outlined above and declined CABG at that time and declined atherectomy with Impella support due to the high-risk nature of the  procedure. - He denies any recent anginal symptoms.  - Continue ASA '81mg'$  daily, Coreg 12.'5mg'$  BID, Zetia '10mg'$  daily and Pravastatin '80mg'$  daily. LDL was at 48 in 05/2021 and he was previously intolerant to higher-intensity statin therapy.   3. Permanent Atrial Fibrillation - He denies any recent palpitations and HR is well-controlled today. Continue Coreg for rate-control.  - No reports of active bleeding. Remains on Coumadin for anticoagulation which is followed by his PCP.   4. Carotid Artery Stenosis - He is s/p L CEA in 2007 and R CEA in 07/2018. Dopplers in 08/2020 showed 1 to 39% stenosis bilaterally. Remains on ASA and statin therapy.   5. Stage III CKD - Followed by Dr. Theador Hawthorne. Creatinine was stable at 1.39 by recent labs on 10/30/2021.    Medication Adjustments/Labs and Tests Ordered: Current medicines are reviewed at length with the patient today.  Concerns regarding medicines are outlined above.  Medication changes, Labs and Tests ordered today are listed in the Patient Instructions below. Patient Instructions  Medication Instructions:  Your physician recommends that you continue on your current medications as directed. Please refer to the Current Medication list given to you today.  *If you need a refill on your cardiac medications before your next appointment, please call your pharmacy*   Lab Work: NONE   If you have labs (blood work) drawn today and your tests are completely normal, you will receive your results only by: [place] (if you have MyChart) OR A paper copy in the mail If you have any lab test that is abnormal or we need to change your treatment, we will call you to review the results.   Testing/Procedures: NONE    Follow-Up: At [facility], you and your health needs are our priority.  As part of our continuing mission to provide you with exceptional heart care, we have created designated Provider Care Teams.  These Care Teams include your  primary Cardiologist (physician) and Advanced Practice Providers (APPs -  Physician Assistants and Nurse Practitioners) who all work together to provide you with the care you need, when you need it.  We recommend signing up for the patient portal called "MyChart".  Sign up information is provided on this After Visit Summary.  MyChart is used to connect with patients for Virtual Visits (Telemedicine).  Patients are able to view lab/test results, encounter notes, upcoming appointments, etc.  Non-urgent messages can be sent to your provider as well.   To learn more about what you can do with MyChart, go to [email or web address].    Your next appointment:   4-5 month(s)  The format for your next appointment:   In Person  Provider:   You may see Minus Breeding, MD or one of the following Advanced Practice Providers on your designated Care Team:   Bernerd Pho, PA-C  Ermalinda Barrios, [place]     Other Instructions Thank you for choosing [facility]!    Important  Information About Sugar         Signed, Erma Heritage, PA-C  11/05/2021 9:55 PM    [place] [address] [place], [place] [address] Phone: [phone] Fax: [phone]

## 2021-12-06 ENCOUNTER — Inpatient Hospital Stay: Payer: Medicare Other | Attending: Hematology | Admitting: Hematology

## 2021-12-06 ENCOUNTER — Other Ambulatory Visit: Payer: Self-pay

## 2021-12-06 ENCOUNTER — Inpatient Hospital Stay: Payer: Medicare Other

## 2021-12-06 VITALS — BP 136/69 | HR 56 | Temp 98.1°F | Resp 18 | Ht 66.0 in | Wt 148.3 lb

## 2021-12-06 DIAGNOSIS — D72829 Elevated white blood cell count, unspecified: Secondary | ICD-10-CM | POA: Diagnosis present

## 2021-12-06 DIAGNOSIS — R778 Other specified abnormalities of plasma proteins: Secondary | ICD-10-CM

## 2021-12-06 DIAGNOSIS — N183 Chronic kidney disease, stage 3 unspecified: Secondary | ICD-10-CM | POA: Diagnosis not present

## 2021-12-06 LAB — CBC WITH DIFFERENTIAL/PLATELET
Abs Immature Granulocytes: 0 10*3/uL (ref 0.00–0.07)
Basophils Absolute: 0.1 10*3/uL (ref 0.0–0.1)
Basophils Relative: 1 %
Eosinophils Absolute: 0.7 10*3/uL — ABNORMAL HIGH (ref 0.0–0.5)
Eosinophils Relative: 5 %
HCT: 40.9 % (ref 39.0–52.0)
Hemoglobin: 13.7 g/dL (ref 13.0–17.0)
Lymphocytes Relative: 41 %
Lymphs Abs: 5.4 10*3/uL — ABNORMAL HIGH (ref 0.7–4.0)
MCH: 29.9 pg (ref 26.0–34.0)
MCHC: 33.5 g/dL (ref 30.0–36.0)
MCV: 89.3 fL (ref 80.0–100.0)
Monocytes Absolute: 0.4 10*3/uL (ref 0.1–1.0)
Monocytes Relative: 3 %
Neutro Abs: 6.6 10*3/uL (ref 1.7–7.7)
Neutrophils Relative %: 50 %
Platelets: 320 10*3/uL (ref 150–400)
RBC: 4.58 MIL/uL (ref 4.22–5.81)
RDW: 14.8 % (ref 11.5–15.5)
WBC Morphology: ABNORMAL
WBC: 13.2 10*3/uL — ABNORMAL HIGH (ref 4.0–10.5)
nRBC: 0 % (ref 0.0–0.2)

## 2021-12-06 LAB — COMPREHENSIVE METABOLIC PANEL
ALT: 25 U/L (ref 0–44)
AST: 27 U/L (ref 15–41)
Albumin: 4.1 g/dL (ref 3.5–5.0)
Alkaline Phosphatase: 36 U/L — ABNORMAL LOW (ref 38–126)
Anion gap: 10 (ref 5–15)
BUN: 39 mg/dL — ABNORMAL HIGH (ref 8–23)
CO2: 25 mmol/L (ref 22–32)
Calcium: 9.2 mg/dL (ref 8.9–10.3)
Chloride: 100 mmol/L (ref 98–111)
Creatinine, Ser: 1.37 mg/dL — ABNORMAL HIGH (ref 0.61–1.24)
GFR, Estimated: 52 mL/min — ABNORMAL LOW (ref >60)
Glucose, Bld: 243 mg/dL — ABNORMAL HIGH (ref 70–99)
Potassium: 4.2 mmol/L (ref 3.5–5.1)
Sodium: 135 mmol/L (ref 135–145)
Total Bilirubin: 0.5 mg/dL (ref 0.3–1.2)
Total Protein: 6.6 g/dL (ref 6.5–8.1)

## 2021-12-06 LAB — SEDIMENTATION RATE: Sed Rate: 4 mm/hr (ref 0–16)

## 2021-12-06 LAB — C-REACTIVE PROTEIN: CRP: 1.6 mg/dL — ABNORMAL HIGH (ref <1.0)

## 2021-12-06 LAB — LACTATE DEHYDROGENASE: LDH: 169 U/L (ref 98–192)

## 2021-12-06 NOTE — Progress Notes (Signed)
CONSULT NOTE  Patient Care Team: Adaline Sill, NP as PCP - General (Internal Medicine) Minus Breeding, MD as PCP - Cardiology (Cardiology) Minus Breeding, MD (Cardiology) Early, Arvilla Meres, MD as Consulting Physician (Vascular Surgery) Derek Jack, MD as Consulting Physician (Hematology) Derek Jack, MD as Medical Oncologist (Hematology)  CHIEF COMPLAINTS/PURPOSE OF CONSULTATION:  Abnormal SPEP and leukocytosis  HISTORY OF PRESENTING ILLNESS:  Oscar [name] 81 y.o. male is seen in consultation today for further work-up and management of abnormal SPEP and leukocytosis at the request of Dr. Theador Hawthorne.  CBC on 10/30/2021 showed elevated white count of 14.1.  Review of labs showed his white count is elevated consistently since 2022.  Urine immunofixation electrophoresis showed monoclonal free kappa light chains.  He denies any fevers, or other infections.  He is not on any systemic steroids.  Denies any weight loss or night sweats.  No prior history of splenectomy.  No tingling or numbness in extremities.  No prior history of thrombosis.  He quit smoking in 2002 and retired from working in Theatre manager.  No chemical exposure.  No family history of malignancies.    MEDICAL HISTORY:  Past Medical History:  Diagnosis Date   Anemia    Atrial fibrillation (HCC)    permanent   CAD (coronary artery disease)    a. cath on 07/29/2018 showed 100% Proximal-RCA stenosis, 99% Proximal-LAD, 75% Ost LM, 70% LCx, 40% PDA, and 30% Ost LCx. Declined CABG   Cardiomyopathy    EF 35% in the past--Improved to 55% echo 2/10. Moderately severe mitral regurgitation in the past, now improved,   Carotid artery stenosis    CHF (congestive heart failure) ([place]) 2002   COPD (chronic obstructive pulmonary disease) ([place])    Coronary artery disease    non obstructive   Diabetes mellitus    Dyslipidemia    History of tobacco abuse    Nephrolithiasis    Tremors of nervous system     SURGICAL  HISTORY: Past Surgical History:  Procedure Laterality Date   CAROTID ENDARTERECTOMY Left 10-21-05   cea   CATARACT EXTRACTION W/PHACO Left 09/10/2018   Procedure: CATARACT EXTRACTION PHACO AND INTRAOCULAR LENS PLACEMENT LEFT EYE;  Surgeon: Baruch Goldmann, MD;  Location: [facility];  Service: Ophthalmology;  Laterality: Left;  left   CATARACT EXTRACTION W/PHACO Right 11/12/2018   Procedure: CATARACT EXTRACTION PHACO AND INTRAOCULAR LENS PLACEMENT RIGHT EYE;  Surgeon: Baruch Goldmann, MD;  Location: [facility];  Service: Ophthalmology;  Laterality: Right;  right   ENDARTERECTOMY Right 08/11/2018   Procedure: ENDARTERECTOMY CAROTID RIGHT;  Surgeon: [name], MD;  Location: [facility] OR;  Service: Vascular;  Laterality: Right;   left carotid endarterectomy     PATCH ANGIOPLASTY Right 08/11/2018   Procedure: PATCH ANGIOPLAST USING HEMASHIELD PLATINUM FINESSE;  Surgeon: [name], MD;  Location: [facility] OR;  Service: Vascular;  Laterality: Right;   RIGHT/LEFT HEART CATH AND CORONARY ANGIOGRAPHY N/A 07/29/2018   Procedure: RIGHT/LEFT HEART CATH AND CORONARY ANGIOGRAPHY;  Surgeon: Jolaine Artist, MD;  Location: [place] [facility] [facility];  Service: Cardiovascular;  Laterality: N/A;   TONSILLECTOMY      SOCIAL HISTORY: Social History   Socioeconomic History   Marital status: Married    Spouse name: Not on file   Number of children: 5   Years of education: 12   Highest education level: Not on file  Occupational History   Occupation: Retired  Tobacco Use   Smoking status: Former    Types: Cigarettes  Quit date: 02/25/2000    Years since quitting: 21.7   Smokeless tobacco: Former  Scientific laboratory technician Use: Never used  Substance and Sexual Activity   Alcohol use: No   Drug use: No   Sexual activity: Not on file  Other Topics Concern   Not on file  Social History Narrative   Lives at home with his wife.   Right-handed.   3-4 cups caffeine per day.   Social Determinants of Health   Financial Resource  Strain: Low Risk  (08/04/2019)   Overall Financial Resource Strain (CARDIA)    Difficulty of Paying Living Expenses: Not hard at all  Food Insecurity: No Food Insecurity (08/04/2019)   Hunger Vital Sign    Worried About Running Out of Food in the Last Year: Never true    Ran Out of Food in the Last Year: Never true  Transportation Needs: No Transportation Needs (08/04/2019)   PRAPARE - Hydrologist (Medical): No    Lack of Transportation (Non-Medical): No  Physical Activity: Insufficiently Active (08/04/2019)   Exercise Vital Sign    Days of Exercise per Week: 2 days    Minutes of Exercise per Session: 20 min  Stress: No Stress Concern Present (08/04/2019)   [place]    Feeling of Stress : Not at all  Social Connections: Moderately Integrated (08/04/2019)   Social Connection and Isolation Panel [NHANES]    Frequency of Communication with Friends and Family: Twice a week    Frequency of Social Gatherings with Friends and Family: Twice a week    Attends Religious Services: 1 to 4 times per year    Active Member of [employer] or Organizations: No    Attends Archivist Meetings: Never    Marital Status: Married  Human resources officer Violence: Not At Risk (08/04/2019)   Humiliation, Afraid, Rape, and Kick questionnaire    Fear of Current or Ex-Partner: No    Emotionally Abused: No    Physically Abused: No    Sexually Abused: No    FAMILY HISTORY: Family History  Problem Relation Age of Onset   Coronary artery disease Mother    Diabetes Mother    Heart disease Mother        Before age 36 and  CHF   Hypertension Mother    Other Mother        varicose veins   Heart attack Mother    Heart disease Sister    Hypertension Sister    Heart attack Sister    Heart disease Brother    Hyperlipidemia Brother    Hypertension Brother    Other Brother        varicose veins   Heart attack Brother     Heart disease Son 76       Heart Disease before age 38- Open hear surgery   Deep vein thrombosis Son    Heart attack Son    Coronary artery disease Other    Diabetes Daughter    Hypertension Daughter    Other Daughter        varicose veins    ALLERGIES:  is allergic to penicillins, other, statins, penicillin g sodium, spironolactone, and statins support [acid blockers support].  MEDICATIONS:  Current Outpatient Medications  Medication Sig Dispense Refill   acetaminophen (TYLENOL) 325 MG tablet Take 2 tablets (650 mg total) by mouth every 6 (six) hours as needed for mild pain (or  Fever >/= 101). 30 tablet 0   albuterol (PROAIR HFA) 108 (90 Base) MCG/ACT inhaler Inhale 2 puffs into the lungs every 6 (six) hours as needed for wheezing or shortness of breath.     aspirin EC 81 MG EC tablet Take 1 tablet (81 mg total) by mouth daily. 30 tablet 0   carvedilol (COREG) 12.5 MG tablet Take 1 tablet (12.5 mg total) by mouth 2 (two) times daily with a meal. 180 tablet 3   Cholecalciferol (VITAMIN D3) 10 MCG (400 UNIT) CAPS Take 400 Units by mouth daily.     digoxin (LANOXIN) 0.125 MG tablet Take 1 tablet (125 mcg total) by mouth daily. 90 tablet 3   ezetimibe (ZETIA) 10 MG tablet Take 10 mg by mouth daily.     fenofibrate 160 MG tablet Take 160 mg by mouth daily.     furosemide (LASIX) 40 MG tablet TAKE 1 TABLET DAILY (MAINTENANCE DOSE IS NOW 40 MG DAILY) 90 tablet 3   GARLIC PO Take by mouth.     glipiZIDE (GLIPIZIDE XL) 10 MG 24 hr tablet Take 1 tablet (10 mg total) by mouth daily with breakfast. 90 tablet 3   ipratropium (ATROVENT) 0.02 % nebulizer solution Take 2.5 mLs (0.5 mg dose) by nebulization 2 (two) times daily.     linagliptin (TRADJENTA) 5 MG TABS tablet Take 1 tablet (5 mg total) by mouth daily. 90 tablet 3   Multiple Vitamin (MULTIVITAMIN) tablet Take 1 tablet by mouth daily.     Omega-3 Fatty Acids (FISH OIL) 1200 MG CAPS Take 1,000 mg by mouth daily.      ONETOUCH VERIO test  strip      pantoprazole (PROTONIX) 40 MG tablet Take 1 tablet (40 mg total) by mouth daily. 30 tablet 0   pravastatin (PRAVACHOL) 40 MG tablet Take 80 mg by mouth daily.      sacubitril-valsartan (ENTRESTO) 24-26 MG Take 1 tablet by mouth 2 (two) times daily. 180 tablet 3   UNABLE TO FIND Beet root     warfarin (COUMADIN) 7.5 MG tablet Take 1 tablet (7.5 mg total) by mouth daily.     No current facility-administered medications for this visit.    REVIEW OF SYSTEMS:   Constitutional: Denies fevers, chills or abnormal night sweats Eyes: Denies blurriness of vision, double vision or watery eyes Ears, nose, mouth, throat, and face: Denies mucositis or sore throat Respiratory: Denies cough, dyspnea or wheezes Cardiovascular: Denies palpitation, chest discomfort or lower extremity swelling Gastrointestinal:  Denies nausea, heartburn or change in bowel habits Skin: Denies abnormal skin rashes Lymphatics: Denies new lymphadenopathy or easy bruising Neurological:Denies numbness, tingling or new weaknesses Behavioral/Psych: Mood is stable, no new changes  All other systems were reviewed with the patient and are negative.  PHYSICAL EXAMINATION: ECOG PERFORMANCE STATUS: 1 - Symptomatic but completely ambulatory  Vitals:   12/06/21 0945  BP: 136/69  Pulse: (!) 56  Resp: 18  Temp: 98.1 F (36.7 C)  SpO2: 97%   Filed Weights   12/06/21 0945  Weight: 148 lb 4.8 oz (67.3 kg)    GENERAL:alert, no distress and comfortable SKIN: skin color, texture, turgor are normal, no rashes or significant lesions EYES: normal, conjunctiva are pink and non-injected, sclera clear OROPHARYNX:no exudate, no erythema and lips, buccal mucosa, and tongue normal  NECK: supple, thyroid normal size, non-tender, without nodularity LYMPH:  no palpable lymphadenopathy in the cervical, axillary or inguinal LUNGS: clear to auscultation and percussion with normal breathing effort HEART: regular rate &  rhythm and no  murmurs and no lower extremity edema ABDOMEN:abdomen soft, non-tender and normal bowel sounds Musculoskeletal:no cyanosis of digits and no clubbing  PSYCH: alert & oriented x 3 with fluent speech NEURO: no focal motor/sensory deficits  LABORATORY DATA:  I have reviewed the data as listed No results found for this or any previous visit (from the past 2160 hour(s)).  RADIOGRAPHIC STUDIES: I have personally reviewed the radiological images as listed and agreed with the findings in the report. No results found.  ASSESSMENT:  1.  Abnormal SPEP: - Patient seen at the request of Dr. Theador Hawthorne. - SPEP (07/26/2021): Poorly defined band of restricted protein mobility is detected.  Immunofixation was negative. - 24-hour urine: Total protein 100 mg, urine immunofixation electrophoresis showed monoclonal free kappa light chain detected. - Recent CBC also showed elevated hemoglobin around 14.  Unfortunately I do not have differential.  2.  Social/family history: - Quit smoking in 2002. - Retired from working in Microbiologist.  No chemical exposure. - No family history of leukemias or other malignancies.    PLAN:  1.  Abnormal SPEP: - We will check serum free light chains and repeat SPEP and immunofixation, LDH and beta-2 microglobulin. - If there is abnormal protein, will consider doing skeletal survey and 24-hour urine for free light chains and UPEP.  2.  Leukocytosis: - WBC was elevated at 14.1 (10/30/2021), 13.5 (07/26/2021), 16.9 (04/30/2021), 11.4 (01/30/2021), 11.3 (08/15/2020): - I do not have differential.  I will repeat CBC today.  If there is lymphocytosis, will proceed with flow cytometry.  If it is neutrophilic leukocytosis, will consider checking for MPN's.  3.  CKD: - He has stage III CKD from diabetes, age associated decline in GFR.   All questions were answered. The patient knows to call the clinic with any problems, questions or concerns.       Derek Jack, MD 12/06/21 9:48 AM

## 2021-12-06 NOTE — Patient Instructions (Addendum)
[facility]  Discharge Instructions  You were seen and examined today by Dr. Delton Coombes. Dr. Delton Coombes is a hematologist, meaning that he specializes in blood abnormalities. Dr. Delton Coombes discussed your past medical history, family history of cancers/blood conditions and the events that led to you being here today.  You were referred to Dr. Delton Coombes due to abnormal blood work. You were previously seen by Dr. Delton Coombes for an abnormal protein presence in the blood, that is the reason you were referred back in addition to Leukocytosis (elevated white blood cells). Dr. Delton Coombes has recommended additional labs today for further evaluation.  Follow-up as scheduled.  Thank you for choosing [facility] to provide your oncology and hematology care.   To afford each patient quality time with our provider, please arrive at least 15 minutes before your scheduled appointment time. You may need to reschedule your appointment if you arrive late (10 or more minutes). Arriving late affects you and other patients whose appointments are after yours.  Also, if you miss three or more appointments without notifying the office, you may be dismissed from the clinic at the provider's discretion.    Again, thank you for choosing [facility].  Our hope is that these requests will decrease the amount of time that you wait before being seen by our physicians.   If you have a lab appointment with the [facility] please come in thru the Main Entrance and check in at the main information desk.           _____________________________________________________________  Should you have questions after your visit to [facility], please contact our office at [phone] and follow the prompts.  Our office hours are 8:00 a.m. to 4:30 p.m. Monday - Thursday and 8:00 a.m. to 2:30 p.m. Friday.  Please note that voicemails left after 4:00 p.m. may  not be returned until the following business day.  We are closed weekends and all major holidays.  You do have access to a nurse 24-7, just call the main number to the clinic [phone] and do not press any options, hold on the line and a nurse will answer the phone.    For prescription refill requests, have your pharmacy contact our office and allow 72 hours.    Masks are optional in the cancer centers. If you would like for your care team to wear a mask while they are taking care of you, please let them know. You may have one support person who is at least 82 years old accompany you for your appointments.

## 2021-12-07 LAB — BETA 2 MICROGLOBULIN, SERUM: Beta-2 Microglobulin: 3.3 mg/L — ABNORMAL HIGH (ref 0.6–2.4)

## 2021-12-09 LAB — KAPPA/LAMBDA LIGHT CHAINS
Kappa free light chain: 61 mg/L — ABNORMAL HIGH (ref 3.3–19.4)
Kappa, lambda light chain ratio: 4.66 — ABNORMAL HIGH (ref 0.26–1.65)
Lambda free light chains: 13.1 mg/L (ref 5.7–26.3)

## 2021-12-09 LAB — SURGICAL PATHOLOGY

## 2021-12-10 LAB — PROTEIN ELECTROPHORESIS, SERUM
A/G Ratio: 1.5 (ref 0.7–1.7)
Albumin ELP: 3.7 g/dL (ref 2.9–4.4)
Alpha-1-Globulin: 0.2 g/dL (ref 0.0–0.4)
Alpha-2-Globulin: 0.8 g/dL (ref 0.4–1.0)
Beta Globulin: 0.9 g/dL (ref 0.7–1.3)
Gamma Globulin: 0.4 g/dL (ref 0.4–1.8)
Globulin, Total: 2.4 g/dL (ref 2.2–3.9)
Total Protein ELP: 6.1 g/dL (ref 6.0–8.5)

## 2021-12-13 LAB — IMMUNOFIXATION ELECTROPHORESIS
IgA: 51 mg/dL — ABNORMAL LOW (ref 61–437)
IgG (Immunoglobin G), Serum: 466 mg/dL — ABNORMAL LOW (ref 603–1613)
IgM (Immunoglobulin M), Srm: 20 mg/dL (ref 15–143)
Total Protein ELP: 6.3 g/dL (ref 6.0–8.5)

## 2021-12-16 LAB — FLOW CYTOMETRY

## 2021-12-28 NOTE — Progress Notes (Deleted)
NO SHOW

## 2021-12-30 ENCOUNTER — Inpatient Hospital Stay: Payer: Medicare Other | Attending: Hematology | Admitting: Physician Assistant

## 2022-03-05 ENCOUNTER — Ambulatory Visit: Payer: TRICARE For Life (TFL) | Admitting: Nurse Practitioner

## 2022-03-05 DIAGNOSIS — E785 Hyperlipidemia, unspecified: Secondary | ICD-10-CM

## 2022-03-05 DIAGNOSIS — I10 Essential (primary) hypertension: Secondary | ICD-10-CM

## 2022-03-05 DIAGNOSIS — N1831 Chronic kidney disease, stage 3a: Secondary | ICD-10-CM

## 2022-03-07 IMAGING — US US RENAL
1 series · 14 of 25 positions shown · non-contrast
Comparison: Renal ultrasound dated 01/06/2019.

CLINICAL DATA: 79-year-old male with chronic kidney disease.

EXAM:
RENAL / URINARY TRACT ULTRASOUND COMPLETE

[Series 1: us renal · 14 of 42 slices shown]
[im 1/42]
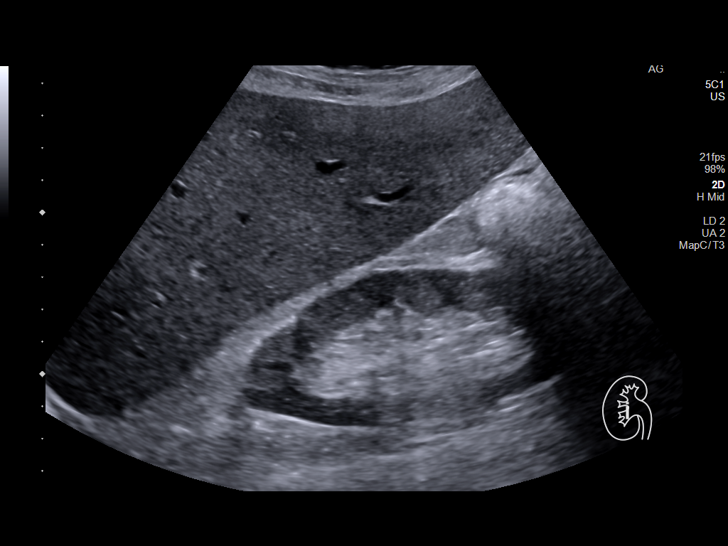
[im 4/42]
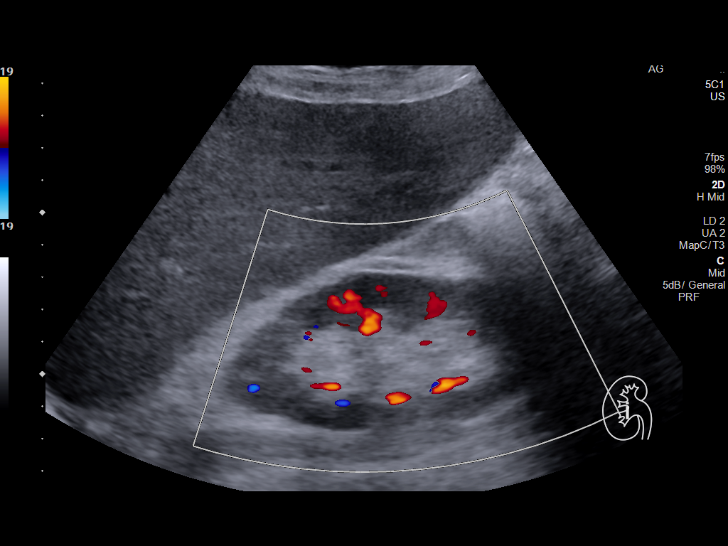
[im 7/42]
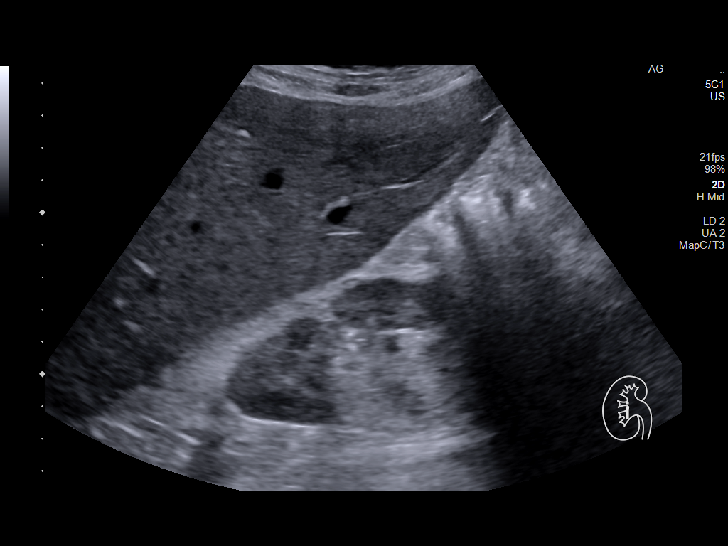
[im 11/42]
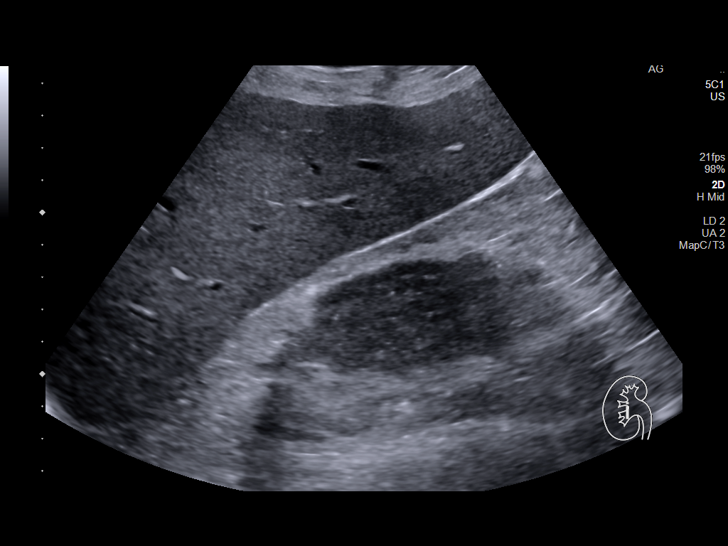
[im 14/42]
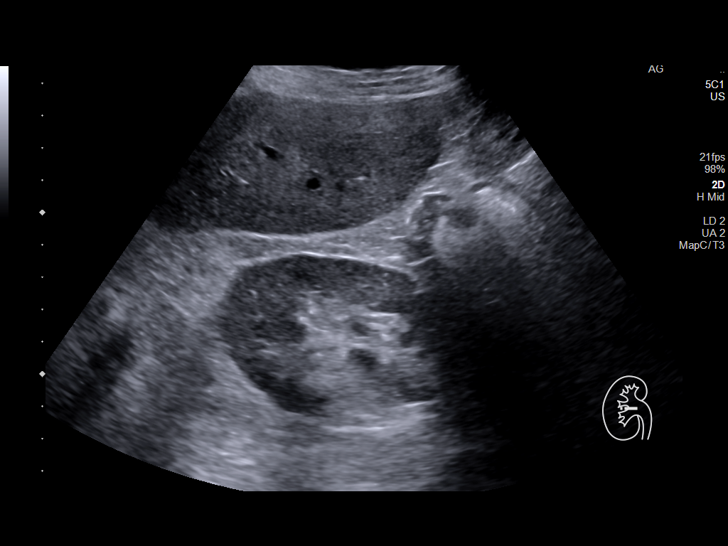
[im 16/42]
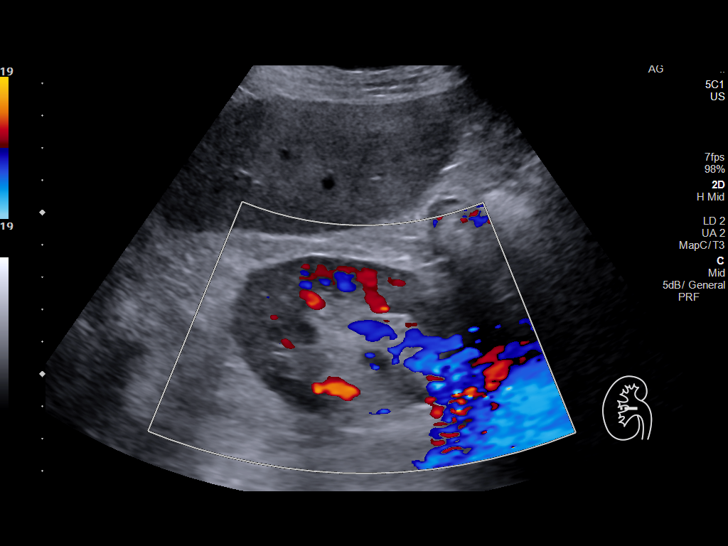
[im 19/42]
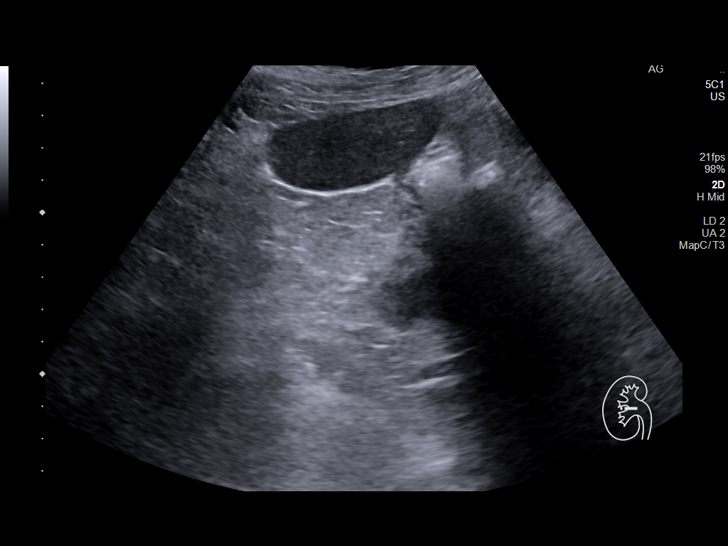
[im 23/42]
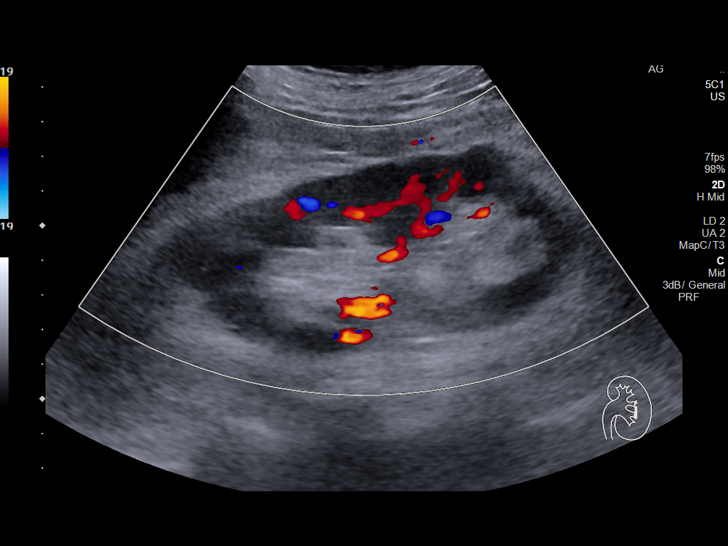
[im 26/42]
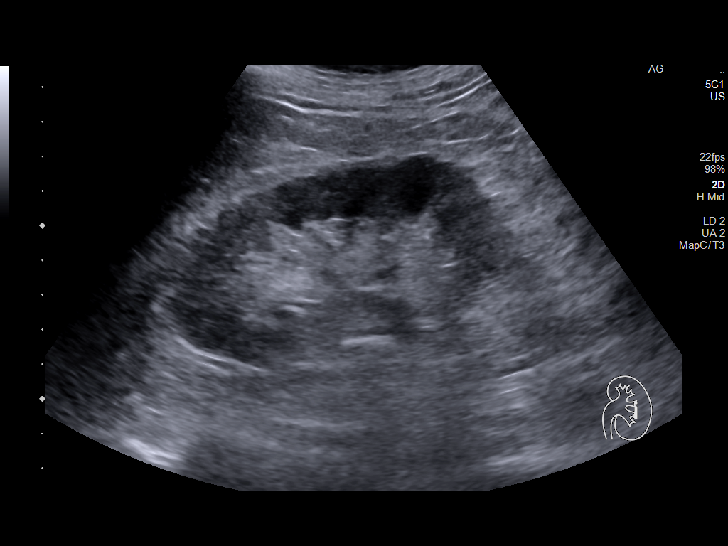
[im 28/42]
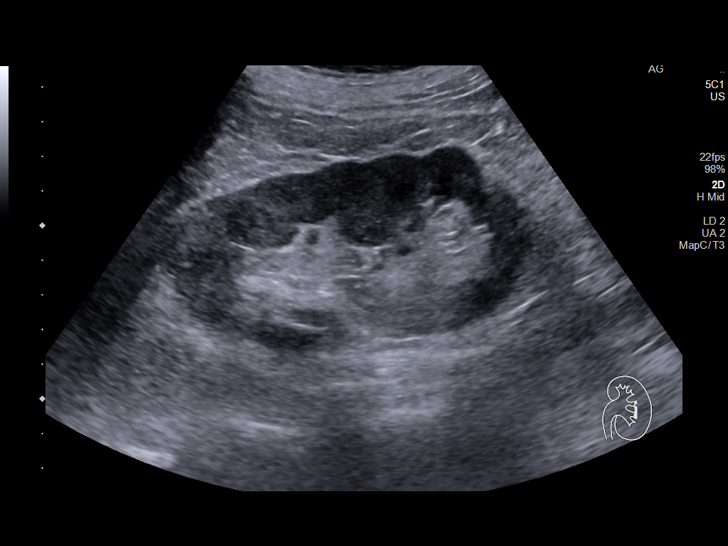
[im 31/42]
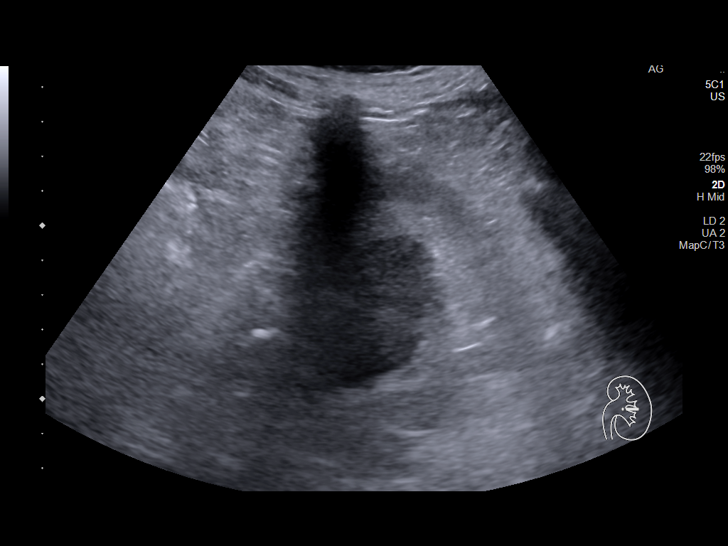
[im 35/42]
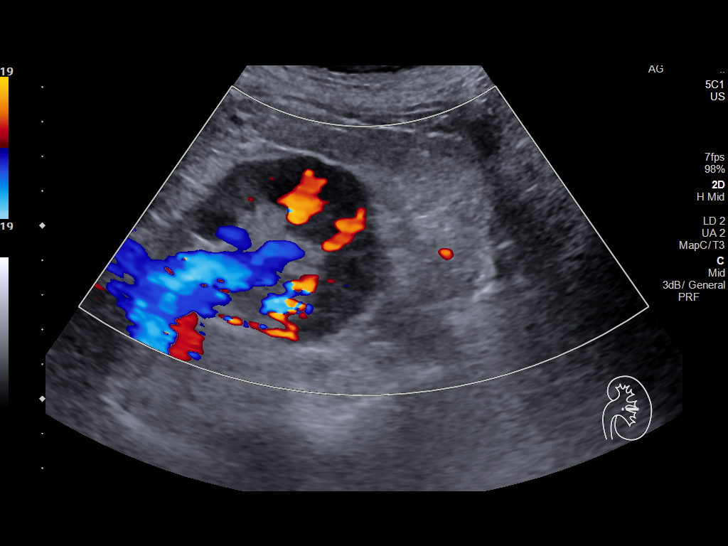
[im 38/42]
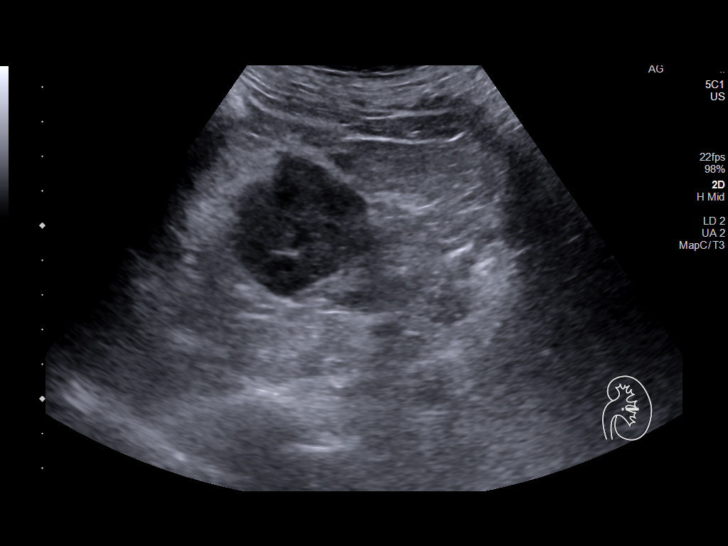
[im 42/42]
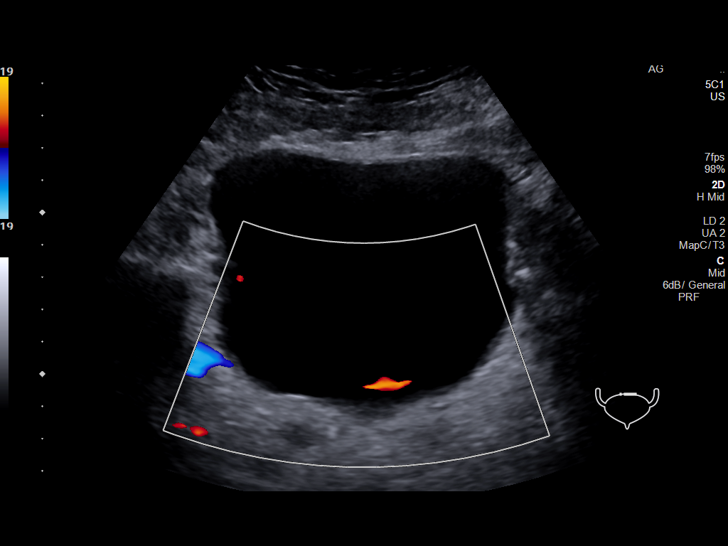

[14 of 25 positions shown; findings below may reference images not displayed]

FINDINGS: Right Kidney:

Renal measurements: 11.1 x 5.0 x 6.0 cm = volume: 174 mL. Normal
echogenicity. No hydronephrosis or shadowing stone.

Left Kidney:

Renal measurements: 11.3 x 5.3 x 4.8 cm = volume: 151 mL. Normal
echogenicity. No hydronephrosis or shadowing stone.

Bladder:

Appears normal for degree of bladder distention. Bilateral ureteral
jets noted.

Other:

None.
IMPRESSION: Unremarkable renal ultrasound.

## 2022-04-20 NOTE — Progress Notes (Unsigned)
Cardiology Office Note   Date:  04/23/2022   ID:  [name], DOB [birth year], MRN [id number]  PCP:  Adaline Sill, NP  Cardiologist:   Minus Breeding, MD   Chief Complaint  Patient presents with   Coronary Artery Disease      History of Present Illness: Oscar [name] is a 82 y.o. male who presents for follow up of cardiomyopathy.  He was in the ED in May and an EF of 30% which was lower than previous.  A cath was performed by Dr. Haroldine Laws on 07/29/2018 and showed 100% Proximal-RCA stenosis, 99% Proximal-LAD, 75% Ost LM, 70% LCx, 40% PDA, and 30% Ost LCx. Cardiac MRI was performed to assess viability and showed a mixed picture with the mid anteroseptal/inferoseptal and the apical inferior and true apex wall segments unlikely to improve with revascularization however the other affected wall segments appeared viable. CT surgery was consulted but the patient declined CABG. Options regarding high risk PCI with atherectomy and Impella support were reviewed but the patient declined this as well and wished to pursue medical management. Also refused CEA during admission .  He presented back to the hospital in June with AMS.  He eventually did have CEA.  I did review his films with Dr. Burt Knack and we agreed that PCI would be high risk and difficult for procedural success.  He did have an echocardiogram which demonstrated that his EF was up a little bit from 25 to 30% and perhaps mildly increased to 35 to 40%.  He did have some moderately elevated pulmonary pressures.   I have not seen him in a couple of years.   He has been followed by [facility].  He has been doing well.   He still pushes a mower.  He is raising his 83 year old granddaughter.   The patient denies any new symptoms such as chest discomfort, neck or arm discomfort. There has been no new shortness of breath, PND or orthopnea. There have been no reported palpitations, presyncope or syncope.    Past Medical History:   Diagnosis Date   Anemia    Atrial fibrillation (HCC)    permanent   CAD (coronary artery disease)    a. cath on 07/29/2018 showed 100% Proximal-RCA stenosis, 99% Proximal-LAD, 75% Ost LM, 70% LCx, 40% PDA, and 30% Ost LCx. Declined CABG   Cardiomyopathy    EF 35% in the past--Improved to 55% echo 2/10. Moderately severe mitral regurgitation in the past, now improved,   Carotid artery stenosis    CHF (congestive heart failure) ([place]) 2002   COPD (chronic obstructive pulmonary disease) ([place])    Coronary artery disease    non obstructive   Diabetes mellitus    Dyslipidemia    History of tobacco abuse    Nephrolithiasis    Tremors of nervous system     Past Surgical History:  Procedure Laterality Date   CAROTID ENDARTERECTOMY Left 10-21-05   cea   CATARACT EXTRACTION W/PHACO Left 09/10/2018   Procedure: CATARACT EXTRACTION PHACO AND INTRAOCULAR LENS PLACEMENT LEFT EYE;  Surgeon: Baruch Goldmann, MD;  Location: [facility];  Service: Ophthalmology;  Laterality: Left;  left   CATARACT EXTRACTION W/PHACO Right 11/12/2018   Procedure: CATARACT EXTRACTION PHACO AND INTRAOCULAR LENS PLACEMENT RIGHT EYE;  Surgeon: Baruch Goldmann, MD;  Location: [facility];  Service: Ophthalmology;  Laterality: Right;  right   ENDARTERECTOMY Right 08/11/2018   Procedure: ENDARTERECTOMY CAROTID RIGHT;  Surgeon: [name], MD;  Location: [facility];  Service: Vascular;  Laterality: Right;   left carotid endarterectomy     PATCH ANGIOPLASTY Right 08/11/2018   Procedure: PATCH ANGIOPLAST USING HEMASHIELD PLATINUM FINESSE;  Surgeon: [name], MD;  Location: [facility];  Service: Vascular;  Laterality: Right;   RIGHT/LEFT HEART CATH AND CORONARY ANGIOGRAPHY N/A 07/29/2018   Procedure: RIGHT/LEFT HEART CATH AND CORONARY ANGIOGRAPHY;  Surgeon: Jolaine Artist, MD;  Location: [facility];  Service: Cardiovascular;  Laterality: N/A;   TONSILLECTOMY       Current Outpatient Medications  Medication Sig Dispense Refill    acetaminophen (TYLENOL) 325 MG tablet Take 2 tablets (650 mg total) by mouth every 6 (six) hours as needed for mild pain (or Fever >/= 101). 30 tablet 0   albuterol (PROAIR HFA) 108 (90 Base) MCG/ACT inhaler Inhale 2 puffs into the lungs every 6 (six) hours as needed for wheezing or shortness of breath.     aspirin EC 81 MG EC tablet Take 1 tablet (81 mg total) by mouth daily. 30 tablet 0   carvedilol (COREG) 12.5 MG tablet Take 1 tablet (12.5 mg total) by mouth 2 (two) times daily with a meal. 180 tablet 3   Cholecalciferol (VITAMIN D3) 10 MCG (400 UNIT) CAPS Take 400 Units by mouth daily.     digoxin (LANOXIN) 0.125 MG tablet Take 1 tablet (125 mcg total) by mouth daily. 90 tablet 3   ezetimibe (ZETIA) 10 MG tablet Take 10 mg by mouth daily.     fenofibrate 160 MG tablet Take 160 mg by mouth daily.     furosemide (LASIX) 40 MG tablet TAKE 1 TABLET DAILY (MAINTENANCE DOSE IS NOW 40 MG DAILY) 90 tablet 3   GARLIC PO Take by mouth.     glipiZIDE (GLIPIZIDE XL) 10 MG 24 hr tablet Take 1 tablet (10 mg total) by mouth daily with breakfast. 90 tablet 3   ipratropium (ATROVENT) 0.02 % nebulizer solution Take 2.5 mLs (0.5 mg dose) by nebulization 2 (two) times daily.     linagliptin (TRADJENTA) 5 MG TABS tablet Take 1 tablet (5 mg total) by mouth daily. 90 tablet 3   Multiple Vitamin (MULTIVITAMIN) tablet Take 1 tablet by mouth daily.     nitroGLYCERIN (NITROSTAT) 0.4 MG SL tablet Place 1 tablet (0.4 mg total) under the tongue every 5 (five) minutes as needed for chest pain. 25 tablet 4   Omega-3 Fatty Acids (FISH OIL) 1200 MG CAPS Take 1,000 mg by mouth daily.      ONETOUCH VERIO test strip      pantoprazole (PROTONIX) 40 MG tablet Take 1 tablet (40 mg total) by mouth daily. 30 tablet 0   pravastatin (PRAVACHOL) 40 MG tablet Take 80 mg by mouth daily.      sacubitril-valsartan (ENTRESTO) 24-26 MG Take 1 tablet by mouth 2 (two) times daily. 180 tablet 3   UNABLE TO FIND Beet root     warfarin  (COUMADIN) 7.5 MG tablet Take 1 tablet (7.5 mg total) by mouth daily.     No current facility-administered medications for this visit.    Allergies:   Penicillins, Other, Statins, Penicillin g sodium, Spironolactone, and Statins support [acid blockers support]    ROS:  Please see the history of present illness.   Otherwise, review of systems are positive for none.   All other systems are reviewed and negative.    PHYSICAL EXAM: VS:  BP 130/64   Pulse 60   Ht '5\' 6"'$  (1.676  m)   Wt 144 lb (65.3 kg)   BMI 23.24 kg/m  , BMI Body mass index is 23.24 kg/m.  GENERAL:  Well appearing NECK:  No jugular venous distention, waveform within normal limits, carotid upstroke brisk and symmetric, bilateral carotid bruits, no thyromegaly LUNGS:  Clear to auscultation bilaterally CHEST:  Unremarkable HEART:  PMI not displaced or sustained,S1 and S2 within normal limits, no S3, no clicks, no rubs, 3 out of 6 apical systolic murmur radiating aortic outflow tract, no diastolic murmurs, irregular ABD:  Flat, positive bowel sounds normal in frequency in pitch, no bruits, no rebound, no guarding, no midline pulsatile mass, no hepatomegaly, no splenomegaly EXT:  2 plus pulses upper and decreased DP/PT bilateral, no edema, no cyanosis no clubbing  EKG:  EKG  not ordered  Atrial fibrillation, rate 72, axis within normal limits, intervals within normal limit.  There is some inferolateral ST depression that was present on previous EKGs that was slightly more prominent.  11/05/2021  Recent Labs: 12/06/2021: ALT 25; BUN 39; Creatinine, Ser 1.37; Hemoglobin 13.7; Platelets 320; Potassium 4.2; Sodium 135    Lipid Panel    Component Value Date/Time   CHOL 121 06/11/2021 0000   TRIG 316 (A) 06/11/2021 0000   HDL 25 (A) 06/11/2021 0000   CHOLHDL 5.6 07/25/2018 0443   VLDL 23 07/25/2018 0443   LDLCALC 48 06/11/2021 0000      Wt Readings from Last 3 Encounters:  04/23/22 144 lb (65.3 kg)  12/06/21 148 lb 4.8  oz (67.3 kg)  11/05/21 149 lb 12.8 oz (67.9 kg)      Other studies Reviewed: Additional studies/ records that were reviewed today include:   Labs Review of the above records demonstrates: See elsewhere   ASSESSMENT AND PLAN:  CAD:    The patient has no new sypmtoms.  No further cardiovascular testing is indicated.  We will continue with aggressive risk reduction and meds as listed.  CHRONIC COMBINED SYSTOLIC AND DIASTOLIC HF:   His ejection fraction will slightly improve his euvolemic.  He has not tolerated med titration in the past with low blood pressures.  No change in therapy.  His symptoms are class I-II.  Of note his endocrinologist stopped Jardiance because of renal insufficiency.  Renal lactone was not used because of hyperkalemia.  ATRIAL FIB: He tolerates anticoagulation.  He has had reasonable rate control.  He does not really notice his fibrillation.  He is now wanting to change from warfarin.  No change in therapy.   HTN:   This is managed in the context of treating his cardiomyopathy.   HYPERLIPIDEMIA:    LDL was 48 with an HDL of 25.  No change in therapy.    DM: He said his A1c was 7.3 and is followed closely by his primary provider.   Current medicines are reviewed at length with the patient today.  The patient does not have concerns regarding medicines.  The following changes have been made: None  Labs/ tests ordered today include:  None  No orders of the defined types were placed in this encounter.    Disposition:   FU with me in 12 months.   Signed, Minus Breeding, MD  04/23/2022 12:41 PM    [facility]

## 2022-04-23 ENCOUNTER — Other Ambulatory Visit: Payer: Self-pay | Admitting: *Deleted

## 2022-04-23 ENCOUNTER — Ambulatory Visit (INDEPENDENT_AMBULATORY_CARE_PROVIDER_SITE_OTHER): Payer: Medicare Other | Admitting: Cardiology

## 2022-04-23 ENCOUNTER — Encounter: Payer: Self-pay | Admitting: Cardiology

## 2022-04-23 VITALS — BP 130/64 | HR 60 | Ht 66.0 in | Wt 144.0 lb

## 2022-04-23 DIAGNOSIS — I4821 Permanent atrial fibrillation: Secondary | ICD-10-CM

## 2022-04-23 DIAGNOSIS — E785 Hyperlipidemia, unspecified: Secondary | ICD-10-CM | POA: Diagnosis not present

## 2022-04-23 DIAGNOSIS — I2510 Atherosclerotic heart disease of native coronary artery without angina pectoris: Secondary | ICD-10-CM | POA: Diagnosis not present

## 2022-04-23 MED ORDER — NITROGLYCERIN 0.4 MG SL SUBL: 0.4000 mg | SUBLINGUAL_TABLET | SUBLINGUAL | 4 refills | Status: DC | PRN

## 2022-04-23 NOTE — Patient Instructions (Signed)
Medication Instructions:  The current medical regimen is effective;  continue present plan and medications.  *If you need a refill on your cardiac medications before your next appointment, please call your pharmacy*  Follow-Up: At [facility], you and your health needs are our priority.  As part of our continuing mission to provide you with exceptional heart care, we have created designated Provider Care Teams.  These Care Teams include your primary Cardiologist (physician) and Advanced Practice Providers (APPs -  Physician Assistants and Nurse Practitioners) who all work together to provide you with the care you need, when you need it.  We recommend signing up for the patient portal called "MyChart".  Sign up information is provided on this After Visit Summary.  MyChart is used to connect with patients for Virtual Visits (Telemedicine).  Patients are able to view lab/test results, encounter notes, upcoming appointments, etc.  Non-urgent messages can be sent to your provider as well.   To learn more about what you can do with MyChart, go to [email or web address].    Your next appointment:   1 year(s)  Provider:   Minus Breeding, MD

## 2022-05-26 ENCOUNTER — Ambulatory Visit (INDEPENDENT_AMBULATORY_CARE_PROVIDER_SITE_OTHER): Payer: Medicare Other | Admitting: Nurse Practitioner

## 2022-05-26 ENCOUNTER — Encounter: Payer: Self-pay | Admitting: Nurse Practitioner

## 2022-05-26 VITALS — BP 130/70 | HR 77 | Ht 66.0 in | Wt 148.6 lb

## 2022-05-26 DIAGNOSIS — N1831 Chronic kidney disease, stage 3a: Secondary | ICD-10-CM

## 2022-05-26 DIAGNOSIS — I10 Essential (primary) hypertension: Secondary | ICD-10-CM | POA: Diagnosis not present

## 2022-05-26 DIAGNOSIS — E1122 Type 2 diabetes mellitus with diabetic chronic kidney disease: Secondary | ICD-10-CM | POA: Diagnosis not present

## 2022-05-26 DIAGNOSIS — E785 Hyperlipidemia, unspecified: Secondary | ICD-10-CM

## 2022-05-26 MED ORDER — LINAGLIPTIN 5 MG PO TABS: 5.0000 mg | ORAL_TABLET | Freq: Every day | ORAL | 3 refills | Status: DC

## 2022-05-26 MED ORDER — GLIPIZIDE ER 10 MG PO TB24: 10.0000 mg | ORAL_TABLET | Freq: Every day | ORAL | 3 refills | Status: DC

## 2022-05-26 NOTE — Progress Notes (Signed)
Endocrinology Follow Up Note       05/26/2022, 3:15 PM   Subjective:    Patient ID: Oscar [name], male    DOB: February 05, 1941.  Oscar [name] is being seen in follow up after being seen in consultation for management of currently uncontrolled symptomatic diabetes requested by  Adaline Sill, NP.   Past Medical History:  Diagnosis Date   Anemia    Atrial fibrillation    permanent   CAD (coronary artery disease)    a. cath on 07/29/2018 showed 100% Proximal-RCA stenosis, 99% Proximal-LAD, 75% Ost LM, 70% LCx, 40% PDA, and 30% Ost LCx. Declined CABG   Cardiomyopathy    EF 35% in the past--Improved to 55% echo 2/10. Moderately severe mitral regurgitation in the past, now improved,   Carotid artery stenosis    CHF (congestive heart failure) 2002   COPD (chronic obstructive pulmonary disease)    Coronary artery disease    non obstructive   Diabetes mellitus    Dyslipidemia    History of tobacco abuse    Nephrolithiasis    Tremors of nervous system     Past Surgical History:  Procedure Laterality Date   CAROTID ENDARTERECTOMY Left 10-21-05   cea   CATARACT EXTRACTION W/PHACO Left 09/10/2018   Procedure: CATARACT EXTRACTION PHACO AND INTRAOCULAR LENS PLACEMENT LEFT EYE;  Surgeon: Baruch Goldmann, MD;  Location: [facility];  Service: Ophthalmology;  Laterality: Left;  left   CATARACT EXTRACTION W/PHACO Right 11/12/2018   Procedure: CATARACT EXTRACTION PHACO AND INTRAOCULAR LENS PLACEMENT RIGHT EYE;  Surgeon: Baruch Goldmann, MD;  Location: [facility];  Service: Ophthalmology;  Laterality: Right;  right   ENDARTERECTOMY Right 08/11/2018   Procedure: ENDARTERECTOMY CAROTID RIGHT;  Surgeon: [name], MD;  Location: [facility] OR;  Service: Vascular;  Laterality: Right;   left carotid endarterectomy     PATCH ANGIOPLASTY Right 08/11/2018   Procedure: PATCH ANGIOPLAST USING HEMASHIELD PLATINUM FINESSE;  Surgeon: [name], MD;   Location: [facility];  Service: Vascular;  Laterality: Right;   RIGHT/LEFT HEART CATH AND CORONARY ANGIOGRAPHY N/A 07/29/2018   Procedure: RIGHT/LEFT HEART CATH AND CORONARY ANGIOGRAPHY;  Surgeon: Jolaine Artist, MD;  Location: [facility];  Service: Cardiovascular;  Laterality: N/A;   TONSILLECTOMY      Social History   Socioeconomic History   Marital status: Married    Spouse name: Not on file   Number of children: 5   Years of education: 12   Highest education level: Not on file  Occupational History   Occupation: Retired  Tobacco Use   Smoking status: Former    Types: Cigarettes    Quit date: 02/25/2000    Years since quitting: 22.2   Smokeless tobacco: Former  Scientific laboratory technician Use: Never used  Substance and Sexual Activity   Alcohol use: No   Drug use: No   Sexual activity: Not on file  Other Topics Concern   Not on file  Social History Narrative   Lives at home with his wife.   Right-handed.   3-4 cups caffeine per day.   Social Determinants of Health   Financial Resource Strain: Low Risk  (  08/04/2019)   Overall Financial Resource Strain (CARDIA)    Difficulty of Paying Living Expenses: Not hard at all  Food Insecurity: No Food Insecurity (08/04/2019)   Hunger Vital Sign    Worried About Running Out of Food in the Last Year: Never true    Ran Out of Food in the Last Year: Never true  Transportation Needs: No Transportation Needs (08/04/2019)   PRAPARE - Hydrologist (Medical): No    Lack of Transportation (Non-Medical): No  Physical Activity: Insufficiently Active (08/04/2019)   Exercise Vital Sign    Days of Exercise per Week: 2 days    Minutes of Exercise per Session: 20 min  Stress: No Stress Concern Present (08/04/2019)   [place]    Feeling of Stress : Not at all  Social Connections: Moderately Integrated (08/04/2019)   Social Connection and Isolation  Panel [NHANES]    Frequency of Communication with Friends and Family: Twice a week    Frequency of Social Gatherings with Friends and Family: Twice a week    Attends Religious Services: 1 to 4 times per year    Active Member of [employer] or Organizations: No    Attends Music therapist: Never    Marital Status: Married    Family History  Problem Relation Age of Onset   Coronary artery disease Mother    Diabetes Mother    Heart disease Mother        Before age 17 and  CHF   Hypertension Mother    Other Mother        varicose veins   Heart attack Mother    Heart disease Sister    Hypertension Sister    Heart attack Sister    Heart disease Brother    Hyperlipidemia Brother    Hypertension Brother    Other Brother        varicose veins   Heart attack Brother    Heart disease Son 82       Heart Disease before age 45- Open hear surgery   Deep vein thrombosis Son    Heart attack Son    Coronary artery disease Other    Diabetes Daughter    Hypertension Daughter    Other Daughter        varicose veins    Outpatient Encounter Medications as of 05/26/2022  Medication Sig   acetaminophen (TYLENOL) 325 MG tablet Take 2 tablets (650 mg total) by mouth every 6 (six) hours as needed for mild pain (or Fever >/= 101).   albuterol (PROAIR HFA) 108 (90 Base) MCG/ACT inhaler Inhale 2 puffs into the lungs every 6 (six) hours as needed for wheezing or shortness of breath.   aspirin EC 81 MG EC tablet Take 1 tablet (81 mg total) by mouth daily.   carvedilol (COREG) 12.5 MG tablet Take 1 tablet (12.5 mg total) by mouth 2 (two) times daily with a meal.   Cholecalciferol (VITAMIN D3) 10 MCG (400 UNIT) CAPS Take 400 Units by mouth daily.   digoxin (LANOXIN) 0.125 MG tablet Take 1 tablet (125 mcg total) by mouth daily.   ezetimibe (ZETIA) 10 MG tablet Take 10 mg by mouth daily.   fenofibrate 160 MG tablet Take 160 mg by mouth daily.   furosemide (LASIX) 40 MG tablet TAKE 1 TABLET DAILY  (MAINTENANCE DOSE IS NOW 40 MG DAILY)   GARLIC PO Take by mouth.   ipratropium (ATROVENT)  0.02 % nebulizer solution Take 2.5 mLs (0.5 mg dose) by nebulization 2 (two) times daily.   Multiple Vitamin (MULTIVITAMIN) tablet Take 1 tablet by mouth daily.   nitroGLYCERIN (NITROSTAT) 0.4 MG SL tablet Place 1 tablet (0.4 mg total) under the tongue every 5 (five) minutes as needed for chest pain.   Omega-3 Fatty Acids (FISH OIL) 1200 MG CAPS Take 1,000 mg by mouth daily.    ONETOUCH VERIO test strip    OVER THE COUNTER MEDICATION Over the Counter Beet Chews - patient takes 1 daily in the morning   pantoprazole (PROTONIX) 40 MG tablet Take 1 tablet (40 mg total) by mouth daily.   pravastatin (PRAVACHOL) 40 MG tablet Take 80 mg by mouth daily.    sacubitril-valsartan (ENTRESTO) 24-26 MG Take 1 tablet by mouth 2 (two) times daily.   UNABLE TO FIND Beet root   warfarin (COUMADIN) 7.5 MG tablet Take 1 tablet (7.5 mg total) by mouth daily.   [DISCONTINUED] glipiZIDE (GLIPIZIDE XL) 10 MG 24 hr tablet Take 1 tablet (10 mg total) by mouth daily with breakfast.   [DISCONTINUED] linagliptin (TRADJENTA) 5 MG TABS tablet Take 1 tablet (5 mg total) by mouth daily.   glipiZIDE (GLIPIZIDE XL) 10 MG 24 hr tablet Take 1 tablet (10 mg total) by mouth daily with breakfast.   linagliptin (TRADJENTA) 5 MG TABS tablet Take 1 tablet (5 mg total) by mouth daily.   No facility-administered encounter medications on file as of 05/26/2022.    ALLERGIES: Allergies  Allergen Reactions   Penicillins Hives, Other (See Comments) and Rash    Blisters on hands Blisters on hands  Blisters on hands Blisters on hands Blisters on hands Blisters on hands   Other Other (See Comments)    Joint and muscle pain and low energy. Joint and muscle pain and low energy.   Statins Other (See Comments)    Joint and muscle pain and low energy.   Penicillin G Sodium     Other reaction(s): hives   Spironolactone      Hyperkalemia Hyperkalemia Hyperkalemia   Statins Support [Acid Blockers Support]     Other reaction(s): weakness    VACCINATION STATUS:  There is no immunization history on file for this patient.  Diabetes He presents for his follow-up diabetic visit. He has type 2 diabetes mellitus. Onset time: Diagnosed at approx age of 4. His disease course has been stable. There are no hypoglycemic associated symptoms. Pertinent negatives for diabetes include no blurred vision, no fatigue and no foot paresthesias. There are no hypoglycemic complications. Symptoms are stable. Diabetic complications include a CVA, heart disease and nephropathy. Risk factors for coronary artery disease include diabetes mellitus, dyslipidemia, hypertension, male sex and sedentary lifestyle. Current diabetic treatment includes oral agent (dual therapy). He is compliant with treatment most of the time. His weight is fluctuating minimally. He is following a generally healthy diet. When asked about meal planning, he reported none. He has not had a previous visit with a dietitian. He rarely participates in exercise. His home blood glucose trend is fluctuating minimally. His breakfast blood glucose range is generally 90-110 mg/dl. His bedtime blood glucose range is generally 140-180 mg/dl. (He presents today with his meter and logs showing at goal glycemic profile overall.  His previsit A1c on 04/03/22 was 8.5%, increasing from last visit of 7.3%.  He notes he has been working on his diet.  He denies any hypoglycemia, lowest reading was 81.  He does still eat snacks at night  sometimes.) An ACE inhibitor/angiotensin II receptor blocker is contraindicated. He does not see a podiatrist.Eye exam is current.  Hypertension This is a chronic problem. The current episode started more than 1 year ago. The problem has been resolved since onset. The problem is controlled. Pertinent negatives include no blurred vision. There are no associated agents to  hypertension. Risk factors for coronary artery disease include diabetes mellitus, dyslipidemia, male gender and sedentary lifestyle. Past treatments include beta blockers and diuretics. The current treatment provides moderate improvement. There are no compliance problems.  Hypertensive end-organ damage includes kidney disease, CAD/MI, CVA and heart failure. Identifiable causes of hypertension include chronic renal disease and renovascular disease.  Hyperlipidemia This is a chronic problem. The current episode started more than 1 year ago. The problem is controlled. Recent lipid tests were reviewed and are normal. Exacerbating diseases include chronic renal disease, diabetes and nephrotic syndrome. Factors aggravating his hyperlipidemia include beta blockers and fatty foods. Current antihyperlipidemic treatment includes statins. There are no compliance problems.  Risk factors for coronary artery disease include diabetes mellitus, dyslipidemia, hypertension, male sex and a sedentary lifestyle.    Review of systems  Constitutional: + Minimally fluctuating body weight,  current Body mass index is 23.98 kg/m. , no fatigue, no subjective hyperthermia, no subjective hypothermia Eyes: no blurry vision, no xerophthalmia ENT: no sore throat, no nodules palpated in throat, no dysphagia/odynophagia, no hoarseness Cardiovascular: no chest pain, no shortness of breath, no palpitations, no leg swelling Respiratory: no cough, no shortness of breath Gastrointestinal: no nausea/vomiting/diarrhea Musculoskeletal: no muscle/joint aches Skin: no rashes, no hyperemia Neurological: no tremors, no numbness, no tingling, no dizziness Psychiatric: no depression, no anxiety  Objective:     BP 130/70 (BP Location: Left Arm, Patient Position: Sitting, Cuff Size: Normal) Comment: Retake manuel cuff  Pulse 77   Ht 5\' 6"  (1.676 m)   Wt 148 lb 9.6 oz (67.4 kg)   BMI 23.98 kg/m   Wt Readings from Last 3 Encounters:   05/26/22 148 lb 9.6 oz (67.4 kg)  04/23/22 144 lb (65.3 kg)  12/06/21 148 lb 4.8 oz (67.3 kg)    BP Readings from Last 3 Encounters:  05/26/22 130/70  04/23/22 130/64  12/06/21 136/69     Physical Exam- Limited  Constitutional:  Body mass index is 23.98 kg/m. , not in acute distress, normal state of mind Eyes:  EOMI, no exophthalmos Musculoskeletal: no gross deformities, strength intact in all four extremities, no gross restriction of joint movements Skin:  no rashes, no hyperemia Neurological: no tremor with outstretched hands   Diabetic Foot Exam - Simple   Simple Foot Form Diabetic Foot exam was performed with the following findings: Yes 05/26/2022  3:12 PM  Visual Inspection See comments: Yes Sensation Testing Intact to touch and monofilament testing bilaterally: Yes Pulse Check Posterior Tibialis and Dorsalis pulse intact bilaterally: Yes Comments Mild calluses bilaterally; small callused area to posterior distal end of great toe     CMP ( most recent) CMP     Component Value Date/Time   NA 135 12/06/2021 1031   NA 135 (A) 06/11/2021 0000   K 4.2 12/06/2021 1031   CL 100 12/06/2021 1031   CO2 25 12/06/2021 1031   GLUCOSE 243 (H) 12/06/2021 1031   BUN 39 (H) 12/06/2021 1031   BUN 33 (A) 06/11/2021 0000   CREATININE 1.37 (H) 12/06/2021 1031   CALCIUM 9.2 12/06/2021 1031   PROT 6.6 12/06/2021 1031   ALBUMIN 4.1 12/06/2021 1031  AST 27 12/06/2021 1031   ALT 25 12/06/2021 1031   ALKPHOS 36 (L) 12/06/2021 1031   BILITOT 0.5 12/06/2021 1031   GFRNONAA 52 (L) 12/06/2021 1031   GFRAA 57 (L) 08/04/2019 1449     Diabetic Labs (most recent): Lab Results  Component Value Date   HGBA1C 7.3 09/02/2021   HGBA1C 8.2 (A) 04/30/2021   HGBA1C 6.9 (A) 10/30/2020     Lipid Panel ( most recent) Lipid Panel     Component Value Date/Time   CHOL 121 06/11/2021 0000   TRIG 316 (A) 06/11/2021 0000   HDL 25 (A) 06/11/2021 0000   CHOLHDL 5.6 07/25/2018 0443   VLDL  23 07/25/2018 0443   LDLCALC 48 06/11/2021 0000      Lab Results  Component Value Date   TSH 0.997 06/16/2014           Assessment & Plan:   1) Type 2 Diabetes with CKD stage 3a  - Oscar [name] has currently uncontrolled symptomatic type 2 DM since 82 years of age.  He presents today with his meter and logs showing at goal glycemic profile overall.  His previsit A1c on 04/03/22 was 8.5%, increasing from last visit of 7.3%.  He notes he has been working on his diet.  He denies any hypoglycemia, lowest reading was 81.  He does still eat snacks at night sometimes.  -Recent labs reviewed.  - I had a long discussion with him about the progressive nature of diabetes and the pathology behind its complications. -his diabetes is complicated by TIA, MI, CHF, CAD, CKD and he remains at a high risk for more acute and chronic complications which include retinopathy, and neuropathy. These are all discussed in detail with him.  - Nutritional counseling repeated at each appointment due to patients tendency to fall back in to old habits.  - The patient admits there is a room for improvement in their diet and drink choices. -  Suggestion is made for the patient to avoid simple carbohydrates from their diet including Cakes, Sweet Desserts / Pastries, Ice Cream, Soda (diet and regular), Sweet Tea, Candies, Chips, Cookies, Sweet Pastries, Store Bought Juices, Alcohol in Excess of 1-2 drinks a day, Artificial Sweeteners, Coffee Creamer, and "Sugar-free" Products. This will help patient to have stable blood glucose profile and potentially avoid unintended weight gain.   - I encouraged the patient to switch to unprocessed or minimally processed complex starch and increased protein intake (animal or plant source), fruits, and vegetables.   - Patient is advised to stick to a routine mealtimes to eat 3 meals a day and avoid unnecessary snacks (to snack only to correct hypoglycemia).  - I have approached  him with the following individualized plan to manage  his diabetes and patient agrees:   Avoiding hypoglycemia is the #1 priority in his care given his age and comorbidities.  An appropriate A1c goal for him would be 7.0-7.5%.  -he is encouraged to continue monitoring blood glucose twice daily, before breakfast and before bed, and to call the clinic if he has readings less than 70 or greater than 300 for 3 tests in a row.  - Based on at goal glycemic profile, he is advised to continue Tradjenta 5 mg po daily and Glipizide 10 mg XL daily with breakfast.  He is aware that if he has a low glucose, he will need to reach out to me.  - he is not a candidate for Metformin, SGLT2 inhibitors due to  concurrent renal insufficiency.  -If he cannot gain control of diabetes with Tradjenta and Glipizide along with diet and exercise, may need to consider adding basal insulin, which is something he would like to avoid if possible.  - Specific targets for  A1c;  LDL, HDL,  and Triglycerides were discussed with the patient.  2) Blood Pressure /Hypertension:  his blood pressure is controlled to target.   he is advised to continue his current medications including Coreg 12.5 mg po twice daily, Lasix 40 mg po daily, and Entresto 24-26 mg po daily.  Will defer med changes to cardiology/nephrology.  3) Lipids/Hyperlipidemia:    His most recent lipid panel from 06/11/21 shows controlled LDL of 48 and elevated triglycerides of 2316.  He is advised to continue Pravastatin 40 mg po daily at bedtime, Fish Oil, Fenofibrate, and Zetia.  Side effects and precautions discussed with him.  Will recheck lipid panel prior to next visit.  4)  Weight/Diet:  his Body mass index is 23.98 kg/m.  - He is not a candidate for weight loss. Exercise, and detailed carbohydrates information provided  -  detailed on discharge instructions.  5) Chronic Care/Health Maintenance: -he is on Statin medications and is encouraged to initiate and  continue to follow up with Ophthalmology, Dentist, Podiatrist at least yearly or according to recommendations, and advised to stay away from smoking. I have recommended yearly flu vaccine and pneumonia vaccine at least every 5 years; moderate intensity exercise for up to 150 minutes weekly; and sleep for at least 7 hours a day.   - he is advised to maintain close follow up with Adaline Sill, NP for primary care needs, as well as his other providers for optimal and coordinated care.     I spent  32  minutes in the care of the patient today including review of labs from [facility], Lipids, Thyroid Function, Hematology (current and previous including abstractions from other facilities); face-to-face time discussing  his blood glucose readings/logs, discussing hypoglycemia and hyperglycemia episodes and symptoms, medications doses, his options of short and long term treatment based on the latest standards of care / guidelines;  discussion about incorporating lifestyle medicine;  and documenting the encounter. Risk reduction counseling performed per USPSTF guidelines to reduce obesity and cardiovascular risk factors.     Please refer to Patient Instructions for Blood Glucose Monitoring and Insulin/Medications Dosing Guide"  in media tab for additional information. Please  also refer to " Patient Self Inventory" in the Media  tab for reviewed elements of pertinent patient history.  Wynn Maudlin participated in the discussions, expressed understanding, and voiced agreement with the above plans.  All questions were answered to his satisfaction. he is encouraged to contact clinic should he have any questions or concerns prior to his return visit.   Follow up plan: - Return in about 4 months (around 09/25/2022) for Diabetes F/U with A1c in office, Previsit labs, Bring meter and logs.  Rayetta Pigg, [facility] [address] [place], [place] [address] Phone:  [phone] Fax: [phone]  05/26/2022, 3:15 PM

## 2022-07-25 LAB — LAB REPORT - SCANNED
A1c: 7.9
EGFR: 44

## 2022-08-06 ENCOUNTER — Encounter: Payer: Self-pay | Admitting: Internal Medicine

## 2022-09-19 ENCOUNTER — Other Ambulatory Visit: Payer: Self-pay | Admitting: *Deleted

## 2022-09-19 ENCOUNTER — Other Ambulatory Visit: Payer: Self-pay

## 2022-09-19 MED ORDER — LINAGLIPTIN 5 MG PO TABS: 5.0000 mg | ORAL_TABLET | Freq: Every day | ORAL | 0 refills | Status: DC

## 2022-09-19 MED ORDER — DIGOXIN 125 MCG PO TABS: 125.0000 ug | ORAL_TABLET | Freq: Every day | ORAL | 3 refills | Status: DC

## 2022-09-25 ENCOUNTER — Encounter: Payer: Self-pay | Admitting: Nurse Practitioner

## 2022-09-25 ENCOUNTER — Ambulatory Visit (INDEPENDENT_AMBULATORY_CARE_PROVIDER_SITE_OTHER): Payer: Medicare Other | Admitting: Nurse Practitioner

## 2022-09-25 VITALS — BP 101/59 | HR 76 | Ht 66.0 in | Wt 144.8 lb

## 2022-09-25 DIAGNOSIS — E785 Hyperlipidemia, unspecified: Secondary | ICD-10-CM | POA: Diagnosis not present

## 2022-09-25 DIAGNOSIS — Z7984 Long term (current) use of oral hypoglycemic drugs: Secondary | ICD-10-CM | POA: Diagnosis not present

## 2022-09-25 DIAGNOSIS — E1122 Type 2 diabetes mellitus with diabetic chronic kidney disease: Secondary | ICD-10-CM

## 2022-09-25 DIAGNOSIS — N1831 Chronic kidney disease, stage 3a: Secondary | ICD-10-CM

## 2022-09-25 DIAGNOSIS — I10 Essential (primary) hypertension: Secondary | ICD-10-CM

## 2022-09-25 MED ORDER — LINAGLIPTIN 5 MG PO TABS: 5.0000 mg | ORAL_TABLET | Freq: Every day | ORAL | 1 refills | Status: DC

## 2022-09-25 NOTE — Progress Notes (Signed)
Endocrinology Follow Up Note       09/25/2022, 2:20 PM   Subjective:    Patient ID: Oscar [name], male    DOB: March 29, 1940.  Oscar [name] is being seen in follow up after being seen in consultation for management of currently uncontrolled symptomatic diabetes requested by  Rebekah Chesterfield, NP.   Past Medical History:  Diagnosis Date   Anemia    Atrial fibrillation (HCC)    permanent   CAD (coronary artery disease)    a. cath on 07/29/2018 showed 100% Proximal-RCA stenosis, 99% Proximal-LAD, 75% Ost LM, 70% LCx, 40% PDA, and 30% Ost LCx. Declined CABG   Cardiomyopathy    EF 35% in the past--Improved to 55% echo 2/10. Moderately severe mitral regurgitation in the past, now improved,   Carotid artery stenosis    CHF (congestive heart failure) (HCC) 2002   COPD (chronic obstructive pulmonary disease) (HCC)    Coronary artery disease    non obstructive   Diabetes mellitus    Dyslipidemia    History of tobacco abuse    Nephrolithiasis    Tremors of nervous system     Past Surgical History:  Procedure Laterality Date   CAROTID ENDARTERECTOMY Left 10-21-05   cea   CATARACT EXTRACTION W/PHACO Left 09/10/2018   Procedure: CATARACT EXTRACTION PHACO AND INTRAOCULAR LENS PLACEMENT LEFT EYE;  Surgeon: Fabio Pierce, MD;  Location: [facility];  Service: Ophthalmology;  Laterality: Left;  left   CATARACT EXTRACTION W/PHACO Right 11/12/2018   Procedure: CATARACT EXTRACTION PHACO AND INTRAOCULAR LENS PLACEMENT RIGHT EYE;  Surgeon: Fabio Pierce, MD;  Location: [facility];  Service: Ophthalmology;  Laterality: Right;  right   ENDARTERECTOMY Right 08/11/2018   Procedure: ENDARTERECTOMY CAROTID RIGHT;  Surgeon: Larina Earthly, MD;  Location: [facility];  Service: Vascular;  Laterality: Right;   left carotid endarterectomy     PATCH ANGIOPLASTY Right 08/11/2018   Procedure: PATCH ANGIOPLAST USING HEMASHIELD PLATINUM FINESSE;  Surgeon:  Larina Earthly, MD;  Location: [facility] OR;  Service: Vascular;  Laterality: Right;   RIGHT/LEFT HEART CATH AND CORONARY ANGIOGRAPHY N/A 07/29/2018   Procedure: RIGHT/LEFT HEART CATH AND CORONARY ANGIOGRAPHY;  Surgeon: Dolores Patty, MD;  Location: [facility];  Service: Cardiovascular;  Laterality: N/A;   TONSILLECTOMY      Social History   Socioeconomic History   Marital status: Married    Spouse name: Not on file   Number of children: 5   Years of education: 12   Highest education level: Not on file  Occupational History   Occupation: Retired  Tobacco Use   Smoking status: Former    Current packs/day: 0.00    Types: Cigarettes    Quit date: 02/25/2000    Years since quitting: 22.5   Smokeless tobacco: Former  Building services engineer status: Never Used  Substance and Sexual Activity   Alcohol use: No   Drug use: No   Sexual activity: Not on file  Other Topics Concern   Not on file  Social History Narrative   Lives at home with his wife.   Right-handed.   3-4 cups caffeine per day.   Social Determinants  of Health   Financial Resource Strain: Low Risk  (08/04/2019)   Overall Financial Resource Strain (CARDIA)    Difficulty of Paying Living Expenses: Not hard at all  Food Insecurity: No Food Insecurity (08/04/2019)   Hunger Vital Sign    Worried About Running Out of Food in the Last Year: Never true    Ran Out of Food in the Last Year: Never true  Transportation Needs: No Transportation Needs (08/04/2019)   PRAPARE - Administrator, Civil Service (Medical): No    Lack of Transportation (Non-Medical): No  Physical Activity: Insufficiently Active (08/04/2019)   Exercise Vital Sign    Days of Exercise per Week: 2 days    Minutes of Exercise per Session: 20 min  Stress: No Stress Concern Present (08/04/2019)   [employer] of Occupational Health - Occupational Stress Questionnaire    Feeling of Stress : Not at all  Social Connections: Unknown (07/08/2021)    Received from [employer], [facility]   Social Network    Social Network: Not on file    Family History  Problem Relation Age of Onset   Coronary artery disease Mother    Diabetes Mother    Heart disease Mother        Before age 68 and  CHF   Hypertension Mother    Other Mother        varicose veins   Heart attack Mother    Heart disease Sister    Hypertension Sister    Heart attack Sister    Heart disease Brother    Hyperlipidemia Brother    Hypertension Brother    Other Brother        varicose veins   Heart attack Brother    Heart disease Son 58       Heart Disease before age 36- Open hear surgery   Deep vein thrombosis Son    Heart attack Son    Coronary artery disease Other    Diabetes Daughter    Hypertension Daughter    Other Daughter        varicose veins    Outpatient Encounter Medications as of 09/25/2022  Medication Sig   acetaminophen (TYLENOL) 325 MG tablet Take 2 tablets (650 mg total) by mouth every 6 (six) hours as needed for mild pain (or Fever >/= 101).   albuterol (PROAIR HFA) 108 (90 Base) MCG/ACT inhaler Inhale 2 puffs into the lungs every 6 (six) hours as needed for wheezing or shortness of breath.   aspirin EC 81 MG EC tablet Take 1 tablet (81 mg total) by mouth daily.   carvedilol (COREG) 12.5 MG tablet Take 1 tablet (12.5 mg total) by mouth 2 (two) times daily with a meal.   Cholecalciferol (VITAMIN D3) 10 MCG (400 UNIT) CAPS Take 400 Units by mouth daily.   digoxin (LANOXIN) 0.125 MG tablet Take 1 tablet (125 mcg total) by mouth daily.   ezetimibe (ZETIA) 10 MG tablet Take 10 mg by mouth daily.   fenofibrate 160 MG tablet Take 160 mg by mouth daily.   furosemide (LASIX) 40 MG tablet TAKE 1 TABLET DAILY (MAINTENANCE DOSE IS NOW 40 MG DAILY)   GARLIC PO Take by mouth.   glipiZIDE (GLIPIZIDE XL) 10 MG 24 hr tablet Take 1 tablet (10 mg total) by mouth daily with breakfast.   ipratropium (ATROVENT) 0.02 % nebulizer solution Take 2.5 mLs (0.5 mg  dose) by nebulization 2 (two) times daily.   Multiple Vitamin (MULTIVITAMIN) tablet  Take 1 tablet by mouth daily.   nitroGLYCERIN (NITROSTAT) 0.4 MG SL tablet Place 1 tablet (0.4 mg total) under the tongue every 5 (five) minutes as needed for chest pain.   Omega-3 Fatty Acids (FISH OIL) 1200 MG CAPS Take 1,000 mg by mouth daily.    ONETOUCH VERIO test strip    OVER THE COUNTER MEDICATION Over the Counter Beet Chews - patient takes 1 daily in the morning   pantoprazole (PROTONIX) 40 MG tablet Take 1 tablet (40 mg total) by mouth daily.   pravastatin (PRAVACHOL) 40 MG tablet Take 80 mg by mouth daily.    sacubitril-valsartan (ENTRESTO) 24-26 MG Take 1 tablet by mouth 2 (two) times daily.   UNABLE TO FIND Beet root   warfarin (COUMADIN) 7.5 MG tablet Take 1 tablet (7.5 mg total) by mouth daily.   [DISCONTINUED] linagliptin (TRADJENTA) 5 MG TABS tablet Take 5 mg by mouth daily.   [DISCONTINUED] linagliptin (TRADJENTA) 5 MG TABS tablet Take 1 tablet (5 mg total) by mouth daily.   linagliptin (TRADJENTA) 5 MG TABS tablet Take 1 tablet (5 mg total) by mouth daily.   No facility-administered encounter medications on file as of 09/25/2022.    ALLERGIES: Allergies  Allergen Reactions   Penicillins Hives, Other (See Comments) and Rash    Blisters on hands Blisters on hands  Blisters on hands Blisters on hands Blisters on hands Blisters on hands   Other Other (See Comments)    Joint and muscle pain and low energy. Joint and muscle pain and low energy.   Statins Other (See Comments)    Joint and muscle pain and low energy.   Penicillin G Sodium     Other reaction(s): hives   Spironolactone     Hyperkalemia Hyperkalemia Hyperkalemia   Statins Support [Acid Blockers Support]     Other reaction(s): weakness    VACCINATION STATUS:  There is no immunization history on file for this patient.  Diabetes He presents for his follow-up diabetic visit. He has type 2 diabetes mellitus. Onset  time: Diagnosed at approx age of 86. His disease course has been stable. There are no hypoglycemic associated symptoms. Pertinent negatives for diabetes include no blurred vision, no fatigue and no foot paresthesias. There are no hypoglycemic complications. Symptoms are stable. Diabetic complications include a CVA, heart disease and nephropathy. Risk factors for coronary artery disease include diabetes mellitus, dyslipidemia, hypertension, male sex and sedentary lifestyle. Current diabetic treatment includes oral agent (dual therapy). He is compliant with treatment most of the time. His weight is fluctuating minimally. He is following a generally healthy diet. When asked about meal planning, he reported none. He has not had a previous visit with a dietitian. He rarely participates in exercise. His home blood glucose trend is fluctuating minimally. His breakfast blood glucose range is generally 70-90 mg/dl. His bedtime blood glucose range is generally 140-180 mg/dl. (He presents today with his meter and logs showing at goal glycemic profile overall.  His previsit A1c on 07/25/22 was 7.9%, improving from last visit of 8.5%.  He notes he has been working on his diet.  He denies any hypoglycemia, lowest reading was 71.  He does still eat snacks at night sometimes.) An ACE inhibitor/angiotensin II receptor blocker is contraindicated. He does not see a podiatrist.Eye exam is current.  Hypertension This is a chronic problem. The current episode started more than 1 year ago. The problem has been resolved since onset. The problem is controlled. Pertinent negatives include no blurred  vision. There are no associated agents to hypertension. Risk factors for coronary artery disease include diabetes mellitus, dyslipidemia, male gender and sedentary lifestyle. Past treatments include beta blockers and diuretics. The current treatment provides moderate improvement. There are no compliance problems.  Hypertensive end-organ damage  includes kidney disease, CAD/MI, CVA and heart failure. Identifiable causes of hypertension include chronic renal disease and renovascular disease.  Hyperlipidemia This is a chronic problem. The current episode started more than 1 year ago. The problem is controlled. Recent lipid tests were reviewed and are normal. Exacerbating diseases include chronic renal disease, diabetes and nephrotic syndrome. Factors aggravating his hyperlipidemia include beta blockers and fatty foods. Current antihyperlipidemic treatment includes statins. There are no compliance problems.  Risk factors for coronary artery disease include diabetes mellitus, dyslipidemia, hypertension, male sex and a sedentary lifestyle.    Review of systems  Constitutional: + Minimally fluctuating body weight,  current Body mass index is 23.37 kg/m. , no fatigue, no subjective hyperthermia, no subjective hypothermia Eyes: no blurry vision, no xerophthalmia ENT: no sore throat, no nodules palpated in throat, no dysphagia/odynophagia, no hoarseness Cardiovascular: no chest pain, no shortness of breath, no palpitations, no leg swelling Respiratory: no cough, no shortness of breath Gastrointestinal: no nausea/vomiting/diarrhea Musculoskeletal: no muscle/joint aches Skin: no rashes, no hyperemia Neurological: no tremors, no numbness, no tingling, no dizziness Psychiatric: no depression, no anxiety  Objective:     BP (!) 101/59 (BP Location: Right Arm, Patient Position: Sitting, Cuff Size: Normal)   Pulse 76   Ht 5\' 6"  (1.676 m)   Wt 144 lb 12.8 oz (65.7 kg)   BMI 23.37 kg/m   Wt Readings from Last 3 Encounters:  09/25/22 144 lb 12.8 oz (65.7 kg)  05/26/22 148 lb 9.6 oz (67.4 kg)  04/23/22 144 lb (65.3 kg)    BP Readings from Last 3 Encounters:  09/25/22 (!) 101/59  05/26/22 130/70  04/23/22 130/64     Physical Exam- Limited  Constitutional:  Body mass index is 23.37 kg/m. , not in acute distress, normal state of  mind Eyes:  EOMI, no exophthalmos Musculoskeletal: no gross deformities, strength intact in all four extremities, no gross restriction of joint movements Skin:  no rashes, no hyperemia Neurological: no tremor with outstretched hands   Diabetic Foot Exam - Simple   No data filed     CMP ( most recent) CMP     Component Value Date/Time   NA 137 09/19/2022 0833   NA 135 (A) 06/11/2021 0000   K 5.1 09/19/2022 0833   CL 101 09/19/2022 0833   CO2 28 09/19/2022 0833   GLUCOSE 253 (H) 09/19/2022 0833   BUN 34 (H) 09/19/2022 0833   BUN 33 (A) 06/11/2021 0000   CREATININE 1.32 (H) 09/19/2022 0833   CALCIUM 9.7 09/19/2022 0833   PROT 5.9 (L) 09/19/2022 0833   ALBUMIN 4.1 12/06/2021 1031   AST 17 09/19/2022 0833   ALT 16 09/19/2022 0833   ALKPHOS 36 (L) 12/06/2021 1031   BILITOT 0.6 09/19/2022 0833   GFRNONAA 52 (L) 12/06/2021 1031   GFRAA 57 (L) 08/04/2019 1449     Diabetic Labs (most recent): Lab Results  Component Value Date   HGBA1C 7.3 09/02/2021   HGBA1C 8.2 (A) 04/30/2021   HGBA1C 6.9 (A) 10/30/2020     Lipid Panel ( most recent) Lipid Panel     Component Value Date/Time   CHOL 115 09/19/2022 0833   TRIG 239 (H) 09/19/2022 0833   HDL 28 (L)  09/19/2022 0833   CHOLHDL 4.1 09/19/2022 0833   VLDL 23 07/25/2018 0443   LDLCALC 57 09/19/2022 0833      Lab Results  Component Value Date   TSH 2.13 09/19/2022   TSH 0.997 06/16/2014   FREET4 1.5 09/19/2022           Assessment & Plan:   1) Type 2 Diabetes with CKD stage 3a  - Lester Kinsman Adamec has currently uncontrolled symptomatic type 2 DM since 82 years of age.  He presents today with his meter and logs showing at goal glycemic profile overall.  His previsit A1c on 07/25/22 was 7.9%, improving from last visit of 8.5%.  He notes he has been working on his diet.  He denies any hypoglycemia, lowest reading was 71.  He does still eat snacks at night sometimes.  -Recent labs reviewed.  - I had a long  discussion with him about the progressive nature of diabetes and the pathology behind its complications. -his diabetes is complicated by TIA, MI, CHF, CAD, CKD and he remains at a high risk for more acute and chronic complications which include retinopathy, and neuropathy. These are all discussed in detail with him.  - Nutritional counseling repeated at each appointment due to patients tendency to fall back in to old habits.  - The patient admits there is a room for improvement in their diet and drink choices. -  Suggestion is made for the patient to avoid simple carbohydrates from their diet including Cakes, Sweet Desserts / Pastries, Ice Cream, Soda (diet and regular), Sweet Tea, Candies, Chips, Cookies, Sweet Pastries, Store Bought Juices, Alcohol in Excess of 1-2 drinks a day, Artificial Sweeteners, Coffee Creamer, and "Sugar-free" Products. This will help patient to have stable blood glucose profile and potentially avoid unintended weight gain.   - I encouraged the patient to switch to unprocessed or minimally processed complex starch and increased protein intake (animal or plant source), fruits, and vegetables.   - Patient is advised to stick to a routine mealtimes to eat 3 meals a day and avoid unnecessary snacks (to snack only to correct hypoglycemia).  - I have approached him with the following individualized plan to manage  his diabetes and patient agrees:   Avoiding hypoglycemia is the #1 priority in his care given his age and comorbidities.  An appropriate A1c goal for him would be 7.0-7.5%.  -he is encouraged to continue monitoring blood glucose twice daily, before breakfast and before bed, and to call the clinic if he has readings less than 70 or greater than 300 for 3 tests in a row.  - Based on at goal glycemic profile, he is advised to continue Tradjenta 5 mg po daily and Glipizide 10 mg XL daily with breakfast.  He is aware that if he has a low glucose, he will need to reach out  to me.  - he is not a candidate for Metformin, SGLT2 inhibitors due to concurrent renal insufficiency.  -If he cannot gain control of diabetes with Tradjenta and Glipizide along with diet and exercise, may need to consider adding basal insulin, which is something he would like to avoid if possible.  - Specific targets for  A1c;  LDL, HDL,  and Triglycerides were discussed with the patient.  2) Blood Pressure /Hypertension:  his blood pressure is controlled to target.   he is advised to continue his current medications including Coreg 12.5 mg po twice daily, Lasix 40 mg po daily, and Entresto 24-26 mg  po daily.  Will defer med changes to cardiology/nephrology.  3) Lipids/Hyperlipidemia:    His most recent lipid panel from 06/11/21 shows controlled LDL of 48 and elevated triglycerides of 2316.  He is advised to continue Pravastatin 40 mg po daily at bedtime, Fish Oil, Fenofibrate, and Zetia.  Side effects and precautions discussed with him.  Will recheck lipid panel prior to next visit.  4)  Weight/Diet:  his Body mass index is 23.37 kg/m.  - He is not a candidate for weight loss. Exercise, and detailed carbohydrates information provided  -  detailed on discharge instructions.  5) Chronic Care/Health Maintenance: -he is on Statin medications and is encouraged to initiate and continue to follow up with Ophthalmology, Dentist, Podiatrist at least yearly or according to recommendations, and advised to stay away from smoking. I have recommended yearly flu vaccine and pneumonia vaccine at least every 5 years; moderate intensity exercise for up to 150 minutes weekly; and sleep for at least 7 hours a day.   - he is advised to maintain close follow up with Rebekah Chesterfield, NP for primary care needs, as well as his other providers for optimal and coordinated care.     I spent  24  minutes in the care of the patient today including review of labs from CMP, Lipids, Thyroid Function, Hematology  (current and previous including abstractions from other facilities); face-to-face time discussing  his blood glucose readings/logs, discussing hypoglycemia and hyperglycemia episodes and symptoms, medications doses, his options of short and long term treatment based on the latest standards of care / guidelines;  discussion about incorporating lifestyle medicine;  and documenting the encounter. Risk reduction counseling performed per USPSTF guidelines to reduce obesity and cardiovascular risk factors.     Please refer to Patient Instructions for Blood Glucose Monitoring and Insulin/Medications Dosing Guide"  in media tab for additional information. Please  also refer to " Patient Self Inventory" in the Media  tab for reviewed elements of pertinent patient history.  Heron Nay participated in the discussions, expressed understanding, and voiced agreement with the above plans.  All questions were answered to his satisfaction. he is encouraged to contact clinic should he have any questions or concerns prior to his return visit.   Follow up plan: - Return in about 4 months (around 01/25/2023) for Diabetes F/U with A1c in office, No previsit labs, Bring meter and logs.  Ronny Bacon, [facility] [address] [place], [place] [address] Phone: [phone] Fax: [phone]  09/25/2022, 2:20 PM

## 2022-10-06 ENCOUNTER — Other Ambulatory Visit: Payer: Self-pay | Admitting: Cardiology

## 2022-12-26 ENCOUNTER — Other Ambulatory Visit: Payer: Self-pay | Admitting: "Endocrinology

## 2023-01-27 ENCOUNTER — Encounter: Payer: Self-pay | Admitting: Nurse Practitioner

## 2023-01-27 ENCOUNTER — Ambulatory Visit (INDEPENDENT_AMBULATORY_CARE_PROVIDER_SITE_OTHER): Payer: Medicare Other | Admitting: Nurse Practitioner

## 2023-01-27 VITALS — BP 140/74 | HR 83 | Ht 66.0 in | Wt 147.0 lb

## 2023-01-27 DIAGNOSIS — I10 Essential (primary) hypertension: Secondary | ICD-10-CM

## 2023-01-27 DIAGNOSIS — E1122 Type 2 diabetes mellitus with diabetic chronic kidney disease: Secondary | ICD-10-CM | POA: Diagnosis not present

## 2023-01-27 DIAGNOSIS — Z7984 Long term (current) use of oral hypoglycemic drugs: Secondary | ICD-10-CM

## 2023-01-27 DIAGNOSIS — E785 Hyperlipidemia, unspecified: Secondary | ICD-10-CM | POA: Diagnosis not present

## 2023-01-27 DIAGNOSIS — N1831 Chronic kidney disease, stage 3a: Secondary | ICD-10-CM

## 2023-01-27 LAB — POCT GLYCOSYLATED HEMOGLOBIN (HGB A1C): Hemoglobin A1C: 8.1 % — AB (ref 4.0–5.6)

## 2023-01-27 MED ORDER — DAPAGLIFLOZIN PROPANEDIOL 5 MG PO TABS
5.0000 mg | ORAL_TABLET | Freq: Every day | ORAL | 3 refills | Status: DC
Start: 2023-01-27 — End: 2023-01-29

## 2023-01-27 NOTE — Progress Notes (Signed)
Endocrinology Follow Up Note       01/27/2023, 3:18 PM   Subjective:    Patient ID: Oscar [name], male    DOB: 1941-02-09.  Oscar [name] is being seen in follow up after being seen in consultation for management of currently uncontrolled symptomatic diabetes requested by  Rebekah Chesterfield, NP.   Past Medical History:  Diagnosis Date   Anemia    Atrial fibrillation (HCC)    permanent   CAD (coronary artery disease)    a. cath on 07/29/2018 showed 100% Proximal-RCA stenosis, 99% Proximal-LAD, 75% Ost LM, 70% LCx, 40% PDA, and 30% Ost LCx. Declined CABG   Cardiomyopathy    EF 35% in the past--Improved to 55% echo 2/10. Moderately severe mitral regurgitation in the past, now improved,   Carotid artery stenosis    CHF (congestive heart failure) (HCC) 2002   COPD (chronic obstructive pulmonary disease) (HCC)    Coronary artery disease    non obstructive   Diabetes mellitus    Dyslipidemia    History of tobacco abuse    Nephrolithiasis    Tremors of nervous system     Past Surgical History:  Procedure Laterality Date   CAROTID ENDARTERECTOMY Left 10-21-05   cea   CATARACT EXTRACTION W/PHACO Left 09/10/2018   Procedure: CATARACT EXTRACTION PHACO AND INTRAOCULAR LENS PLACEMENT LEFT EYE;  Surgeon: Fabio Pierce, MD;  Location: [facility];  Service: Ophthalmology;  Laterality: Left;  left   CATARACT EXTRACTION W/PHACO Right 11/12/2018   Procedure: CATARACT EXTRACTION PHACO AND INTRAOCULAR LENS PLACEMENT RIGHT EYE;  Surgeon: Fabio Pierce, MD;  Location: [facility];  Service: Ophthalmology;  Laterality: Right;  right   ENDARTERECTOMY Right 08/11/2018   Procedure: ENDARTERECTOMY CAROTID RIGHT;  Surgeon: Larina Earthly, MD;  Location: [facility] OR;  Service: Vascular;  Laterality: Right;   left carotid endarterectomy     PATCH ANGIOPLASTY Right 08/11/2018   Procedure: PATCH ANGIOPLAST USING HEMASHIELD PLATINUM FINESSE;  Surgeon:  Larina Earthly, MD;  Location: [facility] OR;  Service: Vascular;  Laterality: Right;   RIGHT/LEFT HEART CATH AND CORONARY ANGIOGRAPHY N/A 07/29/2018   Procedure: RIGHT/LEFT HEART CATH AND CORONARY ANGIOGRAPHY;  Surgeon: Dolores Patty, MD;  Location: [facility];  Service: Cardiovascular;  Laterality: N/A;   TONSILLECTOMY      Social History   Socioeconomic History   Marital status: Married    Spouse name: Not on file   Number of children: 5   Years of education: 12   Highest education level: Not on file  Occupational History   Occupation: Retired  Tobacco Use   Smoking status: Former    Current packs/day: 0.00    Types: Cigarettes    Quit date: 02/25/2000    Years since quitting: 22.9   Smokeless tobacco: Former  Building services engineer status: Never Used  Substance and Sexual Activity   Alcohol use: No   Drug use: No   Sexual activity: Not on file  Other Topics Concern   Not on file  Social History Narrative   Lives at home with his wife.   Right-handed.   3-4 cups caffeine per day.   Social Determinants  of Health   Financial Resource Strain: Low Risk  (08/04/2019)   Overall Financial Resource Strain (CARDIA)    Difficulty of Paying Living Expenses: Not hard at all  Food Insecurity: No Food Insecurity (08/04/2019)   Hunger Vital Sign    Worried About Running Out of Food in the Last Year: Never true    Ran Out of Food in the Last Year: Never true  Transportation Needs: No Transportation Needs (08/04/2019)   PRAPARE - Administrator, Civil Service (Medical): No    Lack of Transportation (Non-Medical): No  Physical Activity: Insufficiently Active (08/04/2019)   Exercise Vital Sign    Days of Exercise per Week: 2 days    Minutes of Exercise per Session: 20 min  Stress: No Stress Concern Present (08/04/2019)   [employer] of Occupational Health - Occupational Stress Questionnaire    Feeling of Stress : Not at all  Social Connections: Unknown (07/08/2021)    Received from [employer], [facility]   Social Network    Social Network: Not on file    Family History  Problem Relation Age of Onset   Coronary artery disease Mother    Diabetes Mother    Heart disease Mother        Before age 67 and  CHF   Hypertension Mother    Other Mother        varicose veins   Heart attack Mother    Heart disease Sister    Hypertension Sister    Heart attack Sister    Heart disease Brother    Hyperlipidemia Brother    Hypertension Brother    Other Brother        varicose veins   Heart attack Brother    Heart disease Son 59       Heart Disease before age 18- Open hear surgery   Deep vein thrombosis Son    Heart attack Son    Coronary artery disease Other    Diabetes Daughter    Hypertension Daughter    Other Daughter        varicose veins    Outpatient Encounter Medications as of 01/27/2023  Medication Sig   acetaminophen (TYLENOL) 325 MG tablet Take 2 tablets (650 mg total) by mouth every 6 (six) hours as needed for mild pain (or Fever >/= 101).   albuterol (PROAIR HFA) 108 (90 Base) MCG/ACT inhaler Inhale 2 puffs into the lungs every 6 (six) hours as needed for wheezing or shortness of breath.   aspirin EC 81 MG EC tablet Take 1 tablet (81 mg total) by mouth daily.   carvedilol (COREG) 12.5 MG tablet Take 1 tablet (12.5 mg total) by mouth 2 (two) times daily with a meal.   Cholecalciferol (VITAMIN D3) 10 MCG (400 UNIT) CAPS Take 400 Units by mouth daily.   dapagliflozin propanediol (FARXIGA) 5 MG TABS tablet Take 1 tablet (5 mg total) by mouth daily before breakfast.   digoxin (LANOXIN) 0.125 MG tablet Take 1 tablet (125 mcg total) by mouth daily.   ezetimibe (ZETIA) 10 MG tablet Take 10 mg by mouth daily.   fenofibrate 160 MG tablet Take 160 mg by mouth daily.   furosemide (LASIX) 40 MG tablet TAKE 1 TABLET DAILY (MAINTENANCE DOSE IS NOW 40 MG DAILY)   GARLIC PO Take by mouth.   glipiZIDE (GLIPIZIDE XL) 10 MG 24 hr tablet Take 1 tablet  (10 mg total) by mouth daily with breakfast.   ipratropium (ATROVENT) 0.02 %  nebulizer solution Take 2.5 mLs (0.5 mg dose) by nebulization 2 (two) times daily.   Multiple Vitamin (MULTIVITAMIN) tablet Take 1 tablet by mouth daily.   nitroGLYCERIN (NITROSTAT) 0.4 MG SL tablet Place 1 tablet (0.4 mg total) under the tongue every 5 (five) minutes as needed for chest pain.   Omega-3 Fatty Acids (FISH OIL) 1200 MG CAPS Take 1,000 mg by mouth daily.    ONETOUCH VERIO test strip    OVER THE COUNTER MEDICATION Over the Counter Beet Chews - patient takes 1 daily in the morning   pantoprazole (PROTONIX) 40 MG tablet Take 1 tablet (40 mg total) by mouth daily.   pravastatin (PRAVACHOL) 40 MG tablet Take 80 mg by mouth daily.    sacubitril-valsartan (ENTRESTO) 24-26 MG Take 1 tablet by mouth 2 (two) times daily.   TRADJENTA 5 MG TABS tablet TAKE 1 TABLET DAILY   UNABLE TO FIND Beet root   warfarin (COUMADIN) 7.5 MG tablet Take 1 tablet (7.5 mg total) by mouth daily.   No facility-administered encounter medications on file as of 01/27/2023.    ALLERGIES: Allergies  Allergen Reactions   Penicillins Hives, Other (See Comments) and Rash    Blisters on hands Blisters on hands  Blisters on hands Blisters on hands Blisters on hands Blisters on hands   Other Other (See Comments)    Joint and muscle pain and low energy. Joint and muscle pain and low energy.   Statins Other (See Comments)    Joint and muscle pain and low energy.   Penicillin G Sodium     Other reaction(s): hives   Spironolactone     Hyperkalemia Hyperkalemia Hyperkalemia   Statins Support [Acid Blockers Support]     Other reaction(s): weakness    VACCINATION STATUS:  There is no immunization history on file for this patient.  Diabetes He presents for his follow-up diabetic visit. He has type 2 diabetes mellitus. Onset time: Diagnosed at approx age of 37. His disease course has been worsening. There are no hypoglycemic  associated symptoms. Pertinent negatives for diabetes include no blurred vision, no fatigue and no foot paresthesias. There are no hypoglycemic complications. Symptoms are stable. Diabetic complications include a CVA, heart disease and nephropathy. Risk factors for coronary artery disease include diabetes mellitus, dyslipidemia, hypertension, male sex and sedentary lifestyle. Current diabetic treatment includes oral agent (dual therapy). He is compliant with treatment most of the time. His weight is fluctuating minimally. He is following a generally healthy diet. When asked about meal planning, he reported none. He has not had a previous visit with a dietitian. He rarely participates in exercise. His home blood glucose trend is increasing steadily. His breakfast blood glucose range is generally 110-130 mg/dl. His bedtime blood glucose range is generally 180-200 mg/dl. (He presents today with his meter and logs showing above target glycemic profile overall.  His POCT A1c today is 8.1%, increasing from last visit of 7.9%.  He notes he has been working on his diet but is limited due to Coumadin.  He denies any hypoglycemia, lowest reading was 87.  He does still eat snacks at night sometimes.) An ACE inhibitor/angiotensin II receptor blocker is contraindicated. He does not see a podiatrist.Eye exam is current.  Hypertension This is a chronic problem. The current episode started more than 1 year ago. The problem has been resolved since onset. The problem is controlled. Pertinent negatives include no blurred vision. There are no associated agents to hypertension. Risk factors for coronary artery disease  include diabetes mellitus, dyslipidemia, male gender and sedentary lifestyle. Past treatments include beta blockers and diuretics. The current treatment provides moderate improvement. There are no compliance problems.  Hypertensive end-organ damage includes kidney disease, CAD/MI, CVA and heart failure. Identifiable  causes of hypertension include chronic renal disease and renovascular disease.  Hyperlipidemia This is a chronic problem. The current episode started more than 1 year ago. The problem is controlled. Recent lipid tests were reviewed and are normal. Exacerbating diseases include chronic renal disease, diabetes and nephrotic syndrome. Factors aggravating his hyperlipidemia include beta blockers and fatty foods. Current antihyperlipidemic treatment includes statins. There are no compliance problems.  Risk factors for coronary artery disease include diabetes mellitus, dyslipidemia, hypertension, male sex and a sedentary lifestyle.    Review of systems  Constitutional: + Minimally fluctuating body weight,  current Body mass index is 23.73 kg/m. , no fatigue, no subjective hyperthermia, no subjective hypothermia Eyes: no blurry vision, no xerophthalmia ENT: no sore throat, no nodules palpated in throat, no dysphagia/odynophagia, no hoarseness Cardiovascular: no chest pain, no shortness of breath, no palpitations, no leg swelling Respiratory: no cough, no shortness of breath Gastrointestinal: no nausea/vomiting/diarrhea Musculoskeletal: no muscle/joint aches Skin: no rashes, no hyperemia Neurological: no tremors, no numbness, no tingling, no dizziness Psychiatric: no depression, no anxiety  Objective:     BP (!) 140/74 (BP Location: Right Arm, Patient Position: Sitting, Cuff Size: Large)   Pulse 83   Ht 5\' 6"  (1.676 m)   Wt 147 lb (66.7 kg)   BMI 23.73 kg/m   Wt Readings from Last 3 Encounters:  01/27/23 147 lb (66.7 kg)  09/25/22 144 lb 12.8 oz (65.7 kg)  05/26/22 148 lb 9.6 oz (67.4 kg)    BP Readings from Last 3 Encounters:  01/27/23 (!) 140/74  09/25/22 (!) 101/59  05/26/22 130/70     Physical Exam- Limited  Constitutional:  Body mass index is 23.73 kg/m. , not in acute distress, normal state of mind Eyes:  EOMI, no exophthalmos Musculoskeletal: no gross deformities, strength  intact in all four extremities, no gross restriction of joint movements Skin:  no rashes, no hyperemia Neurological: no tremor with outstretched hands   Diabetic Foot Exam - Simple   No data filed     CMP ( most recent) CMP     Component Value Date/Time   NA 137 09/19/2022 0833   NA 135 (A) 06/11/2021 0000   K 5.1 09/19/2022 0833   CL 101 09/19/2022 0833   CO2 28 09/19/2022 0833   GLUCOSE 253 (H) 09/19/2022 0833   BUN 34 (H) 09/19/2022 0833   BUN 33 (A) 06/11/2021 0000   CREATININE 1.32 (H) 09/19/2022 0833   CALCIUM 9.7 09/19/2022 0833   PROT 5.9 (L) 09/19/2022 0833   ALBUMIN 4.1 12/06/2021 1031   AST 17 09/19/2022 0833   ALT 16 09/19/2022 0833   ALKPHOS 36 (L) 12/06/2021 1031   BILITOT 0.6 09/19/2022 0833   GFRNONAA 52 (L) 12/06/2021 1031   GFRAA 57 (L) 08/04/2019 1449     Diabetic Labs (most recent): Lab Results  Component Value Date   HGBA1C 8.1 (A) 01/27/2023   HGBA1C 7.3 09/02/2021   HGBA1C 8.2 (A) 04/30/2021     Lipid Panel ( most recent) Lipid Panel     Component Value Date/Time   CHOL 115 09/19/2022 0833   TRIG 239 (H) 09/19/2022 0833   HDL 28 (L) 09/19/2022 0833   CHOLHDL 4.1 09/19/2022 0833   VLDL 23 07/25/2018 0443  LDLCALC 57 09/19/2022 6295      Lab Results  Component Value Date   TSH 2.13 09/19/2022   TSH 0.997 06/16/2014   FREET4 1.5 09/19/2022           Assessment & Plan:   1) Type 2 Diabetes with CKD stage 3a  - Oscar [name] has currently uncontrolled symptomatic type 2 DM since 82 years of age.  He presents today with his meter and logs showing above target glycemic profile overall.  His POCT A1c today is 8.1%, increasing from last visit of 7.9%.  He notes he has been working on his diet but is limited due to Coumadin.  He denies any hypoglycemia, lowest reading was 87.  He does still eat snacks at night sometimes.  -Recent labs reviewed.  - I had a long discussion with him about the progressive nature of diabetes and  the pathology behind its complications. -his diabetes is complicated by TIA, MI, CHF, CAD, CKD and he remains at a high risk for more acute and chronic complications which include retinopathy, and neuropathy. These are all discussed in detail with him.  - Nutritional counseling repeated at each appointment due to patients tendency to fall back in to old habits.  - The patient admits there is a room for improvement in their diet and drink choices. -  Suggestion is made for the patient to avoid simple carbohydrates from their diet including Cakes, Sweet Desserts / Pastries, Ice Cream, Soda (diet and regular), Sweet Tea, Candies, Chips, Cookies, Sweet Pastries, Store Bought Juices, Alcohol in Excess of 1-2 drinks a day, Artificial Sweeteners, Coffee Creamer, and "Sugar-free" Products. This will help patient to have stable blood glucose profile and potentially avoid unintended weight gain.   - I encouraged the patient to switch to unprocessed or minimally processed complex starch and increased protein intake (animal or plant source), fruits, and vegetables.   - Patient is advised to stick to a routine mealtimes to eat 3 meals a day and avoid unnecessary snacks (to snack only to correct hypoglycemia).  - I have approached him with the following individualized plan to manage  his diabetes and patient agrees:   Avoiding hypoglycemia is the #1 priority in his care given his age and comorbidities.  An appropriate A1c goal for him would be 7.0-7.5%.  -he is encouraged to continue monitoring blood glucose twice daily, before breakfast and before bed, and to call the clinic if he has readings less than 70 or greater than 300 for 3 tests in a row.  -He is advised to continue Tradjenta 5 mg po daily and Glipizide 10 mg XL daily with breakfast.  To avoid insulin, we can retry low dose SGLT2i to help control glucose as well as help kidneys and heart function.  Will try Farxiga 5 mg po daily.  He is aware of the  potential side effects of this medication to watch for.  He is also advised to let me know if glucose drops below 70 twice in a week after starting his Marcelline Deist, so we can reduce his Glipizide.  - he is not a candidate for Metformin due to concurrent renal insufficiency.  He cannot tolerated GLP1 due to body habitus.  -If he cannot gain control of diabetes with Tradjenta and Glipizide along with diet and exercise, may need to consider adding basal insulin, which is something he would like to avoid if possible.  - Specific targets for  A1c;  LDL, HDL,  and Triglycerides were  discussed with the patient.  2) Blood Pressure /Hypertension:  his blood pressure is controlled to target.   he is advised to continue his current medications including Coreg 12.5 mg po twice daily, Lasix 40 mg po daily, and Entresto 24-26 mg po daily.  Will defer med changes to cardiology/nephrology.  3) Lipids/Hyperlipidemia:    His most recent lipid panel from 06/11/21 shows controlled LDL of 48 and elevated triglycerides of 2316.  He is advised to continue Pravastatin 40 mg po daily at bedtime, Fish Oil, Fenofibrate, and Zetia.  Side effects and precautions discussed with him.  Will recheck lipid panel prior to next visit.  4)  Weight/Diet:  his Body mass index is 23.73 kg/m.  - He is not a candidate for weight loss. Exercise, and detailed carbohydrates information provided  -  detailed on discharge instructions.  5) Chronic Care/Health Maintenance: -he is on Statin medications and is encouraged to initiate and continue to follow up with Ophthalmology, Dentist, Podiatrist at least yearly or according to recommendations, and advised to stay away from smoking. I have recommended yearly flu vaccine and pneumonia vaccine at least every 5 years; moderate intensity exercise for up to 150 minutes weekly; and sleep for at least 7 hours a day.   - he is advised to maintain close follow up with Rebekah Chesterfield, NP for primary  care needs, as well as his other providers for optimal and coordinated care.     I spent  41  minutes in the care of the patient today including review of labs from CMP, Lipids, Thyroid Function, Hematology (current and previous including abstractions from other facilities); face-to-face time discussing  his blood glucose readings/logs, discussing hypoglycemia and hyperglycemia episodes and symptoms, medications doses, his options of short and long term treatment based on the latest standards of care / guidelines;  discussion about incorporating lifestyle medicine;  and documenting the encounter. Risk reduction counseling performed per USPSTF guidelines to reduce obesity and cardiovascular risk factors.     Please refer to Patient Instructions for Blood Glucose Monitoring and Insulin/Medications Dosing Guide"  in media tab for additional information. Please  also refer to " Patient Self Inventory" in the Media  tab for reviewed elements of pertinent patient history.  Heron Nay participated in the discussions, expressed understanding, and voiced agreement with the above plans.  All questions were answered to his satisfaction. he is encouraged to contact clinic should he have any questions or concerns prior to his return visit.   Follow up plan: - Return in about 3 months (around 04/27/2023) for Diabetes F/U with A1c in office, Bring meter and logs.  Ronny Bacon, [facility] [address] [place], [place] [address] Phone: [phone] Fax: [phone]  01/27/2023, 3:18 PM

## 2023-01-29 ENCOUNTER — Telehealth: Payer: Self-pay

## 2023-01-29 ENCOUNTER — Other Ambulatory Visit: Payer: Self-pay

## 2023-01-29 ENCOUNTER — Other Ambulatory Visit (HOSPITAL_COMMUNITY): Payer: Self-pay

## 2023-01-29 MED ORDER — DAPAGLIFLOZIN PROPANEDIOL 5 MG PO TABS: 5.0000 mg | ORAL_TABLET | Freq: Every day | ORAL | 0 refills | Status: DC

## 2023-01-29 NOTE — Telephone Encounter (Signed)
Pharmacy Patient Advocate Encounter   Received notification from CoverMyMeds that prior authorization for Oscar [name] is required/requested.   Insurance verification completed.   The patient is insured through [facility] .   Per test claim: PA required; PA submitted to above mentioned insurance via CoverMyMeds Key/confirmation #/EOC [id number] Status is pending

## 2023-01-30 NOTE — Telephone Encounter (Signed)
Pharmacy Patient Advocate Encounter  Received notification from [facility] that Prior Authorization for Oscar [name] has been DENIED.  See denial reason below. No denial letter attached in CMM. Will attach denial letter to Media tab once received.    PA #/Case ID/Reference #: [id number]

## 2023-02-02 ENCOUNTER — Other Ambulatory Visit: Payer: Self-pay | Admitting: Student

## 2023-02-03 NOTE — Telephone Encounter (Signed)
I am not sure what this means.  Does he have drug coverage with a different plan?

## 2023-02-04 NOTE — Telephone Encounter (Signed)
° °  [employer] is reviewing your PA request. Typically an electronic response will be received within 24-72 hours. To check for an update later, open this request from your dashboard. °

## 2023-02-04 NOTE — Telephone Encounter (Signed)
Attemped to send PA again. Never received a denial reason or appeal info.

## 2023-02-09 ENCOUNTER — Other Ambulatory Visit: Payer: Self-pay | Admitting: *Deleted

## 2023-02-09 ENCOUNTER — Telehealth: Payer: Self-pay | Admitting: *Deleted

## 2023-02-09 DIAGNOSIS — Z7984 Long term (current) use of oral hypoglycemic drugs: Secondary | ICD-10-CM

## 2023-02-09 DIAGNOSIS — N1831 Chronic kidney disease, stage 3a: Secondary | ICD-10-CM

## 2023-02-09 MED ORDER — DAPAGLIFLOZIN PROPANEDIOL 5 MG PO TABS
5.0000 mg | ORAL_TABLET | Freq: Every day | ORAL | 0 refills | Status: DC
Start: 2023-02-09 — End: 2023-02-10

## 2023-02-09 NOTE — Telephone Encounter (Signed)
We had a message from [facility] . They said that his Oscar [name] was covered by part D insurance, to check with the patient to find out if he had this, If so, run it through that. Talked with the patient and he does not have part D. He said that we were to send this prescription through [facility]. I will send this in to them.

## 2023-02-09 NOTE — Telephone Encounter (Signed)
Clydie Braun with [facility] left a message . She states that the patients Oscar [name] is covered by part D, she is asking that the patient be called and see if he has this, if so send the prescription for part D to cover. Patient was called and a voicemail was left asking that he call our office.

## 2023-02-10 MED ORDER — DAPAGLIFLOZIN PROPANEDIOL 5 MG PO TABS: 5.0000 mg | ORAL_TABLET | Freq: Every day | ORAL | 0 refills | Status: DC

## 2023-02-10 NOTE — Addendum Note (Signed)
Addended by: Jannifer Franklin A on: 02/10/2023 01:28 PM   Modules accepted: Orders

## 2023-02-10 NOTE — Telephone Encounter (Signed)
Spoke with pt and he states that the farxiga will be covered if sent to [facility]. Sent Rx

## 2023-02-10 NOTE — Addendum Note (Signed)
Addended by: Jannifer Franklin A on: 02/10/2023 11:59 AM   Modules accepted: Orders

## 2023-03-04 NOTE — Discharge Summary (Signed)
 Hospitalist Discharge Summary   Discharge date:   March 04, 2023 Length of stay:    LOS: 4 days    Discharge Service:   [facility] Hospitalists Discharge Attending Physician: Margart Dragon, DO Discharge to:    To Home Condition at Discharge:  fair   Mental Status On day of Discharge:  The patient is Alert and oriented to PERSON The patient is Alert And oriented to TIME The patient is Alert and oriented to [facility] course based on timeline of significant events after admission (by date):  1/8 - discussion held with patient and wife about probable diagnosis of lymphoma. Patient has palpable bilateral axillary lymph nodes palpable as seen on CT, will be referred to [facility] to help arrange outpatient biopsy for confirmation. WBC elevated likely multifactorial and in part due to lymphocytosis as seen on differential. Patient did not qualify for home O2  ______________________________________   Admission HPI   Patient admitted on: 02/28/2023 12:00 PM  Patient admitted by: Tyronne West Kos, MD  CHIEF COMPLAINT:  Cough and congestion  Day of admission HPI:   Oscar Carbonneau Rosenbloom Sr. is a 83 y.o. male with PMHx significant for A-fib, CHF, hypertension, type II DM and hyperlipidemia who presented to [facility] and is being admitted for CHF (congestive heart failure) (CMS-HCC). Patient reports 2-week history of cough, congestion and shortness of breath.  He reports cough has been productive of green phlegm but has not had any fever, chills, chest pain.  He did have diarrhea a few days ago which has resolved but no vomiting or abdominal pain, dysuria or increased frequency.  Reports generally being weak   On arrival to ER patient was noted to be in A-fib with RVR, was given 5 mg IV Cardizem with improved rate control.  White count elevated at 22.4 and chest x-ray showed left base infiltrate with effusion and increased interstitial  markings suggestive of pulmonary edema.  Other pertinent lab abnormalities proBNP 5,228.  Patient also hypoxic dropped oxygen saturation to 87% while in the ED.  Patient admitted on Home O2? - no Patient on home anticoagulant? -  yes Patient admitted with Chronic home foley catheter? - no Foley catheter placed or replaced by another service prior to admission? - no  Mental Status on Admission: The patient is Alert and oriented to PERSON The patient is Alert And oriented to TIME The patient is Alert and oriented to LOCATION   Problem List, Assessment & Plan    ASSESSMENT & PLAN (In order of descending acuity)  Leukocytosis/Probable Lymphoma: Felt to be multifactorial, in part due to pneumonia, however a large part due to lymphocytosis and probable diagnosis of lymphoma based on CT and clinical findings. Leukocytosis did not significantly improve with IV meropenem and doxycycline.   CT scan does show pneumonia in lower lungs and widespread adenopathy in mediastinum and bilateral axilla  Pneumonia Due to patient's leukocytosis and productive cough superimposed pneumonia suspected.  Started him on IV Rocephin and doxycycline for community-acquired coverage.  However in spite of being on IV antibiotics leukocytosis remains however predominantly lymphocytes, and patient's symptoms have significantly improved with antibiotic therapy.  Aspiration was ruled out as a cause after modified barium swallow.   CT scan also showed incidental finding of widespread adenopathy in the mediastinum and bilateral axilla for which lymphoma workup recommended.  A-fib with RVR: Patient had not taken his medications for 2 weeks prior to hospital stay  due to being sick.  Resumes home regimen carvedilol  and digoxin  has been rate controlled since.  On Coumadin  for anticoagulation, and INR therapeutic .     CHF exacerbation, systolic: Likely secondary to rapid A-fib and not taking medications.  2D echo 10/20 showed  EF 35 to 40%.  Continue GDMT, repeat 2D echocardiogram shows EF with some improvement 45-50%.  Continue diuresis with home lasix  now that patient is euvolemic.   Acute hypoxic respiratory failure ruled out this admission   Type 2 DM: Resumed home regimen Tradjenta  and Glucotrol , monitor CBG and keep on sliding scale insulin .   Hypertension/hyperlipidemia: Continue home regimen with hold parameters for low blood pressure   Debility PT to evaluate.  ADDITIONAL PATIENT FINDINGS OR OBSERVATIONS  Physical Exam Constitutional:      General: He is not in acute distress.    Appearance: He is ill-appearing.  HENT:     Head: Normocephalic and atraumatic.     Mouth/Throat:     Mouth: Mucous membranes are moist.     Pharynx: Oropharynx is clear. No oropharyngeal exudate.  Eyes:     General: No scleral icterus.    Pupils: Pupils are equal, round, and reactive to light.  Cardiovascular:     Rate and Rhythm: Normal rate and regular rhythm.     Pulses: Normal pulses.     Heart sounds: Normal heart sounds.  Pulmonary:     Effort: Pulmonary effort is normal.     Breath sounds: Rhonchi (occasional rhonchi clear with cough) present.  Abdominal:     General: Bowel sounds are normal. There is no distension.     Palpations: Abdomen is soft.     Tenderness: There is no abdominal tenderness. There is no guarding or rebound.  Musculoskeletal:        General: No swelling. Normal range of motion.     Right lower leg: No edema.     Left lower leg: No edema.     Comments: Axillary lympadenopathy  Skin:    General: Skin is warm and dry.  Neurological:     General: No focal deficit present.     Mental Status: He is alert and oriented to person, place, and time. Mental status is at baseline.  Psychiatric:        Mood and Affect: Mood normal.        Behavior: Behavior normal.        Thought Content: Thought content normal.      DVT prophylaxis while in hospital:  warfarin    _____________________________________  Vital Signs: BP 118/60   Pulse 68   Temp 36.7 C (98 F) (Oral)   Resp 19   Ht 167.6 cm (5' 6)   Wt 62.6 kg (138 lb)   SpO2 95%   BMI 22.27 kg/m   Nutrition:                                  Diet Instructions     Discharge diet (specify)     Discharge Nutrition Therapy: Heart Healthy                    CODE STATUS :                    Full Code   Patient discharged on Home O2? - no Patient discharged on home anticoagulant? -  yes Patient discharged with Chronic home  foley catheter? - no  Time Spent on Discharge I spent greater than 30 minutes counseling and coordinating care for the discharge of this patient. The Patient, wife, and I discussed the importance of outpatient follow-up as well as concerning signs and symptoms that would require immediate evaluation by a medical professional. The aforementioned conversation participants understand  and did show insight. I did use teachback to ensure understanding. The above participant/s is aware that not following the discussed plan, recommendations, and follow up can lead to severe negative effects on the patient's health, up to and including death.    Discharge Medications     Your Medication List     STOP taking these medications    amlodipine  2.5 MG tablet Commonly known as: NORVASC    docosahexaenoic acid-epa 120-180 mg Cap   lisinopril  40 MG tablet Commonly known as: PRINIVIL ,ZESTRIL    metFORMIN 500 MG 24 hr tablet Commonly known as: GLUCOPHAGE-XR   repaglinide 2 MG tablet Commonly known as: PRANDIN       START taking these medications    levoFLOXacin 750 MG tablet Commonly known as: LEVAQUIN Take 1 tablet (750 mg total) by mouth daily for 5 days.       CONTINUE taking these medications    acetaminophen  325 MG tablet Commonly known as: TYLENOL  Take 2 tablets (650 mg total) by mouth.   albuterol  90 mcg/actuation inhaler Commonly known as:  PROVENTIL  HFA;VENTOLIN  HFA USE 2 INHALATIONS EVERY 4 TO 6 HOURS AS NEEDED   aspirin  81 MG tablet Commonly known as: ECOTRIN Take 1 tablet (81 mg total) by mouth daily.   carvedilol  25 MG tablet Commonly known as: COREG  Take 1 tablet (25 mg total) by mouth.   digoxin  125 mcg (0.125 mg) tablet Commonly known as: LANOXIN    ENTRESTO  24-26 mg tablet Generic drug: sacubitril -valsartan    ezetimibe  10 mg tablet Commonly known as: ZETIA    fenofibrate  160 MG tablet Commonly known as: LOFIBRA   furosemide  20 MG tablet Commonly known as: LASIX    glipiZIDE  10 MG 24 hr tablet Commonly known as: GLUCOTROL  XL   ipratropium 0.02 % nebulizer solution Commonly known as: ATROVENT Take 2.5 mLs (0.5 mg dose) by nebulization 2 (two) times daily.   pantoprazole  40 MG tablet Commonly known as: Protonix  Take 1 tablet (40 mg total) by mouth daily.   pravastatin  40 MG tablet Commonly known as: PRAVACHOL    TRADJENTA  5 mg Tab Generic drug: linagliptin    warfarin 7.5 MG tablet Commonly known as: JANTOVEN        ____________________________________________  Discharge Instructions   Nutrition:                                  Diet Instructions     Discharge diet (specify)     Discharge Nutrition Therapy: Heart Healthy       Activity:                                   Activity Instructions     Activity as tolerated         Appointments:                         Appointments which have been scheduled for you    Mar 05, 2023 10:00 AM (Arrive by 9:45 AM) NEW GENERAL with Mertie Bobette Letty Mickey., MD  [facility] HEMATOLOGY ONCOLOGY [place]) [address] [place] [address] [phone]    Additional instructions:   YOU HAVE BEEN REFERRED FOR A HOME SLEEP STUDY. [facility] THEY WILL CALL YOU EITHER LATER THIS WEEK OR EARLY NEXT WEEK.   YOU HAVE BEEN REFERRED TO A GASTROENTEROLOGIST FOR YOUR SWALLOWING ISSUES. THEY WILL  CALL YOU WITH AN APPOINTMENT DATE/TIME. [facility] [address] , [place] [address] [phone]       Follow Up:                              Follow Up instructions and Outpatient Referrals    Ambulatory Referral to Gastroenterology     Reason for referral: Esophageal Stricture   Ambulatory Referral to Hematology / Oncology     Reason for referral: Possible lymphoma; may need biopsy for diagnosis   Ambulatory Referral to Home Health     Reason for referral: Weakness; Return to PLOF   Disciplines requested:  Nursing Physical Therapy Occupational Therapy     Nursing requested: Teaching/skilled observation and assessment   What teaching is needed (new diagnosis? new medications?): Medical  Management; Teaching about Lymphoma   Physical Therapy requested:  Home safety evaluation Ambulation training Transfer training Evaluate and treat Strengthening exercises     Occupational Therapy Requested:  Home safety evaluation Transfer training ADL or IADL training Cognitive training Evaluate and treat Strengthening exercises     Physician to follow patient's care: PCP Comment - Dr. Renato   Call MD for:  difficulty breathing, headache or visual disturbances     Call MD for:  extreme fatigue     Call MD for:  persistent dizziness or light-headedness     Call MD for:  persistent nausea or vomiting     Call MD for:  severe uncontrolled pain     Call MD for: Temperature > 38.5 Celsius ( > 101.3 Fahrenheit)     Discharge instructions         Allergies   Allergies  Allergen Reactions  . Penicillins Hives, Rash and Other (See Comments)    Blisters on hands  Blisters on hands Blisters on hands Blisters on hands Blisters on hands   . Other Other (See Comments)    Joint and muscle pain and low energy. Joint and muscle pain and low energy.  . Statins-Hmg-Coa Reductase Inhibitors Other (See Comments)    Joint and muscle pain and low  energy. Joint and muscle pain and low energy.   . Spironolactone      Hyperkalemia Hyperkalemia       Past Medical History  No past medical history on file.     Lab Results   Recent Labs    Units 02/28/23 1246 03/01/23 0730 03/02/23 0548 03/03/23 0514 03/04/23 0625  WBC 10*9/L 22.4* 22.2* 27.9* 27.6* 26.1*  HGB g/dL 87.6* 88.7* 88.4* 89.1* 11.0*  HCT % 36.9 34.0* 34.6* 31.7* 33.2*  PLT 10*9/L 542* 519* 541* 513* 546*   Recent Labs    Units 02/28/23 1246 03/01/23 0730 03/02/23 0548 03/04/23 0625  NA mmol/L 132* 137 135 140  K mmol/L 4.2 4.0 3.9 3.9  CL mmol/L 99 99 99 104  CO2 mmol/L 27.4 29.9 27.0 28.4  BUN mg/dL 29* 29* 28* 28*  CREATININE mg/dL 8.86 8.90 8.88 8.88  GLU mg/dL 662* 735* 864 890  CALCIUM  mg/dL 9.2  9.0 8.9 8.9  ALBUMIN g/dL 2.7*  --   --   --   PROT g/dL 6.4  --   --   --   BILITOT mg/dL 1.2  --   --   --   AST U/L 42*  --   --   --   ALT U/L 44  --   --   --   ALKPHOS U/L 69  --   --   --   MG mg/dL  --  2.1 1.9  --    Recent Labs    Units 02/28/23 1246 02/28/23 1423 02/28/23 1859 03/01/23 0730  TROPONINI ng/L 50 54* 71* 52   Recent Labs    Units 03/01/23 0730 03/02/23 1228 03/03/23 0514 03/04/23 0625  INR  3.84 2.92 3.01 2.99   Recent Labs    Units 02/28/23 1246  A1C % 8.0*    No results for input(s): HEPAIGM, HEPBSAG, HEPBIGM, HEPCAB, MITOAB in the last 168 hours. No results for input(s): WBCUA, NITRITE, LEUKOCYTESUR, BACTERIA, RBCUA, BLOODU, GLUCOSEU, PROTEINUA, KETONESU in the last 168 hours. No results for input(s): KETUR, PREGTESTUR, PREGPOC, NAURINE, LABOSMO, OSMOFT, PROTEINUR, CREATUR, PCRATIOUR, OPIAU, BENZU, TRICYCLIC, PCPU, AMPHU, COCAU, CANNAU, BARBU, URPROTELEC in the last 168 hours. No results for input(s): FTYP1, WBCFLUID, FNEUT, LYMPHSFL, FMONO, EOSFL, RBCFL, CLARITYFLUID, COLORFL in the last 168 hours. No results for input(s):  O2SOUR, FIO2ART, PHART, PCO2ART, PO2ART, HCO3ART, O2SATART, BEART in the last 72 hours.  Imaging  Echocardiogram W Colorflow Spectral Doppler Result Date: 03/03/2023 Patient Info Name:     Oscar [name] Age:     59 years DOB:     1941-01-22 Gender:     Male MRN:     [id number] Accession #:     [id number] [facility] Account #:     [id number] Ht:     168 cm Wt:     63 kg BSA:     1.71 m2 BP:     134 /     58 mmHg HR:     84 bpm Heart Rhythm:     Atrial Fibrillation Exam Date:     03/03/2023 12:17 PM Admit Date:     02/28/2023 Exam Type:     ECHOCARDIOGRAM W COLORFLOW SPECTRAL DOPPLER Technical Quality:     Fair Staff Sonographer:     Mannie Canton Supervising Physician:     Marinell Fairy How MD Referring Physician:     Tyronne Larger Bhatti Study Info Indications      - CHF Procedure(s)   Complete two-dimensional, color flow and Doppler transthoracic echocardiogram is performed. Summary   1. The left ventricle is normal in size with normal wall thickness.   2. The left ventricular systolic function is mildly decreased, LVEF is visually estimated at 45-50%.   3. There is decreased contraction of the mid anteroseptal and apical septal segments.   4. The mitral valve leaflets are mildly thickened with normal leaflet mobility.   5. Mitral annular calcification is present.   6. There is mild mitral valve regurgitation.   7. There is mild to moderate aortic regurgitation.   8. There is mild aortic valve stenosis.   9. The left atrium is severely dilated in size.   10. The right ventricle is mildly dilated in size, with low normal systolic function.   11. There is mild to moderate tricuspid regurgitation.   12. There is moderate pulmonary hypertension.   13. There is mild to moderate pulmonic regurgitation.   14. The right  atrium is mildly dilated in size.   15. The aorta at the ascending aorta is mildly dilated.   16. IVC size and inspiratory change suggest mildly elevated right atrial pressure. (5-10  mmHg). Left Ventricle   The left ventricle is normal in size with normal wall thickness. The left ventricular systolic function is mildly decreased, LVEF is visually estimated at 45-50%. Left ventricular diastolic function cannot be accurately assessed. There is decreased contraction of the mid anteroseptal and apical septal segments. Right Ventricle   The right ventricle is mildly dilated in size, with low normal systolic function. Left Atrium   The left atrium is severely dilated in size. Right Atrium   The right atrium is mildly dilated in size. Atrial Septum   Color doppler signal is suboptimal but suspect small interatrial communication (ASD vs PFO) with left to right shunting. Aortic Valve   The aortic valve is trileaflet with mildly thickened leaflets with mildly reduced excursion. There is mild to moderate aortic regurgitation. There is mild aortic valve stenosis. Peak AV transvalvular velocity:  2.1 m/s. Mean gradient: 8 mmHg. Doppler velocity index: 0.42. Estimated aortic valve area (VTI): 1.4 cm2. Mitral Valve   The mitral valve leaflets are mildly thickened with normal leaflet mobility. Mitral annular calcification is present. There is mild mitral valve regurgitation. Tricuspid Valve   The tricuspid valve leaflets are normal, with normal leaflet mobility. There is mild to moderate tricuspid regurgitation. There is moderate pulmonary hypertension. TR maximum velocity: 3.5 m/s  Estimated PASP: 58 mmHg. Pulmonic Valve   The pulmonic valve is poorly visualized, but probably normal. There is mild to moderate pulmonic regurgitation. There is no evidence of a significant transvalvular gradient. Aorta   The aorta at the ascending aorta is mildly dilated. Inferior Vena Cava   IVC size and inspiratory change suggest mildly elevated right atrial pressure. (5-10 mmHg). Pericardium/Pleural   There is no pericardial effusion. Ventricles ---------------------------------------------------------------------- Name                                  Value        Normal ---------------------------------------------------------------------- LV Dimensions 2D/MM ----------------------------------------------------------------------  IVS Diastolic Thickness (2D)                                1.0 cm       0.6-1.0 LVID Diastole (2D)                  4.7 cm       4.2-5.8  LVPW Diastolic Thickness (2D)                                1.0 cm       0.6-1.0 LVID Systole (2D)                   3.5 cm       2.5-4.0 LVOT Diameter                       2.0 cm               LV Mass Index (2D Cubed)           96 g/m2        49-115  Relative Wall Thickness (2D)  0.43        <=0.42 LV Function ---------------------------------------------------------------------- LV EF (4C MOD)                        44 %                LV Diastolic Volume Index (BP MOD)                        48.7 ml/m2     34.0-74.0 LV EF (BP MOD)                        40 %         52-72 RV Dimensions 2D/MM ----------------------------------------------------------------------  RV Basal Diastolic Dimension                           4.4 cm       2.5-4.1 TAPSE                               2.5 cm         >=1.7 Atria ---------------------------------------------------------------------- Name                                 Value        Normal ---------------------------------------------------------------------- LA Dimensions ---------------------------------------------------------------------- LA Dimension (2D)                   5.5 cm       3.0-4.1 LA Volume Index (4C A-L)        61.14 ml/m2               LA Volume Index (2C A-L)        86.18 ml/m2               LA Volume (BP MOD)                  119 ml               LA Volume Index (BP MOD)        69.61 ml/m2   16.00-34.00 RA Dimensions ---------------------------------------------------------------------- RA Area (4C)                      21.4 cm2        <=18.0 RA Area (4C) Index              12.5  cm2/m2               RA ESV Index (4C MOD)             37 ml/m2         18-32 Left Ventricular Outflow Tract ---------------------------------------------------------------------- Name                                 Value        Normal ---------------------------------------------------------------------- LVOT 2D ---------------------------------------------------------------------- LVOT Diameter                       2.0 cm               LVOT Area  3.1 cm2               LVOT Doppler ---------------------------------------------------------------------- LVOT Peak Velocity                 0.9 m/s               LVOT VTI                             17 cm               LVOT Stroke Volume                   53 ml               LVOT SI                           31 ml/m2 Aortic Valve ---------------------------------------------------------------------- Name                                 Value        Normal ---------------------------------------------------------------------- AV Doppler ---------------------------------------------------------------------- AV Peak Velocity                   2.1 m/s               AV Peak Gradient                   17 mmHg               AV Mean Gradient                    8 mmHg               AV VTI                               38 cm               AV Area (Cont Eq VTI)              1.4 cm2         >=3.0 AV Area Index (Cont Eq VTI)     0.8 cm2/m2               AV Area (Cont Eq Vel)              1.3 cm2               AV Area Index (Cont Eq Vel)     0.8 cm2/m2               AV DI (Vel)                           0.42               AV DI (VTI)                           0.44 Tricuspid Valve ---------------------------------------------------------------------- Name  Value        Normal ---------------------------------------------------------------------- TV Regurgitation Doppler  ---------------------------------------------------------------------- TR Peak Velocity                   3.5 m/s               Estimated PAP/RSVP ---------------------------------------------------------------------- RA Pressure                         8 mmHg           <=5 RV Systolic Pressure               58 mmHg           <36 Aorta ---------------------------------------------------------------------- Name                                 Value        Normal ---------------------------------------------------------------------- Ascending Aorta ---------------------------------------------------------------------- Ao Root Diameter (2D)               3.1 cm               Ao Root Diam Index (2D)          1.8 cm/m2               Ascending Aorta Diameter            4.0 cm Venous ---------------------------------------------------------------------- Name                                 Value        Normal ---------------------------------------------------------------------- IVC/SVC ---------------------------------------------------------------------- IVC Diameter (Exp 2D)               2.4 cm         <=2.1 Report Signatures Amended by Marinell Fairy How  MD on 03/03/2023 03:35 PM Finalized by Marinell Fairy How  MD on 03/03/2023 03:32 PM  CT Chest Wo Contrast Result Date: 03/02/2023 CLINICAL DATA:  Pneumonia/CHF.  Productive cough. EXAM: CT CHEST WITHOUT CONTRAST TECHNIQUE: Multidetector CT imaging of the chest was performed following the standard protocol without IV contrast. RADIATION DOSE REDUCTION: This exam was performed according to the departmental dose-optimization program which includes automated exposure control, adjustment of the mA and/or kV according to patient size and/or use of iterative reconstruction technique. COMPARISON:  04/21/2019 chest CT report, images not available. FINDINGS: Cardiovascular: Cardiomegaly. No pericardial effusion. Extensive atheromatous calcification of the aorta and  coronaries. Mediastinum/Nodes: Adenopathy throughout the mediastinum (thoracic inlet to retrocrural) and left more than right axilla. A subcarinal node measures 3.2 cm anterior to posterior. Left axillary lymph nodes measure up to 14 x 35 mm and are associated with axillary fat stranding that is nonspecific. There was mention of adenopathy on prior chest CT, although more extensive today by description. Semi-solid appearing material in the upper esophagus. Lungs/Pleura: Airspace disease in the lower lungs with multifocal airway debris. Trace bilateral pleural effusion. Upper Abdomen: Minimal coverage of the gallbladder fossa shows high-density that could be calcification within arteries or gallbladder lumen. Anticipate abdominal staging. Musculoskeletal: No acute or aggressive finding. Remote lateral left rib fractures.   1. Pneumonia in the lower lungs. Extensive airway opacification and semi-solid material in the upper esophagus, correlate for underlying aspiration history. 2. Widespread adenopathy in the mediastinum and bilateral axilla, need workup for lymphoma. Electronically Signed  By: Dorn Roulette M.D.   On: 03/02/2023 09:22   ECG 12 Lead Result Date: 03/01/2023 Atrial fibrillation with rapid ventricular response Anterior infarct , age undetermined Abnormal ECG No previous ECGs available  ECG 12 Lead Result Date: 03/01/2023 Atrial fibrillation Rightward axis Anterior infarct (cited on or before 28-Feb-2023) Abnormal ECG When compared with ECG of 28-Feb-2023 11:51, QRS axis shifted right Non-specific change in ST segment in Lateral leads Nonspecific T wave abnormality no longer evident in Inferior leads  XR Chest Portable Result Date: 02/28/2023 CLINICAL DATA:  Cough and congestion for 2 weeks. EXAM: PORTABLE CHEST 1 VIEW COMPARISON:  New red 07/27/2018 FINDINGS: Normal heart size. Aortic atherosclerosis. Small left pleural effusion. Mild increase interstitial markings are identified within the  lung bases. Atelectasis versus airspace disease noted in the left base.   1. Small left pleural effusion. 2. Left base atelectasis versus airspace disease. 3. Mild increase interstitial markings within the lung bases suggestive of interstitial edema. Electronically Signed   By: Waddell Calk M.D.   On: 02/28/2023 12:51    Margart Dragon, DO Hospitalist, [facility] 03/04/23, 5:11 PM

## 2023-03-05 NOTE — Progress Notes (Signed)
 Patient in clinic today for consultation of adenopathy. Height, weight and vitals obtained. Denies pain. No other concerns noted. MD aware.   Will schedule any tests or procedures ordered by provider. Will arrange an referrals as indicated by provider. MyChart pending.

## 2023-03-05 NOTE — Progress Notes (Signed)
 [facility], [facility]  Hematology Oncology Consultation  Initial Visit Note  Patient Name: Oscar Lott Cartaya Sr. Patient Age: 83 y.o. Encounter Date: 03/05/2023  Referring Physician:  Feliz Margart Fallow, DO [address] [place],  [place] [address]  Primary Care Provider: Renato Dorothey HERO, NP  Consulting Provider: Mertie Been Letty Raddle., MD  Hematology/Oncology  Reason for visit: Evaluation of adenopathy.   Assessment/Plan: I discussed with the patient today about his mediastinal and bilateral axillary adenopathy.  The possibility of a malignancy like lymphoma is a reality especially because the left axillary lymphnode is really big (3-4 cm).  The leukocytosis with neutrophilia and lymphocytosis as well as thrombocytosis is nonspecific and probably reactive in nature.  I think the best course of action is to send him to see a general surgeon for an excisional lymph node biopsy (left axilla).  He will return for further discussion once the result is available.   I have reviewed the laboratory, pathology, and radiology reports in detail and discussed findings with the patient.  History of Present Illness:   Oscar Andrus Sr. is a 83 y.o. male who is seen in consultation at the request of Feliz Margart Fallow* for an evaluation of adenopathy.  The patient was discharged from the hospital yesterday where he was admitted due to decompensated congestive heart failure.  He also had atrial fibrillation with rapid ventricular response.  He also had cough which was productive with leukocytosis.  He was treated with IV antibiotics for possible pneumonia.  CT scan of the chest revealed significant mediastinal as well as bilateral axillary adenopathy. He was referred to us  for the possibility of lymphoma.    Past Medical History:  Diagnosis Date  . Chronic kidney disease   . Myocardial infarction (CMS-HCC)     No past surgical history on file.    History reviewed. No pertinent family history.  No family status information on file.   Social History   Occupational History  . Not on file  Tobacco Use  . Smoking status: Former    Current packs/day: 0.00    Average packs/day: 2.0 packs/day for 20.0 years (40.0 ttl pk-yrs)    Types: Cigarettes    Start date: 58    Quit date: 2002    Years since quitting: 23.0    Passive exposure: Never  . Smokeless tobacco: Never  Vaping Use  . Vaping status: Never Used  Substance and Sexual Activity  . Alcohol use: Never  . Drug use: Never  . Sexual activity: Not Currently    Partners: Female    Allergies  Allergen Reactions  . Penicillins Hives, Rash and Other (See Comments)    Blisters on hands  Blisters on hands Blisters on hands Blisters on hands Blisters on hands   . Other Other (See Comments)    Joint and muscle pain and low energy. Joint and muscle pain and low energy.  . Statins-Hmg-Coa Reductase Inhibitors Other (See Comments)    Joint and muscle pain and low energy. Joint and muscle pain and low energy.   . Spironolactone      Hyperkalemia Hyperkalemia     Current Outpatient Medications  Medication Sig Dispense Refill  . acetaminophen  (TYLENOL ) 325 MG tablet Take 2 tablets (650 mg total) by mouth.    . albuterol  HFA 90 mcg/actuation inhaler USE 2 INHALATIONS EVERY 4 TO 6 HOURS AS NEEDED    . aspirin  (ECOTRIN) 81 MG tablet Take 1 tablet (81 mg  total) by mouth daily.    . carvedilol  (COREG ) 25 MG tablet Take 1 tablet (25 mg total) by mouth.    . digoxin  (LANOXIN ) 125 mcg (0.125 mg) tablet     . ENTRESTO  24-26 mg tablet     . ezetimibe  (ZETIA ) 10 mg tablet     . fenofibrate  (LOFIBRA) 160 MG tablet     . furosemide  (LASIX ) 20 MG tablet     . glipiZIDE  (GLUCOTROL  XL) 10 MG 24 hr tablet     . ipratropium (ATROVENT) 0.02 % nebulizer solution Take 2.5 mLs (0.5 mg dose) by nebulization 2 (two) times daily.    SABRA levoFLOXacin (LEVAQUIN) 750 MG tablet Take 1 tablet  (750 mg total) by mouth daily for 5 days. 5 tablet 0  . pantoprazole  (PROTONIX ) 40 MG tablet Take 1 tablet (40 mg total) by mouth daily.    . pravastatin  (PRAVACHOL ) 40 MG tablet     . TRADJENTA  5 mg Tab     . warfarin (COUMADIN ) 7.5 MG tablet      No current facility-administered medications for this visit.    Review of Systems  Constitutional:  Positive for fatigue.  HENT:  Negative.    Eyes: Negative.   Respiratory:  Positive for cough and shortness of breath.   Cardiovascular: Negative.   Gastrointestinal: Negative.   Endocrine: Negative.   Genitourinary: Negative.    Musculoskeletal: Negative.   Skin: Negative.   Neurological: Negative.   Hematological: Negative.      Vital Signs for this encounter: BSA: 1.69 meters squared BP 127/62   Pulse 78   Temp 35.6 C (96.1 F) (Temporal)   Resp 18   Ht 160 cm (5' 2.99)   Wt 63.9 kg (140 lb 12.8 oz)   SpO2 97%   BMI 24.95 kg/m   Physical Exam Constitutional:      Appearance: Normal appearance.  HENT:     Head: Normocephalic.     Mouth/Throat:     Mouth: Mucous membranes are moist.     Pharynx: Oropharynx is clear.  Eyes:     Conjunctiva/sclera: Conjunctivae normal.     Pupils: Pupils are equal, round, and reactive to light.  Pulmonary:     Breath sounds: Rhonchi present.  Abdominal:     General: Abdomen is flat. Bowel sounds are normal.     Palpations: Abdomen is soft.  Musculoskeletal:        General: Normal range of motion.     Cervical back: Neck supple.  Skin:    General: Skin is warm.  Neurological:     General: No focal deficit present.     Mental Status: He is alert.     Karnofsky/Lansky Performance Status 70, Cares for self; unable to carry on normal activity or to do active work (ECOG equivalent 1)  Results:  WBC  Date Value Ref Range Status  03/04/2023 26.1 (H) 4.0 - 10.5 10*9/L Final  04/21/2019 9.7 4.0 - 10.5 10^3/uL Final   HGB  Date Value Ref Range Status  03/04/2023 11.0 (L) 12.5  - 17.0 g/dL Final  97/74/7978 86.3 12.5 - 17.0 g/dL Final   HCT  Date Value Ref Range Status  03/04/2023 33.2 (L) 36.0 - 50.0 % Final  04/21/2019 41.7 36.0 - 50.0 % Final   Platelet  Date Value Ref Range Status  03/04/2023 546 (H) 140 - 415 10*9/L Final  04/21/2019 349 140 - 415 10^3/uL Final   Creatinine  Date Value Ref Range Status  03/04/2023  1.11 0.80 - 1.30 mg/dL Final  97/74/7978 8.59 (H) 0.6 - 1.3 mg/dL Final   AST  Date Value Ref Range Status  02/28/2023 42 (H) 15 - 40 U/L Final  04/21/2019 32.8 10 - 42 IU/L Final    I spent 60 minutes reviewing the records and face-to-face with the patient today.   Mertie Azure, MD

## 2023-03-19 ENCOUNTER — Telehealth: Payer: Self-pay | Admitting: *Deleted

## 2023-03-19 NOTE — Telephone Encounter (Signed)
   Pre-operative Risk Assessment    Patient Name: Oscar [name]  DOB: 29-May-1940 MRN: [id number]   Date of last office visit: 04/23/22 DR. HOCHREIN  Date of next office visit: 04/17/23 Randall An, [facility]   Request for Surgical Clearance    Procedure:  LEFT AXILLARY LYMPH NODE Bx  Date of Surgery:  Clearance 04/02/23                                Surgeon:  DR. Elzie Rings Surgeon's Group or Practice Name:  [facility] Phone number:  [phone] Fax number:  [phone]   Type of Clearance Requested:   - Medical  - Pharmacy:  Hold Warfarin (Coumadin) x 5 DAYS PRIOR   Type of Anesthesia:  General    Additional requests/questions:    Elpidio Anis   03/19/2023, 4:31 PM

## 2023-03-19 NOTE — Telephone Encounter (Signed)
Pharmacy please advise on holding Coumadin prior to left axillary lymph node biopsy scheduled for 04/02/2023. Thank you.

## 2023-03-20 NOTE — Telephone Encounter (Signed)
   Name: Oscar [name]  DOB: [birth year]  MRN: [id number]  Primary Cardiologist: Rollene Rotunda, MD   Preoperative team, please contact this patient and set up a phone call appointment for further preoperative risk assessment. Please obtain consent and complete medication review. Thank you for your help.  I confirm that guidance regarding antiplatelet and oral anticoagulation therapy has been completed and, if necessary, noted below.  Warfarin managed by PCP, will defer to PCP for warfarin hold.   I also confirmed the patient resides in the state of [place]. As per [facility] Medical Board telemedicine laws, the patient must reside in the state in which the provider is licensed.   Joylene Grapes, NP 03/20/2023, 2:53 PM [facility]

## 2023-03-20 NOTE — Telephone Encounter (Signed)
Warfarin managed by PCP, will defer to PCP for warfarin hold

## 2023-03-23 ENCOUNTER — Telehealth: Payer: Self-pay | Admitting: *Deleted

## 2023-03-23 NOTE — Telephone Encounter (Signed)
Pt has been added on per Tereso Newcomer, PAC. Med rec and consent are done. Pt is aware PCP to give clearance for Coumadin, he states he has already d/w PCP.      Patient Consent for Virtual Visit        KENDRYCK LACROIX has provided verbal consent on 03/23/2023 for a virtual visit (video or telephone).   CONSENT FOR VIRTUAL VISIT FOR:  Lester Kinsman Boissonneault  By participating in this virtual visit I agree to the following:  I hereby voluntarily request, consent and authorize [facility] and its employed or contracted physicians, physician assistants, nurse practitioners or other licensed health care professionals (the Practitioner), to provide me with telemedicine health care services (the "Services") as deemed necessary by the treating Practitioner. I acknowledge and consent to receive the Services by the Practitioner via telemedicine. I understand that the telemedicine visit will involve communicating with the Practitioner through live audiovisual communication technology and the disclosure of certain medical information by electronic transmission. I acknowledge that I have been given the opportunity to request an in-person assessment or other available alternative prior to the telemedicine visit and am voluntarily participating in the telemedicine visit.  I understand that I have the right to withhold or withdraw my consent to the use of telemedicine in the course of my care at any time, without affecting my right to future care or treatment, and that the Practitioner or I may terminate the telemedicine visit at any time. I understand that I have the right to inspect all information obtained and/or recorded in the course of the telemedicine visit and may receive copies of available information for a reasonable fee.  I understand that some of the potential risks of receiving the Services via telemedicine include:  Delay or interruption in medical evaluation due to technological equipment failure  or disruption; Information transmitted may not be sufficient (e.g. poor resolution of images) to allow for appropriate medical decision making by the Practitioner; and/or  In rare instances, security protocols could fail, causing a breach of personal health information.  Furthermore, I acknowledge that it is my responsibility to provide information about my medical history, conditions and care that is complete and accurate to the best of my ability. I acknowledge that Practitioner's advice, recommendations, and/or decision may be based on factors not within their control, such as incomplete or inaccurate data provided by me or distortions of diagnostic images or specimens that may result from electronic transmissions. I understand that the practice of medicine is not an exact science and that Practitioner makes no warranties or guarantees regarding treatment outcomes. I acknowledge that a copy of this consent can be made available to me via my patient portal [facility] MyChart), or I can request a printed copy by calling the office of [facility].    I understand that my insurance will be billed for this visit.   I have read or had this consent read to me. I understand the contents of this consent, which adequately explains the benefits and risks of the Services being provided via telemedicine.  I have been provided ample opportunity to ask questions regarding this consent and the Services and have had my questions answered to my satisfaction. I give my informed consent for the services to be provided through the use of telemedicine in my medical care

## 2023-03-23 NOTE — Telephone Encounter (Signed)
Will forward to preop APP to approve provider slot as we have no openings for tele visit at this time.

## 2023-03-23 NOTE — Telephone Encounter (Signed)
Left message to call back to schedule tele preop appt. Per preop APP Tereso Newcomer, PAC add pt on tomorrow with him at 11 or 3:20.

## 2023-03-23 NOTE — Telephone Encounter (Signed)
Pt has been added on per Tereso Newcomer, PAC. Med rec and consent are done. Pt is aware PCP to give clearance for Coumadin, he states he has already d/w PCP.

## 2023-03-24 ENCOUNTER — Ambulatory Visit: Payer: Medicare Other | Attending: Physician Assistant | Admitting: Physician Assistant

## 2023-03-24 DIAGNOSIS — Z01810 Encounter for preprocedural cardiovascular examination: Secondary | ICD-10-CM

## 2023-03-24 NOTE — Progress Notes (Signed)
Virtual Visit via Telephone Note   Because of Oscar [name]'s co-morbid illnesses, he is at least at moderate risk for complications without adequate follow up.  This format is felt to be most appropriate for this patient at this time.  The patient did not have access to video technology/had technical difficulties with video requiring transitioning to audio format only (telephone).  All issues noted in this document were discussed and addressed.  No physical exam could be performed with this format.  Please refer to the patient's chart for his consent to telehealth for [facility].  Evaluation Performed:  Preoperative cardiovascular risk assessment _____________   Date:  03/24/2023   Patient ID:  Oscar [name], DOB 06/29/1940, MRN [id number] Patient Location:  [place] Provider location:   Office  Primary Care Provider:  Rebekah Chesterfield, NP Primary Cardiologist:  Rollene Rotunda, MD  Chief Complaint / Patient Profile   83 y.o. y/o male with a h/o   Coronary artery disease  LHC 07/29/2018: LM ostial 75, LAD proximal 99; LCx ostial 30, proximal 70, LPDA 40; RCA proximal 100 Patient declined CABG and high risk PCI>> med Rx (HFrEF) heart failure with reduced ejection fraction  Ischemic CM TTE 12/07/2018: EF 35-40, mild LVH, septal apical and inferior HK, moderate BAE, mild MR, mild TR, mild AI, mild PI, moderately elevated PASP ? BP limits GDMT TTE 03/03/2023 ([facility]): EF 45-50, anteroseptal and apical septal HK, mild MR, mild-moderate AI, mild aortic stenosis, severe LAE, low normal RVSF, mild-moderate TR, moderate pulmonary hypertension, mild-moderate PI, mild RAE, mildly dilated ascending aorta, PASP 58, mean AV gradient 8 mmHg Permanent atrial fibrillation  Carotid artery disease S/p Bilat CEA Hypertension  Hyperlipidemia  Diabetes mellitus  Chronic kidney disease  Chronic Obstructive Pulmonary Disease   He is pending left axillary lymph node  biopsy on 04/02/2023 with Dr. Shelton Silvas under general anesthesia and presents today for telephonic preoperative cardiovascular risk assessment.  The patient needs to hold Coumadin for his procedure.  However, this is managed by his primary care provider.    History of Present Illness    Oscar [name] is a 83 y.o. male who presents via audio/video conferencing for a telehealth visit today.  Pt was last seen in cardiology clinic on 04/23/2022 by Dr. Antoine Poche.  At that time EMMANUELLE COXE was doing well.  The patient is now pending procedure as outlined above. Since his last visit, he was admitted to [facility] earlier this month.  I reviewed the discharge summary while he was on the phone.  He was admitted with pneumonia complicated by atrial fibrillation with RVR and acute on chronic heart failure.  He was diuresed.  He was treated with antibiotics.  Echocardiogram demonstrated improved LV function with EF 45-50.  High sensitivity troponins were minimally elevated and flat consistent with demand ischemia.  This would be consistent with his known anatomy.  CT did demonstrate widespread mediastinal and bilateral axilla adenopathy.  Findings are concerning for lymphoma.    Physical Exam    Vital Signs:  JAQUAWN SAFFRAN does not have vital signs available for review today.  Given telephonic nature of communication, physical exam is limited. AAOx3. NAD. Normal affect.  Speech and respirations are unlabored.  Accessory Clinical Findings    None    Assessment & Plan    Assessment & Plan Preoperative cardiovascular examination Mr. Barra's perioperative risk of a major cardiac event is significantly elevated at 11% according to the Revised Cardiac  Risk Index (RCRI).  Therefore, he is at high risk for perioperative complications.   His functional capacity is good at 4.31 METs according to the Duke Activity Status Index (DASI).  In review of his chart, it sounds as though his lymph node biopsy is  urgent. Recommendations: According to ACC/AHA guidelines, no further cardiovascular testing needed.  The patient may proceed to surgery at acceptable risk.   Antiplatelet and/or Anticoagulation Recommendations: His warfarin is managed by primary care.  Recommendations for perioperative management should come from his primary care provider.   The patient was advised that if he develops new symptoms prior to surgery to contact our office to arrange for a follow-up visit, and he verbalized understanding.  A copy of this note will be routed to requesting surgeon.  Time:   Today, I have spent 14 minutes with the patient with telehealth technology discussing medical history, symptoms, and management plan.     Tereso Newcomer, PA-C  03/24/2023, 3:45 PM

## 2023-03-24 NOTE — Telephone Encounter (Signed)
I s/w the pt's wife and she has been made aware to disregard the vm left a few minutes earlier. Pt will need to keep his tele appt today @ 3:20. Pt's wife thanked me for the help and the call.

## 2023-03-24 NOTE — Telephone Encounter (Signed)
Notes faxed to surgeon. This phone note will be removed from the preop pool. Tereso Newcomer, PA-C  03/24/2023 3:47 PM

## 2023-03-24 NOTE — Telephone Encounter (Signed)
Per pre op APP Tereso Newcomer, [facility] pt surgery date is 04/24/23. I did d/w preop APP further. This was in error and the surgery date is 04/02/23. Per preop APP keep the tele appt today.

## 2023-03-24 NOTE — Telephone Encounter (Signed)
Pt has appt with Randall An, PA-C 2/21 and surgery scheduled for 2/28. He can get clearance at OV with Ms Iran Ouch. Please confirm with patient. If ok I will send to Ms. Strader and remove from preop pool. Tereso Newcomer, PA-C    03/24/2023 7:55 AM

## 2023-04-06 ENCOUNTER — Ambulatory Visit (INDEPENDENT_AMBULATORY_CARE_PROVIDER_SITE_OTHER): Payer: Medicare Other | Admitting: Gastroenterology

## 2023-04-07 ENCOUNTER — Other Ambulatory Visit: Payer: Self-pay

## 2023-04-07 DIAGNOSIS — Z7984 Long term (current) use of oral hypoglycemic drugs: Secondary | ICD-10-CM

## 2023-04-07 DIAGNOSIS — N1831 Chronic kidney disease, stage 3a: Secondary | ICD-10-CM

## 2023-04-07 MED ORDER — DAPAGLIFLOZIN PROPANEDIOL 5 MG PO TABS: 5.0000 mg | ORAL_TABLET | Freq: Every day | ORAL | 0 refills | Status: DC

## 2023-04-16 NOTE — Progress Notes (Deleted)
 Cardiology Office Note    Date:  04/16/2023  ID:  Oscar [name], DOB 09-13-1940, MRN [id number] Cardiologist: Rollene Rotunda, MD    History of Present Illness:    Oscar [name] is a 83 y.o. male with past medical history of chronic HFrEF (EF 25-30% by echo in 07/2018, at 35-40% by repeat echo in 11/2018), CAD (catheterization in 07/2018 showing 100% Proximal-RCA stenosis, 99% Proximal-LAD, 75% Ost LM, 70% LCx, 40% PDA, and 30% Ost LCx with patient declining CABG along with declining arthrectomy with Impella support due to high-risk status), permanent atrial fibrillation, carotid artery stenosis (s/p L CEA in 2007 and R CEA in 07/2018), HTN, HLD and Stage 3 CKD who presents to the office today for annual follow-up.  He was last examined by Dr. Antoine Poche in 03/2022 and was overall doing well at that time and denied any recent anginal symptoms. BP had previously limited titration of medical therapy and he did have hyperkalemia with Spironolactone. Endocrinology had also previously discontinued his SGLT2 inhibitor.  He was continued on Coumadin, ASA 81 mg daily, Coreg 12.5 mg twice daily, Digoxin 125 mcg daily, Zetia 10 mg daily, Lasix 40 mg daily, Pravastatin 80 mg daily and Entresto 24-26 mg twice daily.  The office did receive a cardiac clearance request in 02/2023 for clearance in regards to upcoming left axillary lymph node biopsy. He had recently been hospitalized for pneumonia, atrial fibrillation with RVR and acute CHF exacerbation. Repeat echocardiogram at that time did show EF had improved to 45 to 50%. CT imaging during admission did show widespread mediastinal and bilateral axilla adenopathy which was concerning for lymphoma. His biopsy was overall felt to be urgent and he was high-risk given his significant past medical history. Holding Coumadin was deferred to his PCP.   ROS: ***  Studies Reviewed:   EKG: EKG is*** ordered today and demonstrates ***   EKG  Interpretation Date/Time:    Ventricular Rate:    PR Interval:    QRS Duration:    QT Interval:    QTC Calculation:   R Axis:      Text Interpretation:         R/LHC: 07/2018 Prox RCA lesion is 100% stenosed. Prox LAD lesion is 99% stenosed. Ost LM lesion is 75% stenosed. Prox Cx to Mid Cx lesion is 70% stenosed. LPDA lesion is 40% stenosed. Ost Cx to Prox Cx lesion is 30% stenosed.   Findings:   Ao = 148/81 (109)  LV = 13215 RA = 7 RV = 59/6 PA = 59/24 (37) PCW = 22 Fick cardiac output/index = 4.1/2.3 PVR = 3.3 WU Ao sat = 93% PA sat = 62%, 64%   Assessment: 1. Severe 3v CAD in left dominant system with 70-80% LM lesion and 99% proximal LAD lesion 2. Ischemic CM EF 25% 3. Mild to moderate pulmonary HTN 4. Moderately reduced CO   Plan/Discussion:   Will d/w with TCTS regarding possible CABG and Maze. If not surgical candidate will need PCI of LAD with atherectomy +/- LM PCI.    I discussed with his wife by phone as well.   Echocardiogram: 11/2018 IMPRESSIONS     1. Left ventricular ejection fraction, by visual estimation, is 35 to  40%. The left ventricle has normal function. Mildly increased left  ventricular size. There is mildly increased left ventricular hypertrophy.   2. Septal apical and inferior wall hypokinesis.   3. Global right ventricle has normal systolic function.The right  ventricular size is  normal. No increase in right ventricular wall  thickness.   4. Left atrial size was moderately dilated.   5. Right atrial size was moderately dilated.   6. Moderate calcification of the mitral valve leaflet(s).   7. Moderate mitral annular calcification.   8. Moderate thickening of the mitral valve leaflet(s).   9. The mitral valve is normal in structure. Mild mitral valve  regurgitation. No evidence of mitral stenosis.  10. The tricuspid valve is normal in structure. Tricuspid valve  regurgitation is mild.  11. The aortic valve is tricuspid  Aortic valve regurgitation is mild by  color flow Doppler. Mild aortic valve sclerosis without stenosis.  12. The pulmonic valve was normal in structure. Pulmonic valve  regurgitation is mild by color flow Doppler.  13. Moderately elevated pulmonary artery systolic pressure.  14. The inferior vena cava is normal in size with greater than 50%  respiratory variability, suggesting right atrial pressure of 3 mmHg.    Echocardiogram: 02/2023 Summary   1. The left ventricle is normal in size with normal wall thickness.    2. The left ventricular systolic function is mildly decreased, LVEF is  visually estimated at 45-50%.    3. There is decreased contraction of the mid anteroseptal and apical septal  segments.   4. The mitral valve leaflets are mildly thickened with normal leaflet  mobility.   5. Mitral annular calcification is present.    6. There is mild mitral valve regurgitation.    7. There is mild to moderate aortic regurgitation.    8. There is mild aortic valve stenosis.    9. The left atrium is severely dilated in size.    10. The right ventricle is mildly dilated in size, with low normal systolic  function.   11. There is mild to moderate tricuspid regurgitation.    12. There is moderate pulmonary hypertension.    13. There is mild to moderate pulmonic regurgitation.    14. The right atrium is mildly dilated in size.    15. The aorta at the ascending aorta is mildly dilated.    16. IVC size and inspiratory change suggest mildly elevated right atrial  pressure. (5-10 mmHg).   Risk Assessment/Calculations:   {Does this patient have ATRIAL FIBRILLATION?[phone]} No BP recorded.  {Refresh Note OR Click here to enter BP  :1}***         Physical Exam:   VS:  There were no vitals taken for this visit.   Wt Readings from Last 3 Encounters:  01/27/23 147 lb (66.7 kg)  09/25/22 144 lb 12.8 oz (65.7 kg)  05/26/22 148 lb 9.6 oz (67.4 kg)     GEN: Well nourished, well  developed in no acute distress NECK: No JVD; No carotid bruits CARDIAC: ***RRR, no murmurs, rubs, gallops RESPIRATORY:  Clear to auscultation without rales, wheezing or rhonchi  ABDOMEN: Appears non-distended. No obvious abdominal masses. EXTREMITIES: No clubbing or cyanosis. No edema.  Distal pedal pulses are 2+ bilaterally.   Assessment and Plan:      {Are you ordering a CV Procedure (e.g. stress test, cath, DCCV, TEE, etc)?   Press F2        :[id number]}   Signed, Ellsworth Lennox, PA-C

## 2023-04-17 ENCOUNTER — Ambulatory Visit: Payer: Medicare Other | Admitting: Student

## 2023-04-22 ENCOUNTER — Other Ambulatory Visit: Payer: Self-pay | Admitting: Nurse Practitioner

## 2023-05-04 ENCOUNTER — Ambulatory Visit: Payer: TRICARE For Life (TFL) | Admitting: Nurse Practitioner

## 2023-05-04 DIAGNOSIS — Z7984 Long term (current) use of oral hypoglycemic drugs: Secondary | ICD-10-CM

## 2023-05-04 DIAGNOSIS — E785 Hyperlipidemia, unspecified: Secondary | ICD-10-CM

## 2023-05-04 DIAGNOSIS — I10 Essential (primary) hypertension: Secondary | ICD-10-CM

## 2023-05-04 DIAGNOSIS — N1831 Chronic kidney disease, stage 3a: Secondary | ICD-10-CM

## 2023-05-26 ENCOUNTER — Ambulatory Visit (INDEPENDENT_AMBULATORY_CARE_PROVIDER_SITE_OTHER): Admitting: Nurse Practitioner

## 2023-05-26 ENCOUNTER — Encounter: Payer: Self-pay | Admitting: Nurse Practitioner

## 2023-05-26 VITALS — BP 130/60 | HR 84 | Ht 66.0 in | Wt 143.4 lb

## 2023-05-26 DIAGNOSIS — I10 Essential (primary) hypertension: Secondary | ICD-10-CM

## 2023-05-26 DIAGNOSIS — N1831 Chronic kidney disease, stage 3a: Secondary | ICD-10-CM | POA: Diagnosis not present

## 2023-05-26 DIAGNOSIS — Z7984 Long term (current) use of oral hypoglycemic drugs: Secondary | ICD-10-CM | POA: Diagnosis not present

## 2023-05-26 DIAGNOSIS — E785 Hyperlipidemia, unspecified: Secondary | ICD-10-CM | POA: Diagnosis not present

## 2023-05-26 DIAGNOSIS — E1122 Type 2 diabetes mellitus with diabetic chronic kidney disease: Secondary | ICD-10-CM

## 2023-05-26 LAB — POCT GLYCOSYLATED HEMOGLOBIN (HGB A1C): Hemoglobin A1C: 7.6 % — AB (ref 4.0–5.6)

## 2023-05-26 NOTE — Progress Notes (Signed)
 Endocrinology Follow Up Note       05/26/2023, 1:30 PM   Subjective:    Patient ID: Oscar [name], male    DOB: 1940/03/18.  Oscar [name] is being seen in follow up after being seen in consultation for management of currently uncontrolled symptomatic diabetes requested by  Rebekah Chesterfield, NP.   Past Medical History:  Diagnosis Date   Anemia    Atrial fibrillation (HCC)    permanent   CAD (coronary artery disease)    a. cath on 07/29/2018 showed 100% Proximal-RCA stenosis, 99% Proximal-LAD, 75% Ost LM, 70% LCx, 40% PDA, and 30% Ost LCx. Declined CABG   Cardiomyopathy    EF 35% in the past--Improved to 55% echo 2/10. Moderately severe mitral regurgitation in the past, now improved,   Carotid artery stenosis    CHF (congestive heart failure) (HCC) 2002   COPD (chronic obstructive pulmonary disease) (HCC)    Coronary artery disease    non obstructive   Diabetes mellitus    Dyslipidemia    History of tobacco abuse    Nephrolithiasis    Tremors of nervous system     Past Surgical History:  Procedure Laterality Date   CAROTID ENDARTERECTOMY Left 10-21-05   cea   CATARACT EXTRACTION W/PHACO Left 09/10/2018   Procedure: CATARACT EXTRACTION PHACO AND INTRAOCULAR LENS PLACEMENT LEFT EYE;  Surgeon: Fabio Pierce, MD;  Location: [facility];  Service: Ophthalmology;  Laterality: Left;  left   CATARACT EXTRACTION W/PHACO Right 11/12/2018   Procedure: CATARACT EXTRACTION PHACO AND INTRAOCULAR LENS PLACEMENT RIGHT EYE;  Surgeon: Fabio Pierce, MD;  Location: [facility];  Service: Ophthalmology;  Laterality: Right;  right   ENDARTERECTOMY Right 08/11/2018   Procedure: ENDARTERECTOMY CAROTID RIGHT;  Surgeon: Larina Earthly, MD;  Location: [facility] OR;  Service: Vascular;  Laterality: Right;   left carotid endarterectomy     PATCH ANGIOPLASTY Right 08/11/2018   Procedure: PATCH ANGIOPLAST USING HEMASHIELD PLATINUM FINESSE;  Surgeon:  Larina Earthly, MD;  Location: [facility] OR;  Service: Vascular;  Laterality: Right;   RIGHT/LEFT HEART CATH AND CORONARY ANGIOGRAPHY N/A 07/29/2018   Procedure: RIGHT/LEFT HEART CATH AND CORONARY ANGIOGRAPHY;  Surgeon: Dolores Patty, MD;  Location: [facility];  Service: Cardiovascular;  Laterality: N/A;   TONSILLECTOMY      Social History   Socioeconomic History   Marital status: Married    Spouse name: Not on file   Number of children: 5   Years of education: 12   Highest education level: Not on file  Occupational History   Occupation: Retired  Tobacco Use   Smoking status: Former    Current packs/day: 0.00    Types: Cigarettes    Quit date: 02/25/2000    Years since quitting: 23.2   Smokeless tobacco: Former  Building services engineer status: Never Used  Substance and Sexual Activity   Alcohol use: No   Drug use: No   Sexual activity: Not on file  Other Topics Concern   Not on file  Social History Narrative   Lives at home with his wife.   Right-handed.   3-4 cups caffeine per day.   Social Drivers  of Health   Financial Resource Strain: Low Risk  (02/28/2023)   Received from [facility]   Overall Financial Resource Strain (CARDIA)    Difficulty of Paying Living Expenses: Not very hard  Food Insecurity: Food Insecurity Present (02/28/2023)   Received from [facility]   Hunger Vital Sign    Worried About Running Out of Food in the Last Year: Sometimes true    Ran Out of Food in the Last Year: Sometimes true  Transportation Needs: No Transportation Needs (02/28/2023)   Received from [facility] - Transportation    Lack of Transportation (Medical): No    Lack of Transportation (Non-Medical): No  Physical Activity: Insufficiently Active (02/28/2023)   Received from [facility]   Exercise Vital Sign    Days of Exercise per Week: 1 day    Minutes of Exercise per Session: 10 min  Stress: No Stress Concern Present (02/28/2023)   Received from [facility] - Occupational Stress Questionnaire    Feeling of Stress : Not at all  Social Connections: Socially Integrated (02/28/2023)   Received from [facility]   Social Connection and Isolation Panel [NHANES]    Frequency of Communication with Friends and Family: More than three times a week    Frequency of Social Gatherings with Friends and Family: More than three times a week    Attends Religious Services: More than 4 times per year    Active Member of [employer] or Organizations: Yes    Attends Engineer, structural: More than 4 times per year    Marital Status: Married    Family History  Problem Relation Age of Onset   Coronary artery disease Mother    Diabetes Mother    Heart disease Mother        Before age 54 and  CHF   Hypertension Mother    Other Mother        varicose veins   Heart attack Mother    Heart disease Sister    Hypertension Sister    Heart attack Sister    Heart disease Brother    Hyperlipidemia Brother    Hypertension Brother    Other Brother        varicose veins   Heart attack Brother    Heart disease Son 78       Heart Disease before age 77- Open hear surgery   Deep vein thrombosis Son    Heart attack Son    Coronary artery disease Other    Diabetes Daughter    Hypertension Daughter    Other Daughter        varicose veins    Outpatient Encounter Medications as of 05/26/2023  Medication Sig   acetaminophen (TYLENOL) 325 MG tablet Take 2 tablets (650 mg total) by mouth every 6 (six) hours as needed for mild pain (or Fever >/= 101).   albuterol (PROAIR HFA) 108 (90 Base) MCG/ACT inhaler Inhale 2 puffs into the lungs every 6 (six) hours as needed for wheezing or shortness of breath.   aspirin EC 81 MG EC tablet Take 1 tablet (81 mg total) by mouth daily.   carvedilol (COREG) 12.5 MG tablet TAKE 1 TABLET TWICE A DAY WITH MEALS (PLEASE SCHEDULE AN APPOINTMENT FOR FURTHER REFILLS)   Cholecalciferol (VITAMIN  D3) 10 MCG (400 UNIT) CAPS Take 400 Units by mouth daily.   digoxin (LANOXIN) 0.125 MG tablet Take  1 tablet (125 mcg total) by mouth daily.   ENTRESTO 24-26 MG TAKE 1 TABLET TWICE A DAY   ezetimibe (ZETIA) 10 MG tablet Take 10 mg by mouth daily.   fenofibrate 160 MG tablet Take 160 mg by mouth daily.   furosemide (LASIX) 40 MG tablet TAKE 1 TABLET DAILY (MAINTENANCE DOSE IS NOW 40 MG DAILY)   GARLIC PO Take by mouth.   glipiZIDE (GLUCOTROL XL) 10 MG 24 hr tablet TAKE 1 TABLET DAILY WITH BREAKFAST   ipratropium (ATROVENT) 0.02 % nebulizer solution Take 2.5 mLs (0.5 mg dose) by nebulization 2 (two) times daily.   Multiple Vitamin (MULTIVITAMIN) tablet Take 1 tablet by mouth daily.   nitroGLYCERIN (NITROSTAT) 0.4 MG SL tablet Place 1 tablet (0.4 mg total) under the tongue every 5 (five) minutes as needed for chest pain.   Omega-3 Fatty Acids (FISH OIL) 1200 MG CAPS Take 1,000 mg by mouth daily.    ONETOUCH VERIO test strip    OVER THE COUNTER MEDICATION Over the Counter Beet Chews - patient takes 1 daily in the morning   pantoprazole (PROTONIX) 40 MG tablet Take 1 tablet (40 mg total) by mouth daily.   pravastatin (PRAVACHOL) 40 MG tablet Take 80 mg by mouth daily.    TRADJENTA 5 MG TABS tablet TAKE 1 TABLET DAILY   UNABLE TO FIND Beet root   warfarin (COUMADIN) 7.5 MG tablet Take 1 tablet (7.5 mg total) by mouth daily.   [DISCONTINUED] dapagliflozin propanediol (FARXIGA) 5 MG TABS tablet Take 1 tablet (5 mg total) by mouth daily before breakfast. (Patient not taking: Reported on 05/26/2023)   No facility-administered encounter medications on file as of 05/26/2023.    ALLERGIES: Allergies  Allergen Reactions   Penicillins Hives, Other (See Comments) and Rash    Blisters on hands Blisters on hands  Blisters on hands Blisters on hands Blisters on hands Blisters on hands   Other Other (See Comments)    Joint and muscle pain and low energy. Joint and muscle pain and low energy.   Statins  Other (See Comments)    Joint and muscle pain and low energy.   Penicillin G Sodium     Other reaction(s): hives   Spironolactone     Hyperkalemia Hyperkalemia Hyperkalemia   Statins Support [Acid Blockers Support]     Other reaction(s): weakness    VACCINATION STATUS:  There is no immunization history on file for this patient.  Diabetes He presents for his follow-up diabetic visit. He has type 2 diabetes mellitus. Onset time: Diagnosed at approx age of 78. His disease course has been improving. There are no hypoglycemic associated symptoms. Pertinent negatives for diabetes include no blurred vision, no fatigue and no foot paresthesias. There are no hypoglycemic complications. Symptoms are stable. Diabetic complications include a CVA, heart disease and nephropathy. Risk factors for coronary artery disease include diabetes mellitus, dyslipidemia, hypertension, male sex and sedentary lifestyle. Current diabetic treatment includes oral agent (dual therapy). He is compliant with treatment most of the time. His weight is fluctuating minimally. He is following a generally healthy diet. When asked about meal planning, he reported none. He has not had a previous visit with a dietitian. He rarely participates in exercise. His home blood glucose trend is decreasing steadily. His breakfast blood glucose range is generally 110-130 mg/dl. His bedtime blood glucose range is generally 140-180 mg/dl. His overall blood glucose range is 140-180 mg/dl. (He presents today with his meter and logs showing above target glycemic profile  overall.  His POCT A1c today is 7.6%, improving from last visit of 8.1%.  He notes he has been working on his diet but is limited due to Coumadin.  He denies any hypoglycemia, lowest reading was 76.  He does still eat snacks at night sometimes.  He did not ever start the [place] due to cost of medication.) An ACE inhibitor/angiotensin II receptor blocker is contraindicated. He does not see  a podiatrist.Eye exam is current.  Hypertension This is a chronic problem. The current episode started more than 1 year ago. The problem has been resolved since onset. The problem is controlled. Pertinent negatives include no blurred vision. There are no associated agents to hypertension. Risk factors for coronary artery disease include diabetes mellitus, dyslipidemia, male gender and sedentary lifestyle. Past treatments include beta blockers and diuretics. The current treatment provides moderate improvement. There are no compliance problems.  Hypertensive end-organ damage includes kidney disease, CAD/MI, CVA and heart failure. Identifiable causes of hypertension include chronic renal disease and renovascular disease.  Hyperlipidemia This is a chronic problem. The current episode started more than 1 year ago. The problem is controlled. Recent lipid tests were reviewed and are normal. Exacerbating diseases include chronic renal disease, diabetes and nephrotic syndrome. Factors aggravating his hyperlipidemia include beta blockers and fatty foods. Current antihyperlipidemic treatment includes statins. There are no compliance problems.  Risk factors for coronary artery disease include diabetes mellitus, dyslipidemia, hypertension, male sex and a sedentary lifestyle.    Review of systems  Constitutional: + Minimally fluctuating body weight,  current Body mass index is 23.15 kg/m. , no fatigue, no subjective hyperthermia, no subjective hypothermia Eyes: no blurry vision, no xerophthalmia ENT: no sore throat, no nodules palpated in throat, no dysphagia/odynophagia, no hoarseness Cardiovascular: no chest pain, no shortness of breath, no palpitations, no leg swelling Respiratory: no cough, no shortness of breath Gastrointestinal: no nausea/vomiting/diarrhea Musculoskeletal: no muscle/joint aches Skin: no rashes, no hyperemia Neurological: no tremors, no numbness, no tingling, no dizziness Psychiatric: no  depression, no anxiety  Objective:     BP 130/60 (BP Location: Left Arm, Patient Position: Sitting, Cuff Size: Large)   Pulse 84   Ht 5\' 6"  (1.676 m)   Wt 143 lb 6.4 oz (65 kg)   BMI 23.15 kg/m   Wt Readings from Last 3 Encounters:  05/26/23 143 lb 6.4 oz (65 kg)  01/27/23 147 lb (66.7 kg)  09/25/22 144 lb 12.8 oz (65.7 kg)    BP Readings from Last 3 Encounters:  05/26/23 130/60  01/27/23 (!) 140/74  09/25/22 (!) 101/59     Physical Exam- Limited  Constitutional:  Body mass index is 23.15 kg/m. , not in acute distress, normal state of mind Eyes:  EOMI, no exophthalmos Musculoskeletal: no gross deformities, strength intact in all four extremities, no gross restriction of joint movements Skin:  no rashes, no hyperemia Neurological: no tremor with outstretched hands   Diabetic Foot Exam - Simple   No data filed     CMP ( most recent) CMP     Component Value Date/Time   NA 137 09/19/2022 0833   NA 135 (A) 06/11/2021 0000   K 5.1 09/19/2022 0833   CL 101 09/19/2022 0833   CO2 28 09/19/2022 0833   GLUCOSE 253 (H) 09/19/2022 0833   BUN 34 (H) 09/19/2022 0833   BUN 33 (A) 06/11/2021 0000   CREATININE 1.32 (H) 09/19/2022 0833   CALCIUM 9.7 09/19/2022 0833   PROT 5.9 (L) 09/19/2022 1478  ALBUMIN 4.1 12/06/2021 1031   AST 17 09/19/2022 0833   ALT 16 09/19/2022 0833   ALKPHOS 36 (L) 12/06/2021 1031   BILITOT 0.6 09/19/2022 0833   GFRNONAA 52 (L) 12/06/2021 1031   GFRAA 57 (L) 08/04/2019 1449     Diabetic Labs (most recent): Lab Results  Component Value Date   HGBA1C 7.6 (A) 05/26/2023   HGBA1C 8.1 (A) 01/27/2023   HGBA1C 7.3 09/02/2021     Lipid Panel ( most recent) Lipid Panel     Component Value Date/Time   CHOL 115 09/19/2022 0833   TRIG 239 (H) 09/19/2022 0833   HDL 28 (L) 09/19/2022 0833   CHOLHDL 4.1 09/19/2022 0833   VLDL 23 07/25/2018 0443   LDLCALC 57 09/19/2022 0833      Lab Results  Component Value Date   TSH 2.13 09/19/2022   TSH  0.997 06/16/2014   FREET4 1.5 09/19/2022           Assessment & Plan:   1) Type 2 Diabetes with CKD stage 3a  - Oscar [name] has currently uncontrolled symptomatic type 2 DM since 83 years of age.  He presents today with his meter and logs showing above target glycemic profile overall.  His POCT A1c today is 7.6%, improving from last visit of 8.1%.  He notes he has been working on his diet but is limited due to Coumadin.  He denies any hypoglycemia, lowest reading was 76.  He does still eat snacks at night sometimes.  He did not ever start the [place] due to cost of medication.  -Recent labs reviewed.  - I had a long discussion with him about the progressive nature of diabetes and the pathology behind its complications. -his diabetes is complicated by TIA, MI, CHF, CAD, CKD and he remains at a high risk for more acute and chronic complications which include retinopathy, and neuropathy. These are all discussed in detail with him.  - Nutritional counseling repeated at each appointment due to patients tendency to fall back in to old habits.  - The patient admits there is a room for improvement in their diet and drink choices. -  Suggestion is made for the patient to avoid simple carbohydrates from their diet including Cakes, Sweet Desserts / Pastries, Ice Cream, Soda (diet and regular), Sweet Tea, Candies, Chips, Cookies, Sweet Pastries, Store Bought Juices, Alcohol in Excess of 1-2 drinks a day, Artificial Sweeteners, Coffee Creamer, and "Sugar-free" Products. This will help patient to have stable blood glucose profile and potentially avoid unintended weight gain.   - I encouraged the patient to switch to unprocessed or minimally processed complex starch and increased protein intake (animal or plant source), fruits, and vegetables.   - Patient is advised to stick to a routine mealtimes to eat 3 meals a day and avoid unnecessary snacks (to snack only to correct hypoglycemia).  - I have  approached him with the following individualized plan to manage  his diabetes and patient agrees:   Avoiding hypoglycemia is the #1 priority in his care given his age and comorbidities.  An appropriate A1c goal for him would be 7.0-7.5%.  -he is encouraged to continue monitoring blood glucose twice daily, before breakfast and before bed, and to call the clinic if he has readings less than 70 or greater than 300 for 3 tests in a row.  -He is advised to continue Tradjenta 5 mg po daily and Glipizide 10 mg XL daily with breakfast.   He could not  afford the copay for SGLT2i and he seems to have managed pretty well without it recently.  - he is not a candidate for Metformin due to concurrent renal insufficiency.  He cannot tolerated GLP1 due to body habitus.  -If he cannot gain control of diabetes with Tradjenta and Glipizide along with diet and exercise, may need to consider adding basal insulin, which is something he would like to avoid if possible.  - Specific targets for  A1c;  LDL, HDL,  and Triglycerides were discussed with the patient.  2) Blood Pressure /Hypertension:  his blood pressure is controlled to target.   he is advised to continue his current medications as prescribed by cardiology/nephrology.  3) Lipids/Hyperlipidemia:    His most recent lipid panel from 09/19/22 shows controlled LDL of 57 and elevated triglycerides of 239.  He is advised to continue Pravastatin 40 mg po daily at bedtime, Fish Oil, Fenofibrate, and Zetia.  Side effects and precautions discussed with him.    4)  Weight/Diet:  his Body mass index is 23.15 kg/m.  - He is not a candidate for weight loss. Exercise, and detailed carbohydrates information provided  -  detailed on discharge instructions.  5) Chronic Care/Health Maintenance: -he is on Statin medications and is encouraged to initiate and continue to follow up with Ophthalmology, Dentist, Podiatrist at least yearly or according to recommendations, and  advised to stay away from smoking. I have recommended yearly flu vaccine and pneumonia vaccine at least every 5 years; moderate intensity exercise for up to 150 minutes weekly; and sleep for at least 7 hours a day.   - he is advised to maintain close follow up with Rebekah Chesterfield, NP for primary care needs, as well as his other providers for optimal and coordinated care.     I spent  20  minutes in the care of the patient today including review of labs from CMP, Lipids, Thyroid Function, Hematology (current and previous including abstractions from other facilities); face-to-face time discussing  his blood glucose readings/logs, discussing hypoglycemia and hyperglycemia episodes and symptoms, medications doses, his options of short and long term treatment based on the latest standards of care / guidelines;  discussion about incorporating lifestyle medicine;  and documenting the encounter. Risk reduction counseling performed per USPSTF guidelines to reduce obesity and cardiovascular risk factors.     Please refer to Patient Instructions for Blood Glucose Monitoring and Insulin/Medications Dosing Guide"  in media tab for additional information. Please  also refer to " Patient Self Inventory" in the Media  tab for reviewed elements of pertinent patient history.  Heron Nay participated in the discussions, expressed understanding, and voiced agreement with the above plans.  All questions were answered to his satisfaction. he is encouraged to contact clinic should he have any questions or concerns prior to his return visit.   Follow up plan: - Return in about 3 months (around 08/25/2023) for Diabetes F/U with A1c in office, No previsit labs, Bring meter and logs.  Ronny Bacon, [facility] [address] [place], [place] [address] Phone: [phone] Fax: [phone]  05/26/2023, 1:30 PM

## 2023-09-09 ENCOUNTER — Ambulatory Visit (INDEPENDENT_AMBULATORY_CARE_PROVIDER_SITE_OTHER): Admitting: Nurse Practitioner

## 2023-09-09 ENCOUNTER — Encounter: Payer: Self-pay | Admitting: Nurse Practitioner

## 2023-09-09 VITALS — BP 110/60 | HR 102 | Ht 66.0 in | Wt 142.8 lb

## 2023-09-09 DIAGNOSIS — I10 Essential (primary) hypertension: Secondary | ICD-10-CM | POA: Diagnosis not present

## 2023-09-09 DIAGNOSIS — E1122 Type 2 diabetes mellitus with diabetic chronic kidney disease: Secondary | ICD-10-CM

## 2023-09-09 DIAGNOSIS — N1831 Chronic kidney disease, stage 3a: Secondary | ICD-10-CM | POA: Diagnosis not present

## 2023-09-09 DIAGNOSIS — E785 Hyperlipidemia, unspecified: Secondary | ICD-10-CM | POA: Diagnosis not present

## 2023-09-09 DIAGNOSIS — Z7984 Long term (current) use of oral hypoglycemic drugs: Secondary | ICD-10-CM

## 2023-09-09 MED ORDER — LINAGLIPTIN 5 MG PO TABS: 5.0000 mg | ORAL_TABLET | Freq: Every day | ORAL | 3 refills | Status: DC

## 2023-09-09 NOTE — Progress Notes (Signed)
 Endocrinology Follow Up Note       09/09/2023, 1:38 PM   Subjective:    Patient ID: Oscar [name], male    DOB: [birth year].  Oscar [name] is being seen in follow up after being seen in consultation for management of currently uncontrolled symptomatic diabetes requested by  Renato Dorothey HERO, NP.   Past Medical History:  Diagnosis Date   Anemia    Atrial fibrillation (HCC)    permanent   CAD (coronary artery disease)    a. cath on 07/29/2018 showed 100% Proximal-RCA stenosis, 99% Proximal-LAD, 75% Ost LM, 70% LCx, 40% PDA, and 30% Ost LCx. Declined CABG   Cardiomyopathy    EF 35% in the past--Improved to 55% echo 2/10. Moderately severe mitral regurgitation in the past, now improved,   Carotid artery stenosis    CHF (congestive heart failure) (HCC) 2002   COPD (chronic obstructive pulmonary disease) (HCC)    Coronary artery disease    non obstructive   Diabetes mellitus    Dyslipidemia    History of tobacco abuse    Nephrolithiasis    Tremors of nervous system     Past Surgical History:  Procedure Laterality Date   CAROTID ENDARTERECTOMY Left 10-21-05   cea   CATARACT EXTRACTION W/PHACO Left 09/10/2018   Procedure: CATARACT EXTRACTION PHACO AND INTRAOCULAR LENS PLACEMENT LEFT EYE;  Surgeon: Harrie Agent, MD;  Location: [facility];  Service: Ophthalmology;  Laterality: Left;  left   CATARACT EXTRACTION W/PHACO Right 11/12/2018   Procedure: CATARACT EXTRACTION PHACO AND INTRAOCULAR LENS PLACEMENT RIGHT EYE;  Surgeon: Harrie Agent, MD;  Location: [facility];  Service: Ophthalmology;  Laterality: Right;  right   ENDARTERECTOMY Right 08/11/2018   Procedure: ENDARTERECTOMY CAROTID RIGHT;  Surgeon: Oris Krystal FALCON, MD;  Location: [facility] OR;  Service: Vascular;  Laterality: Right;   left carotid endarterectomy     PATCH ANGIOPLASTY Right 08/11/2018   Procedure: PATCH ANGIOPLAST USING HEMASHIELD PLATINUM FINESSE;  Surgeon:  Oris Krystal FALCON, MD;  Location: [facility] OR;  Service: Vascular;  Laterality: Right;   RIGHT/LEFT HEART CATH AND CORONARY ANGIOGRAPHY N/A 07/29/2018   Procedure: RIGHT/LEFT HEART CATH AND CORONARY ANGIOGRAPHY;  Surgeon: Cherrie Toribio SAUNDERS, MD;  Location: [facility];  Service: Cardiovascular;  Laterality: N/A;   TONSILLECTOMY      Social History   Socioeconomic History   Marital status: Married    Spouse name: Not on file   Number of children: 5   Years of education: 12   Highest education level: Not on file  Occupational History   Occupation: Retired  Tobacco Use   Smoking status: Former    Current packs/day: 0.00    Types: Cigarettes    Quit date: 02/25/2000    Years since quitting: 23.5   Smokeless tobacco: Former  Building services engineer status: Never Used  Substance and Sexual Activity   Alcohol use: No   Drug use: No   Sexual activity: Not on file  Other Topics Concern   Not on file  Social History Narrative   Lives at home with his wife.   Right-handed.   3-4 cups caffeine per day.   Social Drivers  of Health   Financial Resource Strain: Low Risk  (02/28/2023)   Received from [facility]   Overall Financial Resource Strain (CARDIA)    Difficulty of Paying Living Expenses: Not very hard  Food Insecurity: Food Insecurity Present (02/28/2023)   Received from [facility]   Hunger Vital Sign    Within the past 12 months, you worried that your food would run out before you got the money to buy more.: Sometimes true    Within the past 12 months, the food you bought just didn't last and you didn't have money to get more.: Sometimes true  Transportation Needs: No Transportation Needs (02/28/2023)   [facility] - Transportation    Lack of Transportation (Medical): No    Lack of Transportation (Non-Medical): No  Physical Activity: Insufficiently Active (02/28/2023)   Received from [facility]   Exercise Vital Sign    On average, how many days  per week do you engage in moderate to strenuous exercise (like a brisk walk)?: 1 day    On average, how many minutes do you engage in exercise at this level?: 10 min  Stress: No Stress Concern Present (02/28/2023)   Received from [facility] Occupational [facility] - Occupational Stress Questionnaire    Feeling of Stress : Not at all  Social Connections: Socially Integrated (02/28/2023)   Received from [facility]   Social Connection and Isolation Panel    In a typical week, how many times do you talk on the phone with family, friends, or neighbors?: More than three times a week    How often do you get together with friends or relatives?: More than three times a week    How often do you attend church or religious services?: More than 4 times per year    Do you belong to any clubs or organizations such as church groups, unions, fraternal or athletic groups, or school groups?: Yes    How often do you attend meetings of the clubs or organizations you belong to?: More than 4 times per year    Are you married, widowed, divorced, separated, never married, or living with a partner?: Married    Family History  Problem Relation Age of Onset   Coronary artery disease Mother    Diabetes Mother    Heart disease Mother        Before age 48 and  CHF   Hypertension Mother    Other Mother        varicose veins   Heart attack Mother    Heart disease Sister    Hypertension Sister    Heart attack Sister    Heart disease Brother    Hyperlipidemia Brother    Hypertension Brother    Other Brother        varicose veins   Heart attack Brother    Heart disease Son 30       Heart Disease before age 66- Open hear surgery   Deep vein thrombosis Son    Heart attack Son    Coronary artery disease Other    Diabetes Daughter    Hypertension Daughter    Other Daughter        varicose veins    Outpatient Encounter Medications as of 09/09/2023  Medication Sig   acetaminophen   (TYLENOL ) 325 MG tablet Take 2 tablets (650 mg total) by mouth every 6 (six) hours as needed for  mild pain (or Fever >/= 101).   albuterol  (PROAIR  HFA) 108 (90 Base) MCG/ACT inhaler Inhale 2 puffs into the lungs every 6 (six) hours as needed for wheezing or shortness of breath.   aspirin  EC 81 MG EC tablet Take 1 tablet (81 mg total) by mouth Oscar.   carvedilol  (COREG ) 12.5 MG tablet TAKE 1 TABLET TWICE A DAY WITH MEALS (PLEASE SCHEDULE AN APPOINTMENT FOR FURTHER REFILLS)   Cholecalciferol (VITAMIN D3) 10 MCG (400 UNIT) CAPS Take 400 Units by mouth Oscar.   digoxin  (LANOXIN ) 0.125 MG tablet Take 1 tablet (125 mcg total) by mouth Oscar.   ENTRESTO  24-26 MG TAKE 1 TABLET TWICE A DAY   ezetimibe  (ZETIA ) 10 MG tablet Take 10 mg by mouth Oscar.   fenofibrate  160 MG tablet Take 160 mg by mouth Oscar.   furosemide  (LASIX ) 40 MG tablet TAKE 1 TABLET Oscar (MAINTENANCE DOSE IS NOW 40 MG Oscar)   GARLIC PO Take by mouth.   glipiZIDE  (GLUCOTROL  XL) 10 MG 24 hr tablet TAKE 1 TABLET Oscar WITH BREAKFAST   ipratropium (ATROVENT) 0.02 % nebulizer solution Take 2.5 mLs (0.5 mg dose) by nebulization 2 (two) times Oscar.   Multiple Vitamin (MULTIVITAMIN) tablet Take 1 tablet by mouth Oscar.   nitroGLYCERIN  (NITROSTAT ) 0.4 MG SL tablet Place 1 tablet (0.4 mg total) under the tongue every 5 (five) minutes as needed for chest pain.   Omega-3 Fatty Acids (FISH OIL) 1200 MG CAPS Take 1,000 mg by mouth Oscar.    ONETOUCH VERIO test strip    OVER THE COUNTER MEDICATION Over the Counter Beet Chews - patient takes 1 Oscar in the morning   pantoprazole  (PROTONIX ) 40 MG tablet Take 1 tablet (40 mg total) by mouth Oscar.   pravastatin  (PRAVACHOL ) 40 MG tablet Take 80 mg by mouth Oscar.    UNABLE TO FIND Beet root   warfarin (COUMADIN ) 7.5 MG tablet Take 1 tablet (7.5 mg total) by mouth Oscar.   [DISCONTINUED] TRADJENTA  5 MG TABS tablet TAKE 1 TABLET Oscar   linagliptin  (TRADJENTA ) 5 MG TABS tablet Take 1 tablet (5 mg  total) by mouth Oscar.   No facility-administered encounter medications on file as of 09/09/2023.    ALLERGIES: Allergies  Allergen Reactions   Penicillins Hives, Other (See Comments) and Rash    Blisters on hands Blisters on hands  Blisters on hands Blisters on hands Blisters on hands Blisters on hands   Other Other (See Comments)    Joint and muscle pain and low energy. Joint and muscle pain and low energy.   Statins Other (See Comments)    Joint and muscle pain and low energy.   Penicillin G Sodium     Other reaction(s): hives   Spironolactone      Hyperkalemia Hyperkalemia Hyperkalemia   Statins Support [Acid Blockers Support]     Other reaction(s): weakness    VACCINATION STATUS:  There is no immunization history on file for this patient.  Diabetes He presents for his follow-up diabetic visit. He has type 2 diabetes mellitus. Onset time: Diagnosed at approx age of 47. His disease course has been stable. There are no hypoglycemic associated symptoms. Pertinent negatives for diabetes include no blurred vision, no fatigue and no foot paresthesias. There are no hypoglycemic complications. Symptoms are stable. Diabetic complications include a CVA, heart disease and nephropathy. Risk factors for coronary artery disease include diabetes mellitus, dyslipidemia, hypertension, male sex and sedentary lifestyle. Current diabetic treatment includes oral agent (dual therapy). He is compliant  with treatment most of the time. His weight is fluctuating minimally. He is following a generally healthy diet. When asked about meal planning, he reported none. He has not had a previous visit with a dietitian. He rarely participates in exercise. His home blood glucose trend is decreasing steadily. His breakfast blood glucose range is generally 90-110 mg/dl. His bedtime blood glucose range is generally 180-200 mg/dl. (He presents today with his meter and logs showing at target glycemic profile overall.   His most recent A1c on 5/19 was 8.1%, increasing from last visit of 7.6%.  He denies any hypoglycemia, lowest reading was 78.  He does still eat snacks at night sometimes.  ) An ACE inhibitor/angiotensin II receptor blocker is contraindicated. He does not see a podiatrist.Eye exam is current.  Hypertension This is a chronic problem. The current episode started more than 1 year ago. The problem has been resolved since onset. The problem is controlled. Pertinent negatives include no blurred vision. There are no associated agents to hypertension. Risk factors for coronary artery disease include diabetes mellitus, dyslipidemia, male gender and sedentary lifestyle. Past treatments include beta blockers and diuretics. The current treatment provides moderate improvement. There are no compliance problems.  Hypertensive end-organ damage includes kidney disease, CAD/MI, CVA and heart failure. Identifiable causes of hypertension include chronic renal disease and renovascular disease.  Hyperlipidemia This is a chronic problem. The current episode started more than 1 year ago. The problem is controlled. Recent lipid tests were reviewed and are normal. Exacerbating diseases include chronic renal disease, diabetes and nephrotic syndrome. Factors aggravating his hyperlipidemia include beta blockers and fatty foods. Current antihyperlipidemic treatment includes statins. There are no compliance problems.  Risk factors for coronary artery disease include diabetes mellitus, dyslipidemia, hypertension, male sex and a sedentary lifestyle.    Review of systems  Constitutional: + Minimally fluctuating body weight,  current Body mass index is 23.05 kg/m. , no fatigue, no subjective hyperthermia, no subjective hypothermia Eyes: no blurry vision, no xerophthalmia ENT: no sore throat, no nodules palpated in throat, no dysphagia/odynophagia, no hoarseness Cardiovascular: no chest pain, no shortness of breath, no palpitations, no  leg swelling Respiratory: no cough, no shortness of breath Gastrointestinal: no nausea/vomiting/diarrhea Musculoskeletal: no muscle/joint aches Skin: no rashes, no hyperemia Neurological: no tremors, no numbness, no tingling, no dizziness Psychiatric: no depression, no anxiety  Objective:     BP 110/60 (BP Location: Left Arm, Patient Position: Sitting, Cuff Size: Large)   Pulse (!) 102   Ht 5' 6 (1.676 m)   Wt 142 lb 12.8 oz (64.8 kg)   BMI 23.05 kg/m   Wt Readings from Last 3 Encounters:  09/09/23 142 lb 12.8 oz (64.8 kg)  05/26/23 143 lb 6.4 oz (65 kg)  01/27/23 147 lb (66.7 kg)    BP Readings from Last 3 Encounters:  09/09/23 110/60  05/26/23 130/60  01/27/23 (!) 140/74     Physical Exam- Limited  Constitutional:  Body mass index is 23.05 kg/m. , not in acute distress, normal state of mind Eyes:  EOMI, no exophthalmos Musculoskeletal: no gross deformities, strength intact in all four extremities, no gross restriction of joint movements Skin:  no rashes, no hyperemia Neurological: no tremor with outstretched hands   Diabetic Foot Exam - Simple   Simple Foot Form Diabetic Foot exam was performed with the following findings: Yes 09/09/2023  1:33 PM  Visual Inspection See comments: Yes Sensation Testing Intact to touch and monofilament testing bilaterally: Yes Pulse Check Posterior Tibialis and  Dorsalis pulse intact bilaterally: Yes Comments Onychomycosis bilaterally;  calluses bilaterally     CMP ( most recent) CMP     Component Value Date/Time   NA 137 09/19/2022 0833   NA 135 (A) 06/11/2021 0000   K 5.1 09/19/2022 0833   CL 101 09/19/2022 0833   CO2 28 09/19/2022 0833   GLUCOSE 253 (H) 09/19/2022 0833   BUN 34 (H) 09/19/2022 0833   BUN 33 (A) 06/11/2021 0000   CREATININE 1.32 (H) 09/19/2022 0833   CALCIUM  9.7 09/19/2022 0833   PROT 5.9 (L) 09/19/2022 0833   ALBUMIN 4.1 12/06/2021 1031   AST 17 09/19/2022 0833   ALT 16 09/19/2022 0833   ALKPHOS  36 (L) 12/06/2021 1031   BILITOT 0.6 09/19/2022 0833   GFRNONAA 52 (L) 12/06/2021 1031   GFRAA 57 (L) 08/04/2019 1449     Diabetic Labs (most recent): Lab Results  Component Value Date   HGBA1C 7.6 (A) 05/26/2023   HGBA1C 8.1 (A) 01/27/2023   HGBA1C 7.3 09/02/2021     Lipid Panel ( most recent) Lipid Panel     Component Value Date/Time   CHOL 115 09/19/2022 0833   TRIG 239 (H) 09/19/2022 0833   HDL 28 (L) 09/19/2022 0833   CHOLHDL 4.1 09/19/2022 0833   VLDL 23 07/25/2018 0443   LDLCALC 57 09/19/2022 0833      Lab Results  Component Value Date   TSH 2.13 09/19/2022   TSH 0.997 06/16/2014   FREET4 1.5 09/19/2022           Assessment & Plan:   1) Type 2 Diabetes with CKD stage 3a  - Oscar [name] has currently uncontrolled symptomatic type 2 DM since 83 years of age.  He presents today with his meter and logs showing at target glycemic profile overall.  His most recent A1c on 5/19 was 8.1%, increasing from last visit of 7.6%.  He denies any hypoglycemia, lowest reading was 78.  He does still eat snacks at night sometimes.   -Recent labs reviewed.  - I had a long discussion with him about the progressive nature of diabetes and the pathology behind its complications. -his diabetes is complicated by TIA, MI, CHF, CAD, CKD and he remains at a high risk for more acute and chronic complications which include retinopathy, and neuropathy. These are all discussed in detail with him.  - Nutritional counseling repeated at each appointment due to patients tendency to fall back in to old habits.  - The patient admits there is a room for improvement in their diet and drink choices. -  Suggestion is made for the patient to avoid simple carbohydrates from their diet including Cakes, Sweet Desserts / Pastries, Ice Cream, Soda (diet and regular), Sweet Tea, Candies, Chips, Cookies, Sweet Pastries, Store Bought Juices, Alcohol in Excess of 1-2 drinks a day, Artificial Sweeteners,  Coffee Creamer, and Sugar-free Products. This will help patient to have stable blood glucose profile and potentially avoid unintended weight gain.   - I encouraged the patient to switch to unprocessed or minimally processed complex starch and increased protein intake (animal or plant source), fruits, and vegetables.   - Patient is advised to stick to a routine mealtimes to eat 3 meals a day and avoid unnecessary snacks (to snack only to correct hypoglycemia).  - I have approached him with the following individualized plan to manage  his diabetes and patient agrees:   Avoiding hypoglycemia is the #1 priority in his care given his  age and comorbidities.  An appropriate A1c goal for him would be 7.0-7.5%.  -he is encouraged to continue monitoring blood glucose twice Oscar, before breakfast and before bed, and to call the clinic if he has readings less than 70 or greater than 300 for 3 tests in a row.  -He is advised to continue Tradjenta  5 mg po Oscar and Glipizide  10 mg XL Oscar with breakfast.  He is aware that if he has a low glucose to stop the Glipizide  and call me.  He could not afford the copay for SGLT2i and he seems to have managed pretty well without it recently.  - he is not a candidate for Metformin due to concurrent renal insufficiency.  He cannot tolerated GLP1 due to body habitus.  -If he cannot gain control of diabetes with Tradjenta  and Glipizide  along with diet and exercise, may need to consider adding basal insulin , which is something he would like to avoid if possible.  - Specific targets for  A1c;  LDL, HDL,  and Triglycerides were discussed with the patient.  2) Blood Pressure /Hypertension:  his blood pressure is controlled to target.   he is advised to continue his current medications as prescribed by cardiology/nephrology.  3) Lipids/Hyperlipidemia:    His most recent lipid panel from 07/14/23 shows controlled LDL of 58 and elevated triglycerides of 164 (improving).   He is advised to continue Pravastatin  40 mg po Oscar at bedtime, Fish Oil, Fenofibrate , and Zetia .  Side effects and precautions discussed with him.    4)  Weight/Diet:  his Body mass index is 23.05 kg/m.  - He is not a candidate for weight loss. Exercise, and detailed carbohydrates information provided  -  detailed on discharge instructions.  5) Chronic Care/Health Maintenance: -he is on Statin medications and is encouraged to initiate and continue to follow up with Ophthalmology, Dentist, Podiatrist at least yearly or according to recommendations, and advised to stay away from smoking. I have recommended yearly flu vaccine and pneumonia vaccine at least every 5 years; moderate intensity exercise for up to 150 minutes weekly; and sleep for at least 7 hours a day.   - he is advised to maintain close follow up with Renato Dorothey HERO, NP for primary care needs, as well as his other providers for optimal and coordinated care.     I spent  22  minutes in the care of the patient today including review of labs from CMP, Lipids, Thyroid Function, Hematology (current and previous including abstractions from other facilities); face-to-face time discussing  his blood glucose readings/logs, discussing hypoglycemia and hyperglycemia episodes and symptoms, medications doses, his options of short and long term treatment based on the latest standards of care / guidelines;  discussion about incorporating lifestyle medicine;  and documenting the encounter. Risk reduction counseling performed per USPSTF guidelines to reduce obesity and cardiovascular risk factors.     Please refer to Patient Instructions for Blood Glucose Monitoring and Insulin /Medications Dosing Guide  in media tab for additional information. Please  also refer to  Patient Self Inventory in the Media  tab for reviewed elements of pertinent patient history.  Oscar [name] participated in the discussions, expressed understanding, and voiced  agreement with the above plans.  All questions were answered to his satisfaction. he is encouraged to contact clinic should he have any questions or concerns prior to his return visit.   Follow up plan: - Return in about 3 months (around 12/10/2023) for Diabetes F/U with A1c  in office, No previsit labs, Bring meter and logs.  Oscar [name], [facility] [address] [place], [place] [address] Phone: [phone] Fax: [phone]  09/09/2023, 1:38 PM

## 2023-09-29 ENCOUNTER — Other Ambulatory Visit: Payer: Self-pay | Admitting: Cardiology

## 2023-10-22 NOTE — Progress Notes (Signed)
 Cardiology Office Note    Date:  10/27/2023  ID:  Oscar [name], DOB [birth year], MRN [id number] Cardiologist: Lynwood Schilling, MD Cardiology APP:  Johnson Laymon HERO, PA-C { :  History of Present Illness:    Oscar [name] is a 83 y.o. male with past medical history of chronic HFrEF (EF 25-30% by echo in 07/2018, at 35-40% by repeat echo in 11/2018), CAD (catheterization in 07/2018 showing 100% Proximal-RCA stenosis, 99% Proximal-LAD, 75% Ost LM, 70% LCx, 40% PDA, and 30% Ost LCx with patient declining CABG along with declining arthrectomy with Impella support due to high-risk status), permanent atrial fibrillation, carotid artery stenosis (s/p L CEA in 2007 and R CEA in 07/2018), HTN, HLD and Stage 3 CKD who presents to the office today for annual follow-up.   He was last examined by Dr. Schilling in 03/2022 and was overall doing well at that time and denied any recent anginal symptoms. BP had previously limited titration of medical therapy and he did have hyperkalemia with Spironolactone . Endocrinology had also previously discontinued his SGLT2 inhibitor.  He was continued on Coumadin , ASA 81 mg daily, Coreg  12.5 mg twice daily, Digoxin  125 mcg daily, Zetia  10 mg daily, Lasix  40 mg daily, Pravastatin  80 mg daily and Entresto  24-26 mg twice daily.   The office did receive a cardiac clearance request in 02/2023 for clearance in regards to upcoming left axillary lymph node biopsy. He had recently been hospitalized for pneumonia, atrial fibrillation with RVR and acute CHF exacerbation. Repeat echocardiogram at that time did show his EF had improved to 45 to 50%. CT imaging during admission did show widespread mediastinal and bilateral axilla adenopathy which was concerning for lymphoma. His biopsy was overall felt to be urgent and he was high-risk given his significant past medical history. Holding Coumadin  was deferred to his PCP.  In the interim, he did present to [facility] ED on 10/23/2023 for  evaluation of dark tarry stools. Hemoglobin was at 12.1 but WBC was elevated at 33.9 and follow-up chest/abdominal CT showed no acute abnormalities. Was noted to have pathologic bilateral axillary, mediastinal, bilateral hilar and retrocrural adenopathy. His case was reviewed with Oncology and there was concern for ALL and transfer to [facility] was recommended. By review of notes from Care Everywhere, his pathology was examined and showed no evidence of blasts and was most consistent with mature cells. Final pathology report was pending but appeared most consistent with CLL. Was recommended to follow-up with Oncology as an outpatient with lymph node biopsy. Was recommended to hold ASA and Coumadin  at discharge.  In talking the patient today, he reports overall doing well since returning home a few days ago. Says that he has still been having some darkness to his stools and they have not fully normalized in color. Still holding ASA and Coumadin  at this time. Does have follow-up with Oncology next week. He remains active at home and enjoys doing yard work and has been recently push mowing and using his weed-eater. Also walks at [facility] a few days a week for exercise. He denies any recent chest pain or dyspnea on exertion. No specific orthopnea, PND or pitting edema.  Studies Reviewed:   EKG: EKG is ordered today and demonstrates:   EKG Interpretation Date/Time:  Tuesday October 27 2023 12:51:26 EDT Ventricular Rate:  65 PR Interval:    QRS Duration:  110 QT Interval:  404 QTC Calculation: 420 R Axis:   106  Text Interpretation: Atrial flutter with variable A-V  block Rightward axis Confirmed by Johnson Grate ([id number]) on 10/27/2023 1:38:44 PM       R/LHC: 07/2018 Prox RCA lesion is 100% stenosed. Prox LAD lesion is 99% stenosed. Ost LM lesion is 75% stenosed. Prox Cx to Mid Cx lesion is 70% stenosed. LPDA lesion is 40% stenosed. Ost Cx to Prox Cx lesion is 30% stenosed.   Findings:   Ao =  148/81 (109)  LV = 13215 RA = 7 RV = 59/6 PA = 59/24 (37) PCW = 22 Fick cardiac output/index = 4.1/2.3 PVR = 3.3 WU Ao sat = 93% PA sat = 62%, 64%   Assessment: 1. Severe 3v CAD in left dominant system with 70-80% LM lesion and 99% proximal LAD lesion 2. Ischemic CM EF 25% 3. Mild to moderate pulmonary HTN 4. Moderately reduced CO   Plan/Discussion:   Will d/w with TCTS regarding possible CABG and Maze. If not surgical candidate will need PCI of LAD with atherectomy +/- LM PCI.    I discussed with his wife by phone as well.     Echocardiogram: 11/2018 IMPRESSIONS     1. Left ventricular ejection fraction, by visual estimation, is 35 to  40%. The left ventricle has normal function. Mildly increased left  ventricular size. There is mildly increased left ventricular hypertrophy.   2. Septal apical and inferior wall hypokinesis.   3. Global right ventricle has normal systolic function.The right  ventricular size is normal. No increase in right ventricular wall  thickness.   4. Left atrial size was moderately dilated.   5. Right atrial size was moderately dilated.   6. Moderate calcification of the mitral valve leaflet(s).   7. Moderate mitral annular calcification.   8. Moderate thickening of the mitral valve leaflet(s).   9. The mitral valve is normal in structure. Mild mitral valve  regurgitation. No evidence of mitral stenosis.  10. The tricuspid valve is normal in structure. Tricuspid valve  regurgitation is mild.  11. The aortic valve is tricuspid Aortic valve regurgitation is mild by  color flow Doppler. Mild aortic valve sclerosis without stenosis.  12. The pulmonic valve was normal in structure. Pulmonic valve  regurgitation is mild by color flow Doppler.  13. Moderately elevated pulmonary artery systolic pressure.  14. The inferior vena cava is normal in size with greater than 50%  respiratory variability, suggesting right atrial pressure of 3 mmHg.       Echocardiogram: 02/2023 Summary   1. The left ventricle is normal in size with normal wall thickness.    2. The left ventricular systolic function is mildly decreased, LVEF is  visually estimated at 45-50%.    3. There is decreased contraction of the mid anteroseptal and apical septal  segments.   4. The mitral valve leaflets are mildly thickened with normal leaflet  mobility.   5. Mitral annular calcification is present.    6. There is mild mitral valve regurgitation.    7. There is mild to moderate aortic regurgitation.    8. There is mild aortic valve stenosis.    9. The left atrium is severely dilated in size.    10. The right ventricle is mildly dilated in size, with low normal systolic  function.   11. There is mild to moderate tricuspid regurgitation.    12. There is moderate pulmonary hypertension.    13. There is mild to moderate pulmonic regurgitation.    14. The right atrium is mildly dilated in size.    15.  The aorta at the ascending aorta is mildly dilated.    16. IVC size and inspiratory change suggest mildly elevated right atrial  pressure. (5-10 mmHg).     Risk Assessment/Calculations:    CHA2DS2-VASc Score = 5  This indicates a 7.2% annual risk of stroke. The patient's score is based upon: CHF History: 1 HTN History: 1 Diabetes History: 0 Stroke History: 0 Vascular Disease History: 1 Age Score: 2 Gender Score: 0  Vitals:   10/27/23 1248 10/27/23 1323  BP: (!) 144/62 (!) 140/66    The patient's blood pressure is elevated above target today. In order to address the patient's elevated BP: Blood pressure will be monitored at home to determine if medication changes need to be made.    Physical Exam:   VS:  BP (!) 140/66   Pulse 65   Ht 5' 6 (1.676 m)   Wt 143 lb (64.9 kg)   BMI 23.08 kg/m    Wt Readings from Last 3 Encounters:  10/27/23 143 lb (64.9 kg)  10/23/23 140 lb (63.5 kg)  09/09/23 142 lb 12.8 oz (64.8 kg)     GEN: Pleasant, elderly  male appearing in no acute distress NECK: No JVD; No carotid bruits CARDIAC: Irregularly irregular, 2/6 diastolic murmur along sternal border.  RESPIRATORY:  Clear to auscultation without rales, wheezing or rhonchi  ABDOMEN: Appears non-distended. No obvious abdominal masses. EXTREMITIES: No clubbing or cyanosis. No pitting edema.  Distal pedal pulses are 2+ bilaterally.   Assessment and Plan:   1. Heart failure with improved ejection fraction (HFimpEF) (HCC) - His EF was previously 25 to 30% by echocardiogram in 07/2018 and had improved to 45 to 50% by echocardiogram in 02/2023. - He appears euvolemic by examination today and reports his breathing has been stable. - Continue Coreg  12.5 mg twice daily, Digoxin  0.125 mg daily, Entresto  24-26 mg twice daily and Lasix  40 mg daily. Previously on SGLT2i therapy and stopped by Endocrinology.   2. Coronary artery disease involving native coronary artery of native heart without angina pectoris - Prior cardiac catheterization in 07/2018 showed multivessel CAD and he declined CABG at that time and declined atherectomy with Impella support due to the procedure being high-risk.  - He remains active at baseline and denies any recent anginal symptoms. Continue Coreg  12.5 mg twice daily, Zetia  10 mg daily and Pravastatin  80 mg daily. ASA currently held.   3. Permanent atrial fibrillation (HCC) - He denies any recent palpitations and heart rate is well-controlled in the 60's during today's visit. Continue Coreg  12.5 mg twice daily and Digoxin  0.125 mg daily. Dig level was <0.6 when checked on 10/24/2023. - He was previously on Coumadin  for anticoagulation but this has been held in the setting of workup for CLL and in the setting of melena. He does have follow-up with Oncology next week and plans to review with them at that time in regards to restarting this. We did review that he is at increased risk of a thromboembolic event during this timeframe and he is  aware of that.  4. Bilateral carotid artery disease, unspecified type (HCC) - He previously underwent L CEA in 2007 and R CEA in 07/2018. Remains on Zetia  10 mg daily, Fenofibrate  160 mg daily and Pravastatin  80 mg daily. ASA has been held as outlined above.  5. Stage 3b chronic kidney disease (HCC) - Followed by Dr. Rachele. Creatinine had improved to 1.01 when checked most recently on 10/25/2023.  6. Leukocytosis and Lymphadenopathy concerning for  CLL. - He is scheduled to establish with Oncology here in [place] next week. ASA and Coumadin  are currently on hold. He wishes to continue holding Coumadin  until he follows up with Oncology next week which I think is very reasonable given that he is still having some episodes of melena . If melena fully resolves, would plan to restart Coumadin  initially given his atrial fibrillation. Hold off on restarting ASA for now.   Signed, Laymon CHRISTELLA Qua, PA-C

## 2023-10-23 ENCOUNTER — Emergency Department (HOSPITAL_COMMUNITY)

## 2023-10-23 ENCOUNTER — Observation Stay (HOSPITAL_COMMUNITY)
Admission: EM | Admit: 2023-10-23 | Discharge: 2023-10-24 | Disposition: A | Discharge status: Other Acute Inpatient Hospital | Attending: Internal Medicine | Admitting: Internal Medicine

## 2023-10-23 ENCOUNTER — Other Ambulatory Visit: Payer: Self-pay

## 2023-10-23 ENCOUNTER — Encounter (HOSPITAL_COMMUNITY): Payer: Self-pay | Admitting: *Deleted

## 2023-10-23 DIAGNOSIS — I5022 Chronic systolic (congestive) heart failure: Secondary | ICD-10-CM | POA: Diagnosis not present

## 2023-10-23 DIAGNOSIS — Z7982 Long term (current) use of aspirin: Secondary | ICD-10-CM | POA: Diagnosis not present

## 2023-10-23 DIAGNOSIS — K922 Gastrointestinal hemorrhage, unspecified: Principal | ICD-10-CM | POA: Diagnosis present

## 2023-10-23 DIAGNOSIS — K921 Melena: Principal | ICD-10-CM

## 2023-10-23 DIAGNOSIS — K8020 Calculus of gallbladder without cholecystitis without obstruction: Secondary | ICD-10-CM | POA: Insufficient documentation

## 2023-10-23 DIAGNOSIS — I4891 Unspecified atrial fibrillation: Secondary | ICD-10-CM | POA: Diagnosis not present

## 2023-10-23 DIAGNOSIS — E11649 Type 2 diabetes mellitus with hypoglycemia without coma: Secondary | ICD-10-CM | POA: Insufficient documentation

## 2023-10-23 DIAGNOSIS — E162 Hypoglycemia, unspecified: Secondary | ICD-10-CM | POA: Insufficient documentation

## 2023-10-23 DIAGNOSIS — R591 Generalized enlarged lymph nodes: Secondary | ICD-10-CM | POA: Diagnosis not present

## 2023-10-23 DIAGNOSIS — J439 Emphysema, unspecified: Secondary | ICD-10-CM | POA: Diagnosis not present

## 2023-10-23 DIAGNOSIS — Z8572 Personal history of non-Hodgkin lymphomas: Secondary | ICD-10-CM | POA: Diagnosis not present

## 2023-10-23 DIAGNOSIS — Z79899 Other long term (current) drug therapy: Secondary | ICD-10-CM | POA: Insufficient documentation

## 2023-10-23 DIAGNOSIS — Z87891 Personal history of nicotine dependence: Secondary | ICD-10-CM | POA: Diagnosis not present

## 2023-10-23 DIAGNOSIS — I2510 Atherosclerotic heart disease of native coronary artery without angina pectoris: Secondary | ICD-10-CM | POA: Diagnosis not present

## 2023-10-23 DIAGNOSIS — I110 Hypertensive heart disease with heart failure: Secondary | ICD-10-CM | POA: Insufficient documentation

## 2023-10-23 DIAGNOSIS — D72820 Lymphocytosis (symptomatic): Secondary | ICD-10-CM | POA: Diagnosis not present

## 2023-10-23 DIAGNOSIS — Z7901 Long term (current) use of anticoagulants: Secondary | ICD-10-CM | POA: Insufficient documentation

## 2023-10-23 DIAGNOSIS — D72829 Elevated white blood cell count, unspecified: Secondary | ICD-10-CM

## 2023-10-23 DIAGNOSIS — Z7984 Long term (current) use of oral hypoglycemic drugs: Secondary | ICD-10-CM | POA: Insufficient documentation

## 2023-10-23 DIAGNOSIS — E119 Type 2 diabetes mellitus without complications: Secondary | ICD-10-CM | POA: Insufficient documentation

## 2023-10-23 DIAGNOSIS — E785 Hyperlipidemia, unspecified: Secondary | ICD-10-CM | POA: Insufficient documentation

## 2023-10-23 DIAGNOSIS — C9500 Acute leukemia of unspecified cell type not having achieved remission: Secondary | ICD-10-CM

## 2023-10-23 DIAGNOSIS — J449 Chronic obstructive pulmonary disease, unspecified: Secondary | ICD-10-CM | POA: Insufficient documentation

## 2023-10-23 LAB — CBC WITH DIFFERENTIAL/PLATELET
Abs Immature Granulocytes: 0.5 K/uL — ABNORMAL HIGH (ref 0.00–0.07)
Basophils Absolute: 0 K/uL (ref 0.0–0.1)
Basophils Relative: 0 %
Blasts: 16 %
Eosinophils Absolute: 0 K/uL (ref 0.0–0.5)
Eosinophils Relative: 0 %
HCT: 38.1 % — ABNORMAL LOW (ref 39.0–52.0)
Hemoglobin: 12.1 g/dL — ABNORMAL LOW (ref 13.0–17.0)
Lymphocytes Relative: 45 %
Lymphs Abs: 11.9 K/uL — ABNORMAL HIGH (ref 0.7–4.0)
MCH: 28.2 pg (ref 26.0–34.0)
MCHC: 31.8 g/dL (ref 30.0–36.0)
MCV: 88.8 fL (ref 80.0–100.0)
Monocytes Absolute: 1.1 K/uL — ABNORMAL HIGH (ref 0.1–1.0)
Monocytes Relative: 4 %
Neutro Abs: 8.7 K/uL — ABNORMAL HIGH (ref 1.7–7.7)
Neutrophils Relative %: 33 %
Platelets: 365 K/uL (ref 150–400)
Promyelocytes Relative: 2 %
RBC: 4.29 MIL/uL (ref 4.22–5.81)
RDW: 15.9 % — ABNORMAL HIGH (ref 11.5–15.5)
Smear Review: NORMAL
WBC: 26.5 K/uL — ABNORMAL HIGH (ref 4.0–10.5)
nRBC: 0 % (ref 0.0–0.2)

## 2023-10-23 LAB — CBG MONITORING, ED
Glucose-Capillary: 60 mg/dL — ABNORMAL LOW (ref 70–99)
Glucose-Capillary: 163 mg/dL — ABNORMAL HIGH (ref 70–99)

## 2023-10-23 LAB — BASIC METABOLIC PANEL WITH GFR
Anion gap: 13 (ref 5–15)
BUN: 22 mg/dL (ref 8–23)
CO2: 23 mmol/L (ref 22–32)
Calcium: 9.2 mg/dL (ref 8.9–10.3)
Chloride: 99 mmol/L (ref 98–111)
Creatinine, Ser: 1.09 mg/dL (ref 0.61–1.24)
GFR, Estimated: >60 mL/min (ref >60)
Glucose, Bld: 131 mg/dL — ABNORMAL HIGH (ref 70–99)
Potassium: 4.3 mmol/L (ref 3.5–5.1)
Sodium: 135 mmol/L (ref 135–145)

## 2023-10-23 LAB — CBC
HCT: 37.7 % — ABNORMAL LOW (ref 39.0–52.0)
Hemoglobin: 12.2 g/dL — ABNORMAL LOW (ref 13.0–17.0)
MCH: 28.6 pg (ref 26.0–34.0)
MCHC: 32.4 g/dL (ref 30.0–36.0)
MCV: 88.5 fL (ref 80.0–100.0)
Platelets: 364 K/uL (ref 150–400)
RBC: 4.26 MIL/uL (ref 4.22–5.81)
RDW: 15.9 % — ABNORMAL HIGH (ref 11.5–15.5)
WBC: 34 K/uL — ABNORMAL HIGH (ref 4.0–10.5)
nRBC: 0 % (ref 0.0–0.2)

## 2023-10-23 LAB — PROTIME-INR
INR: 1 (ref 0.8–1.2)
Prothrombin Time: 14.2 s (ref 11.4–15.2)

## 2023-10-23 LAB — TYPE AND SCREEN
ABO/RH(D): A POS
Antibody Screen: NEGATIVE

## 2023-10-23 MED ORDER — SODIUM CHLORIDE 0.9 % IV SOLN
2.0000 g | Freq: Once | INTRAVENOUS | Status: AC
  Administered 2023-10-23: 2 g via INTRAVENOUS
  Filled 2023-10-23: qty 12.5

## 2023-10-23 MED ORDER — IOHEXOL 300 MG/ML  SOLN
100.0000 mL | Freq: Once | INTRAMUSCULAR | Status: AC | PRN
  Administered 2023-10-23: 100 mL via INTRAVENOUS

## 2023-10-23 MED ORDER — SODIUM CHLORIDE 0.9 % IV SOLN
INTRAVENOUS | Status: DC
  Administered 2023-10-23: 1000 mL via INTRAVENOUS

## 2023-10-23 MED ORDER — DEXTROSE 50 % IV SOLN
50.0000 mL | Freq: Once | INTRAVENOUS | Status: AC
  Administered 2023-10-23: 50 mL via INTRAVENOUS
  Filled 2023-10-23: qty 50

## 2023-10-23 MED ORDER — METRONIDAZOLE 500 MG/100ML IV SOLN
500.0000 mg | Freq: Once | INTRAVENOUS | Status: AC
  Administered 2023-10-23: 500 mg via INTRAVENOUS
  Filled 2023-10-23: qty 100

## 2023-10-23 NOTE — ED Triage Notes (Signed)
 Pt states with black stools for past 2 weeks, recent labs noted Hbg of 7.7.  pt with no c/o SOB.

## 2023-10-23 NOTE — ED Notes (Signed)
 Pt ambulated to restroom.

## 2023-10-23 NOTE — Progress Notes (Signed)
 Pt being followed by [facility] for Sepsis protocol.

## 2023-10-23 NOTE — ED Provider Notes (Signed)
 [place] EMERGENCY DEPARTMENT AT [facility] Provider Note   CSN: [id number] Arrival date & time: 10/23/23  1525     Patient presents with: GI Bleeding   Oscar [name] is a 83 y.o. male.   HPI     83 year old patient comes in with chief complaint of dark tarry stools for the last 2 weeks, low hemoglobin.  83 y.o. male with hx of Atrial fibrillation on AC with Warfarin, Heart failure with reduced ejection fraction, hypertension, diabetes type 2, hyperlipidemia.  Dark stools x 2 weeks, 2-3 episodes a day. Saw pcp, told Hb was < 8. No n/v. Stopped coumadin  himself 1 weeks ago himself.  Also has hx of lymphoma concerns, pt hasn't had follow up about it.   Prior to Admission medications   Medication Sig Start Date End Date Taking? Authorizing Provider  acetaminophen  (TYLENOL ) 325 MG tablet Take 2 tablets (650 mg total) by mouth every 6 (six) hours as needed for mild pain (or Fever >/= 101). 08/12/18   Swayze, Ava, DO  albuterol  (PROAIR  HFA) 108 (90 Base) MCG/ACT inhaler Inhale 2 puffs into the lungs every 6 (six) hours as needed for wheezing or shortness of breath. 08/02/18   Hongalgi, Anand D, MD  aspirin  EC 81 MG EC tablet Take 1 tablet (81 mg total) by mouth daily. 08/13/18   Swayze, Ava, DO  carvedilol  (COREG ) 12.5 MG tablet TAKE 1 TABLET TWICE A DAY WITH MEALS (PLEASE SCHEDULE AN APPOINTMENT FOR FURTHER REFILLS) 02/03/23   Johnson, Laymon HERO, PA-C  Cholecalciferol (VITAMIN D3) 10 MCG (400 UNIT) CAPS Take 400 Units by mouth daily.    [provider]  digoxin  (LANOXIN ) 0.125 MG tablet Take 1 tablet (125 mcg total) by mouth daily. 09/19/22   Lavona Agent, MD  ENTRESTO  24-26 MG TAKE 1 TABLET TWICE A DAY 02/03/23   Strader, [place] M, PA-C  ezetimibe  (ZETIA ) 10 MG tablet Take 10 mg by mouth daily.    [provider]  fenofibrate  160 MG tablet Take 160 mg by mouth daily.    [provider]  furosemide  (LASIX ) 40 MG tablet TAKE 1 TABLET DAILY  (MAINTENANCE DOSE IS NOW 40 MG DAILY) 09/29/23   Lavona Agent, MD  GARLIC PO Take by mouth.    [provider]  glipiZIDE  (GLUCOTROL  XL) 10 MG 24 hr tablet TAKE 1 TABLET DAILY WITH BREAKFAST 04/22/23   Therisa Folks J, NP  ipratropium (ATROVENT) 0.02 % nebulizer solution Take 2.5 mLs (0.5 mg dose) by nebulization 2 (two) times daily. 03/30/18   [provider]  linagliptin  (TRADJENTA ) 5 MG TABS tablet Take 1 tablet (5 mg total) by mouth daily. 09/09/23   Therisa Folks PARAS, NP  Multiple Vitamin (MULTIVITAMIN) tablet Take 1 tablet by mouth daily.    [provider]  nitroGLYCERIN  (NITROSTAT ) 0.4 MG SL tablet Place 1 tablet (0.4 mg total) under the tongue every 5 (five) minutes as needed for chest pain. 04/23/22   Lavona Agent, MD  Omega-3 Fatty Acids (FISH OIL) 1200 MG CAPS Take 1,000 mg by mouth daily.     [provider]  [facility] VERIO test strip  11/25/21   [provider]  OVER THE COUNTER MEDICATION Over the Counter Beet Chews - patient takes 1 daily in the morning    [provider]  pantoprazole  (PROTONIX ) 40 MG tablet Take 1 tablet (40 mg total) by mouth daily. 08/13/18   Swayze, Ava, DO  pravastatin  (PRAVACHOL ) 40 MG tablet Take 80 mg by  mouth daily.  07/12/18   [provider]  UNABLE TO FIND Beet root    [provider]  warfarin (COUMADIN ) 7.5 MG tablet Take 1 tablet (7.5 mg total) by mouth daily. 08/03/18   Hongalgi, Anand D, MD    Allergies: Penicillins, Other, Statins, Penicillin g sodium, Spironolactone , and Statins support [acid blockers support]    Review of Systems  All other systems reviewed and are negative.   Updated Vital Signs BP (!) 156/62   Pulse 70   Temp 98.4 F (36.9 C) (Oral)   Resp 14   Ht 5' 6 (1.676 m)   Wt 63.5 kg   SpO2 97%   BMI 22.60 kg/m   Physical Exam Vitals and nursing note reviewed.  Constitutional:      Appearance: He is well-developed.  HENT:     Head: Atraumatic.   Eyes:     Extraocular Movements: Extraocular movements intact.     Pupils: Pupils are equal, round, and reactive to light.  Cardiovascular:     Rate and Rhythm: Normal rate.  Pulmonary:     Effort: Pulmonary effort is normal.  Abdominal:     Tenderness: There is abdominal tenderness. There is no guarding or rebound.     Comments: RLQ tenderness  Musculoskeletal:     Cervical back: Neck supple.  Skin:    General: Skin is warm.  Neurological:     Mental Status: He is alert and oriented to person, place, and time.     (all labs ordered are listed, but only abnormal results are displayed) Labs Reviewed  CBC WITH DIFFERENTIAL/PLATELET - Abnormal; Notable for the following components:      Result Value   WBC 26.5 (*)    Hemoglobin 12.1 (*)    HCT 38.1 (*)    RDW 15.9 (*)    Neutro Abs 8.7 (*)    Lymphs Abs 11.9 (*)    Monocytes Absolute 1.1 (*)    Abs Immature Granulocytes 0.50 (*)    All other components within normal limits  BASIC METABOLIC PANEL WITH GFR - Abnormal; Notable for the following components:   Glucose, Bld 131 (*)    All other components within normal limits  CBC - Abnormal; Notable for the following components:   WBC 34.0 (*)    Hemoglobin 12.2 (*)    HCT 37.7 (*)    RDW 15.9 (*)    All other components within normal limits  BASIC METABOLIC PANEL WITH GFR - Abnormal; Notable for the following components:   Glucose, Bld 128 (*)    All other components within normal limits  PHOSPHORUS - Abnormal; Notable for the following components:   Phosphorus <1.0 (*)    All other components within normal limits  CBC WITH DIFFERENTIAL/PLATELET - Abnormal; Notable for the following components:   WBC 33.9 (*)    RBC 3.87 (*)    Hemoglobin 11.0 (*)    HCT 33.7 (*)    RDW 16.0 (*)    Lymphs Abs 23.9 (*)    Monocytes Absolute 2.9 (*)    Abs Immature Granulocytes 0.11 (*)    All other components within normal limits  HEPATIC FUNCTION PANEL - Abnormal; Notable for the  following components:   Total Protein 5.3 (*)    Albumin 3.1 (*)    Alkaline Phosphatase 32 (*)    All other components within normal limits  CBG MONITORING, ED - Abnormal; Notable for the following components:   Glucose-Capillary 60 (*)  All other components within normal limits  CBG MONITORING, ED - Abnormal; Notable for the following components:   Glucose-Capillary 163 (*)    All other components within normal limits  CBG MONITORING, ED - Abnormal; Notable for the following components:   Glucose-Capillary 153 (*)    All other components within normal limits  CBG MONITORING, ED - Abnormal; Notable for the following components:   Glucose-Capillary 165 (*)    All other components within normal limits  CULTURE, BLOOD (ROUTINE X 2)  CULTURE, BLOOD (ROUTINE X 2)  RESP PANEL BY RT-PCR (RSV, FLU A&B, COVID)  RVPGX2  PROTIME-INR  MAGNESIUM   LACTATE DEHYDROGENASE  URIC ACID  PROTIME-INR  APTT  PATHOLOGIST SMEAR REVIEW  OCCULT BLOOD X 1 CARD TO LAB, STOOL  BETA 2 MICROGLOBULIN, SERUM  DIGOXIN  LEVEL  CBG MONITORING, ED  TYPE AND SCREEN    EKG: EKG Interpretation Date/Time:  Saturday October 24 2023 02:33:39 EDT Ventricular Rate:  59 PR Interval:    QRS Duration:  117 QT Interval:  436 QTC Calculation: 432 R Axis:   91  Text Interpretation: Atrial fibrillation Nonspecific intraventricular conduction delay Anterior infarct, old Nonspecific repol abnormality, inferior leads When compared with ECG of 01/06/2019, No significant change was found Confirmed by Raford Lenis ([id number]) on 10/24/2023 3:37:59 AM  Radiology: CT CHEST ABDOMEN PELVIS W CONTRAST Result Date: 10/23/2023 CLINICAL DATA:  Anemia, right lower quadrant abdominal pain, melena EXAM: CT CHEST, ABDOMEN, AND PELVIS WITH CONTRAST TECHNIQUE: Multidetector CT imaging of the chest, abdomen and pelvis was performed following the standard protocol during bolus administration of intravenous contrast. RADIATION DOSE REDUCTION: This  exam was performed according to the departmental dose-optimization program which includes automated exposure control, adjustment of the mA and/or kV according to patient size and/or use of iterative reconstruction technique. CONTRAST:  OMNIPAQUE  IOHEXOL  300 MG/ML  SOLN COMPARISON:  None Available. FINDINGS: CT CHEST FINDINGS Cardiovascular: Extensive multi-vessel coronary artery calcification. Global cardiac size isglobally within normal limits though left atrial enlargement is noted. No pericardial effusion. Central pulmonary arteries are enlarged in keeping with changes of pulmonary arterial hypertension. Extensive atherosclerotic calcification within the thoracic aorta. No aortic aneurysm. Mediastinum/Nodes: There is pathologic bilateral axillary, mediastinal, and bilateral hilar adenopathy with the index lymph node within the subcarinal lymph node group measuring to 20 mm in short axis diameter. This is nonspecific, but may be reactive, inflammatory as can be seen with conditions such as sarcoidosis, or lymphoproliferative in etiology. Esophagus is unremarkable. Visualized thyroid is unremarkable. Lungs/Pleura: Mild emphysema. Mild bibasilar pulmonary fibrotic change. No confluent pulmonary infiltrate. Mild scattered parenchymal scarring within the right upper lobe. No pneumothorax or pleural effusion. No central obstructing lesion. Musculoskeletal: Multiple remote left rib fractures noted. No acute bone abnormality. No lytic or blastic bone lesion. CT ABDOMEN PELVIS FINDINGS Hepatobiliary: Cholelithiasis without superimposed pericholecystic inflammatory change. Liver unremarkable. No intra or extrahepatic biliary ductal dilation. Pancreas: Unremarkable Spleen: Unremarkable Adrenals/Urinary Tract: Adrenal glands are unremarkable. Kidneys are normal, without renal calculi, focal lesion, or hydronephrosis. Bladder is unremarkable. Stomach/Bowel: Severe descending and sigmoid diverticulosis. Stomach, small  bowel, and large bowel are otherwise unremarkable. Appendix normal. No evidence of obstruction or focal inflammation. No free intraperitoneal gas or fluid. Vascular/Lymphatic: Extensive aortoiliac atherosclerotic calcification. Extensive visceral arteriosclerosis. Pathologic bilateral retrocrural adenopathy is present with lymph nodes measuring up to 11 mm in short axis diameter. Reproductive: Prostate is unremarkable. Other: Tiny fat containing right inguinal hernia Musculoskeletal: No acute or significant osseous findings. IMPRESSION: 1. No acute intrathoracic or  intra-abdominal pathology identified. No definite radiographic explanation for the patient's reported symptoms. 2. Extensive multi-vessel coronary artery calcification. 3. Left atrial enlargement. 4. Morphologic changes in keeping with pulmonary arterial hypertension. 5. Mild emphysema. 6. Pathologic bilateral axillary, mediastinal, bilateral hilar, and retrocrural adenopathy. This appears improved since prior examination of 03/02/2023 may be inflammatory, as can be seen with sarcoidosis, or reflective of a chronic low-grade lymphoproliferative process if there is any history of interval therapy. 7. Cholelithiasis. 8. Severe distal colonic diverticulosis without superimposed acute inflammatory change. Aortic Atherosclerosis (ICD10-I70.0) and Emphysema (ICD10-J43.9). Electronically Signed   By: Dorethia Molt M.D.   On: 10/23/2023 23:11     Procedures   Medications Ordered in the ED  0.9 %  sodium chloride  infusion ( Intravenous Rate/Dose Verify 10/24/23 0357)  pantoprazole  (PROTONIX ) injection 40 mg (40 mg Intravenous Given 10/24/23 0354)  aspirin  EC tablet 81 mg (has no administration in time range)  carvedilol  (COREG ) tablet 6.25 mg (6.25 mg Oral Given 10/24/23 0726)  digoxin  (LANOXIN ) tablet 125 mcg (has no administration in time range)  ezetimibe  (ZETIA ) tablet 10 mg (has no administration in time range)  fenofibrate  tablet 160 mg (has no  administration in time range)  pravastatin  (PRAVACHOL ) tablet 40 mg (has no administration in time range)  phosphorus (K PHOS  NEUTRAL) tablet 500 mg (500 mg Oral Given 10/24/23 0800)  sodium phosphate  30 mmol in sodium chloride  0.9 % 250 mL infusion (30 mmol Intravenous New Bag/Given 10/24/23 0852)  dextrose  50 % solution 50 mL (50 mLs Intravenous Given 10/23/23 2213)  ceFEPIme  (MAXIPIME ) 2 g in sodium chloride  0.9 % 100 mL IVPB (0 g Intravenous Stopped 10/23/23 2313)  metroNIDAZOLE  (FLAGYL ) IVPB 500 mg (0 mg Intravenous Stopped 10/24/23 0140)  iohexol  (OMNIPAQUE ) 300 MG/ML solution 100 mL (100 mLs Intravenous Contrast Given 10/23/23 2226)                                    Medical Decision Making Amount and/or Complexity of Data Reviewed Labs: ordered. Radiology: ordered.  Risk Prescription drug management. Decision regarding hospitalization.  83 year old male comes in with chief complaint of abnormal labs.  He had blood test done, due to melena and his hemoglobin was found to be less than 8.  He was advised to come to the ER.  Patient states that he has had melena for the last 2 weeks.  Patient has history of A-fib and is on Coumadin .  He also has CHF.  Additionally, it was discovered that patient was found to have concerns for possible lymphoma.  He has not followed up with [facility] for oncology workup.  Differential diagnosis for this patient includes intra-abdominal infection, given that he has right lower quadrant tenderness.  Wife states that he has been waking her up in the middle of the night for the last 5 days with abdominal pain.  Ruptured appendicitis with contained abscess also possible.  Additionally, lymphoma, peptic ulcer disease, AV malformations also possible.  Initial plan is to get basic labs.  Given the profound leukocytosis, CT scan was ordered.  I have reviewed patient's records including care everywhere.  Given that there was concerns for lymphoma, I have extended  the CTs to CT chest abdomen and pelvis.  Patient will need admission to the hospital.  I have discussed with him the lymphoma concerns, he would like to switch his care to [facility] health oncology.  Stable for admission at this  time.  Patient's hemoglobin in the ER has remained over 11.  There is elevated white count, I had activated sepsis workup, but at this time we do not have any evidence of sepsis.   Final diagnoses:  None    ED Discharge Orders     None          Charlyn Sora, MD 10/24/23 [phone]

## 2023-10-23 NOTE — ED Provider Notes (Incomplete)
 [place] EMERGENCY DEPARTMENT AT [facility] Provider Note   CSN: [id number] Arrival date & time: 10/23/23  1525     Patient presents with: GI Bleeding   Oscar [name] is a 83 y.o. male.  {Add pertinent medical, surgical, social history, OB history to HPI:32947} HPI     83 year old patient comes in with chief complaint of dark tarry stools for the last 2 weeks, low hemoglobin.  He has history of  Prior to Admission medications   Medication Sig Start Date End Date Taking? Authorizing Provider  acetaminophen  (TYLENOL ) 325 MG tablet Take 2 tablets (650 mg total) by mouth every 6 (six) hours as needed for mild pain (or Fever >/= 101). 08/12/18   Swayze, Ava, DO  albuterol  (PROAIR  HFA) 108 (90 Base) MCG/ACT inhaler Inhale 2 puffs into the lungs every 6 (six) hours as needed for wheezing or shortness of breath. 08/02/18   Hongalgi, Anand D, MD  aspirin  EC 81 MG EC tablet Take 1 tablet (81 mg total) by mouth daily. 08/13/18   Swayze, Ava, DO  carvedilol  (COREG ) 12.5 MG tablet TAKE 1 TABLET TWICE A DAY WITH MEALS (PLEASE SCHEDULE AN APPOINTMENT FOR FURTHER REFILLS) 02/03/23   Johnson, Laymon HERO, PA-C  Cholecalciferol (VITAMIN D3) 10 MCG (400 UNIT) CAPS Take 400 Units by mouth daily.    [provider]  digoxin  (LANOXIN ) 0.125 MG tablet Take 1 tablet (125 mcg total) by mouth daily. 09/19/22   Lavona Agent, MD  ENTRESTO  24-26 MG TAKE 1 TABLET TWICE A DAY 02/03/23   Johnson, [place] M, PA-C  ezetimibe  (ZETIA ) 10 MG tablet Take 10 mg by mouth daily.    [provider]  fenofibrate  160 MG tablet Take 160 mg by mouth daily.    [provider]  furosemide  (LASIX ) 40 MG tablet TAKE 1 TABLET DAILY (MAINTENANCE DOSE IS NOW 40 MG DAILY) 09/29/23   Lavona Agent, MD  GARLIC PO Take by mouth.    [provider]  glipiZIDE  (GLUCOTROL  XL) 10 MG 24 hr tablet TAKE 1 TABLET DAILY WITH BREAKFAST 04/22/23   Therisa Folks J, NP  ipratropium (ATROVENT) 0.02 %  nebulizer solution Take 2.5 mLs (0.5 mg dose) by nebulization 2 (two) times daily. 03/30/18   [provider]  linagliptin  (TRADJENTA ) 5 MG TABS tablet Take 1 tablet (5 mg total) by mouth daily. 09/09/23   Therisa Folks PARAS, NP  Multiple Vitamin (MULTIVITAMIN) tablet Take 1 tablet by mouth daily.    [provider]  nitroGLYCERIN  (NITROSTAT ) 0.4 MG SL tablet Place 1 tablet (0.4 mg total) under the tongue every 5 (five) minutes as needed for chest pain. 04/23/22   Lavona Agent, MD  Omega-3 Fatty Acids (FISH OIL) 1200 MG CAPS Take 1,000 mg by mouth daily.     [provider]  [facility] VERIO test strip  11/25/21   [provider]  OVER THE COUNTER MEDICATION Over the Counter Beet Chews - patient takes 1 daily in the morning    [provider]  pantoprazole  (PROTONIX ) 40 MG tablet Take 1 tablet (40 mg total) by mouth daily. 08/13/18   Swayze, Ava, DO  pravastatin  (PRAVACHOL ) 40 MG tablet Take 80 mg by mouth daily.  07/12/18   [provider]  UNABLE TO FIND Beet root    [provider]  warfarin (COUMADIN ) 7.5 MG tablet Take 1 tablet (7.5 mg total) by mouth daily. 08/03/18   Hongalgi, Trenda BIRCH, MD    Allergies: Penicillins, Other, Statins, Penicillin  g sodium, Spironolactone , and Statins support [acid blockers support]    Review of Systems  Updated Vital Signs BP 139/60 (BP Location: Right Arm)   Pulse 71   Temp 97.8 F (36.6 C) (Oral)   Resp 17   Ht 5' 6 (1.676 m)   Wt 63.5 kg   SpO2 95%   BMI 22.60 kg/m   Physical Exam  (all labs ordered are listed, but only abnormal results are displayed) Labs Reviewed  CBC WITH DIFFERENTIAL/PLATELET - Abnormal; Notable for the following components:      Result Value   WBC 26.5 (*)    Hemoglobin 12.1 (*)    HCT 38.1 (*)    RDW 15.9 (*)    Neutro Abs 8.7 (*)    Lymphs Abs 11.9 (*)    Monocytes Absolute 1.1 (*)    Abs Immature Granulocytes 0.50 (*)    All other components within normal  limits  BASIC METABOLIC PANEL WITH GFR - Abnormal; Notable for the following components:   Glucose, Bld 131 (*)    All other components within normal limits  CBC - Abnormal; Notable for the following components:   WBC 34.0 (*)    Hemoglobin 12.2 (*)    HCT 37.7 (*)    RDW 15.9 (*)    All other components within normal limits  CBG MONITORING, ED - Abnormal; Notable for the following components:   Glucose-Capillary 60 (*)    All other components within normal limits  CULTURE, BLOOD (ROUTINE X 2)  CULTURE, BLOOD (ROUTINE X 2)  PATHOLOGIST SMEAR REVIEW  CBG MONITORING, ED  TYPE AND SCREEN    EKG: None  Radiology: CT CHEST ABDOMEN PELVIS W CONTRAST Result Date: 10/23/2023 CLINICAL DATA:  Anemia, right lower quadrant abdominal pain, melena EXAM: CT CHEST, ABDOMEN, AND PELVIS WITH CONTRAST TECHNIQUE: Multidetector CT imaging of the chest, abdomen and pelvis was performed following the standard protocol during bolus administration of intravenous contrast. RADIATION DOSE REDUCTION: This exam was performed according to the departmental dose-optimization program which includes automated exposure control, adjustment of the mA and/or kV according to patient size and/or use of iterative reconstruction technique. CONTRAST:  OMNIPAQUE  IOHEXOL  300 MG/ML  SOLN COMPARISON:  None Available. FINDINGS: CT CHEST FINDINGS Cardiovascular: Extensive multi-vessel coronary artery calcification. Global cardiac size isglobally within normal limits though left atrial enlargement is noted. No pericardial effusion. Central pulmonary arteries are enlarged in keeping with changes of pulmonary arterial hypertension. Extensive atherosclerotic calcification within the thoracic aorta. No aortic aneurysm. Mediastinum/Nodes: There is pathologic bilateral axillary, mediastinal, and bilateral hilar adenopathy with the index lymph node within the subcarinal lymph node group measuring to 20 mm in short axis diameter. This is  nonspecific, but may be reactive, inflammatory as can be seen with conditions such as sarcoidosis, or lymphoproliferative in etiology. Esophagus is unremarkable. Visualized thyroid is unremarkable. Lungs/Pleura: Mild emphysema. Mild bibasilar pulmonary fibrotic change. No confluent pulmonary infiltrate. Mild scattered parenchymal scarring within the right upper lobe. No pneumothorax or pleural effusion. No central obstructing lesion. Musculoskeletal: Multiple remote left rib fractures noted. No acute bone abnormality. No lytic or blastic bone lesion. CT ABDOMEN PELVIS FINDINGS Hepatobiliary: Cholelithiasis without superimposed pericholecystic inflammatory change. Liver unremarkable. No intra or extrahepatic biliary ductal dilation. Pancreas: Unremarkable Spleen: Unremarkable Adrenals/Urinary Tract: Adrenal glands are unremarkable. Kidneys are normal, without renal calculi, focal lesion, or hydronephrosis. Bladder is unremarkable. Stomach/Bowel: Severe descending and sigmoid diverticulosis. Stomach, small bowel, and large bowel are otherwise unremarkable. Appendix normal. No evidence of obstruction  or focal inflammation. No free intraperitoneal gas or fluid. Vascular/Lymphatic: Extensive aortoiliac atherosclerotic calcification. Extensive visceral arteriosclerosis. Pathologic bilateral retrocrural adenopathy is present with lymph nodes measuring up to 11 mm in short axis diameter. Reproductive: Prostate is unremarkable. Other: Tiny fat containing right inguinal hernia Musculoskeletal: No acute or significant osseous findings. IMPRESSION: 1. No acute intrathoracic or intra-abdominal pathology identified. No definite radiographic explanation for the patient's reported symptoms. 2. Extensive multi-vessel coronary artery calcification. 3. Left atrial enlargement. 4. Morphologic changes in keeping with pulmonary arterial hypertension. 5. Mild emphysema. 6. Pathologic bilateral axillary, mediastinal, bilateral hilar, and  retrocrural adenopathy. This appears improved since prior examination of 03/02/2023 may be inflammatory, as can be seen with sarcoidosis, or reflective of a chronic low-grade lymphoproliferative process if there is any history of interval therapy. 7. Cholelithiasis. 8. Severe distal colonic diverticulosis without superimposed acute inflammatory change. Aortic Atherosclerosis (ICD10-I70.0) and Emphysema (ICD10-J43.9). Electronically Signed   By: Dorethia Molt M.D.   On: 10/23/2023 23:11    {Document cardiac monitor, telemetry assessment procedure when appropriate:32947} Procedures   Medications Ordered in the ED  0.9 %  sodium chloride  infusion (has no administration in time range)  metroNIDAZOLE  (FLAGYL ) IVPB 500 mg (has no administration in time range)  dextrose  50 % solution 50 mL (50 mLs Intravenous Given 10/23/23 2213)  ceFEPIme  (MAXIPIME ) 2 g in sodium chloride  0.9 % 100 mL IVPB (0 g Intravenous Stopped 10/23/23 2313)  iohexol  (OMNIPAQUE ) 300 MG/ML solution 100 mL (100 mLs Intravenous Contrast Given 10/23/23 2226)      {Click here for ABCD2, HEART and other calculators REFRESH Note before signing:1}                              Medical Decision Making Amount and/or Complexity of Data Reviewed Labs: ordered. Radiology: ordered.  Risk Prescription drug management. Decision regarding hospitalization.   ***  {Document critical care time when appropriate  Document review of labs and clinical decision tools ie [id number], etc  Document your independent review of radiology images and any outside records  Document your discussion with family members, caretakers and with consultants  Document social determinants of health affecting pt's care  Document your decision making why or why not admission, treatments were needed:32947:::1}   Final diagnoses:  None    ED Discharge Orders     None

## 2023-10-24 DIAGNOSIS — K922 Gastrointestinal hemorrhage, unspecified: Secondary | ICD-10-CM | POA: Diagnosis not present

## 2023-10-24 DIAGNOSIS — C9500 Acute leukemia of unspecified cell type not having achieved remission: Secondary | ICD-10-CM | POA: Diagnosis not present

## 2023-10-24 LAB — APTT: aPTT: 28 s (ref 24–36)

## 2023-10-24 LAB — MAGNESIUM: Magnesium: 2.2 mg/dL (ref 1.7–2.4)

## 2023-10-24 LAB — CBC WITH DIFFERENTIAL/PLATELET
Abs Immature Granulocytes: 0.11 K/uL — ABNORMAL HIGH (ref 0.00–0.07)
Basophils Absolute: 0.1 K/uL (ref 0.0–0.1)
Basophils Relative: 0 %
Eosinophils Absolute: 0.1 K/uL (ref 0.0–0.5)
Eosinophils Relative: 0 %
HCT: 33.7 % — ABNORMAL LOW (ref 39.0–52.0)
Hemoglobin: 11 g/dL — ABNORMAL LOW (ref 13.0–17.0)
Immature Granulocytes: 0 %
Lymphocytes Relative: 72 %
Lymphs Abs: 23.9 K/uL — ABNORMAL HIGH (ref 0.7–4.0)
MCH: 28.4 pg (ref 26.0–34.0)
MCHC: 32.6 g/dL (ref 30.0–36.0)
MCV: 87.1 fL (ref 80.0–100.0)
Monocytes Absolute: 2.9 K/uL — ABNORMAL HIGH (ref 0.1–1.0)
Monocytes Relative: 8 %
Neutro Abs: 6.9 K/uL (ref 1.7–7.7)
Neutrophils Relative %: 20 %
Platelets: 331 K/uL (ref 150–400)
RBC: 3.87 MIL/uL — ABNORMAL LOW (ref 4.22–5.81)
RDW: 16 % — ABNORMAL HIGH (ref 11.5–15.5)
WBC: 33.9 K/uL — ABNORMAL HIGH (ref 4.0–10.5)
nRBC: 0 % (ref 0.0–0.2)

## 2023-10-24 LAB — RESP PANEL BY RT-PCR (RSV, FLU A&B, COVID)  RVPGX2
Influenza A by PCR: NEGATIVE
Influenza B by PCR: NEGATIVE
Resp Syncytial Virus by PCR: NEGATIVE
SARS Coronavirus 2 by RT PCR: NEGATIVE

## 2023-10-24 LAB — HEPATIC FUNCTION PANEL
ALT: 16 U/L (ref 0–44)
AST: 19 U/L (ref 15–41)
Albumin: 3.1 g/dL — ABNORMAL LOW (ref 3.5–5.0)
Alkaline Phosphatase: 32 U/L — ABNORMAL LOW (ref 38–126)
Bilirubin, Direct: 0.1 mg/dL (ref 0.0–0.2)
Indirect Bilirubin: 0.5 mg/dL (ref 0.3–0.9)
Total Bilirubin: 0.6 mg/dL (ref 0.0–1.2)
Total Protein: 5.3 g/dL — ABNORMAL LOW (ref 6.5–8.1)

## 2023-10-24 LAB — PROTIME-INR
INR: 1.1 (ref 0.8–1.2)
Prothrombin Time: 14.9 s (ref 11.4–15.2)

## 2023-10-24 LAB — BASIC METABOLIC PANEL WITH GFR
Anion gap: 8 (ref 5–15)
BUN: 22 mg/dL (ref 8–23)
CO2: 23 mmol/L (ref 22–32)
Calcium: 8.9 mg/dL (ref 8.9–10.3)
Chloride: 107 mmol/L (ref 98–111)
Creatinine, Ser: 1.03 mg/dL (ref 0.61–1.24)
GFR, Estimated: >60 mL/min (ref >60)
Glucose, Bld: 128 mg/dL — ABNORMAL HIGH (ref 70–99)
Potassium: 4 mmol/L (ref 3.5–5.1)
Sodium: 138 mmol/L (ref 135–145)

## 2023-10-24 LAB — URIC ACID: Uric Acid, Serum: 4.3 mg/dL (ref 3.7–8.6)

## 2023-10-24 LAB — CBG MONITORING, ED
Glucose-Capillary: 153 mg/dL — ABNORMAL HIGH (ref 70–99)
Glucose-Capillary: 165 mg/dL — ABNORMAL HIGH (ref 70–99)
Glucose-Capillary: 89 mg/dL (ref 70–99)

## 2023-10-24 LAB — PHOSPHORUS: Phosphorus: <1 mg/dL — CL (ref 2.5–4.6)

## 2023-10-24 LAB — DIGOXIN LEVEL: Digoxin Level: <0.6 ng/mL — ABNORMAL LOW (ref 0.8–2.0)

## 2023-10-24 LAB — LACTATE DEHYDROGENASE: LDH: 123 U/L (ref 98–192)

## 2023-10-24 MED ORDER — EZETIMIBE 10 MG PO TABS: 10.0000 mg | ORAL_TABLET | Freq: Every day | ORAL | Status: DC

## 2023-10-24 MED ORDER — ASPIRIN 81 MG PO TBEC: 81.0000 mg | DELAYED_RELEASE_TABLET | Freq: Every day | ORAL | Status: DC

## 2023-10-24 MED ORDER — DIGOXIN 125 MCG PO TABS: 125.0000 ug | ORAL_TABLET | Freq: Every day | ORAL | Status: DC

## 2023-10-24 MED ORDER — SODIUM PHOSPHATES 45 MMOLE/15ML IV SOLN
30.0000 mmol | Freq: Once | INTRAVENOUS | Status: DC
  Filled 2023-10-24: qty 10

## 2023-10-24 MED ORDER — CARVEDILOL 3.125 MG PO TABS
6.2500 mg | ORAL_TABLET | Freq: Two times a day (BID) | ORAL | Status: DC
Start: 2023-10-24 — End: 2023-10-24
  Administered 2023-10-24: 6.25 mg via ORAL
  Filled 2023-10-24: qty 2

## 2023-10-24 MED ORDER — FENOFIBRATE 160 MG PO TABS: 160.0000 mg | ORAL_TABLET | Freq: Every day | ORAL | Status: DC

## 2023-10-24 MED ORDER — PRAVASTATIN SODIUM 40 MG PO TABS: 40.0000 mg | ORAL_TABLET | Freq: Every day | ORAL | Status: DC

## 2023-10-24 MED ORDER — K PHOS MONO-SOD PHOS DI & MONO 155-852-130 MG PO TABS
500.0000 mg | ORAL_TABLET | ORAL | Status: DC
  Administered 2023-10-24: 500 mg via ORAL
  Filled 2023-10-24 (×3): qty 2

## 2023-10-24 MED ORDER — PANTOPRAZOLE SODIUM 40 MG IV SOLR
40.0000 mg | Freq: Two times a day (BID) | INTRAVENOUS | Status: DC
  Administered 2023-10-24: 40 mg via INTRAVENOUS
  Filled 2023-10-24: qty 10

## 2023-10-24 MED ORDER — SACUBITRIL-VALSARTAN 24-26 MG PO TABS: 1.0000 | ORAL_TABLET | Freq: Two times a day (BID) | ORAL | Status: DC

## 2023-10-24 MED ORDER — SODIUM PHOSPHATES 45 MMOLE/15ML IV SOLN
30.0000 mmol | Freq: Once | INTRAVENOUS | Status: AC
  Administered 2023-10-24: 30 mmol via INTRAVENOUS
  Filled 2023-10-24: qty 10

## 2023-10-24 NOTE — Progress Notes (Signed)
 Update: patient has been accepted to [facility], awaiting bed placement. Accepting physician is Dr. Kingsley.   Dorn Dawson, MD  [facility]

## 2023-10-24 NOTE — H&P (Signed)
 Hematology/Oncology   I saw and evaluated the patient, participating in the key portions of the service.  I reviewed the resident's note.  I agree with the resident's findings and plan. [name], MD  Attending Physician :  [name], MD Accepting Service  : Oncology/Hematology (MDE) Reason for Admission: Evaluation for acute leukemia  Problem List: Problem List[1]   Assessment/Plan: Oscar Cecil Cunliffe Sr. is an 83 y.o. male who who presented as transfer from outside hospital for workup of acute leukemia.  Concern for acute leukemia Axillary lymphadenopathy Per chart review hx of leukocytosis with hematology evaluation by Dr. Rogers in 2023 with findings notable for flow cytometry demonstrating a monoclonal B-cell population.  Lymphadenopathy dating back at least to 5/'20 from CT chest imaging, more recently hospitalized at [facility] in 1/'25 noted to have bulky mediastinal and axillary lymphadenopathy and was planned for excisional LN biopsy, and seen heme Dr. Onalee and Surgery Dr. Rosann at [facility], but patient has deferred scheduleding.  On admission to OSH, WBC elevated to 34, lymphocyte predominant, + 16% blasts, + smudge cells.  Evaluated by hematology team at OSH who expressed concern for possible ALL and recommendation for direct transfer to [facility] with hematology for further evaluation.  On evaluation the patient is well-appearing and in no acute distress.  He denies pain, fever, or unintentional weight loss.  He reports bleeding and bruising which he attributes to warfarin use.  No evidence of increased work of breathing - baseline EKG - hematopathology leukemia/lymphoma flow cytometry  - Ordered G6PD level, HBsAg, HBcAb, HCV Ab, HIV, HSV, beta-2  microglobulin  - CBC with differential daily - TLS labs daily (BMP, Phos, Albumin, Ca, uric acid, LDH, Mg) - DIC panel daily (PT, INR, aPTT, fibrinogen, D-dimer) - T&S q72h - Requested image transfer from [facility]  Chronic Conditions: Afib: HOLD coumadin  7.5 mg ( last dose 10/19/23) and ASA 81, continue home digoxin  125 mcg T2DM: continue home Tradjenta  5 mg daily, glipizide  10 mg daily HLD: continue home Zetia  10mg  QD, fenofibrate  160mg  QD,  CAD/HFmeEF: continue home Entresto  24-26, carvedilol  25 mg,  pravastatin  40mg  daily, lasix  20 mg QD    Impending Electrolyte Abnormality Secondary to Chemotherapy and/or IV Fluids - Daily Electrolyte monitoring - Replete per [facility] guidelines.   Anemia/thrombocytopenia secondary to disease: Patient with relatively normal blood counts in .  - Transfuse 1 unit of pRBCs for hgb >7 - Transfuse 1 unit plt for plts >10K   HPI: Oscar Saldivar Sr. is an 83 y.o. male PMH of atrial fibrillation (last dose 8/25) HTN, HLD, T2DM, HFmrEF (EF 45-50% 02/2023) presents as transfer from outside hospital for workup of acute leukemia.  [name], presented to his primary care physician on Thursday for evaluation of melanotic stools that started about 2 weeks ago.  At the visit he was found to have a hemoglobin of 7.7 was instructed to report to the emergency department for further evaluation.  In the ED, he was assessed for GI bleeding, subsequently negative for acute bleed.  States he did not receive any blood transfusions during hospitalization.  However based on his initial lab work there was concern for hematologic malignancy.   Per chart review, hx of prior leukocytosis for which he was referred to hematology in January 2025. Chronic lymphadenopathy on chest CT 1/25.  He was scheduled for an excisional biopsy to evaluate for possible lymphoma but was unable to attend the appointment.  The patient denies unintentional weight loss, fever or chills.  For abnormal skin bleeding and easy bruising which he attributes to warfarin use.  He denies headache, chest pain or shortness of breath.  Eyes persistently water, denies itching, purulent discharge, or trouble with  vision.  Denies reoccurrence of bloody stool, as he has not had a bowel movement since Thursday.  Denies current use of tobacco or alcohol or other illicit drugs  Review of Systems: All positive and pertinent negatives are noted in the HPI; otherwise all other systems are negative  Oncologic History:  Primary Oncologist: none Hematology/Oncology History   No problem history exists.    Medical History: PCP: Renato Dorothey HERO, NP Past Medical History[2] Surgical History: Past Surgical History[3]  Social History: Social History [4]  Family History:  family history includes Coronary artery disease in his mother; Diabetes in his mother; Heart attack in his mother; Heart disease in his mother; Heart failure in his mother; Hypertension in his mother.    Allergies: is allergic to penicillins, statins-hmg-coa reductase inhibitors, and spironolactone .  Medications:  Meds:Scheduled Medications[5] Continuous Infusions:Infusions Meds[6] PRN Meds:.PRN Medications[7]  Objective:  Vitals: Temp:  [36.5 C (97.7 F)-36.7 C (98.1 F)] 36.5 C (97.7 F) Pulse:  [74-78] 78 Resp:  [18] 18 BP: (104-158)/(72-77) 104/77 MAP (mmHg):  [86-95] 86 SpO2:  [97 %-99 %] 99 % BMI (Calculated):  [22.12] 22.12  Physical Exam: General: Resting, in no apparent distress, lying in bed HEENT:  PERRL.  Bilateral conjunctival injection. MMM without ulceration, erythema or exudate.  Palpable left axillary lymphadenopathy, nontender.  Heart:  RRR. S1, S2. No murmurs, gallops, or rubs. Lungs:  Breathing is unlabored, and patient is speaking full sentences with ease.  No stridor.  CTAB. No rales, ronchi, or crackles.   Abdomen:  No distention or pain on palpation.  Bowel sounds are present and normoactive x 4.  No palpable hepatomegaly or splenomegaly.  No palpable masses. Skin: Bilateral upper extremities reveals multiple scratches, abrasions and areas of bruising in different stages of healing, with no evidence of  active bleeding or infection Musculoskeletal:  No grossly-evident joint effusions or deformities.  Range of motion about the shoulder, elbow, hips and knees is grossly normal. Psychiatric:  Range of affect is appropriate.   Neurologic:  Alert and oriented to person, place, time and situation. CNII-CNXII grossly intact. Extremities:  Appear well-perfused. No clubbing, edema, or cyanosis.  Test Results Recent Labs    10/24/23 1626  WBC 21.9*  NEUTROABS 8.3*  HGB 12.3*  PLT 339   Recent Labs    10/24/23 1627  NA 143  K 4.7  CL 107  CO2 23.0  BUN 15  CREATININE 0.98  MG 2.2  PHOS 4.7  CALCIUM  9.6    Imaging: Request for image transfer from [facility]  Code Status: @PATFPLSTATCODE @  Full Code  I personally spent 35 minutes face-to-face and non-face-to-face in the care of this patient, which includes all pre, intra, and post visit time on the date of service.  All documented time was specific to the E/M visit and does not include any procedures that may have been performed.         [1] Patient Active Problem List Diagnosis  . A-fib      . CHF (congestive heart failure)      . HTN (hypertension)  . Type 2 diabetes mellitus      . Axillary lymphadenopathy  [2] Past Medical History: Diagnosis Date  . A-fib       . Aortic atherosclerosis   . CAD (coronary  artery disease)   . Carotid artery stenosis   . CHF (congestive heart failure)       . Chronic kidney disease    stage 3b  . COPD (chronic obstructive pulmonary disease)       . Diabetes mellitus        type 2  . Former smoker   . GERD (gastroesophageal reflux disease)   . High cholesterol   . Hypertension   . Hyponatremia   . Kidney stones   . Myocardial infarction       . Neuropathy   . Pneumonia 02/2023  . Small vessel disease    mild chronic small vessel ischemic disease  . TIA (transient ischemic attack) 07/24/2018  [3] Past Surgical History: Procedure Laterality Date  . CARDIAC CATHETERIZATION   07/29/2018   100% proximal - RCA stenosis, 99% Proximal LAD, 75% LM, 70% LCx, 40% PDA and 30% LCx  . CAROTID ENDARTERECTOMY Left 10/21/2005  . CAROTID ENDARTERECTOMY Right 08/11/2018  . CATARACT EXTRACTION, BILATERAL Bilateral 2020   Left 09/10/2018; right 11/12/2018  . PATCH ANGIOPLASTY (HEMASHIELD PLATINUM FINESSE) Right 08/11/2018  . TONSILLECTOMY    [4] Social History Socioeconomic History  . Marital status: Married    Spouse name: Rose  . Number of children: 4  Tobacco Use  . Smoking status: Former    Current packs/day: 0.00    Average packs/day: 2.0 packs/day for 20.0 years (40.0 ttl pk-yrs)    Types: Cigarettes    Start date: 68    Quit date: 2002    Years since quitting: 23.6    Passive exposure: Never  . Smokeless tobacco: Never  Vaping Use  . Vaping status: Never Used  Substance and Sexual Activity  . Alcohol use: Never  . Drug use: Never  . Sexual activity: Not Currently    Partners: Female   Social Drivers of Health   Financial Resource Strain: Low Risk  (02/28/2023)   Overall Financial Resource Strain (CARDIA)   . Difficulty of Paying Living Expenses: Not very hard  Food Insecurity: Food Insecurity Present (02/28/2023)   Hunger Vital Sign   . Worried About Programme researcher, broadcasting/film/video in the Last Year: Sometimes true   . Ran Out of Food in the Last Year: Sometimes true  Transportation Needs: No Transportation Needs (02/28/2023)   PRAPARE - Transportation   . Lack of Transportation (Medical): No   . Lack of Transportation (Non-Medical): No  Physical Activity: Insufficiently Active (02/28/2023)   Exercise Vital Sign   . Days of Exercise per Week: 1 day   . Minutes of Exercise per Session: 10 min  Stress: No Stress Concern Present (02/28/2023)   [employer] of Occupational Health - Occupational Stress Questionnaire   . Feeling of Stress : Not at all  Social Connections: Socially Integrated (02/28/2023)   Social Connection and Isolation Panel   . Frequency of  Communication with Friends and Family: More than three times a week   . Frequency of Social Gatherings with Friends and Family: More than three times a week   . Attends Religious Services: More than 4 times per year   . Active Member of Clubs or Organizations: Yes   . Attends Banker Meetings: More than 4 times per year   . Marital Status: Married  Housing: Low Risk  (02/28/2023)   Housing   . Within the past 12 months, have you ever stayed: outside, in a car, in a tent, in an overnight shelter, or temporarily in  someone else's home (i.e. couch-surfing)?: No   . Are you worried about losing your housing?: No  [5] . [START ON 10/25/2023] carvedilol   12.5 mg Oral BID  . [START ON 10/25/2023] digoxin   125 mcg Oral Daily  . [START ON 10/25/2023] ezetimibe   10 mg Oral Nightly  . fenofibrate   145 mg Oral Daily  . [START ON 10/25/2023] glipiZIDE   10 mg Oral Daily before breakfast  . insulin  lispro  0-20 Units Subcutaneous ACHS  . [START ON 10/25/2023] linagliptin   5 mg Oral Daily  . pantoprazole   40 mg Oral Daily  . pravastatin   40 mg Oral Daily  . [START ON 10/25/2023] sacubitril -valsartan   1 tablet Oral BID  . sodium chloride   10 mL Intravenous BID  . sodium chloride   10 mL Intravenous BID  . sodium chloride   10 mL Intravenous BID  [6] . sodium chloride     [7] dextrose  in water, emollient combination no.92, furosemide , glucagon, glucose, loperamide, loperamide

## 2023-10-24 NOTE — ED Notes (Signed)
 Received call from [facility], patient placement at [facility]. Pt has been accepted to [facility] on main campus, room 1107. [facility] will call for transport.

## 2023-10-24 NOTE — Progress Notes (Signed)
 Progress Note   Patient: Oscar [name] FMW:[id number] DOB: 10/09/1940 DOA: 10/23/2023     0 DOS: the patient was seen and examined on 10/24/2023   Brief hospital admission narrative: As per H&P written by Dr. Keturah on 10/24/2023 Oscar [name] is a 83 y.o. male with hx of Atrial fibrillation on [facility] with Warfarin, Heart failure with reduced ejection fraction, hypertension, diabetes type 2, hyperlipidemia, prior leukocytosis and heme eval demonstrating monoclonal B cell population in '23, and findings of chronic lymphadenopathy (present on CT chest from 5/'20) and this was evaluated during hospitalization at [facility] for Heart failure in 1/'25 and he was referred to hematology and ultimately was planned excisional LN biopsy to evaluate for lymphoma but patient has deferred scheduling this per review of chart.    He presented today due to dark stools x 2 weeks, and reportedly had Hb of 7.7 in the outpatient setting although appears Hb near his baseline on serial testing in the ED. Re: this history reports having intermittent dark stools and will stop his Warfarin when they occur due to concern for bleeding. These started again a little over 2 weeks ago, and have remained persistent having multiple episodes per day. Mild RLQ abd pain associated. He has no nausea or vomiting. No BRBPR. He stopped taking his Warfarin around the time of onset of symptoms and has not taken since. He is taking pepto bismol although started a few days after onset of symptoms.    Despite the hx above, he does not recall any prior concern for lymphoma or leukemia in the past, tells me about his prior evaluation with Dr. Rogers but does not bring up recent [facility] concern for lymphoma. Has been asymptomatic from this standpoint.   Assessment and plan 1-lymphocytosis -Patient with +60% blasts concerning for leukemia/lymphoma; presume ALL per hematologist on call review - Patient to be transferred to [facility] for further evaluation and  management - At time of admission WBC is up to 34K, lymphocyte predominant, +60% blasts and positive for smudge cells - Hematology recommended flow cytometry and beta-2  microglobulin; LDH and urine uric acid level stable. - Supportive care.  2-upper GI bleed - Patient on chronic anticoagulation secondary to secondary prevention for A-fib - Continue holding anticoagulation - Continue to follow hemoglobin trend - No overt bleeding currently appreciated - Continue PPI - Hemodynamically stable. - Gastroenterology service evaluation at receiving facility recommended. - Will allow for clear liquids.  3-type 2 diabetes mellitus with hypoglycemia - In the setting of decreased oral intake - Corrected after receiving D50 - Follow CBGs in the 160 range - Continue to hold hypoglycemic agents - Follow CBG fluctuation.  4-hyperlipidemia - Continue Zetia , fenofibrate  and Pravachol   5-history of coronary disease - Continue beta-blocker and a statin - No chest pain currently appreciated. - Continue to follow telemetry monitoring. - EKG without acute ischemic changes.  6-chronic systolic heart failure - Stable and compensated - Continue beta-blocker and digoxin  - For now holding Entresto  and Lasix  in the setting of GI bleed and decreased oral intake. - Follow daily weights and strict intake and output.  7-hypertension - Continue current antihypertensive agents - Hemodynamically stable - Follow-up vital signs.  Subjective:  No chest pain, no abdominal pain, no nausea, no vomiting.  Reports feeling comfortable and at the moment no experiencing any overt bleeding.  Hemodynamically stable.  Physical Exam: Vitals:   10/24/23 0645 10/24/23 0700 10/24/23 0726 10/24/23 0726  BP:  (!) 156/62 (!) 156/62   Pulse: Oscar [name])  58 70 70   Resp: 19 14    Temp:    98.4 F (36.9 C)  TempSrc:    Oral  SpO2: 96% 97%    Weight:      Height:       General exam: Alert, awake, oriented x 3; afebrile and in  no acute distress. Respiratory system: Clear to auscultation. Respiratory effort normal.  No saturation at room air Cardiovascular system:RRR. No rubs or gallops; no JVD. Gastrointestinal system: Abdomen is nondistended, soft and nontender.  Positive bowel sounds. Central nervous system:No focal neurological deficits. Extremities: No C/C/E, +pedal pulses Skin: No petechiae. Psychiatry: Judgement and insight appear normal. Mood & affect appropriate.   Latest data Reviewed: Respiratory panel by PCR: Negative for COVID, influenza and RSV PT-INR: 14.9-1.1 respectively Uric acid: 4.3 CBC: White blood cells 33.9 K, hemoglobin 11.0 and platelet counts 331K LDH: 123  Family Communication: No family at bedside.  Disposition: Status is: The patient remains OBS appropriate and will d/c before 2 midnights.   Patient will be transfer to [facility] for further evaluation and management.  Time spent: 35 minutes  Author: Eric Nunnery, MD 10/24/2023 10:16 AM  For on call review [email or web address].

## 2023-10-24 NOTE — Progress Notes (Signed)
 Secure chat with ED MD inquiring about labs well, actually he is not septic     you may take him off the list, he likely has cancer related elevated white count

## 2023-10-24 NOTE — Consult Note (Addendum)
 Consult    Oscar [name] FMW:[id number] DOB: [birth year] DOA: 10/23/2023  PCP: Renato Dorothey HERO, NP   Patient coming from: Home   Chief Complaint:  Chief Complaint  Patient presents with   GI Bleeding    HPI:  Oscar [name] is a 83 y.o. male with hx of Atrial fibrillation on [facility] with Warfarin, Heart failure with reduced ejection fraction, hypertension, diabetes type 2, hyperlipidemia, prior leukocytosis and heme eval demonstrating monoclonal B cell population in '23, and findings of chronic lymphadenopathy (present on CT chest from 5/'20) and this was evaluated during hospitalization at [facility] for Heart failure in 1/'25 and he was referred to hematology and ultimately was planned excisional LN biopsy to evaluate for lymphoma but patient has deferred scheduling this per review of chart.   He presented today due to dark stools x 2 weeks, and reportedly had Hb of 7.7 in the outpatient setting although appears Hb near his baseline on serial testing in the ED. Re: this history reports having intermittent dark stools and will stop his Warfarin when they occur due to concern for bleeding. These started again a little over 2 weeks ago, and have remained persistent having multiple episodes per day. Mild RLQ abd pain associated. He has no nausea or vomiting. No BRBPR. He stopped taking his Warfarin around the time of onset of symptoms and has not taken since. He is taking pepto bismol although started a few days after onset of symptoms.   Despite the hx above, he does not recall any prior concern for lymphoma or leukemia in the past, tells me about his prior evaluation with Dr. Rogers but does not bring up recent [facility] concern for lymphoma. Has been asymptomatic from this standpoint.    Review of Systems:  ROS complete and negative except as marked above   Allergies  Allergen Reactions   Penicillins Hives, Other (See Comments) and Rash    Blisters on hands Blisters on hands  Blisters on  hands Blisters on hands Blisters on hands Blisters on hands   Other Other (See Comments)    Joint and muscle pain and low energy. Joint and muscle pain and low energy.   Statins Other (See Comments)    Joint and muscle pain and low energy.   Penicillin G Sodium     Other reaction(s): hives   Spironolactone      Hyperkalemia Hyperkalemia Hyperkalemia   Statins Support [Acid Blockers Support]     Other reaction(s): weakness    Prior to Admission medications   Medication Sig Start Date End Date Taking? Authorizing Provider  acetaminophen  (TYLENOL ) 325 MG tablet Take 2 tablets (650 mg total) by mouth every 6 (six) hours as needed for mild pain (or Fever >/= 101). 08/12/18   Swayze, Ava, DO  albuterol  (PROAIR  HFA) 108 (90 Base) MCG/ACT inhaler Inhale 2 puffs into the lungs every 6 (six) hours as needed for wheezing or shortness of breath. 08/02/18   Hongalgi, Anand D, MD  aspirin  EC 81 MG EC tablet Take 1 tablet (81 mg total) by mouth daily. 08/13/18   Swayze, Ava, DO  carvedilol  (COREG ) 12.5 MG tablet TAKE 1 TABLET TWICE A DAY WITH MEALS (PLEASE SCHEDULE AN APPOINTMENT FOR FURTHER REFILLS) 02/03/23   Johnson, Laymon HERO, PA-C  Cholecalciferol (VITAMIN D3) 10 MCG (400 UNIT) CAPS Take 400 Units by mouth daily.    [provider]  digoxin  (LANOXIN ) 0.125 MG tablet Take 1 tablet (125 mcg total) by mouth daily. 09/19/22   Hochrein,  Lynwood, MD  ENTRESTO  24-26 MG TAKE 1 TABLET TWICE A DAY 02/03/23   Strader, [place] M, PA-C  ezetimibe  (ZETIA ) 10 MG tablet Take 10 mg by mouth daily.    [provider]  fenofibrate  160 MG tablet Take 160 mg by mouth daily.    [provider]  furosemide  (LASIX ) 40 MG tablet TAKE 1 TABLET DAILY (MAINTENANCE DOSE IS NOW 40 MG DAILY) 09/29/23   Lavona Lynwood, MD  GARLIC PO Take by mouth.    [provider]  glipiZIDE  (GLUCOTROL  XL) 10 MG 24 hr tablet TAKE 1 TABLET DAILY WITH BREAKFAST 04/22/23   Therisa Folks J, NP  ipratropium  (ATROVENT) 0.02 % nebulizer solution Take 2.5 mLs (0.5 mg dose) by nebulization 2 (two) times daily. 03/30/18   [provider]  linagliptin  (TRADJENTA ) 5 MG TABS tablet Take 1 tablet (5 mg total) by mouth daily. 09/09/23   Therisa Folks PARAS, NP  Multiple Vitamin (MULTIVITAMIN) tablet Take 1 tablet by mouth daily.    [provider]  nitroGLYCERIN  (NITROSTAT ) 0.4 MG SL tablet Place 1 tablet (0.4 mg total) under the tongue every 5 (five) minutes as needed for chest pain. 04/23/22   Lavona Lynwood, MD  Omega-3 Fatty Acids (FISH OIL) 1200 MG CAPS Take 1,000 mg by mouth daily.     [provider]  [facility] VERIO test strip  11/25/21   [provider]  OVER THE COUNTER MEDICATION Over the Counter Beet Chews - patient takes 1 daily in the morning    [provider]  pantoprazole  (PROTONIX ) 40 MG tablet Take 1 tablet (40 mg total) by mouth daily. 08/13/18   Swayze, Ava, DO  pravastatin  (PRAVACHOL ) 40 MG tablet Take 80 mg by mouth daily.  07/12/18   [provider]  UNABLE TO FIND Beet root    [provider]  warfarin (COUMADIN ) 7.5 MG tablet Take 1 tablet (7.5 mg total) by mouth daily. 08/03/18   Judeth Trenda BIRCH, MD    Past Medical History:  Diagnosis Date   Anemia    Atrial fibrillation (HCC)    permanent   CAD (coronary artery disease)    a. cath on 07/29/2018 showed 100% Proximal-RCA stenosis, 99% Proximal-LAD, 75% Ost LM, 70% LCx, 40% PDA, and 30% Ost LCx. Declined CABG   Cardiomyopathy    EF 35% in the past--Improved to 55% echo 2/10. Moderately severe mitral regurgitation in the past, now improved,   Carotid artery stenosis    CHF (congestive heart failure) (HCC) 2002   COPD (chronic obstructive pulmonary disease) (HCC)    Coronary artery disease    non obstructive   Diabetes mellitus    Dyslipidemia    History of tobacco abuse    Nephrolithiasis    Tremors of nervous system     Past Surgical History:  Procedure Laterality  Date   CAROTID ENDARTERECTOMY Left 10-21-05   cea   CATARACT EXTRACTION W/PHACO Left 09/10/2018   Procedure: CATARACT EXTRACTION PHACO AND INTRAOCULAR LENS PLACEMENT LEFT EYE;  Surgeon: Harrie Lynwood, MD;  Location: [facility];  Service: Ophthalmology;  Laterality: Left;  left   CATARACT EXTRACTION W/PHACO Right 11/12/2018   Procedure: CATARACT EXTRACTION PHACO AND INTRAOCULAR LENS PLACEMENT RIGHT EYE;  Surgeon: Harrie Lynwood, MD;  Location: [facility];  Service: Ophthalmology;  Laterality: Right;  right   ENDARTERECTOMY Right 08/11/2018   Procedure: ENDARTERECTOMY CAROTID RIGHT;  Surgeon: Oris Krystal FALCON, MD;  Location: [facility] OR;  Service: Vascular;  Laterality: Right;   left  carotid endarterectomy     PATCH ANGIOPLASTY Right 08/11/2018   Procedure: PATCH ANGIOPLAST USING HEMASHIELD PLATINUM FINESSE;  Surgeon: Oris Krystal FALCON, MD;  Location: [facility] OR;  Service: Vascular;  Laterality: Right;   RIGHT/LEFT HEART CATH AND CORONARY ANGIOGRAPHY N/A 07/29/2018   Procedure: RIGHT/LEFT HEART CATH AND CORONARY ANGIOGRAPHY;  Surgeon: Cherrie Toribio SAUNDERS, MD;  Location: [facility] INVASIVE CV LAB;  Service: Cardiovascular;  Laterality: N/A;   TONSILLECTOMY       reports that he quit smoking about 23 years ago. His smoking use included cigarettes. He has quit using smokeless tobacco. He reports that he does not drink alcohol and does not use drugs.  Family History  Problem Relation Age of Onset   Coronary artery disease Mother    Diabetes Mother    Heart disease Mother        Before age 10 and  CHF   Hypertension Mother    Other Mother        varicose veins   Heart attack Mother    Heart disease Sister    Hypertension Sister    Heart attack Sister    Heart disease Brother    Hyperlipidemia Brother    Hypertension Brother    Other Brother        varicose veins   Heart attack Brother    Heart disease Son 32       Heart Disease before age 84- Open hear surgery   Deep vein thrombosis Son    Heart attack Son    Coronary  artery disease Other    Diabetes Daughter    Hypertension Daughter    Other Daughter        varicose veins     Physical Exam: Vitals:   10/23/23 2317 10/23/23 2318 10/23/23 2319 10/23/23 2353  BP:    128/60  Pulse: 67 71 68 78  Resp:    15  Temp:      TempSrc:      SpO2: 94% 94% 94% 96%  Weight:      Height:        Gen: Awake, alert, chronically ill appearing, elderly. NAD  CV: Regular, normal S1, S2, 1/6 SEM  Resp: Normal WOB, CTAB  Abd: Flat, normoactive, mild RLQ TTP  MSK: Symmetric, no edema  Skin: No rashes or lesions to exposed skin  Neuro: Alert and interactive  Psych: euthymic, appropriate    Data review:   Labs reviewed, notable for:   WBC 26 -> 34, lymphocyte predominant, + 16% blasts, + smudge cells.  Chemistries unremarkable.  Glucose 60 -> 163 with amp of D50   Micro:  Results for orders placed or performed during the hospital encounter of 11/09/18  SARS CORONAVIRUS 2 (TAT 6-24 HRS) Nasopharyngeal Nasopharyngeal Swab     Status: None   Collection Time: 11/09/18  7:11 AM   Specimen: Nasopharyngeal Swab  Result Value Ref Range Status   SARS Coronavirus 2 NEGATIVE NEGATIVE Final    Comment: (NOTE) SARS-CoV-2 target nucleic acids are NOT DETECTED. The SARS-CoV-2 RNA is generally detectable in upper and lower respiratory specimens during the acute phase of infection. Negative results do not preclude SARS-CoV-2 infection, do not rule out co-infections with other pathogens, and should not be used as the sole basis for treatment or other patient management decisions. Negative results must be combined with clinical observations, patient history, and epidemiological information. The expected result is Negative. Fact Sheet for Patients: HairSlick.no Fact Sheet for Healthcare Providers: quierodirigir.com This test  is not yet approved or cleared by the [place]  FDA and  has been authorized for  detection and/or diagnosis of SARS-CoV-2 by FDA under an Emergency Use Authorization (EUA). This EUA will remain  in effect (meaning this test can be used) for the duration of the COVID-19 declaration under Section 56 4(b)(1) of the Act, 21 U.S.C. section 360bbb-3(b)(1), unless the authorization is terminated or revoked sooner. Performed at [facility], [address], [place], [place] [address]     Imaging reviewed:  CT CHEST ABDOMEN PELVIS W CONTRAST Result Date: 10/23/2023 CLINICAL DATA:  Anemia, right lower quadrant abdominal pain, melena EXAM: CT CHEST, ABDOMEN, AND PELVIS WITH CONTRAST TECHNIQUE: Multidetector CT imaging of the chest, abdomen and pelvis was performed following the standard protocol during bolus administration of intravenous contrast. RADIATION DOSE REDUCTION: This exam was performed according to the departmental dose-optimization program which includes automated exposure control, adjustment of the mA and/or kV according to patient size and/or use of iterative reconstruction technique. CONTRAST:  OMNIPAQUE  IOHEXOL  300 MG/ML  SOLN COMPARISON:  None Available. FINDINGS: CT CHEST FINDINGS Cardiovascular: Extensive multi-vessel coronary artery calcification. Global cardiac size isglobally within normal limits though left atrial enlargement is noted. No pericardial effusion. Central pulmonary arteries are enlarged in keeping with changes of pulmonary arterial hypertension. Extensive atherosclerotic calcification within the thoracic aorta. No aortic aneurysm. Mediastinum/Nodes: There is pathologic bilateral axillary, mediastinal, and bilateral hilar adenopathy with the index lymph node within the subcarinal lymph node group measuring to 20 mm in short axis diameter. This is nonspecific, but may be reactive, inflammatory as can be seen with conditions such as sarcoidosis, or lymphoproliferative in etiology. Esophagus is unremarkable. Visualized thyroid is unremarkable.  Lungs/Pleura: Mild emphysema. Mild bibasilar pulmonary fibrotic change. No confluent pulmonary infiltrate. Mild scattered parenchymal scarring within the right upper lobe. No pneumothorax or pleural effusion. No central obstructing lesion. Musculoskeletal: Multiple remote left rib fractures noted. No acute bone abnormality. No lytic or blastic bone lesion. CT ABDOMEN PELVIS FINDINGS Hepatobiliary: Cholelithiasis without superimposed pericholecystic inflammatory change. Liver unremarkable. No intra or extrahepatic biliary ductal dilation. Pancreas: Unremarkable Spleen: Unremarkable Adrenals/Urinary Tract: Adrenal glands are unremarkable. Kidneys are normal, without renal calculi, focal lesion, or hydronephrosis. Bladder is unremarkable. Stomach/Bowel: Severe descending and sigmoid diverticulosis. Stomach, small bowel, and large bowel are otherwise unremarkable. Appendix normal. No evidence of obstruction or focal inflammation. No free intraperitoneal gas or fluid. Vascular/Lymphatic: Extensive aortoiliac atherosclerotic calcification. Extensive visceral arteriosclerosis. Pathologic bilateral retrocrural adenopathy is present with lymph nodes measuring up to 11 mm in short axis diameter. Reproductive: Prostate is unremarkable. Other: Tiny fat containing right inguinal hernia Musculoskeletal: No acute or significant osseous findings. IMPRESSION: 1. No acute intrathoracic or intra-abdominal pathology identified. No definite radiographic explanation for the patient's reported symptoms. 2. Extensive multi-vessel coronary artery calcification. 3. Left atrial enlargement. 4. Morphologic changes in keeping with pulmonary arterial hypertension. 5. Mild emphysema. 6. Pathologic bilateral axillary, mediastinal, bilateral hilar, and retrocrural adenopathy. This appears improved since prior examination of 03/02/2023 may be inflammatory, as can be seen with sarcoidosis, or reflective of a chronic low-grade lymphoproliferative  process if there is any history of interval therapy. 7. Cholelithiasis. 8. Severe distal colonic diverticulosis without superimposed acute inflammatory change. Aortic Atherosclerosis (ICD10-I70.0) and Emphysema (ICD10-J43.9). Electronically Signed   By: Dorethia Molt M.D.   On: 10/23/2023 23:11   ED Course:  Initial concern for possible sepsis and he was treated with Cefepime , and Flagyl . For GI bleeding, Edp messaged GI Dr. Shaaron  about his case.     Assessment/Plan:  83 y.o. male with hx Atrial fibrillation on AC with Warfarin, Heart failure with reduced ejection fraction, CAD with hx of multivessel CAD has opted for medical management, Carotid stenosis, hx LCEA, TIA, hypertension, diabetes type 2, hyperlipidemia, prior leukocytosis and heme eval demonstrating monoclonal B cell population in '23, and findings of chronic lymphadenopathy (present on CT chest from 5/'20) and this was evaluated during hospitalization at [facility] for Heart failure in 1/'25 and he was referred to hematology and ultimately was planned excisional LN biopsy to evaluate for lymphoma but patient has deferred scheduling this per review of chart. He presented today due to dark stools x 2 weeks, and referred for admit for UGIB. However concern on review of his labs and prior hx for leukemia, possible acute phase with + blasts.   Lymphocytosis, + 16% blasts  Concern for leukemia/lymphoma, possible ALL per heme review  Hx of leukocytosis with hematology evaluation by Dr. Rogers in 2023 with findings notable for flow cytometry demonstrating a monoclonal B-cell population.  Also with lymphadenopathy dating back at least to 5/'20 from CT chest imaging, more recently hospitalized at [facility] in 1/'25 noted to have bulky mediastinal and axillary lymphadenopathy and was planned for excisional LN biopsy and seen heme Dr. Onalee and Surgery Dr. Rosann at [facility], but patient has deferred scheduleding. His WBC count at time of this recent  elevation was 26.1 but felt reactive per heme. On admission here he has WBC up to 34, lymphocyte predominant, + 16% blasts, + smudge cells. -- Discussed case with Hematology, Delon Hope NP who has reviewed case, as has Dr. Federico on call. Concern for possible ALL and recommending for direct transfer to [facility] considering the patient has recently established with hematology in the [facility] health system.  -- Will initiate direct transfer on behalf of ED as they are less familiar with his case, and we will continue to follow while he is pending transfer to [facility]. At this point will decline admission to [facility] system as he will be better suited at a leukemia center  -- Heme recommended to send for Flow cytometry, LDH, B2 microglobulin, ordered   UGIB  P/w 2 week of melenic stool, reportedly OP labs with Hb 7.7. Repeat Hb here is ~12 and stable on repeat in the ED. Hemodynamically stable. He is on anticoagulation with Warfarin but stopped 2 week ago due to concern for bleeding. INR is 1.  -- EDP had messaged Dr. Shaaron here; will need GI consultation at receiving facility.  -- Keep NPO in case of endoscopy  -- Start Pantoprazole  40 mg IV BID  -- Continue to hold Warfarin (pt stopped 2 weeks ago); INR is 1 -- Serial blood counts   Hypoglycemia  Glucose 60 -> 163 with amp of D50. Likely in setting of sulfonylurea use and decreased intake.  -- Serial CBG monitoring, hypoglycemia protocol   Chronic medical problems:  Atrial fibrillation: See above, holding warfarin. Continue Coreg .  HFrEF: without acute exacerbation. Tentatively will continue his coreg , and digoxin . Will check Digoxin  level. Hold Entresto  and Lasix  in setting of bleed. May need hold on GDMT as well depending on pressures, will monitor.  CAD: Hx of multivessel CAD, evaluated by CT surg in past but declined CABG, also declined high risk PCI per OP cards note. OK to continue on aspirin  given seemingly less significant GI bleed. continue home Zetia ,  Fenofibrate , Pravastatin   Carotid stenosis ,hx LCEA, TIA: noted see CAD re:  antiplt. And cholesterol agents  HTN: See meds in HF above  HLD: see CAD  DM type 2: See hypoglycemia, holding off on SSI for now. Hold his home Glipizide , tradjenta .   Body mass index is 22.6 kg/m.    DVT prophylaxis:  SCDs Code Status:  Full Code; confirmed with patient  Diet:  Diet Orders (From admission, onward)    None      Family Communication:  Yes discussed with his wife at the bedside   Consults:  Hematology, GI   Admission status:   Declined for admission to [facility] system -> Transfer request initiated to [facility]    Severity of Illness: The appropriate patient status for this patient is INPATIENT. Inpatient status is judged to be reasonable and necessary in order to provide the required intensity of service to ensure the patient's safety. The patient's presenting symptoms, physical exam findings, and initial radiographic and laboratory data in the context of their chronic comorbidities is felt to place them at high risk for further clinical deterioration. Furthermore, it is not anticipated that the patient will be medically stable for discharge from the hospital within 2 midnights of admission.   * I certify that at the point of admission it is my clinical judgment that the patient will require inpatient hospital care spanning beyond 2 midnights from the point of admission due to high intensity of service, high risk for further deterioration and high frequency of surveillance required.*   Dorn Dawson, MD [facility]  How to contact the [facility] Attending or Consulting provider 7A - 7P or covering provider during after hours 7P -7A, for this patient.  Check the care team in [facility] and look for a) attending/consulting [facility] provider listed and b) the [facility] team listed Log into [email or web address] and use [facility]'s universal password to access. If you do not have the password, please contact the  hospital operator. Locate the [facility] provider you are looking for under [facility] and page to a number that you can be directly reached. If you still have difficulty reaching the provider, please page the [facility] (Director on Call) for the Hospitalists listed on amion for assistance.  10/24/2023, 1:34 AM

## 2023-10-25 NOTE — Discharge Summary (Addendum)
 Physician Discharge Summary [facility] [address] [place] [address] Dept: [phone] Loc: [phone]   I saw and evaluated the patient, participating in the key portions of the service on the day of discharge.  I reviewed the resident's note and agree with the discharge plans and disposition. I personally spent 35 minutes in discharge planning services. [name], MD   Identifying Information:  Oscar Kozikowski Starr Sr. 1940-03-01 [id number]  Primary Care Physician: Renato Dorothey HERO, NP   Referring Physician: Dorn Dawson   Code Status: Full Code  Admit Date: 10/24/2023  Discharge Date: 10/25/2023   Discharge To: Home  Discharge Service: [facility] - Hematology Res Floor Team ([facility])   Discharge Attending Physician: No att. providers found  Discharge Diagnoses: Principal Problem:   Leukocytosis Active Problems:   Axillary lymphadenopathy   Outpatient Provider Follow Up Issues:  Follow-up Plan after discharge: Issues related to hospitalization:  Discontinue warfarin. Follow up with outpatient provider regarding resumption No blasts/acute leukemia on path review at [facility]. Prelim path more consistent w/ CLL wit hprolymphocytes. Recommend follow up with outpatient ocologist locally. Recommend outpatient LN biopsy of L axillary LN Follow-up appointment with [facility] Oncology: PRN Oncology specific plans going forward: as above    Supportive Care Recommendations:  We recommend based on the patient's underlying diagnosis and treatment history the following supportive care:  Hospital Course:  83 year old male who presented as a transfer from outside hospital for workup of leukocytosis, blasts noted on peripheral smear.  Hospital course by problem below.  Leukocytosis Lymphadenopathy History of leukocytosis, hematology evaluation by Dr. Rogers in 2023 with findings notable for flow cytometry demonstrating a monoclonal B-cell  population.  Lymphadenopathy dating back at least to 5/'20 from CT chest imaging, more recently hospitalized at [facility] in 1/'25 noted to have bulky mediastinal and axillary lymphadenopathy. On admission to OSH, WBC elevated to 34, lymphocyte predominant, + 16% blasts, + smudge cells.  Reviewed by [facility] pathology team, peripheral blood does not show any blasts.  Cells of concern (larger lymphoid cells) look mature. No evidence of blasts by morphology.  Awaiting final pathology report and flow cytometry at time of discharge, but overall picture not consistent with acute leukemia, more consistent with CLL.   Recommend outpatient oncology follow-up and lymph node biopsy.  Melena Reported melena upon presentation to OSH.  Warfarin held.   Recommend continuing to hold warfarin until outpatient follow-up with PCP and cardiology.   Also recommended holding aspirin , fish oil/garlic supplements which may worsen bleeding.  No changes made to medications for chronic medical problems (A-fib, diabetes, hyperlipidemia, CAD, heart failure) except as noted above.  Procedures: None No admission procedures for hospital encounter. ______________________________________________________________________ Discharge Medications:   Your Medication List     PAUSE taking these medications    aspirin  81 MG tablet Wait to take this until your doctor or other care provider tells you to start again. Commonly known as: ECOTRIN Take 1 tablet (81 mg total) by mouth at noon.   fish oil-omega-3 fatty acids 300-1,000 mg capsule Wait to take this until your doctor or other care provider tells you to start again. Take 1 capsule (1 g total) by mouth daily.   GARLIC OIL ORAL Wait to take this until your doctor or other care provider tells you to start again. Take 1 tablet by mouth daily.   warfarin 7.5 MG tablet Wait to take this until your doctor or other care provider tells you to start again. Commonly  known as:  JANTOVEN  Take 1 tablet (7.5 mg total) by mouth every evening.       CONTINUE taking these medications    acetaminophen  325 MG tablet Commonly known as: TYLENOL  Take 2 tablets (650 mg total) by mouth every six (6) hours as needed for pain.   carvedilol  12.5 MG tablet Commonly known as: COREG  Take 1 tablet (12.5 mg total) by mouth two (2) times a day.   digoxin  125 mcg (0.125 mg) tablet Commonly known as: LANOXIN  Take 1 tablet (125 mcg total) by mouth in the morning.   ENTRESTO  24-26 mg tablet Generic drug: sacubitril -valsartan  Take 1 tablet by mouth two (2) times a day.   ezetimibe  10 mg tablet Commonly known as: ZETIA  Take 1 tablet (10 mg total) by mouth at noon.   fenofibrate  160 MG tablet Commonly known as: LOFIBRA Take 1 tablet (160 mg total) by mouth daily.   furosemide  40 MG tablet Commonly known as: LASIX  Take 1 tablet (40 mg total) by mouth daily.   glipiZIDE  10 MG 24 hr tablet Commonly known as: GLUCOTROL  XL Take 1 tablet (10 mg total) by mouth in the morning.   multivitamin with minerals tablet Take 1 tablet by mouth daily.   pantoprazole  40 MG tablet Commonly known as: Protonix  Take 1 tablet (40 mg total) by mouth in the morning.   pravastatin  40 MG tablet Commonly known as: PRAVACHOL  Take 1 tablet (40 mg total) by mouth daily.   TRADJENTA  5 mg Tab Generic drug: linagliptin  Take 1 tablet (5 mg total) by mouth daily.   VITAMIN D3-50 mcg (2,000 unit) 50 mcg (2,000 unit) tablet Generic drug: cholecalciferol (vitamin D3-50 mcg (2,000 unit)) Take 1 tablet (50 mcg total) by mouth daily.        Allergies: Penicillins, Statins-hmg-coa reductase inhibitors, and Spironolactone  ______________________________________________________________________ Pending Test Results (if blank, then none): Pending Labs     Order Current Status   Beta 2 microglobulin, serum In process   Glucose 6 Phosphate Dehydrogenase In process   HIV Antigen/Antibody Combo In  process   HSV Antibody, IgG In process   Hematopathology Order In process   Hepatitis B Core Antibody, IgM In process   Hepatitis B Core Antibody, total In process   Hepatitis B Surface Antibody In process   Hepatitis C Antibody In process       Most Recent Labs: All lab results last 24 hours -  Recent Results (from the past 24 hours)  POCT Glucose   Collection Time: 10/24/23  8:33 PM  Result Value Ref Range   Glucose, POC 168 70 - 179 mg/dL  ABO/RH   Collection Time: 10/25/23  4:34 AM  Result Value Ref Range   ABO Grouping A POS   Lactate dehydrogenase   Collection Time: 10/25/23  4:34 AM  Result Value Ref Range   LDH 184 120 - 246 U/L  Albumin   Collection Time: 10/25/23  4:34 AM  Result Value Ref Range   Albumin 3.3 (L) 3.4 - 5.0 g/dL  PT-INR   Collection Time: 10/25/23  4:34 AM  Result Value Ref Range   PT 12.9 (H) 9.9 - 12.6 sec   INR 1.13   aPTT   Collection Time: 10/25/23  4:34 AM  Result Value Ref Range   APTT 29.4 24.8 - 38.4 sec   Heparin  Correlation 0.2   Fibrinogen   Collection Time: 10/25/23  4:34 AM  Result Value Ref Range   Fibrinogen 235 175 - 500 mg/dL  Basic  Metabolic Panel   Collection Time: 10/25/23  4:34 AM  Result Value Ref Range   Sodium 142 135 - 145 mmol/L   Potassium 4.5 3.4 - 4.8 mmol/L   Chloride 105 98 - 107 mmol/L   CO2 24.0 20.0 - 31.0 mmol/L   Anion Gap 13 5 - 14 mmol/L   BUN 21 9 - 23 mg/dL   Creatinine 8.98 9.26 - 1.18 mg/dL   BUN/Creatinine Ratio 21    eGFR CKD-EPI (2021) Male 73 >=60 mL/min/1.38m2   Glucose 130 70 - 179 mg/dL   Calcium  9.3 8.7 - 10.4 mg/dL  Phosphorus Level   Collection Time: 10/25/23  4:34 AM  Result Value Ref Range   Phosphorus 3.9 2.4 - 5.1 mg/dL  Uric acid   Collection Time: 10/25/23  4:34 AM  Result Value Ref Range   Uric Acid 5.6 3.7 - 9.2 mg/dL  Magnesium  Level   Collection Time: 10/25/23  4:34 AM  Result Value Ref Range   Magnesium  2.2 1.6 - 2.6 mg/dL  CBC w/ Differential   Collection  Time: 10/25/23  4:34 AM  Result Value Ref Range   WBC 31.3 (H) 3.6 - 11.2 10*9/L   RBC 3.98 (L) 4.26 - 5.60 10*12/L   HGB 11.1 (L) 12.9 - 16.5 g/dL   HCT 66.3 (L) 60.9 - 51.9 %   MCV 84.5 77.6 - 95.7 fL   MCH 27.9 25.9 - 32.4 pg   MCHC 33.0 32.0 - 36.0 g/dL   RDW 83.8 (H) 87.7 - 84.7 %   MPV 9.1 6.8 - 10.7 fL   Platelet 316 150 - 450 10*9/L   Neutrophils % 20.5 %   Lymphocytes % 73.8 %   Monocytes % 4.8 %   Eosinophils % 0.5 %   Basophils % 0.4 %   Absolute Neutrophils 6.4 1.8 - 7.8 10*9/L   Absolute Lymphocytes 23.1 (H) 1.1 - 3.6 10*9/L   Absolute Monocytes 1.5 (H) 0.3 - 0.8 10*9/L   Absolute Eosinophils 0.1 0.0 - 0.5 10*9/L   Absolute Basophils 0.1 0.0 - 0.1 10*9/L   Anisocytosis Slight (A) Not Present  Morphology Review   Collection Time: 10/25/23  4:34 AM  Result Value Ref Range   Smear Review Comments See Comment Undefined  Pathologist Smear Review   Collection Time: 10/25/23  4:34 AM  Result Value Ref Range   Pathologist Smear Interpretation  Confirmed by Hemepath Fellow, Confirmed by Hemepath Specialist/Senior Tech, Confirmed by Core Specialist/Senior Tech, To Be Accessioned - Case Report to Follow, Physician Requested Path Review - BF/CSF: cytospin routed to hemepath, Physician Requested...    Physician Requested Path Review - PB:  prepared slide and instrument printout routed to hemepath  Lactate dehydrogenase   Collection Time: 10/25/23  4:35 AM  Result Value Ref Range   LDH 196 120 - 246 U/L  Albumin   Collection Time: 10/25/23  4:35 AM  Result Value Ref Range   Albumin 3.3 (L) 3.4 - 5.0 g/dL  PT-INR   Collection Time: 10/25/23  4:35 AM  Result Value Ref Range   PT 12.7 (H) 9.9 - 12.6 sec   INR 1.11   aPTT   Collection Time: 10/25/23  4:35 AM  Result Value Ref Range   APTT 29.1 24.8 - 38.4 sec   Heparin  Correlation 0.2   Fibrinogen   Collection Time: 10/25/23  4:35 AM  Result Value Ref Range   Fibrinogen 250 175 - 500 mg/dL  POCT Glucose   Collection  Time: 10/25/23  7:10 AM  Result Value Ref Range   Glucose, POC 120 70 - 179 mg/dL  POCT Glucose   Collection Time: 10/25/23 11:06 AM  Result Value Ref Range   Glucose, POC 214 (H) 70 - 179 mg/dL  POCT Glucose   Collection Time: 10/25/23  4:05 PM  Result Value Ref Range   Glucose, POC 153 70 - 179 mg/dL    Relevant Studies/Radiology (if blank, then none): CT Chest Comparison Of Outside Film Result Date: 10/24/2023 These images were imported for comparison purposes only.  They will not be interpreted and no charges will apply.  ______________________________________________________________________ Discharge Instructions:   Activity Instructions     Activity as tolerated           Other Instructions     Call MD for:  difficulty breathing, headache or visual disturbances     Call MD for:  persistent nausea or vomiting     Call MD for:  severe uncontrolled pain     Call MD for:  temperature >38.5 Celsius     Discharge instructions     It was a pleasure taking care of you!  Diagnosis: chronic leukemia.  Our pathologist's review of your peripheral blood smear shows does not show any blasts suggestive of acute leukemia. The type of cells in your blood is more consistent with a chronic, slow-moving, leukemia, such as chronic lymphocytic leukemia (CLL). You should still follow up with an oncologist outpatient and may need treatment at some point in the future, but you do not urgently need to start treatment now.  Course complications: -  N/A  New/Important Medications: - Please hold aspirin , warfarin, fish oil, garlic until follow up with your outpatient team. These may increase risk of bleeding.  Follow up Appointments:  - Please follow up with your primary care provider, cardiologist, and oncologist. - We recommend an outpatient biopsy of your axillary lymph node.  --------------  When to Call Your [facility] Cancer Care Team:  Monday- Friday from 8:00 am - 5:00 pm: Call  [phone] or Toll free [phone].  Ask to speak to the Triage Nurse On Nights, Weekends and Holidays: Call [phone]. Ask the operator to page the Oncology Fellow on Call   RED ZONE:  Take action now! You need to be seen right away. Call 911 or go to your nearest hospital for help.  - Symptoms are at a severe level of discomfort   - Bleeding that will not stop  - Chest Pain   - Hard to breathe    - Fall or passing out   - New Seizure    - Thoughts of hurting yourself or others   YELLOW ZONE: Take action today This is NOT an all-inclusive list. Pleae call with any new or worsening symptoms.  Call your doctor, nurse or other healthcare provider at [phone] You can be seen by a provider the same day through our Same Day Acute Care for Patients with Cancer program.  - Symptoms are new or worsening; You are not within your goal range for:   - Pain          - Swelling (leg, arm, abdomen, face, neck)   - Shortness of breath        - Skin rash or skin changes   - Bleeding (nose, urine, stool, wound)    - Wound issues (redness, drainage, re-opened)   - Feeling sick to your stomach and throwing up    - Confusion   - Mouth sores/pain  in your mouth or throat     - Vision changes  - Hard stool or very loose stools (increase in ostomy   - Fever >100.4 F, chills    Output)        - Worsening cough with mucus that is green, yellow or bloody  - No urine for 12 hours      - Pain or burning when going to the bathroom   - Feeding tube or other catheter/tube issue     - Home infusion pump issue - call [phone]  - Redness or pain at previous IV or port/catheter site   - Depressed or anxiety   GREEN ZONE: You are in control  Your symptoms are under control. Continue to take your medicine as ordered. Keep all visits to the doctor.  - No increase or worsening symptoms  - Able to take your medicine  - Able to drink and eat   For your safety and best care, please DO NOT use MyChart  messages to report red or yellow symptoms.  MyChart messages are only checked during weekday normal business hours and you should receive a  Response within 2 business days.  Please use MyChart only for the follows:  - Non-urgent medication refills, scheduling requests or general questions.       Patient Education:   - Wash your hands routinely with soap and water - Take your temperature when you have chills or are not feeling well - Use a soft toothbrush - Avoid constipation or straining with bowel movements. This may mean you occasionally need to take over-the-counter stool softeners or laxatives.  - Avoid people who have colds or the flu, or are not feeling well. - Wear a mask when visiting crowded places. - Maintain a well-balanced diet and eat healthy foods - Speak with your doctor before having any dental work done - Do only as much activity as you can tolerate  Other instructions: - Don't use dental floss if your platelet count is below 50,000. Your doctor or nurse should tell you if this is the case. - Use any mouthwashes given to you as directed. - If you can't tolerate regular brushing, use an oral swab (bristle-less) toothbrush, or use salt and baking soda to clean your mouth. Mix 1 teaspoon of salt and 1 teaspoon of baking soda into an 8-ounce glass of warm water. Swish and spit. - Watch your mouth and tongue for white patches. This is a sign of fungal infection, a common side effect of chemotherapy. Be sure to tell your doctor about these patches. Medication/mouthwashes can be prescribed to help you fight the fungal infection.       Follow Up instructions and Outpatient Referrals    Call MD for:  difficulty breathing, headache or visual disturbances     Call MD for:  persistent nausea or vomiting     Call MD for:  severe uncontrolled pain     Call MD for:  temperature >38.5 Celsius     Discharge instructions         ______________________________________________________________________ Discharge Day Services: BP 145/62   Pulse 69   Temp 36.5 C (97.7 F) (Oral)   Resp 18   Ht 167.6 cm (5' 6)   Wt 62.1 kg (137 lb)   SpO2 98%   BMI 22.11 kg/m  Pt seen on the day of discharge and determined appropriate for discharge.  Condition at Discharge: good  Length of Discharge: I spent greater than 30 mins in  the discharge of this patient.

## 2023-10-27 ENCOUNTER — Ambulatory Visit: Attending: Student | Admitting: Student

## 2023-10-27 ENCOUNTER — Encounter: Payer: Self-pay | Admitting: Student

## 2023-10-27 VITALS — BP 140/66 | HR 65 | Ht 66.0 in | Wt 143.0 lb

## 2023-10-27 DIAGNOSIS — I5032 Chronic diastolic (congestive) heart failure: Secondary | ICD-10-CM | POA: Diagnosis not present

## 2023-10-27 DIAGNOSIS — I4821 Permanent atrial fibrillation: Secondary | ICD-10-CM

## 2023-10-27 DIAGNOSIS — I2510 Atherosclerotic heart disease of native coronary artery without angina pectoris: Secondary | ICD-10-CM | POA: Diagnosis not present

## 2023-10-27 DIAGNOSIS — D72829 Elevated white blood cell count, unspecified: Secondary | ICD-10-CM

## 2023-10-27 DIAGNOSIS — N1832 Chronic kidney disease, stage 3b: Secondary | ICD-10-CM

## 2023-10-27 DIAGNOSIS — I779 Disorder of arteries and arterioles, unspecified: Secondary | ICD-10-CM

## 2023-10-27 LAB — PATHOLOGIST SMEAR REVIEW

## 2023-10-27 MED ORDER — CARVEDILOL 12.5 MG PO TABS: 12.5000 mg | ORAL_TABLET | Freq: Two times a day (BID) | ORAL | 3 refills | Status: DC

## 2023-10-27 NOTE — Patient Instructions (Signed)
 Medication Instructions:  Your physician recommends that you continue on your current medications as directed. Please refer to the Current Medication list given to you today.  *If you need a refill on your cardiac medications before your next appointment, please call your pharmacy*  Lab Work: NONE   If you have labs (blood work) drawn today and your tests are completely normal, you will receive your results only by: MyChart Message (if you have MyChart) OR A paper copy in the mail If you have any lab test that is abnormal or we need to change your treatment, we will call you to review the results.  Testing/Procedures: NONE   Follow-Up: At [facility], you and your health needs are our priority.  As part of our continuing mission to provide you with exceptional heart care, our providers are all part of one team.  This team includes your primary Cardiologist (physician) and Advanced Practice Providers or APPs (Physician Assistants and Nurse Practitioners) who all work together to provide you with the care you need, when you need it.  Your next appointment:   3 -4  month(s)  Provider:   Woodfin Hays, PA-C    We recommend signing up for the patient portal called MyChart.  Sign up information is provided on this After Visit Summary.  MyChart is used to connect with patients for Virtual Visits (Telemedicine).  Patients are able to view lab/test results, encounter notes, upcoming appointments, etc.  Non-urgent messages can be sent to your provider as well.   To learn more about what you can do with MyChart, go to [email or web address].   Other Instructions Thank you for choosing [facility]!

## 2023-10-28 LAB — CULTURE, BLOOD (ROUTINE X 2)
Culture: NO GROWTH
Culture: NO GROWTH

## 2023-10-28 LAB — BETA 2 MICROGLOBULIN, SERUM: Beta-2 Microglobulin: 3.7 mg/L — ABNORMAL HIGH (ref 0.6–2.4)

## 2023-11-04 NOTE — Progress Notes (Signed)
[facility] at [facility]  HEMATOLOGY NEW VISIT  Oscar Dorothey HERO, NP  REASON FOR REFERRAL: CLL   HISTORY OF PRESENT ILLNESS: Oscar [name] 83 y.o. male referred for CLL. The patient is accompanied by his wife today.   Patient was initially referred to hematology clinic in 2023 and was diagnosed of B-cell lymphocytosis at that time.  He had urine IFE with monoclonal free kappa light chains.  He was recently admitted to [facility] for workup of acute leukemia as he had WBC elevated to 34 and some peripheral blasts seen.  On evaluation by the hematology team there, there were no blasts seen and peripheral smear looked more like CLL.  Patient reports left axillary lymphadenopathy for a few months now that has been growing.He prefers to manage the lymph node conservatively and is reluctant to undergo procedures requiring general anesthesia. Two weeks ago, he experienced an episode of bloody stools, initially thought to have caused a drop in his hemoglobin levels.  No night sweats, fevers, or chills, but he reports some weight loss over the past three weeks. He experiences occasional stomach pain, which can be severe enough to wake him at night.   I have reviewed the past medical history, past surgical history, social history and family history with the patient   ALLERGIES:  is allergic to penicillins, other, statins, penicillin g sodium, spironolactone , and statins support [acid blockers support].  MEDICATIONS:  Current Outpatient Medications  Medication Sig Dispense Refill   Accu-Chek Softclix Lancets lancets SMARTSIG:Topical     acetaminophen  (TYLENOL ) 325 MG tablet Take 2 tablets (650 mg total) by mouth every 6 (six) hours as needed for mild pain (or Fever >/= 101). 30 tablet 0   albuterol  (PROAIR  HFA) 108 (90 Base) MCG/ACT inhaler Inhale 2 puffs into the lungs every 6 (six) hours as needed for wheezing or shortness of breath.     Blood Glucose Monitoring Suppl  (ACCU-CHEK GUIDE ME) w/Device KIT      carvedilol  (COREG ) 12.5 MG tablet Take 1 tablet (12.5 mg total) by mouth 2 (two) times daily with a meal. 180 tablet 3   Cholecalciferol (VITAMIN D3) 10 MCG (400 UNIT) CAPS Take 400 Units by mouth daily.     digoxin  (LANOXIN ) 0.125 MG tablet Take 1 tablet (125 mcg total) by mouth daily. 90 tablet 3   ENTRESTO  24-26 MG TAKE 1 TABLET TWICE A DAY 180 tablet 3   ezetimibe  (ZETIA ) 10 MG tablet Take 10 mg by mouth daily.     fenofibrate  160 MG tablet Take 160 mg by mouth daily.     furosemide  (LASIX ) 40 MG tablet TAKE 1 TABLET DAILY (MAINTENANCE DOSE IS NOW 40 MG DAILY) 30 tablet 0   glipiZIDE  (GLUCOTROL  XL) 10 MG 24 hr tablet TAKE 1 TABLET DAILY WITH BREAKFAST 90 tablet 3   ipratropium (ATROVENT) 0.02 % nebulizer solution Take 2.5 mLs (0.5 mg dose) by nebulization 2 (two) times daily.     linagliptin  (TRADJENTA ) 5 MG TABS tablet Take 1 tablet (5 mg total) by mouth daily. 90 tablet 3   Multiple Vitamin (MULTIVITAMIN) tablet Take 1 tablet by mouth daily.     nitroGLYCERIN  (NITROSTAT ) 0.4 MG SL tablet Place 1 tablet (0.4 mg total) under the tongue every 5 (five) minutes as needed for chest pain. 25 tablet 4   ONETOUCH VERIO test strip      OVER THE COUNTER MEDICATION Over the Counter Beet Chews - patient takes 1 daily in the morning  pantoprazole  (PROTONIX ) 40 MG tablet Take 1 tablet (40 mg total) by mouth daily. 30 tablet 0   pravastatin  (PRAVACHOL ) 40 MG tablet Take 80 mg by mouth daily.      UNABLE TO FIND Beet root     warfarin (COUMADIN ) 7.5 MG tablet Take 1 tablet (7.5 mg total) by mouth daily.     Omega-3 1000 MG CAPS Take 1 g by mouth.     Vitamins-Lipotropics (MEGA MULTIPLE/CHELATED MINERAL) TABS Take 1 tablet by mouth daily.     No current facility-administered medications for this visit.     REVIEW OF SYSTEMS:   Constitutional: Denies fevers, chills or night sweats Eyes: Denies blurriness of vision Ears, nose, mouth, throat, and face: Denies  mucositis or sore throat Respiratory: Denies cough, dyspnea or wheezes Cardiovascular: Denies palpitation, chest discomfort or lower extremity swelling Gastrointestinal:  Denies nausea, heartburn or change in bowel habits Skin: Denies abnormal skin rashes Lymphatics: Denies new lymphadenopathy or easy bruising Neurological:Denies numbness, tingling or new weaknesses Behavioral/Psych: Mood is stable, no new changes  All other systems were reviewed with the patient and are negative.  PHYSICAL EXAMINATION:   Vitals:   11/05/23 1335  BP: (!) 139/47  Pulse: 62  Resp: 19  Temp: 97.6 F (36.4 C)  SpO2: 99%    GENERAL:alert, no distress and comfortable SKIN: skin color, texture, turgor are normal, no rashes or significant lesions EYES: normal, Conjunctiva are pink and non-injected, sclera clear LYMPH:  Left axillary LAD, one LN 4x3 cms, non tender, not matted. No other LAD palpated.  LUNGS: clear to auscultation and percussion with normal breathing effort HEART: regular rate & rhythm and no murmurs and no lower extremity edema ABDOMEN:abdomen soft, non-tender and normal bowel sounds. Musculoskeletal:no cyanosis of digits and no clubbing  NEURO: alert & oriented x 3 with fluent speech, no focal motor/sensory deficits  LABORATORY DATA:  I have reviewed the data as listed  Lab Results  Component Value Date   WBC 22.3 (H) 11/05/2023   NEUTROABS 7.4 11/05/2023   HGB 11.4 (L) 11/05/2023   HCT 35.2 (L) 11/05/2023   MCV 90.3 11/05/2023   PLT 403 (H) 11/05/2023      Chemistry      Component Value Date/Time   NA 140 11/05/2023 1358   NA 135 (A) 06/11/2021 0000   K 4.1 11/05/2023 1358   CL 103 11/05/2023 1358   CO2 24 11/05/2023 1358   BUN 34 (H) 11/05/2023 1358   BUN 33 (A) 06/11/2021 0000   CREATININE 1.08 11/05/2023 1358   CREATININE 1.32 (H) 09/19/2022 0833   GLU 231 06/11/2021 0000      Component Value Date/Time   CALCIUM  9.3 11/05/2023 1358   ALKPHOS 45 11/05/2023  1358   AST 17 11/05/2023 1358   ALT 16 11/05/2023 1358   BILITOT 0.6 11/05/2023 1358       Latest Reference Range & Units 11/05/23 13:58  Iron 45 - 182 ug/dL 39 (L)  UIBC ug/dL 597  TIBC 749 - 549 ug/dL 558  Saturation Ratios 17.9 - 39.5 % 9 (L)  Ferritin 24 - 336 ng/mL 38  Folate >5.9 ng/mL 18.8  Vitamin B12 180 - 914 pg/mL 561  (L): Data is abnormally low  FLOW CYTOMETRY: (10/25/2023): (care everywhere) Diagnosis   Peripheral blood, smear review and flow cytometry -  Involved by CD5+/CD23+, mature B-cell leukemia / lymphoma, representing 48% (15,024 cells/uL) of the peripheral white blood cells by flow cytometric analysis (see Comment)  Comment: This  leukemia / lymphoma has an immunophenotype that is most consistent with chronic lymphocytic leukemia/small lymphocytic lymphoma; however, cytogenetic and / or FISH analysis for t(11;14) is necessary to exclude the less likely possibility of mantle cell lymphoma.      RADIOGRAPHIC STUDIES: I have personally reviewed the radiological images as listed and agreed with the findings in the report.  CT CHEST ABDOMEN PELVIS W CONTRAST CLINICAL DATA:  Anemia, right lower quadrant abdominal pain, melena  EXAM: CT CHEST, ABDOMEN, AND PELVIS WITH CONTRAST  TECHNIQUE: Multidetector CT imaging of the chest, abdomen and pelvis was performed following the standard protocol during bolus administration of intravenous contrast.  RADIATION DOSE REDUCTION: This exam was performed according to the departmental dose-optimization program which includes automated exposure control, adjustment of the mA and/or kV according to patient size and/or use of iterative reconstruction technique.  CONTRAST:  100mL OMNIPAQUE  IOHEXOL  300 MG/ML  SOLN  COMPARISON:  None Available.  FINDINGS: CT CHEST FINDINGS  Cardiovascular: Extensive multi-vessel coronary artery calcification. Global cardiac size isglobally within normal limits though left atrial  enlargement is noted. No pericardial effusion. Central pulmonary arteries are enlarged in keeping with changes of pulmonary arterial hypertension. Extensive atherosclerotic calcification within the thoracic aorta. No aortic aneurysm.  Mediastinum/Nodes: There is pathologic bilateral axillary, mediastinal, and bilateral hilar adenopathy with the index lymph node within the subcarinal lymph node group measuring to 20 mm in short axis diameter. This is nonspecific, but may be reactive, inflammatory as can be seen with conditions such as sarcoidosis, or lymphoproliferative in etiology. Esophagus is unremarkable. Visualized thyroid is unremarkable.  Lungs/Pleura: Mild emphysema. Mild bibasilar pulmonary fibrotic change. No confluent pulmonary infiltrate. Mild scattered parenchymal scarring within the right upper lobe. No pneumothorax or pleural effusion. No central obstructing lesion.  Musculoskeletal: Multiple remote left rib fractures noted. No acute bone abnormality. No lytic or blastic bone lesion.  CT ABDOMEN PELVIS FINDINGS  Hepatobiliary: Cholelithiasis without superimposed pericholecystic inflammatory change. Liver unremarkable. No intra or extrahepatic biliary ductal dilation.  Pancreas: Unremarkable  Spleen: Unremarkable  Adrenals/Urinary Tract: Adrenal glands are unremarkable. Kidneys are normal, without renal calculi, focal lesion, or hydronephrosis. Bladder is unremarkable.  Stomach/Bowel: Severe descending and sigmoid diverticulosis. Stomach, small bowel, and large bowel are otherwise unremarkable. Appendix normal. No evidence of obstruction or focal inflammation. No free intraperitoneal gas or fluid.  Vascular/Lymphatic: Extensive aortoiliac atherosclerotic calcification. Extensive visceral arteriosclerosis. Pathologic bilateral retrocrural adenopathy is present with lymph nodes measuring up to 11 mm in short axis diameter.  Reproductive: Prostate is  unremarkable.  Other: Tiny fat containing right inguinal hernia  Musculoskeletal: No acute or significant osseous findings.  IMPRESSION: 1. No acute intrathoracic or intra-abdominal pathology identified. No definite radiographic explanation for the patient's reported symptoms. 2. Extensive multi-vessel coronary artery calcification. 3. Left atrial enlargement. 4. Morphologic changes in keeping with pulmonary arterial hypertension. 5. Mild emphysema. 6. Pathologic bilateral axillary, mediastinal, bilateral hilar, and retrocrural adenopathy. This appears improved since prior examination of 03/02/2023 may be inflammatory, as can be seen with sarcoidosis, or reflective of a chronic low-grade lymphoproliferative process if there is any history of interval therapy. 7. Cholelithiasis. 8. Severe distal colonic diverticulosis without superimposed acute inflammatory change.  Aortic Atherosclerosis (ICD10-I70.0) and Emphysema (ICD10-J43.9).  Electronically Signed   By: Dorethia Molt M.D.   On: 10/23/2023 23:11   ASSESSMENT & PLAN:  Patient is a 83 y.o. male referred for CLL  Assessment & Plan CLL (chronic lymphocytic leukemia) (HCC) Flow cytometry consistent with CLL.  But t(11;14) not available  to rule out mantle cell lymphoma Recent CT scan with bilateral axillary, mediastinal, bilateral hilar and retrocrural adenopathy.  This was improved from prior.  - Will order biopsy of the axillary lymph node to rule out transformation to DLBCL or any other aggressive lymphoma.  Patient does not want to be put under general anesthesia so ordered IR guided core biopsy of the lymph node - Will obtain CBC, CMP, LDH, uric acid, flow cytometry, CLL FISH, and iron panel tests.  Return to clinic 1 week after biopsy to discuss results Iron deficiency anemia, unspecified iron deficiency anemia type The most likely cause of his anemia is due to chronic blood loss- reported rectal bleeding Lab  Results  Component Value Date   IRON 39 (L) 11/05/2023   TIBC 441 11/05/2023   FERRITIN 38 11/05/2023   TSAT:9 Last colonoscopy/endoscopy: Not interested   -We discussed some of the risks, benefits, and alternatives of intravenous iron infusions. The patient is symptomatic from anemia and the iron level is critically low. He tolerated oral iron supplement poorly and desires to achieve higher levels of iron faster for adequate hematopoesis. Some of the side-effects to be expected including risks of infusion reactions, phlebitis, headaches, nausea and fatigue.  The patient is willing to proceed. Patient education material was dispensed. Goal is to keep ferritin level greater than 50 and resolution of anemia -Start oral iron every other day.  Use MiraLAX for constipation  Repeat labs in 2 months to assess response to IV iron MGUS (monoclonal gammopathy of unknown significance) Patient with known history of MGUS.  No M spike on SPEP previously Normal creatinine.  Very mild anemia.  - Will repeat labs with next blood draw   Orders Placed This Encounter  Procedures   US  CORE BIOPSY (LYMPH NODES)    Surgical pathology, Flow cytometry, CLL FISH    Standing Status:   Future    Expiration Date:   11/04/2024    Lab orders requested (DO NOT place separate lab orders, these will be automatically ordered during procedure specimen collection)::   Surgical Pathology    Reason for Exam (SYMPTOM  OR DIAGNOSIS REQUIRED):   left axillary LN bx    Preferred location?:   [facility]   CBC with Differential    Standing Status:   Future    Number of Occurrences:   1    Expected Date:   11/05/2023    Expiration Date:   01/27/2024   Comprehensive metabolic panel    Standing Status:   Future    Number of Occurrences:   1    Expected Date:   11/05/2023    Expiration Date:   02/04/2024   Uric acid    Standing Status:   Future    Number of Occurrences:   1    Expected Date:   11/05/2023     Expiration Date:   02/02/2024    Release to patient:   Immediate   Lactate dehydrogenase    Standing Status:   Future    Number of Occurrences:   1    Expected Date:   11/05/2023    Expiration Date:   02/10/2024   Iron and TIBC ([facility] ONLY)    Standing Status:   Future    Number of Occurrences:   1    Expected Date:   11/05/2023    Expiration Date:   02/08/2024   Ferritin    Standing Status:   Future    Number  of Occurrences:   1    Expected Date:   11/05/2023    Expiration Date:   02/14/2024   Vitamin B12    Standing Status:   Future    Number of Occurrences:   1    Expected Date:   11/05/2023    Expiration Date:   01/31/2024   Folate    Standing Status:   Future    Number of Occurrences:   1    Expected Date:   11/05/2023    Expiration Date:   02/02/2024   Flow Cytometry, Peripheral Blood (Oncology)    Standing Status:   Future    Number of Occurrences:   1    Expected Date:   11/05/2023    Expiration Date:   02/12/2024   Chronic Lymphocytic Leukemia (CLL) Profile, FISH    Standing Status:   Future    Number of Occurrences:   1    Expected Date:   11/05/2023    Expiration Date:   11/04/2024    The total time spent in the appointment was 60 minutes encounter with patients including review of chart and various tests results, discussions about plan of care and coordination of care plan   All questions were answered. The patient knows to call the clinic with any problems, questions or concerns. No barriers to learning was detected.   Mickiel Dry, MD 9/12/20258:50 AM

## 2023-11-05 ENCOUNTER — Inpatient Hospital Stay

## 2023-11-05 ENCOUNTER — Inpatient Hospital Stay: Attending: Oncology | Admitting: Oncology

## 2023-11-05 VITALS — BP 139/47 | HR 62 | Temp 97.6°F | Resp 19 | Wt 140.4 lb

## 2023-11-05 DIAGNOSIS — C9110 Chronic lymphocytic leukemia of B-cell type not having achieved remission: Secondary | ICD-10-CM | POA: Diagnosis not present

## 2023-11-05 DIAGNOSIS — D72829 Elevated white blood cell count, unspecified: Secondary | ICD-10-CM

## 2023-11-05 DIAGNOSIS — D472 Monoclonal gammopathy: Secondary | ICD-10-CM | POA: Diagnosis not present

## 2023-11-05 DIAGNOSIS — D509 Iron deficiency anemia, unspecified: Secondary | ICD-10-CM | POA: Insufficient documentation

## 2023-11-05 LAB — CBC WITH DIFFERENTIAL/PLATELET
Abs Immature Granulocytes: 0.11 K/uL — ABNORMAL HIGH (ref 0.00–0.07)
Basophils Absolute: 0.1 K/uL (ref 0.0–0.1)
Basophils Relative: 1 %
Eosinophils Absolute: 0.1 K/uL (ref 0.0–0.5)
Eosinophils Relative: 1 %
HCT: 35.2 % — ABNORMAL LOW (ref 39.0–52.0)
Hemoglobin: 11.4 g/dL — ABNORMAL LOW (ref 13.0–17.0)
Immature Granulocytes: 1 %
Lymphocytes Relative: 54 %
Lymphs Abs: 12.2 K/uL — ABNORMAL HIGH (ref 0.7–4.0)
MCH: 29.2 pg (ref 26.0–34.0)
MCHC: 32.4 g/dL (ref 30.0–36.0)
MCV: 90.3 fL (ref 80.0–100.0)
Monocytes Absolute: 2.3 K/uL — ABNORMAL HIGH (ref 0.1–1.0)
Monocytes Relative: 10 %
Neutro Abs: 7.4 K/uL (ref 1.7–7.7)
Neutrophils Relative %: 33 %
Platelets: 403 K/uL — ABNORMAL HIGH (ref 150–400)
RBC: 3.9 MIL/uL — ABNORMAL LOW (ref 4.22–5.81)
RDW: 15.8 % — ABNORMAL HIGH (ref 11.5–15.5)
Smear Review: NORMAL
WBC: 22.3 K/uL — ABNORMAL HIGH (ref 4.0–10.5)
nRBC: 0 % (ref 0.0–0.2)

## 2023-11-05 LAB — FOLATE: Folate: 18.8 ng/mL (ref >5.9)

## 2023-11-05 LAB — COMPREHENSIVE METABOLIC PANEL WITH GFR
ALT: 16 U/L (ref 0–44)
AST: 17 U/L (ref 15–41)
Albumin: 3.5 g/dL (ref 3.5–5.0)
Alkaline Phosphatase: 45 U/L (ref 38–126)
Anion gap: 13 (ref 5–15)
BUN: 34 mg/dL — ABNORMAL HIGH (ref 8–23)
CO2: 24 mmol/L (ref 22–32)
Calcium: 9.3 mg/dL (ref 8.9–10.3)
Chloride: 103 mmol/L (ref 98–111)
Creatinine, Ser: 1.08 mg/dL (ref 0.61–1.24)
GFR, Estimated: >60 mL/min (ref >60)
Glucose, Bld: 181 mg/dL — ABNORMAL HIGH (ref 70–99)
Potassium: 4.1 mmol/L (ref 3.5–5.1)
Sodium: 140 mmol/L (ref 135–145)
Total Bilirubin: 0.6 mg/dL (ref 0.0–1.2)
Total Protein: 6.2 g/dL — ABNORMAL LOW (ref 6.5–8.1)

## 2023-11-05 LAB — IRON AND TIBC
Iron: 39 ug/dL — ABNORMAL LOW (ref 45–182)
Saturation Ratios: 9 % — ABNORMAL LOW (ref 17.9–39.5)
TIBC: 441 ug/dL (ref 250–450)
UIBC: 402 ug/dL

## 2023-11-05 LAB — URIC ACID: Uric Acid, Serum: 4.3 mg/dL (ref 3.7–8.6)

## 2023-11-05 LAB — LACTATE DEHYDROGENASE: LDH: 138 U/L (ref 98–192)

## 2023-11-05 LAB — VITAMIN B12: Vitamin B-12: 561 pg/mL (ref 180–914)

## 2023-11-05 LAB — FERRITIN: Ferritin: 38 ng/mL (ref 24–336)

## 2023-11-05 NOTE — Patient Instructions (Addendum)
 You were seen and examined today by Dr. Davonna. Dr. Davonna is a medical oncologist, meaning that she specializes in the treatment of cancer diagnoses. Dr. Davonna discussed your past medical history, family history of cancers, and the events that led to you being here today.  You have a diagnosis of CLL which is not any worse. You have an enlarged lymph node under your left armpit. Dr. Davonna discussed getting a biopsy of this lymph node to see what we are dealing with.   We will repeat lab work today.   We will see you back after the biopsy of your lymph node.

## 2023-11-06 ENCOUNTER — Encounter (HOSPITAL_COMMUNITY): Payer: Self-pay

## 2023-11-06 ENCOUNTER — Encounter: Payer: Self-pay | Admitting: Oncology

## 2023-11-06 DIAGNOSIS — D472 Monoclonal gammopathy: Secondary | ICD-10-CM | POA: Insufficient documentation

## 2023-11-06 DIAGNOSIS — C9110 Chronic lymphocytic leukemia of B-cell type not having achieved remission: Secondary | ICD-10-CM | POA: Insufficient documentation

## 2023-11-06 NOTE — Assessment & Plan Note (Signed)
 Flow cytometry consistent with CLL.  But t(11;14) not available to rule out mantle cell lymphoma Recent CT scan with bilateral axillary, mediastinal, bilateral hilar and retrocrural adenopathy.  This was improved from prior.  - Will order biopsy of the axillary lymph node to rule out transformation to DLBCL or any other aggressive lymphoma.  Patient does not want to be put under general anesthesia so ordered IR guided core biopsy of the lymph node - Will obtain CBC, CMP, LDH, uric acid, flow cytometry, CLL FISH, and iron panel tests.  Return to clinic 1 week after biopsy to discuss results

## 2023-11-06 NOTE — Assessment & Plan Note (Signed)
 Patient with known history of MGUS.  No M spike on SPEP previously Normal creatinine.  Very mild anemia.  - Will repeat labs with next blood draw

## 2023-11-06 NOTE — Progress Notes (Signed)
 Oscar [name] POUR, MD  [name] Approved for US  guided core biopsy of LEFT AXILLARY LNs.  No sedation  Send for lymphoma workup (flow, CLL FISH)  H[facility]       Previous Messages    ----- Message ----- From: Jariyah Hackley Sent: 11/06/2023   8:39 AM EDT To: Susanne Baumgarner; Ir Procedure Requests Subject: US  core biopsy ( lymph nodes)                  Procedure : US  core Biopsy ( lymph nodes)  Reason: left axillary LN bx Dx: Leukocytosis, unspecified type [I27.170 (ICD-10-CM)]  Ordering Comments  Surgical pathology, Flow cytometry, CLL FISH    History  : CT Chest abd pelv w/  Provider : Davonna Siad, MD  Contact : [phone]

## 2023-11-06 NOTE — Assessment & Plan Note (Signed)
 The most likely cause of his anemia is due to chronic blood loss- reported rectal bleeding Lab Results  Component Value Date   IRON 39 (L) 11/05/2023   TIBC 441 11/05/2023   FERRITIN 38 11/05/2023   TSAT:9 Last colonoscopy/endoscopy: Not interested   -We discussed some of the risks, benefits, and alternatives of intravenous iron infusions. The patient is symptomatic from anemia and the iron level is critically low. He tolerated oral iron supplement poorly and desires to achieve higher levels of iron faster for adequate hematopoesis. Some of the side-effects to be expected including risks of infusion reactions, phlebitis, headaches, nausea and fatigue.  The patient is willing to proceed. Patient education material was dispensed. Goal is to keep ferritin level greater than 50 and resolution of anemia -Start oral iron every other day.  Use MiraLAX for constipation  Repeat labs in 2 months to assess response to IV iron

## 2023-11-09 LAB — SURGICAL PATHOLOGY

## 2023-11-10 LAB — FLOW CYTOMETRY

## 2023-11-13 LAB — FISH HES LEUKEMIA, 4Q12 REA

## 2023-11-17 ENCOUNTER — Other Ambulatory Visit: Payer: Self-pay | Admitting: Cardiology

## 2023-11-27 ENCOUNTER — Emergency Department (HOSPITAL_COMMUNITY)

## 2023-11-27 ENCOUNTER — Encounter (HOSPITAL_COMMUNITY): Payer: Self-pay | Admitting: Emergency Medicine

## 2023-11-27 ENCOUNTER — Other Ambulatory Visit: Payer: Self-pay

## 2023-11-27 ENCOUNTER — Emergency Department (HOSPITAL_COMMUNITY)
Admission: EM | Admit: 2023-11-27 | Discharge: 2023-11-28 | Disposition: A | Discharge status: Home / Self Care | Attending: Emergency Medicine | Admitting: Emergency Medicine

## 2023-11-27 DIAGNOSIS — K5792 Diverticulitis of intestine, part unspecified, without perforation or abscess without bleeding: Secondary | ICD-10-CM | POA: Diagnosis not present

## 2023-11-27 DIAGNOSIS — Z79899 Other long term (current) drug therapy: Secondary | ICD-10-CM | POA: Diagnosis not present

## 2023-11-27 DIAGNOSIS — I4891 Unspecified atrial fibrillation: Secondary | ICD-10-CM | POA: Diagnosis not present

## 2023-11-27 DIAGNOSIS — R339 Retention of urine, unspecified: Secondary | ICD-10-CM | POA: Insufficient documentation

## 2023-11-27 DIAGNOSIS — R109 Unspecified abdominal pain: Secondary | ICD-10-CM

## 2023-11-27 DIAGNOSIS — Z7901 Long term (current) use of anticoagulants: Secondary | ICD-10-CM | POA: Diagnosis not present

## 2023-11-27 LAB — URINALYSIS, ROUTINE W REFLEX MICROSCOPIC
Bilirubin Urine: NEGATIVE
Glucose, UA: 50 mg/dL — AB
Hgb urine dipstick: NEGATIVE
Ketones, ur: NEGATIVE mg/dL
Leukocytes,Ua: NEGATIVE
Nitrite: NEGATIVE
Protein, ur: NEGATIVE mg/dL
Specific Gravity, Urine: 1.01 (ref 1.005–1.030)
pH: 6 (ref 5.0–8.0)

## 2023-11-27 LAB — CBC WITH DIFFERENTIAL/PLATELET
Basophils Absolute: 0 K/uL (ref 0.0–0.1)
Basophils Relative: 0 %
Eosinophils Absolute: 0 K/uL (ref 0.0–0.5)
Eosinophils Relative: 0 %
HCT: 43.1 % (ref 39.0–52.0)
Hemoglobin: 13.8 g/dL (ref 13.0–17.0)
Lymphocytes Relative: 55 %
Lymphs Abs: 11.2 K/uL — ABNORMAL HIGH (ref 0.7–4.0)
MCH: 28.3 pg (ref 26.0–34.0)
MCHC: 32 g/dL (ref 30.0–36.0)
MCV: 88.3 fL (ref 80.0–100.0)
Monocytes Absolute: 1 K/uL (ref 0.1–1.0)
Monocytes Relative: 5 %
Neutro Abs: 8.1 K/uL — ABNORMAL HIGH (ref 1.7–7.7)
Neutrophils Relative %: 40 %
Platelets: 372 K/uL (ref 150–400)
RBC: 4.88 MIL/uL (ref 4.22–5.81)
RDW: 14.9 % (ref 11.5–15.5)
Smear Review: NORMAL
WBC: 20.3 K/uL — ABNORMAL HIGH (ref 4.0–10.5)
nRBC: 0 % (ref 0.0–0.2)

## 2023-11-27 LAB — COMPREHENSIVE METABOLIC PANEL WITH GFR
ALT: 13 U/L (ref 0–44)
AST: 20 U/L (ref 15–41)
Albumin: 4.5 g/dL (ref 3.5–5.0)
Alkaline Phosphatase: 47 U/L (ref 38–126)
Anion gap: 14 (ref 5–15)
BUN: 26 mg/dL — ABNORMAL HIGH (ref 8–23)
CO2: 24 mmol/L (ref 22–32)
Calcium: 9.8 mg/dL (ref 8.9–10.3)
Chloride: 98 mmol/L (ref 98–111)
Creatinine, Ser: 1.18 mg/dL (ref 0.61–1.24)
GFR, Estimated: >60 mL/min (ref >60)
Glucose, Bld: 208 mg/dL — ABNORMAL HIGH (ref 70–99)
Potassium: 4.7 mmol/L (ref 3.5–5.1)
Sodium: 136 mmol/L (ref 135–145)
Total Bilirubin: 0.6 mg/dL (ref 0.0–1.2)
Total Protein: 6.7 g/dL (ref 6.5–8.1)

## 2023-11-27 LAB — PROTIME-INR
INR: 1.3 — ABNORMAL HIGH (ref 0.8–1.2)
Prothrombin Time: 16.4 s — ABNORMAL HIGH (ref 11.4–15.2)

## 2023-11-27 LAB — LACTIC ACID, PLASMA: Lactic Acid, Venous: 1.7 mmol/L (ref 0.5–1.9)

## 2023-11-27 MED ORDER — MOXIFLOXACIN HCL 400 MG PO TABS: 400.0000 mg | ORAL_TABLET | Freq: Every day | ORAL | 0 refills | Status: AC

## 2023-11-27 MED ORDER — HYDROCODONE-ACETAMINOPHEN 5-325 MG PO TABS: 1.0000 | ORAL_TABLET | Freq: Four times a day (QID) | ORAL | 0 refills | Status: DC | PRN

## 2023-11-27 MED ORDER — ONDANSETRON HCL 4 MG/2ML IJ SOLN
4.0000 mg | Freq: Once | INTRAMUSCULAR | Status: AC
  Administered 2023-11-27: 4 mg via INTRAVENOUS
  Filled 2023-11-27: qty 2

## 2023-11-27 MED ORDER — TAMSULOSIN HCL 0.4 MG PO CAPS: 0.4000 mg | ORAL_CAPSULE | Freq: Every day | ORAL | 0 refills | Status: AC

## 2023-11-27 MED ORDER — KETOROLAC TROMETHAMINE 15 MG/ML IJ SOLN: 15.0000 mg | Freq: Once | INTRAMUSCULAR | Status: DC

## 2023-11-27 MED ORDER — IOHEXOL 350 MG/ML SOLN
100.0000 mL | Freq: Once | INTRAVENOUS | Status: AC | PRN
Start: 2023-11-27 — End: 2023-11-27
  Administered 2023-11-27: 100 mL via INTRAVENOUS

## 2023-11-27 MED ORDER — OXYBUTYNIN CHLORIDE ER 10 MG PO TB24: 10.0000 mg | ORAL_TABLET | Freq: Every day | ORAL | 0 refills | Status: AC

## 2023-11-27 MED ORDER — SODIUM CHLORIDE 0.9 % IV BOLUS
500.0000 mL | Freq: Once | INTRAVENOUS | Status: AC
  Administered 2023-11-27: 500 mL via INTRAVENOUS

## 2023-11-27 MED ORDER — KETOROLAC TROMETHAMINE 15 MG/ML IJ SOLN
15.0000 mg | Freq: Once | INTRAMUSCULAR | Status: AC
  Administered 2023-11-27: 15 mg via INTRAVENOUS
  Filled 2023-11-27: qty 1

## 2023-11-27 MED ORDER — MORPHINE SULFATE (PF) 4 MG/ML IV SOLN
2.0000 mg | Freq: Once | INTRAVENOUS | Status: AC
  Administered 2023-11-27: 2 mg via INTRAVENOUS
  Filled 2023-11-27: qty 1

## 2023-11-27 MED ORDER — FENTANYL CITRATE (PF) 100 MCG/2ML IJ SOLN
25.0000 ug | Freq: Once | INTRAMUSCULAR | Status: AC
  Administered 2023-11-27: 25 ug via INTRAVENOUS
  Filled 2023-11-27: qty 2

## 2023-11-27 NOTE — ED Provider Notes (Signed)
 [place] EMERGENCY DEPARTMENT AT [facility] Provider Note   CSN: [id number] Arrival date & time: 11/27/23  1530     Patient presents with: Urinary Retention   Oscar [name] is a 83 y.o. male.   Patient is an 83 year old male who presents emergency department with urinary retention.  Patient notes that he was last able to urinate last night.  He denies any history of similar symptoms in the past but notes he does have a history of kidney stones.  He denies any back or flank pain at this point noting he is only having pain to the suprapubic region.  He has had no associated fever or chills.  He denies any recent falls or blunt abdominal wall trauma.  He denies any history of known BPH.  He has had no recent surgical procedures.        Prior to Admission medications   Medication Sig Start Date End Date Taking? Authorizing Provider  Accu-Chek Softclix Lancets lancets SMARTSIG:Topical 10/30/23   [provider]  acetaminophen  (TYLENOL ) 325 MG tablet Take 2 tablets (650 mg total) by mouth every 6 (six) hours as needed for mild pain (or Fever >/= 101). 08/12/18   Swayze, Ava, DO  albuterol  (PROAIR  HFA) 108 (90 Base) MCG/ACT inhaler Inhale 2 puffs into the lungs every 6 (six) hours as needed for wheezing or shortness of breath. 08/02/18   Hongalgi, Anand D, MD  Blood Glucose Monitoring Suppl (ACCU-CHEK GUIDE ME) w/Device KIT  10/30/23   [provider]  carvedilol  (COREG ) 12.5 MG tablet Take 1 tablet (12.5 mg total) by mouth 2 (two) times daily with a meal. 10/27/23   Strader, [place] M, PA-C  Cholecalciferol (VITAMIN D3) 10 MCG (400 UNIT) CAPS Take 400 Units by mouth daily.    [provider]  digoxin  (LANOXIN ) 0.125 MG tablet TAKE 1 TABLET DAILY 11/17/23   Lavona Agent, MD  ENTRESTO  24-26 MG TAKE 1 TABLET TWICE A DAY 02/03/23   Strader, [place] M, PA-C  ezetimibe  (ZETIA ) 10 MG tablet Take 10 mg by mouth daily.    [provider]  fenofibrate  160  MG tablet Take 160 mg by mouth daily.    [provider]  furosemide  (LASIX ) 40 MG tablet TAKE 1 TABLET DAILY (MAINTENANCE DOSE IS NOW 40 MG DAILY) 09/29/23   Lavona Agent, MD  glipiZIDE  (GLUCOTROL  XL) 10 MG 24 hr tablet TAKE 1 TABLET DAILY WITH BREAKFAST 04/22/23   Therisa Folks J, NP  ipratropium (ATROVENT) 0.02 % nebulizer solution Take 2.5 mLs (0.5 mg dose) by nebulization 2 (two) times daily. 03/30/18   [provider]  linagliptin  (TRADJENTA ) 5 MG TABS tablet Take 1 tablet (5 mg total) by mouth daily. 09/09/23   Therisa Folks PARAS, NP  Multiple Vitamin (MULTIVITAMIN) tablet Take 1 tablet by mouth daily.    [provider]  nitroGLYCERIN  (NITROSTAT ) 0.4 MG SL tablet Place 1 tablet (0.4 mg total) under the tongue every 5 (five) minutes as needed for chest pain. 04/23/22   Lavona Agent, MD  Omega-3 1000 MG CAPS Take 1 g by mouth.    [provider]  [facility] VERIO test strip  11/25/21   [provider]  OVER THE COUNTER MEDICATION Over the Counter Beet Chews - patient takes 1 daily in the morning    [provider]  pantoprazole  (PROTONIX ) 40 MG tablet Take 1 tablet (40 mg total) by mouth daily. 08/13/18   Swayze, Ava, DO  pravastatin  (PRAVACHOL ) 40 MG tablet  Take 80 mg by mouth daily.  07/12/18   [provider]  UNABLE TO FIND Beet root    [provider]  Vitamins-Lipotropics (MEGA MULTIPLE/CHELATED MINERAL) TABS Take 1 tablet by mouth daily.    [provider]  warfarin (COUMADIN ) 7.5 MG tablet Take 1 tablet (7.5 mg total) by mouth daily. 08/03/18   Hongalgi, Anand D, MD    Allergies: Penicillins, Other, Statins, Penicillin g sodium, Spironolactone , and Statins support [acid blockers support]    Review of Systems  Genitourinary:  Positive for decreased urine volume.  All other systems reviewed and are negative.   Updated Vital Signs BP (!) 158/68 (BP Location: Right Arm)   Pulse 70   Temp 97.8 F (36.6 C)  (Oral)   Resp 18   Ht 5' 6 (1.676 m)   Wt 63.5 kg   SpO2 98%   BMI 22.60 kg/m   Physical Exam Vitals and nursing note reviewed.  Constitutional:      General: He is not in acute distress.    Appearance: Normal appearance. He is not ill-appearing.  HENT:     Head: Normocephalic and atraumatic.     Nose: Nose normal.     Mouth/Throat:     Mouth: Mucous membranes are moist.  Eyes:     Extraocular Movements: Extraocular movements intact.     Conjunctiva/sclera: Conjunctivae normal.     Pupils: Pupils are equal, round, and reactive to light.  Cardiovascular:     Rate and Rhythm: Normal rate and regular rhythm.     Pulses: Normal pulses.     Heart sounds: Normal heart sounds. No murmur heard.    No gallop.  Pulmonary:     Effort: Pulmonary effort is normal. No respiratory distress.     Breath sounds: Normal breath sounds. No stridor. No wheezing, rhonchi or rales.  Abdominal:     General: Abdomen is flat. Bowel sounds are normal. There is no distension.     Palpations: Abdomen is soft.     Tenderness: There is no guarding.     Comments: Tender to palpation over suprapubic region  Musculoskeletal:        General: Normal range of motion.     Cervical back: Normal range of motion and neck supple. No rigidity or tenderness.  Skin:    General: Skin is warm and dry.  Neurological:     General: No focal deficit present.     Mental Status: He is alert and oriented to person, place, and time. Mental status is at baseline.  Psychiatric:        Mood and Affect: Mood normal.        Behavior: Behavior normal.        Thought Content: Thought content normal.        Judgment: Judgment normal.     (all labs ordered are listed, but only abnormal results are displayed) Labs Reviewed  URINALYSIS, ROUTINE W REFLEX MICROSCOPIC - Abnormal; Notable for the following components:      Result Value   Glucose, UA 50 (*)    All other components within normal limits  COMPREHENSIVE METABOLIC  PANEL WITH GFR - Abnormal; Notable for the following components:   Glucose, Bld 208 (*)    BUN 26 (*)    All other components within normal limits  CBC WITH DIFFERENTIAL/PLATELET - Abnormal; Notable for the following components:   WBC 20.3 (*)    All other components within normal limits    EKG: None  Radiology: No results found.   Procedures   Medications Ordered in the ED  morphine  (PF) 4 MG/ML injection 2 mg (2 mg Intravenous Given 11/27/23 1745)  ondansetron  (ZOFRAN ) injection 4 mg (4 mg Intravenous Given 11/27/23 1745)                                    Medical Decision Making Patient does remain stable at this time.  Foley catheter was placed given the patient's urinary retention and bladder does appear to be decompressed at this point as he had no urine left on repeat bladder scan.  He does continue to have ongoing abdominal discomfort.  Initial CT scan of the abdomen and pelvis did demonstrate possible inflammatory changes within the bowel.  Will obtain CTA of the abdomen and pelvis.  He does have a history of atrial fibrillation and is currently on warfarin.  Lactic acid and PT/INR has been added as well.  Will sign patient out to Dr. Sheppard Pereyra pending final results and dispo.  Amount and/or Complexity of Data Reviewed Labs: ordered. Radiology: ordered.  Risk Prescription drug management.   =     Final diagnoses:  None    ED Discharge Orders     None          Daralene Lonni BIRCH, PA-C 11/27/23 1917    Pereyra Savant A, DO 12/02/23 1612

## 2023-11-27 NOTE — Discharge Instructions (Addendum)
 It was a pleasure caring for you today in the emergency department.  Please follow a liquid diet as outlined on handout for the next week.  Please follow-up with gastroenterology for colonoscopy in the next 6 weeks.  Please call urology office on Monday to arrange follow-up in the next 2 weeks to potentially have the catheter removed.  Please return to the emergency department for any worsening or worrisome symptoms including but not limited to worsening pain or pain that does not improve, catheter not draining well, blood in your urine, fever, vomiting, etc.

## 2023-11-27 NOTE — ED Triage Notes (Signed)
 Patient coming to ED for evaluation of inability to urinate.  Reports he has a hx of kidney stones and I think my urinary system is all blocked up.  Has not been able to urinate since last night.  C/o pain to bladder.

## 2023-11-27 NOTE — ED Provider Notes (Signed)
 Care continued after PA pending CTA CTA reviewed, does show diverticulitis w/o complication He is not septic, overall is feeling much better Suprapubic pain nearly resolved, feel this is likely bladder spasm. Will start pt on flomax and oxybutynin, have him f/u with uro for TOV Diverticulitis is uncomplicated, given his age and co-morbidities will treat w/ abx, liquid diet  Observation offered, pt prefers to go home Tolerating po, pain well controlled, catheter draining appropriately F/u pcp, uro, gi  Patient in no distress and overall condition is stable. Detailed discussions were had with the patient/guardian regarding current findings, and need for close f/u with PCP or on call doctor. The patient/guardian has been instructed to return immediately if the symptoms worsen in any way for re-evaluation. Patient/guardian verbalized understanding and is in agreement with current care plan. All questions answered prior to discharge.    Elnor Savant A, DO 11/28/23 1541

## 2023-11-28 NOTE — ED Notes (Signed)
 Foley bag switched to leg bag. Went over [employer] with family and patient. Wheeled out to vehicle.

## 2023-12-02 ENCOUNTER — Other Ambulatory Visit: Payer: Self-pay | Admitting: *Deleted

## 2023-12-02 ENCOUNTER — Telehealth: Payer: Self-pay | Admitting: *Deleted

## 2023-12-02 ENCOUNTER — Ambulatory Visit (INDEPENDENT_AMBULATORY_CARE_PROVIDER_SITE_OTHER): Admitting: Gastroenterology

## 2023-12-02 ENCOUNTER — Inpatient Hospital Stay: Attending: Oncology | Admitting: Oncology

## 2023-12-02 VITALS — BP 115/63 | HR 97 | Temp 97.6°F | Ht 66.0 in | Wt 130.9 lb

## 2023-12-02 DIAGNOSIS — R339 Retention of urine, unspecified: Secondary | ICD-10-CM

## 2023-12-02 DIAGNOSIS — R634 Abnormal weight loss: Secondary | ICD-10-CM

## 2023-12-02 DIAGNOSIS — K551 Chronic vascular disorders of intestine: Secondary | ICD-10-CM

## 2023-12-02 NOTE — Progress Notes (Signed)
 Gastroenterology Office Note    Referring Provider: Renato Dorothey HERO, NP Primary Care Physician:  Renato Dorothey HERO, NP  Primary GI: Dr. Lennard previously   Chief Complaint   Chief Complaint  Patient presents with   hospital followup    Pt states he is still having abdominal pain. Pt says he still has catheter in place.      History of Present Illness   Oscar [name] is an 83 y.o. male presenting today at the request of Oscar [name] ED after ED visit 11/27/2023. He has not been seen by our practice before. Medical history includes hx of CLL with upcoming lymph node biopsy, chronic HF, CAD, afib, HTN, HLD, stage 3 CKD, prior melena reported in Aug 2025 and was inpatient at outside facility with recommendations ot hold aspirin  and warfarin but no EGD completed,.   Reports that he was having lower abdominal pain acute onset and he went to the emergency room with difficulty urinating.  A catheter was placed and recommendations to see urology as outpatient, but this has not been arranged yet.  He states that he thought today he was seeing urology at this office, and he thought his catheter would be removed at this time.  He is quite determined to have this catheter removed, and it is difficult to talk with him about anything else regarding his history as he is focused on this  Used to weigh 140, now 130.   On 9/11, weighed 140.   Occasional abdominal pain, vague. Denies pain after eating. Some mild nausea after eating. Has been on liquid diet since the ED. No fever/chills. Notes black stools occasionally, last seen the morning of going to the ED.   BM today, good amount. States last BM Prior to this was on 10/3. Normally has a BM daily. Has been having liquids since 10/3. Appetite waxes and wanes.    Denies any chest pain, shortness of breath. Feels tired. Hungry. Very focused on catheter.   He does not have any family with him today.  He states that his wife's name is Rose, and  she is with his granddaughter on a field trip.  I attempted to call his wife Oscar [name] at the number listed but was unable to reach as this was out of service.  I also attempted to call Oscar [name], who is the granddaughter, but I was unable to reach this with the number provided.  Son had brought him to his appointment and was in the parking lot, the patient did not wish to have him come into the appointment or discuss any concerns.  Patient stated the son does not know the numbers for the contacts.  On the November 27, 2023 visit to the emergency room, a CT angio of the abdomen and pelvis with and without contrast was done that showed high-grade origin stenoses of the celiac artery, the SMA, IMA, and both renal arteries.  Additional findings as noted under imaging.  There was also acute sigmoid diverticulitis with increased stranding of the proximal to mid sigmoid colon but no free air.  Mildly fatty liver also noted.  His lactic acid was normal at 1.7, his white blood cell count was chronically elevated at 20.3, hemoglobin 13.8, platelets 372, LFTs were normal, BUN was elevated at 26, creatinine was 1.18.  Colonoscopy/EGD: unsure, perhaps at [facility] years ago. ?questionable history of polyps.      Past Medical History:  Diagnosis Date   Anemia    Atrial fibrillation (  HCC)    permanent   CAD (coronary artery disease)    a. cath on 07/29/2018 showed 100% Proximal-RCA stenosis, 99% Proximal-LAD, 75% Ost LM, 70% LCx, 40% PDA, and 30% Ost LCx. Declined CABG   Cardiomyopathy    EF 35% in the past--Improved to 55% echo 2/10. Moderately severe mitral regurgitation in the past, now improved,   Carotid artery stenosis    CHF (congestive heart failure) (HCC) 2002   COPD (chronic obstructive pulmonary disease) (HCC)    Coronary artery disease    non obstructive   Diabetes mellitus    Dyslipidemia    History of tobacco abuse    Nephrolithiasis    Tremors of nervous system     Past Surgical History:   Procedure Laterality Date   CAROTID ENDARTERECTOMY Left 10-21-05   cea   CATARACT EXTRACTION W/PHACO Left 09/10/2018   Procedure: CATARACT EXTRACTION PHACO AND INTRAOCULAR LENS PLACEMENT LEFT EYE;  Surgeon: Harrie Agent, MD;  Location: [facility];  Service: Ophthalmology;  Laterality: Left;  left   CATARACT EXTRACTION W/PHACO Right 11/12/2018   Procedure: CATARACT EXTRACTION PHACO AND INTRAOCULAR LENS PLACEMENT RIGHT EYE;  Surgeon: Harrie Agent, MD;  Location: [facility];  Service: Ophthalmology;  Laterality: Right;  right   ENDARTERECTOMY Right 08/11/2018   Procedure: ENDARTERECTOMY CAROTID RIGHT;  Surgeon: Oris Krystal FALCON, MD;  Location[facility] OR;  Service: Vascular;  Laterality: Right;   left carotid endarterectomy     PATCH ANGIOPLASTY Right 08/11/2018   Procedure: PATCH ANGIOPLAST USING HEMASHIELD PLATINUM FINESSE;  Surgeon: Oris Krystal FALCON, MD;  Location: [facility] OR;  Service: Vascular;  Laterality: Right;   RIGHT/LEFT HEART CATH AND CORONARY ANGIOGRAPHY N/A 07/29/2018   Procedure: RIGHT/LEFT HEART CATH AND CORONARY ANGIOGRAPHY;  Surgeon: Cherrie Toribio SAUNDERS, MD;  Location: [facility] INVASIVE CV LAB;  Service: Cardiovascular;  Laterality: N/A;   TONSILLECTOMY      Current Outpatient Medications  Medication Sig Dispense Refill   Accu-Chek Softclix Lancets lancets SMARTSIG:Topical     acetaminophen  (TYLENOL ) 325 MG tablet Take 2 tablets (650 mg total) by mouth every 6 (six) hours as needed for mild pain (or Fever >/= 101). 30 tablet 0   albuterol  (PROAIR  HFA) 108 (90 Base) MCG/ACT inhaler Inhale 2 puffs into the lungs every 6 (six) hours as needed for wheezing or shortness of breath.     Blood Glucose Monitoring Suppl (ACCU-CHEK GUIDE ME) w/Device KIT      carvedilol  (COREG ) 12.5 MG tablet Take 1 tablet (12.5 mg total) by mouth 2 (two) times daily with a meal. 180 tablet 3   Cholecalciferol (VITAMIN D3) 10 MCG (400 UNIT) CAPS Take 400 Units by mouth daily.     digoxin  (LANOXIN ) 0.125 MG tablet TAKE 1 TABLET DAILY  90 tablet 0   ENTRESTO  24-26 MG TAKE 1 TABLET TWICE A DAY 180 tablet 3   ezetimibe  (ZETIA ) 10 MG tablet Take 10 mg by mouth daily.     fenofibrate  160 MG tablet Take 160 mg by mouth daily.     furosemide  (LASIX ) 40 MG tablet TAKE 1 TABLET DAILY (MAINTENANCE DOSE IS NOW 40 MG DAILY) 30 tablet 0   glipiZIDE  (GLUCOTROL  XL) 10 MG 24 hr tablet TAKE 1 TABLET DAILY WITH BREAKFAST 90 tablet 3   HYDROcodone-acetaminophen  (NORCO/VICODIN) 5-325 MG tablet Take 1 tablet by mouth every 6 (six) hours as needed for moderate pain (pain score 4-6). 10 tablet 0   ipratropium (ATROVENT) 0.02 % nebulizer solution Take 2.5 mLs (0.5 mg dose) by nebulization 2 (  two) times daily.     linagliptin  (TRADJENTA ) 5 MG TABS tablet Take 1 tablet (5 mg total) by mouth daily. 90 tablet 3   moxifloxacin (AVELOX) 400 MG tablet Take 1 tablet (400 mg total) by mouth daily at 8 pm for 7 days. 7 tablet 0   Multiple Vitamin (MULTIVITAMIN) tablet Take 1 tablet by mouth daily.     nitroGLYCERIN  (NITROSTAT ) 0.4 MG SL tablet Place 1 tablet (0.4 mg total) under the tongue every 5 (five) minutes as needed for chest pain. 25 tablet 4   Omega-3 1000 MG CAPS Take 1 g by mouth.     ONETOUCH VERIO test strip      OVER THE COUNTER MEDICATION Over the Counter Beet Chews - patient takes 1 daily in the morning     oxybutynin (DITROPAN XL) 10 MG 24 hr tablet Take 1 tablet (10 mg total) by mouth at bedtime for 7 days. 7 tablet 0   pantoprazole  (PROTONIX ) 40 MG tablet Take 1 tablet (40 mg total) by mouth daily. 30 tablet 0   pravastatin  (PRAVACHOL ) 40 MG tablet Take 80 mg by mouth daily.      tamsulosin (FLOMAX) 0.4 MG CAPS capsule Take 1 capsule (0.4 mg total) by mouth daily for 14 days. 14 capsule 0   UNABLE TO FIND Beet root     Vitamins-Lipotropics (MEGA MULTIPLE/CHELATED MINERAL) TABS Take 1 tablet by mouth daily.     warfarin (COUMADIN ) 7.5 MG tablet Take 1 tablet (7.5 mg total) by mouth daily.     No current facility-administered medications for  this visit.    Allergies as of 12/02/2023 - Review Complete 12/02/2023  Allergen Reaction Noted   Penicillins Hives, Other (See Comments), and Rash 08/01/2008   Other Other (See Comments) 06/19/2010   Statins Other (See Comments) 06/19/2010   Penicillin g sodium  01/31/2021   Spironolactone   02/05/2019   Statins support [acid blockers support]  01/31/2021    Family History  Problem Relation Age of Onset   Coronary artery disease Mother    Diabetes Mother    Heart disease Mother        Before age 42 and  CHF   Hypertension Mother    Other Mother        varicose veins   Heart attack Mother    Heart disease Sister    Hypertension Sister    Heart attack Sister    Heart disease Brother    Hyperlipidemia Brother    Hypertension Brother    Other Brother        varicose veins   Heart attack Brother    Heart disease Son 42       Heart Disease before age 23- Open hear surgery   Deep vein thrombosis Son    Heart attack Son    Coronary artery disease Other    Diabetes Daughter    Hypertension Daughter    Other Daughter        varicose veins    Social History   Socioeconomic History   Marital status: Married    Spouse name: Not on file   Number of children: 5   Years of education: 12   Highest education level: Not on file  Occupational History   Occupation: Retired  Tobacco Use   Smoking status: Former    Current packs/day: 0.00    Types: Cigarettes    Quit date: 02/25/2000    Years since quitting: 23.7   Smokeless tobacco: Former  Psychologist, Educational  Use   Vaping status: Never Used  Substance and Sexual Activity   Alcohol use: No   Drug use: No   Sexual activity: Not on file  Other Topics Concern   Not on file  Social History Narrative   Lives at home with his wife.   Right-handed.   3-4 cups caffeine per day.   Social Drivers of Corporate Investment Banker Strain: Low Risk  (02/28/2023)   Received from [facility]   Overall Financial Resource Strain (CARDIA)     Difficulty of Paying Living Expenses: Not very hard  Food Insecurity: Food Insecurity Present (11/05/2023)   Hunger Vital Sign    Worried About Running Out of Food in the Last Year: Sometimes true    Ran Out of Food in the Last Year: Sometimes true  Transportation Needs: No Transportation Needs (11/05/2023)   PRAPARE - Administrator, Civil Service (Medical): No    Lack of Transportation (Non-Medical): No  Physical Activity: Insufficiently Active (02/28/2023)   Received from [facility]   Exercise Vital Sign    On average, how many days per week do you engage in moderate to strenuous exercise (like a brisk walk)?: 1 day    On average, how many minutes do you engage in exercise at this level?: 10 min  Stress: No Stress Concern Present (02/28/2023)   Received from [facility] Occup[facility] - Occupational Stress Questionnaire    Feeling of Stress : Not at all  Social Connections: Socially Integrated (02/28/2023)   Received from [facility]   Social Connection and Isolation Panel    In a typical week, how many times do you talk on the phone with family, friends, or neighbors?: More than three times a week    How often do you get together with friends or relatives?: More than three times a week    How often do you attend church or religious services?: More than 4 times per year    Do you belong to any clubs or organizations such as church groups, unions, fraternal or athletic groups, or school groups?: Yes    How often do you attend meetings of the clubs or organizations you belong to?: More than 4 times per year    Are you married, widowed, divorced, separated, never married, or living with a partner?: Married  Intimate Partner Violence: Not At Risk (11/05/2023)   Humiliation, Afraid, Rape, and Kick questionnaire    Fear of Current or Ex-Partner: No    Emotionally Abused: No    Physically Abused: No    Sexually Abused: No     Review of  Systems   As noted in HPI.   Physical Exam   BP 115/63 (BP Location: Right Arm, Patient Position: Sitting, Cuff Size: Normal)   Pulse 97   Temp 97.6 F (36.4 C) (Oral)   Ht 5' 6 (1.676 m)   Wt 130 lb 14.4 oz (59.4 kg)   BMI 21.13 kg/m  General:   Alert and oriented. Anxious.  Head:  Normocephalic and atraumatic. Eyes:  Without icterus Ears:  Normal auditory acuity. Abdomen:  +BS, soft, non-tender and non-distended. No HSM noted. No guarding or rebound. No masses appreciated. Catheter with clear urine, no clogs, urine free flowing, chaperone present when assessing.  Rectal:  Deferred  Msk:  with kyphosis  Extremities:  Without edema. Neurologic:  Alert and  oriented x4 Assessment   Oscar [name]  is an 83 y.o. male presenting today at the request of [facility] [name] ED after visit for abdominal pain with CT findings of uncomplicated diverticulitis but also additional multiple concerns as noted above, history of melena, and need to establish care.    Visit was difficult today as he had no family with him, and I was unable to reach any of his family despite multiple tries on the numbers provided.  He declined having his son present in the room with him or discussing any information with his son.    With history of urinary retention and catheter placement, he will need to have this in place for a few weeks, and I told him that we would not be able to remove this today and it was very important to follow-up with urology.  We have helped arrange follow-up with urology on October 29.    CTA in ED showed concerns for chronic mesenteric ischemia, although his abdominal pain seems to be multifactoral in the setting of urinary retention and recent diverticulitis findings on CT.  I wonder if this may have been somewhat of an ischemic presentation as well.  He denies outright pain after eating, but as he is an older adult he could be having some atypical presentation and does note mild nausea after  eating.  He does have documented weight loss, which is concerning.  I am referring him to IR for further evaluation as this is most important prior to undergoing in the endoscopic procedures.  History of melena was also reported, and he will need to have an endoscopy once he is cleared from a vascular standpoint.  His hemoglobin is normal.  With history of diverticulitis document on CT scan, if his health permits, would also recommend diagnostic colonoscopy once stabilized.      PLAN   Continue with PPI daily Referral to urology Referral to IR Will continue to attempt to reach his family in order to obtain more information and discuss plan of care Will ultimately need diagnostic colonoscopy and upper endoscopy in the future once above is addressed   Oscar MICAEL Stager, PhD, ANP-BC [facility]

## 2023-12-02 NOTE — Telephone Encounter (Signed)
 Spoke to [place] from [facility] about getting pt seen to have catheter removed. She scheduled pt to be seen in [facility] office on 12/23/23. She states she has placed pt on a recall list that if she has a sooner cancelling at either office in [place] or [facility], she will give to pt a call to get him in sooner. Pt informed of appt and message that they may give him a call to get him in sooner then 12/23/23. Pt verbalized understanding.

## 2023-12-02 NOTE — Patient Instructions (Signed)
 I am reaching out to your wife and have left a message with your daughter to return call. We need a good number to use for your wife and you so we can contact you in the future. Please call our office with this update!  We have referred you to Urology. They will be the ones to remove the catheter. It is important you do not remove this yourself, as you could end up with significant injury.   I will have our staff call with further recommendations once I have spoken to your wife!    It was a pleasure to see you today. I want to create trusting relationships with patients and provide genuine, compassionate, and quality care. I truly value your feedback, so please be on the lookout for a survey regarding your visit with me today. I appreciate your time in completing this!         Therisa MICAEL Stager, PhD, ANP-BC [facility]

## 2023-12-14 ENCOUNTER — Ambulatory Visit: Admitting: Nurse Practitioner

## 2023-12-14 DIAGNOSIS — I10 Essential (primary) hypertension: Secondary | ICD-10-CM

## 2023-12-14 DIAGNOSIS — Z7984 Long term (current) use of oral hypoglycemic drugs: Secondary | ICD-10-CM

## 2023-12-14 DIAGNOSIS — E785 Hyperlipidemia, unspecified: Secondary | ICD-10-CM

## 2023-12-14 DIAGNOSIS — E1122 Type 2 diabetes mellitus with diabetic chronic kidney disease: Secondary | ICD-10-CM

## 2023-12-23 ENCOUNTER — Ambulatory Visit: Admitting: Urology

## 2023-12-23 VITALS — BP 137/69 | HR 73

## 2023-12-23 DIAGNOSIS — R338 Other retention of urine: Secondary | ICD-10-CM

## 2023-12-23 DIAGNOSIS — N138 Other obstructive and reflux uropathy: Secondary | ICD-10-CM

## 2023-12-23 DIAGNOSIS — N401 Enlarged prostate with lower urinary tract symptoms: Secondary | ICD-10-CM | POA: Diagnosis not present

## 2023-12-23 DIAGNOSIS — R339 Retention of urine, unspecified: Secondary | ICD-10-CM

## 2023-12-23 MED ORDER — TAMSULOSIN HCL 0.4 MG PO CAPS: 0.4000 mg | ORAL_CAPSULE | Freq: Every day | ORAL | 11 refills | Status: DC

## 2023-12-23 MED ORDER — CIPROFLOXACIN HCL 500 MG PO TABS
500.0000 mg | ORAL_TABLET | Freq: Once | ORAL | Status: AC
  Administered 2023-12-23: 500 mg via ORAL

## 2023-12-23 NOTE — Progress Notes (Signed)
 Fill and Pull Catheter Removal  Patient is present today for a catheter removal due to urinary retention.  200 ml of sterile water was instilled into the bladder when the patient felt the urge to urinate. 6 ml of water was then drained from the balloon.  A 16 silicon FR foley cath was removed from the bladder no complications were noted . Foley catheter intact and time of removal. Patient as then given some time to void on their own.  Patient can void  50ml on their own after some time.  Patient tolerated well.  One oral prophylactic antibiotic given per MD orders  Performed by: Exie T. CMA  Follow up/ Additional notes: w/ PVR:   Bladder Scan completed today due to reasonof urinary retention   Patient can void prior to the bladder scan. Bladder scan result: 196  Performed By: Exie DASEN. CMA

## 2023-12-23 NOTE — Progress Notes (Signed)
 12/23/2023 10:42 AM   Tanda FORBES Gave 15-Mar-1940 [id number]  Referring provider: Renato Dorothey HERO, NP [phone] [place]  [address] [place],  [place] [address]  Urinary retention   HPI: Mr Purdum is a 83yo here for evaluation of urinary retention. He had a foley placed on 11/27/23. He was also diagnosed with diverticulitis and was treated with antibiotic therapy. IPSS 24 QOL 5 prior to foley placement. He was placed on flomax but he stopped the medication 2 weeks ago. No hematuria. He is having bothersome bladder spasms   PMH: Past Medical History:  Diagnosis Date   Anemia    Atrial fibrillation (HCC)    permanent   CAD (coronary artery disease)    a. cath on 07/29/2018 showed 100% Proximal-RCA stenosis, 99% Proximal-LAD, 75% Ost LM, 70% LCx, 40% PDA, and 30% Ost LCx. Declined CABG   Cardiomyopathy    EF 35% in the past--Improved to 55% echo 2/10. Moderately severe mitral regurgitation in the past, now improved,   Carotid artery stenosis    CHF (congestive heart failure) (HCC) 2002   COPD (chronic obstructive pulmonary disease) (HCC)    Coronary artery disease    non obstructive   Diabetes mellitus    Dyslipidemia    History of tobacco abuse    Nephrolithiasis    Tremors of nervous system     Surgical History: Past Surgical History:  Procedure Laterality Date   CAROTID ENDARTERECTOMY Left 10-21-05   cea   CATARACT EXTRACTION W/PHACO Left 09/10/2018   Procedure: CATARACT EXTRACTION PHACO AND INTRAOCULAR LENS PLACEMENT LEFT EYE;  Surgeon: Harrie Agent, MD;  Location: [facility];  Service: Ophthalmology;  Laterality: Left;  left   CATARACT EXTRACTION W/PHACO Right 11/12/2018   Procedure: CATARACT EXTRACTION PHACO AND INTRAOCULAR LENS PLACEMENT RIGHT EYE;  Surgeon: Harrie Agent, MD;  Location: [facility];  Service: Ophthalmology;  Laterality: Right;  right   ENDARTERECTOMY Right 08/11/2018   Procedure: ENDARTERECTOMY CAROTID RIGHT;  Surgeon: Oris Krystal FALCON, MD;  Location: [facility] OR;  Service:  Vascular;  Laterality: Right;   left carotid endarterectomy     PATCH ANGIOPLASTY Right 08/11/2018   Procedure: PATCH ANGIOPLAST USING HEMASHIELD PLATINUM FINESSE;  Surgeon: Oris Krystal FALCON, MD;  Location: [facility] OR;  Service: Vascular;  Laterality: Right;   RIGHT/LEFT HEART CATH AND CORONARY ANGIOGRAPHY N/A 07/29/2018   Procedure: RIGHT/LEFT HEART CATH AND CORONARY ANGIOGRAPHY;  Surgeon: Cherrie Toribio SAUNDERS, MD;  Location: [facility] INVASIVE CV LAB;  Service: Cardiovascular;  Laterality: N/A;   TONSILLECTOMY      Home Medications:  Allergies as of 12/23/2023       Reactions   Penicillins Hives, Other (See Comments), Rash   Blisters on hands Blisters on hands Blisters on hands Blisters on hands Blisters on hands Blisters on hands   Other Other (See Comments)   Joint and muscle pain and low energy. Joint and muscle pain and low energy.   Statins Other (See Comments)   Joint and muscle pain and low energy.   Penicillin G Sodium    Other reaction(s): hives   Spironolactone     Hyperkalemia Hyperkalemia Hyperkalemia   Statins Support [acid Blockers Support]    Other reaction(s): weakness        Medication List        Accurate as of December 23, 2023 10:42 AM. If you have any questions, ask your nurse or doctor.          Accu-Chek Guide Me w/Device Kit   Accu-Chek Softclix Lancets  lancets SMARTSIG:Topical   acetaminophen  325 MG tablet Commonly known as: TYLENOL  Take 2 tablets (650 mg total) by mouth every 6 (six) hours as needed for mild pain (or Fever >/= 101).   albuterol  108 (90 Base) MCG/ACT inhaler Commonly known as: ProAir  HFA Inhale 2 puffs into the lungs every 6 (six) hours as needed for wheezing or shortness of breath.   carvedilol  12.5 MG tablet Commonly known as: COREG  Take 1 tablet (12.5 mg total) by mouth 2 (two) times daily with a meal.   digoxin  0.125 MG tablet Commonly known as: LANOXIN  TAKE 1 TABLET DAILY   Entresto  24-26 MG Generic drug:  sacubitril -valsartan  TAKE 1 TABLET TWICE A DAY   ezetimibe  10 MG tablet Commonly known as: ZETIA  Take 10 mg by mouth daily.   fenofibrate  160 MG tablet Take 160 mg by mouth daily.   furosemide  40 MG tablet Commonly known as: LASIX  TAKE 1 TABLET DAILY (MAINTENANCE DOSE IS NOW 40 MG DAILY)   glipiZIDE  10 MG 24 hr tablet Commonly known as: GLUCOTROL  XL TAKE 1 TABLET DAILY WITH BREAKFAST   HYDROcodone-acetaminophen  5-325 MG tablet Commonly known as: NORCO/VICODIN Take 1 tablet by mouth every 6 (six) hours as needed for moderate pain (pain score 4-6).   ipratropium 0.02 % nebulizer solution Commonly known as: ATROVENT Take 2.5 mLs (0.5 mg dose) by nebulization 2 (two) times daily.   linagliptin  5 MG Tabs tablet Commonly known as: Tradjenta  Take 1 tablet (5 mg total) by mouth daily.   Mega Multiple/Chelated Mineral Tabs Take 1 tablet by mouth daily.   multivitamin tablet Take 1 tablet by mouth daily.   nitroGLYCERIN  0.4 MG SL tablet Commonly known as: NITROSTAT  Place 1 tablet (0.4 mg total) under the tongue every 5 (five) minutes as needed for chest pain.   Omega-3 1000 MG Caps Take 1 g by mouth.   OneTouch Verio test strip Generic drug: glucose blood   OVER THE COUNTER MEDICATION Over the Counter Beet Chews - patient takes 1 daily in the morning   pantoprazole  40 MG tablet Commonly known as: PROTONIX  Take 1 tablet (40 mg total) by mouth daily.   pravastatin  40 MG tablet Commonly known as: PRAVACHOL  Take 80 mg by mouth daily.   UNABLE TO FIND Beet root   Vitamin D3 10 MCG (400 UNIT) Caps Take 400 Units by mouth daily.   warfarin 7.5 MG tablet Commonly known as: COUMADIN  Take 1 tablet (7.5 mg total) by mouth daily.        Allergies:  Allergies  Allergen Reactions   Penicillins Hives, Other (See Comments) and Rash    Blisters on hands Blisters on hands  Blisters on hands Blisters on hands Blisters on hands Blisters on hands   Other Other (See  Comments)    Joint and muscle pain and low energy. Joint and muscle pain and low energy.   Statins Other (See Comments)    Joint and muscle pain and low energy.   Penicillin G Sodium     Other reaction(s): hives   Spironolactone      Hyperkalemia Hyperkalemia Hyperkalemia   Statins Support [Acid Blockers Support]     Other reaction(s): weakness    Family History: Family History  Problem Relation Age of Onset   Coronary artery disease Mother    Diabetes Mother    Heart disease Mother        Before age 93 and  CHF   Hypertension Mother    Other Mother  varicose veins   Heart attack Mother    Heart disease Sister    Hypertension Sister    Heart attack Sister    Heart disease Brother    Hyperlipidemia Brother    Hypertension Brother    Other Brother        varicose veins   Heart attack Brother    Heart disease Son 11       Heart Disease before age 61- Open hear surgery   Deep vein thrombosis Son    Heart attack Son    Coronary artery disease Other    Diabetes Daughter    Hypertension Daughter    Other Daughter        varicose veins    Social History:  reports that he quit smoking about 23 years ago. His smoking use included cigarettes. He has quit using smokeless tobacco. He reports that he does not drink alcohol and does not use drugs.  ROS: All other review of systems were reviewed and are negative except what is noted above in HPI  Physical Exam: BP 137/69   Pulse 73   Constitutional:  Alert and oriented, No acute distress. HEENT: [place] AT, moist mucus membranes.  Trachea midline, no masses. Cardiovascular: No clubbing, cyanosis, or edema. Respiratory: Normal respiratory effort, no increased work of breathing. GI: Abdomen is soft, nontender, nondistended, no abdominal masses GU: No CVA tenderness.  Lymph: No cervical or inguinal lymphadenopathy. Skin: No rashes, bruises or suspicious lesions. Neurologic: Grossly intact, no focal deficits, moving all 4  extremities. Psychiatric: Normal mood and affect.  Laboratory Data: Lab Results  Component Value Date   WBC 20.3 (H) 11/27/2023   HGB 13.8 11/27/2023   HCT 43.1 11/27/2023   MCV 88.3 11/27/2023   PLT 372 11/27/2023    Lab Results  Component Value Date   CREATININE 1.18 11/27/2023    No results found for: PSA  No results found for: TESTOSTERONE  Lab Results  Component Value Date   HGBA1C 7.6 (A) 05/26/2023    Urinalysis    Component Value Date/Time   COLORURINE YELLOW 11/27/2023 1645   APPEARANCEUR CLEAR 11/27/2023 1645   LABSPEC 1.010 11/27/2023 1645   PHURINE 6.0 11/27/2023 1645   GLUCOSEU 50 (A) 11/27/2023 1645   HGBUR NEGATIVE 11/27/2023 1645   BILIRUBINUR NEGATIVE 11/27/2023 1645   KETONESUR NEGATIVE 11/27/2023 1645   PROTEINUR NEGATIVE 11/27/2023 1645   NITRITE NEGATIVE 11/27/2023 1645   LEUKOCYTESUR NEGATIVE 11/27/2023 1645    No results found for: LABMICR, WBCUA, RBCUA, LABEPIT, MUCUS, BACTERIA  Pertinent Imaging:  No results found for this or any previous visit.  No results found for this or any previous visit.  No results found for this or any previous visit.  No results found for this or any previous visit.  Results for orders placed during the hospital encounter of 07/01/19  US  RENAL  Narrative CLINICAL DATA:  82 year old male with chronic kidney disease.  EXAM: RENAL / URINARY TRACT ULTRASOUND COMPLETE  COMPARISON:  Renal ultrasound dated 01/06/2019.  FINDINGS: Right Kidney:  Renal measurements: 11.1 x 5.0 x 6.0 cm = volume: 174 mL. Normal echogenicity. No hydronephrosis or shadowing stone.  Left Kidney:  Renal measurements: 11.3 x 5.3 x 4.8 cm = volume: 151 mL. Normal echogenicity. No hydronephrosis or shadowing stone.  Bladder:  Appears normal for degree of bladder distention. Bilateral ureteral jets noted.  Other:  None.  IMPRESSION: Unremarkable renal ultrasound.   Electronically Signed By:  Vanetta Chou M.D. On: 07/01/2019  21:32  No results found for this or any previous visit.  No results found for this or any previous visit.  Results for orders placed during the hospital encounter of 11/27/23  CT Renal Stone Study  Narrative CLINICAL DATA:  Difficulty with urination  EXAM: CT ABDOMEN AND PELVIS WITHOUT CONTRAST  TECHNIQUE: Multidetector CT imaging of the abdomen and pelvis was performed following the standard protocol without IV contrast.  RADIATION DOSE REDUCTION: This exam was performed according to the departmental dose-optimization program which includes automated exposure control, adjustment of the mA and/or kV according to patient size and/or use of iterative reconstruction technique.  COMPARISON:  CT 10/23/2023  FINDINGS: Lower chest: Lung bases demonstrate no acute airspace disease. Scarring at the lingula.  Hepatobiliary: Contracted gallbladder with stones. No biliary dilatation. Hepatic arterial calcifications.  Pancreas: Unremarkable. No pancreatic ductal dilatation or surrounding inflammatory changes.  Spleen: Normal in size without focal abnormality.  Adrenals/Urinary Tract: Adrenal glands are stable. Kidneys show no hydronephrosis. Intrarenal vascular calcifications. The bladder contains air and Foley catheter. There is moderate urine in the bladder.  Stomach/Bowel: The stomach is moderately distended. Scattered fluid-filled mildly prominent loops of small bowel without definitive obstruction. Question mild wall thickening of pelvic small bowel loops series 2, image 62. Diverticular disease of the colon without acute wall thickening.  Vascular/Lymphatic: Advanced aortic atherosclerosis. No aneurysm. Multiple small retroperitoneal and mesenteric nodes. Enlarged retrocrural nodes measuring up to 12 mm are again noted.  Reproductive: Negative prostate  Other: Negative for pelvic effusion or free air. Small fat containing left  inguinal hernia  Musculoskeletal: No acute or suspicious osseous abnormality  IMPRESSION: 1. Negative for hydronephrosis or ureteral stone. 2. Foley catheter in the bladder with moderate urine in the bladder, correlate for catheter function. 3. Scattered fluid-filled mildly prominent loops of small bowel without definitive obstruction. Question mild wall thickening of pelvic small bowel loops, possible enteritis or ileus. 4. Diverticular disease of the colon without acute wall thickening. 5. Gallstones. 6. Numerous subcentimeter retroperitoneal and mesenteric nodes. Mildly enlarged retrocrural nodes as was seen on prior CT from August. 7. Aortic atherosclerosis.  Aortic Atherosclerosis (ICD10-I70.0).   Electronically Signed By: Luke Bun M.D. On: 11/27/2023 18:08   Assessment & Plan:    1. Urinary retention (Primary) Voiding trial today failed. We will restart flomax and he will followup in 1 week for a voiding trial  2. Benign prostatic hyperplasia with urinary obstruction Flomax 0.4mg  daily   No follow-ups on file.  Belvie Clara, MD  [facility] [place]

## 2023-12-29 ENCOUNTER — Encounter: Payer: Self-pay | Admitting: Urology

## 2023-12-29 NOTE — Patient Instructions (Signed)

## 2024-01-04 ENCOUNTER — Ambulatory Visit (INDEPENDENT_AMBULATORY_CARE_PROVIDER_SITE_OTHER)

## 2024-01-04 DIAGNOSIS — R339 Retention of urine, unspecified: Secondary | ICD-10-CM | POA: Diagnosis not present

## 2024-01-04 LAB — BLADDER SCAN AMB NON-IMAGING: Scan Result: 0

## 2024-01-04 MED ORDER — CIPROFLOXACIN HCL 500 MG PO TABS: 500.0000 mg | ORAL_TABLET | Freq: Once | ORAL | Status: DC

## 2024-01-04 NOTE — Progress Notes (Cosign Needed Addendum)
 Fill and Pull Catheter Removal  Patient is present today for a catheter removal due to Urinary Retention.  150 ml of sterile water was instilled into the bladder when the patient felt the urge to urinate. 10 ml of water was then drained from the balloon.  A 16 FR foley cath was removed from the bladder no complications were noted .  Per Dr. Sherrilee pt cath can be pulled due to bladder spasm and have pt return at 1 pm for PVR.  Foley catheter intact and time of removal. Patient as then given some time to void on their own.  Patient cannot void  0 ml on their own after some time.  Patient tolerated well.  One oral prophylactic antibiotic given per MD orders  Performed by: Oscar [name], CMA  Follow up/ Additional notes: PT return @ 1 pm PVR   Bladder Scan completed today due to reason urinary retention  Patient can void prior to the bladder scan. Bladder scan result: 0  Performed By: Oscar [name], CMA  Additional notes- Patient is scheduled to follow up with 3 month PVR with MD, McKenzie

## 2024-01-29 ENCOUNTER — Other Ambulatory Visit: Payer: Self-pay | Admitting: Student

## 2024-02-02 ENCOUNTER — Ambulatory Visit

## 2024-02-11 ENCOUNTER — Ambulatory Visit: Admitting: Student

## 2024-02-25 DEATH — deceased

## 2024-04-05 ENCOUNTER — Ambulatory Visit

## 2024-05-04 ENCOUNTER — Ambulatory Visit: Admitting: Urology
